# Patient Record
Sex: Male | Born: 1970 | Race: White | Hispanic: No | Marital: Married | Smoking: Former smoker
Health system: Southern US, Community
[De-identification: ages and names within clinical notes are randomized; demographics above are authoritative.]

## PROBLEM LIST (undated history)

## (undated) DIAGNOSIS — M47819 Spondylosis without myelopathy or radiculopathy, site unspecified: Secondary | ICD-10-CM

## (undated) DIAGNOSIS — J45991 Cough variant asthma: Secondary | ICD-10-CM

## (undated) DIAGNOSIS — K760 Fatty (change of) liver, not elsewhere classified: Secondary | ICD-10-CM

## (undated) DIAGNOSIS — I10 Essential (primary) hypertension: Secondary | ICD-10-CM

## (undated) DIAGNOSIS — L509 Urticaria, unspecified: Secondary | ICD-10-CM

## (undated) DIAGNOSIS — I499 Cardiac arrhythmia, unspecified: Secondary | ICD-10-CM

## (undated) DIAGNOSIS — G4730 Sleep apnea, unspecified: Secondary | ICD-10-CM

## (undated) DIAGNOSIS — K5790 Diverticulosis of intestine, part unspecified, without perforation or abscess without bleeding: Secondary | ICD-10-CM

## (undated) DIAGNOSIS — J189 Pneumonia, unspecified organism: Secondary | ICD-10-CM

## (undated) DIAGNOSIS — K219 Gastro-esophageal reflux disease without esophagitis: Secondary | ICD-10-CM

## (undated) DIAGNOSIS — N419 Inflammatory disease of prostate, unspecified: Secondary | ICD-10-CM

## (undated) DIAGNOSIS — R251 Tremor, unspecified: Secondary | ICD-10-CM

## (undated) DIAGNOSIS — G43909 Migraine, unspecified, not intractable, without status migrainosus: Secondary | ICD-10-CM

## (undated) DIAGNOSIS — J302 Other seasonal allergic rhinitis: Secondary | ICD-10-CM

## (undated) DIAGNOSIS — M1990 Unspecified osteoarthritis, unspecified site: Secondary | ICD-10-CM

## (undated) HISTORY — DX: Spondylosis without myelopathy or radiculopathy, site unspecified: M47.819

## (undated) HISTORY — DX: Other seasonal allergic rhinitis: J30.2

## (undated) HISTORY — DX: Migraine, unspecified, not intractable, without status migrainosus: G43.909

## (undated) HISTORY — DX: Essential (primary) hypertension: I10

## (undated) HISTORY — DX: Cardiac arrhythmia, unspecified: I49.9

## (undated) HISTORY — PX: COLON SURGERY: SHX602

## (undated) HISTORY — DX: Cough variant asthma: J45.991

## (undated) HISTORY — DX: Fatty (change of) liver, not elsewhere classified: K76.0

## (undated) HISTORY — DX: Sleep apnea, unspecified: G47.30

## (undated) HISTORY — DX: Unspecified osteoarthritis, unspecified site: M19.90

## (undated) HISTORY — PX: SINOSCOPY: SHX187

## (undated) HISTORY — DX: Tremor, unspecified: R25.1

## (undated) HISTORY — PX: HERNIA REPAIR: SHX51

## (undated) HISTORY — DX: Urticaria, unspecified: L50.9

## (undated) HISTORY — DX: Inflammatory disease of prostate, unspecified: N41.9

---
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
## 1998-12-29 ENCOUNTER — Emergency Department (HOSPITAL_COMMUNITY): Admission: EM | Admit: 1998-12-29 | Discharge: 1998-12-29 | Payer: Self-pay | Admitting: Emergency Medicine

## 1999-12-23 ENCOUNTER — Ambulatory Visit (HOSPITAL_COMMUNITY): Admission: RE | Admit: 1999-12-23 | Discharge: 1999-12-23 | Payer: Self-pay | Admitting: Emergency Medicine

## 1999-12-23 ENCOUNTER — Encounter: Payer: Self-pay | Admitting: Emergency Medicine

## 2000-06-12 ENCOUNTER — Emergency Department (HOSPITAL_COMMUNITY): Admission: EM | Admit: 2000-06-12 | Discharge: 2000-06-12 | Payer: Self-pay | Admitting: Emergency Medicine

## 2001-01-09 ENCOUNTER — Emergency Department (HOSPITAL_COMMUNITY): Admission: EM | Admit: 2001-01-09 | Discharge: 2001-01-09 | Payer: Self-pay | Admitting: Emergency Medicine

## 2001-01-09 ENCOUNTER — Encounter: Payer: Self-pay | Admitting: Emergency Medicine

## 2002-06-18 ENCOUNTER — Emergency Department (HOSPITAL_COMMUNITY): Admission: EM | Admit: 2002-06-18 | Discharge: 2002-06-19 | Payer: Self-pay | Admitting: Emergency Medicine

## 2004-01-26 ENCOUNTER — Emergency Department (HOSPITAL_COMMUNITY): Admission: EM | Admit: 2004-01-26 | Discharge: 2004-01-26 | Payer: Self-pay | Admitting: Emergency Medicine

## 2004-11-30 ENCOUNTER — Encounter: Admission: RE | Admit: 2004-11-30 | Discharge: 2004-11-30 | Payer: Self-pay | Admitting: Otolaryngology

## 2006-09-29 ENCOUNTER — Encounter: Admission: RE | Admit: 2006-09-29 | Discharge: 2006-09-29 | Payer: Self-pay | Admitting: Neurology

## 2007-01-19 ENCOUNTER — Encounter: Admission: RE | Admit: 2007-01-19 | Discharge: 2007-01-19 | Payer: Self-pay | Admitting: Neurology

## 2007-07-14 ENCOUNTER — Emergency Department (HOSPITAL_COMMUNITY): Admission: EM | Admit: 2007-07-14 | Discharge: 2007-07-14 | Payer: Self-pay | Admitting: Family Medicine

## 2007-10-29 ENCOUNTER — Encounter: Admission: RE | Admit: 2007-10-29 | Discharge: 2007-10-29 | Payer: Self-pay | Admitting: Neurology

## 2007-12-21 HISTORY — PX: ROTATOR CUFF REPAIR: SHX139

## 2007-12-30 ENCOUNTER — Encounter: Admission: RE | Admit: 2007-12-30 | Discharge: 2007-12-30 | Payer: Self-pay | Admitting: Neurology

## 2008-01-11 ENCOUNTER — Emergency Department (HOSPITAL_COMMUNITY): Admission: EM | Admit: 2008-01-11 | Discharge: 2008-01-11 | Payer: Self-pay | Admitting: Family Medicine

## 2008-07-14 ENCOUNTER — Emergency Department (HOSPITAL_COMMUNITY): Admission: EM | Admit: 2008-07-14 | Discharge: 2008-07-15 | Payer: Self-pay | Admitting: Emergency Medicine

## 2008-09-26 ENCOUNTER — Encounter: Payer: Self-pay | Admitting: Cardiology

## 2008-09-29 ENCOUNTER — Emergency Department (HOSPITAL_COMMUNITY): Admission: EM | Admit: 2008-09-29 | Discharge: 2008-09-29 | Payer: Self-pay | Admitting: Family Medicine

## 2009-02-15 ENCOUNTER — Encounter: Admission: RE | Admit: 2009-02-15 | Discharge: 2009-02-15 | Payer: Self-pay | Admitting: Specialist

## 2009-09-29 ENCOUNTER — Encounter: Payer: Self-pay | Admitting: Cardiology

## 2010-04-18 ENCOUNTER — Emergency Department (HOSPITAL_BASED_OUTPATIENT_CLINIC_OR_DEPARTMENT_OTHER): Admission: EM | Admit: 2010-04-18 | Discharge: 2010-04-19 | Payer: Self-pay | Admitting: Emergency Medicine

## 2010-11-13 ENCOUNTER — Emergency Department (HOSPITAL_COMMUNITY): Admission: EM | Admit: 2010-11-13 | Discharge: 2010-11-13 | Payer: Self-pay | Admitting: Family Medicine

## 2011-05-12 ENCOUNTER — Other Ambulatory Visit: Payer: Self-pay | Admitting: Cardiology

## 2011-05-13 ENCOUNTER — Other Ambulatory Visit: Payer: Self-pay | Admitting: *Deleted

## 2011-05-13 MED ORDER — HYDROCHLOROTHIAZIDE 25 MG PO TABS: 25.0000 mg | ORAL_TABLET | Freq: Every day | ORAL | Status: DC

## 2011-05-13 NOTE — Telephone Encounter (Signed)
Refill done.  

## 2011-07-20 ENCOUNTER — Encounter (INDEPENDENT_AMBULATORY_CARE_PROVIDER_SITE_OTHER): Payer: Self-pay | Admitting: Surgery

## 2011-07-26 ENCOUNTER — Ambulatory Visit (INDEPENDENT_AMBULATORY_CARE_PROVIDER_SITE_OTHER): Payer: PRIVATE HEALTH INSURANCE | Admitting: Surgery

## 2011-07-26 ENCOUNTER — Encounter (INDEPENDENT_AMBULATORY_CARE_PROVIDER_SITE_OTHER): Payer: Self-pay | Admitting: Surgery

## 2011-07-26 VITALS — BP 146/104 | HR 72 | Temp 97.8°F | Ht 70.0 in | Wt 218.2 lb

## 2011-07-26 DIAGNOSIS — K429 Umbilical hernia without obstruction or gangrene: Secondary | ICD-10-CM

## 2011-07-26 NOTE — Progress Notes (Signed)
Jordan Buchanan is a 40 y.o. male.    Chief Complaint  Patient presents with  . Umbilical Hernia    HPI HPI This is a 40 year old gentleman who presents with an umbilical hernia. He has had a hernia for approximately 5 years. It is now coming increasingly symptomatic. He reports occasional sharp pain at the umbilicus. He has had no deep abdominal pain. He has had no obstructive symptoms.  Past Medical History  Diagnosis Date  . Umbilical hernia   . Hypertension   . Generalized headaches   . Migraines   . Allergy   . Arthritis   . LVH (left ventricular hypertrophy)   . Spine disorder     Past Surgical History  Procedure Date  . Rotator cuff repair     left    Family History  Problem Relation Age of Onset  . Asthma Father     Social History History  Substance Use Topics  . Smoking status: Former Games developer  . Smokeless tobacco: Not on file  . Alcohol Use: No    No Known Allergies  Current Outpatient Prescriptions  Medication Sig Dispense Refill  . adalimumab (HUMIRA) 40 MG/0.8ML injection Inject 40 mg into the skin every 14 (fourteen) days.        . metoprolol succinate (TOPROL-XL) 25 MG 24 hr tablet Take 25 mg by mouth daily.        . Omeprazole (PRILOSEC PO) Take by mouth daily.        . hydrochlorothiazide 25 MG tablet TAKE 1 TABLET BY MOUTH EVERY MORNING  30 tablet  1    Review of Systems Review of Systems  Constitutional: Negative.   HENT: Negative.   Eyes: Negative.   Respiratory: Negative.   Cardiovascular: Negative.   Gastrointestinal: Negative.   Genitourinary: Negative.   Musculoskeletal: Negative.   Skin: Negative.   Neurological: Negative.   Endo/Heme/Allergies: Negative.   Psychiatric/Behavioral: Negative.     Physical Exam Physical Exam  Constitutional: He is oriented to person, place, and time. He appears well-developed and well-nourished. No distress.  HENT:  Head: Normocephalic and atraumatic.  Right Ear: External ear normal.  Left  Ear: External ear normal.  Nose: Nose normal.  Mouth/Throat: Oropharynx is clear and moist. No oropharyngeal exudate.  Eyes: Conjunctivae are normal. Pupils are equal, round, and reactive to light.  Neck: No tracheal deviation present. No thyromegaly present.  Cardiovascular: Normal rate, regular rhythm, normal heart sounds and intact distal pulses.   No murmur heard. Respiratory: Effort normal and breath sounds normal. No respiratory distress. He has no wheezes.  GI: Soft. Bowel sounds are normal. A hernia is present. Hernia confirmed positive in the ventral area.  Musculoskeletal: Normal range of motion. He exhibits no edema and no tenderness.  Lymphadenopathy:    He has no cervical adenopathy.  Neurological: He is alert and oriented to person, place, and time.  Skin: Skin is warm and dry. No rash noted. No erythema.  Psychiatric: His behavior is normal. Judgment normal.    The hernia at the umbilicus is chronically incarcerated with omentum. It is nontender Blood pressure 146/104, pulse 72, temperature 97.8 F (36.6 C), height 5\' 10"  (1.778 m), weight 218 lb 3.2 oz (98.975 kg).  Assessment/Plan This is a patient with a symptomatic umbilical hernia. Repair is recommended with possible mesh. I discussed this with him in detail. I discussed the risks of surgery which include but is not limited to bleeding, infection, recurrence, need for use of mesh, injury to  surrounding structures, etc. He understands and wishes to proceed.  [name] A 07/26/2011, 4:48 PM

## 2011-08-10 ENCOUNTER — Encounter: Payer: Self-pay | Admitting: Cardiology

## 2011-08-19 ENCOUNTER — Encounter (HOSPITAL_BASED_OUTPATIENT_CLINIC_OR_DEPARTMENT_OTHER): Payer: Self-pay | Admitting: *Deleted

## 2011-08-19 ENCOUNTER — Emergency Department (HOSPITAL_BASED_OUTPATIENT_CLINIC_OR_DEPARTMENT_OTHER)
Admission: EM | Admit: 2011-08-19 | Discharge: 2011-08-19 | Disposition: A | Discharge status: Home / Self Care | Payer: PRIVATE HEALTH INSURANCE | Attending: Emergency Medicine | Admitting: Emergency Medicine

## 2011-08-19 ENCOUNTER — Emergency Department (HOSPITAL_COMMUNITY)
Admission: EM | Admit: 2011-08-19 | Discharge: 2011-08-19 | Discharge status: Against Medical Advice | Payer: PRIVATE HEALTH INSURANCE | Attending: Emergency Medicine | Admitting: Emergency Medicine

## 2011-08-19 DIAGNOSIS — I10 Essential (primary) hypertension: Secondary | ICD-10-CM | POA: Insufficient documentation

## 2011-08-19 DIAGNOSIS — M549 Dorsalgia, unspecified: Secondary | ICD-10-CM | POA: Insufficient documentation

## 2011-08-19 MED ORDER — KETOROLAC TROMETHAMINE 60 MG/2ML IM SOLN
60.0000 mg | Freq: Once | INTRAMUSCULAR | Status: AC
  Administered 2011-08-19: 60 mg via INTRAMUSCULAR
  Filled 2011-08-19: qty 2

## 2011-08-19 MED ORDER — ONDANSETRON HCL 4 MG/2ML IJ SOLN
4.0000 mg | Freq: Once | INTRAMUSCULAR | Status: AC
  Administered 2011-08-19: 4 mg via INTRAMUSCULAR
  Filled 2011-08-19: qty 2

## 2011-08-19 MED ORDER — HYDROMORPHONE HCL 1 MG/ML IJ SOLN
2.0000 mg | Freq: Once | INTRAMUSCULAR | Status: AC
  Administered 2011-08-19: 2 mg via INTRAMUSCULAR
  Filled 2011-08-19: qty 2

## 2011-08-19 MED ORDER — OXYCODONE-ACETAMINOPHEN 5-325 MG PO TABS: 2.0000 | ORAL_TABLET | ORAL | Status: AC | PRN

## 2011-08-19 NOTE — ED Notes (Signed)
Pt with low back pain was seen by PCP last week and started on steroids pain radiates down right leg pt is waiting for a call back for MRI pt reports Vicodin is ineffective

## 2011-08-19 NOTE — ED Provider Notes (Signed)
History     CSN: [id number] Arrival date & time: 08/19/2011  9:01 PM  Chief Complaint  Patient presents with  . Back Pain   Patient is a 40 y.o. male presenting with back pain.  Back Pain  This is a new problem. The current episode started more than 1 week ago. The problem occurs constantly. The problem has not changed since onset.The pain is associated with no known injury. The pain is present in the lumbar spine. The quality of the pain is described as stabbing and shooting. The pain radiates to the right thigh. The pain is at a severity of 8/10. The pain is moderate. The pain is worse during the day. Stiffness is present in the morning. Associated symptoms include leg pain. He has tried nothing for the symptoms. The treatment provided moderate relief.    Past Medical History  Diagnosis Date  . Umbilical hernia   . Hypertension   . Generalized headaches   . Migraines   . Allergy   . Arthritis   . LVH (left ventricular hypertrophy)   . Spine disorder   . Drug abuse     History of previous drug abuse, without recurrence  . Arrhythmia     palpatations    Past Surgical History  Procedure Date  . Rotator cuff repair     left  . Other surgical history 1992    facial surgery    Family History  Problem Relation Age of Onset  . Asthma Father   . Hypertension Brother     History  Substance Use Topics  . Smoking status: Former Smoker    Quit date: 08/09/2001  . Smokeless tobacco: Not on file  . Alcohol Use: No      Review of Systems  Musculoskeletal: Positive for back pain.  All other systems reviewed and are negative.    Physical Exam  BP 135/95  Pulse 77  Temp(Src) 98.8 F (37.1 C) (Oral)  Resp 18  SpO2 97%  Physical Exam  Nursing note and vitals reviewed. Constitutional: He is oriented to person, place, and time. He appears well-developed and well-nourished.  HENT:  Head: Normocephalic and atraumatic.  Eyes: Conjunctivae and EOM are normal. Pupils are  equal, round, and reactive to light.  Neck: Normal range of motion. Neck supple.  Cardiovascular: Normal rate.   Pulmonary/Chest: Effort normal.  Abdominal: Soft.  Musculoskeletal: Normal range of motion.  Neurological: He is alert and oriented to person, place, and time.  Skin: Skin is warm and dry.  Psychiatric: He has a normal mood and affect.    ED Course  Procedures  MDM Pt reports he is suppose to have an mri this week.  Pt reports no relief from hydrocodone.       Langston Masker, [place] 08/20/11 1620

## 2011-09-01 ENCOUNTER — Ambulatory Visit (INDEPENDENT_AMBULATORY_CARE_PROVIDER_SITE_OTHER): Payer: PRIVATE HEALTH INSURANCE | Admitting: Cardiology

## 2011-09-01 ENCOUNTER — Encounter: Payer: Self-pay | Admitting: Cardiology

## 2011-09-01 DIAGNOSIS — I517 Cardiomegaly: Secondary | ICD-10-CM

## 2011-09-01 DIAGNOSIS — I10 Essential (primary) hypertension: Secondary | ICD-10-CM | POA: Insufficient documentation

## 2011-09-01 NOTE — Assessment & Plan Note (Signed)
Resolved by echocardiogram in 2010.

## 2011-09-01 NOTE — Assessment & Plan Note (Signed)
Blood pressures a little elevated today related to use of decongestants. I've cautioned him on avoidance of decongestants. Since he does not have any active cardiac disease I've recommended that he followup with his primary care physician for management of his hypertension. I will see him back as needed.

## 2011-09-01 NOTE — Progress Notes (Signed)
Eliot Ford Date of Birth: 27-Feb-1971   History of Present Illness: This patient is seen for followup evaluation of hypertension. He is a former patient of Dr. Deborah Chalk. He had a prior history of LVH but this resolved with treatment of his hypertension. He has no other cardiac disease. In 2006 he quit smoking in quit drinking alcohol. He was taking hydrochlorothiazide only but was started back on Toprol-XL. Since then he reports his blood pressure has been doing very well. He has had an upper respiratory infection this week and has been taking over-the-counter decongestants. He also has a history of some PVCs but is asymptomatic as long as he avoids caffeine.  Current Outpatient Prescriptions on File Prior to Visit  Medication Sig Dispense Refill  . adalimumab (HUMIRA) 40 MG/0.8ML injection Inject 40 mg into the skin every 14 (fourteen) days.        Marland Kitchen esomeprazole (NEXIUM) 40 MG capsule Take 40 mg by mouth daily.        . hydrochlorothiazide 25 MG tablet TAKE 1 TABLET BY MOUTH EVERY MORNING  30 tablet  1  . HYDROcodone-acetaminophen (VICODIN) 5-500 MG per tablet Take 1 tablet by mouth every 8 (eight) hours as needed. For pain        . metoprolol succinate (TOPROL-XL) 25 MG 24 hr tablet Take 25 mg by mouth daily.        . Multiple Vitamin (MULTI-VITAMIN PO) Take by mouth daily.        . Omega-3 Fatty Acids (FISH OIL) 1200 MG CAPS Take 1 capsule by mouth daily.        . fexofenadine (ALLEGRA) 180 MG tablet Take 180 mg by mouth daily.          No Known Allergies  Past Medical History  Diagnosis Date  . Umbilical hernia   . Hypertension   . Generalized headaches   . Migraines   . Allergy   . Arthritis   . LVH (left ventricular hypertrophy)   . Spine disorder   . Drug abuse     History of previous drug abuse, without recurrence  . Arrhythmia     palpatations  . Ankylosing spondylitis     Past Surgical History  Procedure Date  . Rotator cuff repair     left  . Other surgical  history 1992    facial surgery    History  Smoking status  . Former Smoker  . Quit date: 08/09/2001  Smokeless tobacco  . Not on file    History  Alcohol Use No    Family History  Problem Relation Age of Onset  . Asthma Father   . Hypertension Brother     Review of Systems: As noted in history of present illness.  All other systems were reviewed and are negative.  Physical Exam: BP 142/78  Pulse 90  Ht 5\' 10"  (1.778 m)  Wt 219 lb 3.2 oz (99.428 kg)  BMI 31.45 kg/m2 The patient is alert and oriented x 3.  The mood and affect are normal.  The skin is warm and dry.  Color is normal.  The HEENT exam reveals that the sclera are nonicteric.  The mucous membranes are moist.  The carotids are 2+ without bruits.  There is no thyromegaly.  There is no JVD.  The lungs are clear.  The chest wall is non tender.  The heart exam reveals a regular rate with a normal S1 and S2.  There are no murmurs, gallops, or rubs.  The PMI is not displaced.   Abdominal exam reveals good bowel sounds.  There is no guarding or rebound.  There is no hepatosplenomegaly or tenderness.  There are no masses.  Exam of the legs reveal no clubbing, cyanosis, or edema.  The legs are without rashes.  The distal pulses are intact.  Cranial nerves II - XII are intact.  Motor and sensory functions are intact.  The gait is normal.  LABORATORY DATA:   Assessment / Plan:

## 2011-09-01 NOTE — Patient Instructions (Signed)
Avoid decongestants.  OK to use cough suppressants or antihistamines.  Continue your current mediccations.  I will see you as needed.

## 2011-09-06 NOTE — ED Provider Notes (Signed)
Evaluation and management procedures were performed by the PA/NP under my supervision/collaboration.    Felisa Bonier, MD 09/06/11 1239

## 2011-09-17 LAB — POCT I-STAT, CHEM 8
BUN: 15
Hemoglobin: 15.3
TCO2: 21

## 2011-09-17 LAB — POCT CARDIAC MARKERS
CKMB, poc: <1 — ABNORMAL LOW
Troponin i, poc: <0.05

## 2011-09-20 LAB — POCT URINALYSIS DIP (DEVICE)
Bilirubin Urine: NEGATIVE
Ketones, ur: NEGATIVE
Operator id: 282151
Protein, ur: 30 — AB
Urobilinogen, UA: 1

## 2011-10-04 LAB — POCT RAPID STREP A: Streptococcus, Group A Screen (Direct): POSITIVE — AB

## 2011-12-23 ENCOUNTER — Other Ambulatory Visit: Payer: Self-pay | Admitting: Nurse Practitioner

## 2011-12-23 ENCOUNTER — Ambulatory Visit
Admission: RE | Admit: 2011-12-23 | Discharge: 2011-12-23 | Disposition: A | Discharge status: Home / Self Care | Payer: PRIVATE HEALTH INSURANCE | Source: Ambulatory Visit | Attending: Nurse Practitioner | Admitting: Nurse Practitioner

## 2011-12-23 DIAGNOSIS — R059 Cough, unspecified: Secondary | ICD-10-CM

## 2011-12-23 DIAGNOSIS — R05 Cough: Secondary | ICD-10-CM

## 2011-12-23 DIAGNOSIS — J029 Acute pharyngitis, unspecified: Secondary | ICD-10-CM

## 2012-01-18 ENCOUNTER — Other Ambulatory Visit: Payer: Self-pay | Admitting: *Deleted

## 2012-01-18 MED ORDER — HYDROCHLOROTHIAZIDE 25 MG PO TABS: 25.0000 mg | ORAL_TABLET | Freq: Every day | ORAL | Status: DC

## 2012-04-21 ENCOUNTER — Encounter (INDEPENDENT_AMBULATORY_CARE_PROVIDER_SITE_OTHER): Payer: Self-pay | Admitting: Surgery

## 2012-04-21 ENCOUNTER — Ambulatory Visit (INDEPENDENT_AMBULATORY_CARE_PROVIDER_SITE_OTHER): Payer: PRIVATE HEALTH INSURANCE | Admitting: Surgery

## 2012-04-21 VITALS — BP 130/98 | HR 72 | Temp 98.6°F | Resp 16 | Ht 70.0 in | Wt 218.0 lb

## 2012-04-21 DIAGNOSIS — K429 Umbilical hernia without obstruction or gangrene: Secondary | ICD-10-CM

## 2012-04-21 NOTE — Progress Notes (Signed)
Subjective:     Patient ID: Jordan Buchanan, male   DOB: 1971/07/29, 41 y.o.   MRN: [id number]  HPI This is a gentleman I originally saw in August for an umbilical hernia. We have tentatively schedule him for umbilical hernia repair with mesh but he had canceled surgery. Again, he has had a hernia for some time. He is now rate schedule surgery. It has not changed in size it has caused some slight more discomfort.  Review of Systems     Objective:   Physical Exam    On exam he has an easily reducible umbilical hernia Assessment:     Umbilical hernia    Plan:     Repair with mesh was recommended. I again discussed this with him in detail. I discussed the risk of surgery in detail. These risks includes but is not limited to bleeding, infection, injury to surrounding structures, need to remove the mesh had become infected, recurrence, etc. He understands and wishes to proceed. Likely of success is good

## 2012-05-01 ENCOUNTER — Encounter (INDEPENDENT_AMBULATORY_CARE_PROVIDER_SITE_OTHER): Payer: PRIVATE HEALTH INSURANCE | Admitting: Surgery

## 2012-05-03 ENCOUNTER — Encounter (HOSPITAL_BASED_OUTPATIENT_CLINIC_OR_DEPARTMENT_OTHER): Payer: Self-pay | Admitting: *Deleted

## 2012-05-09 ENCOUNTER — Encounter (HOSPITAL_BASED_OUTPATIENT_CLINIC_OR_DEPARTMENT_OTHER)
Admission: RE | Admit: 2012-05-09 | Discharge: 2012-05-09 | Disposition: A | Discharge status: Home / Self Care | Payer: PRIVATE HEALTH INSURANCE | Source: Ambulatory Visit | Attending: Surgery | Admitting: Surgery

## 2012-05-09 LAB — BASIC METABOLIC PANEL
CO2: 27 mEq/L (ref 19–32)
Calcium: 10 mg/dL (ref 8.4–10.5)
Creatinine, Ser: 0.81 mg/dL (ref 0.50–1.35)
Glucose, Bld: 82 mg/dL (ref 70–99)

## 2012-05-09 NOTE — H&P (Signed)
Jordan Buchanan is a 41 y.o. male.  Chief Complaint   Patient presents with   .  Umbilical Hernia    HPI  HPI  This is a 41 year old gentleman who presents with an umbilical hernia. He has had a hernia for approximately 5 years. It is now coming increasingly symptomatic. He reports occasional sharp pain at the umbilicus. He has had no deep abdominal pain. He has had no obstructive symptoms.  Past Medical History   Diagnosis  Date   .  Umbilical hernia    .  Hypertension    .  Generalized headaches    .  Migraines    .  Allergy    .  Arthritis    .  LVH (left ventricular hypertrophy)    .  Spine disorder     Past Surgical History   Procedure  Date   .  Rotator cuff repair      left    Family History   Problem  Relation  Age of Onset   .  Asthma  Father     Social History  History   Substance Use Topics   .  Smoking status:  Former Games developer   .  Smokeless tobacco:  Not on file   .  Alcohol Use:  No    No Known Allergies  Current Outpatient Prescriptions   Medication  Sig  Dispense  Refill   .  adalimumab (HUMIRA) 40 MG/0.8ML injection  Inject 40 mg into the skin every 14 (fourteen) days.     .  metoprolol succinate (TOPROL-XL) 25 MG 24 hr tablet  Take 25 mg by mouth daily.     .  Omeprazole (PRILOSEC PO)  Take by mouth daily.     .  hydrochlorothiazide 25 MG tablet  TAKE 1 TABLET BY MOUTH EVERY MORNING  30 tablet  1    Review of Systems  Review of Systems  Constitutional: Negative.  HENT: Negative.  Eyes: Negative.  Respiratory: Negative.  Cardiovascular: Negative.  Gastrointestinal: Negative.  Genitourinary: Negative.  Musculoskeletal: Negative.  Skin: Negative.  Neurological: Negative.  Endo/Heme/Allergies: Negative.  Psychiatric/Behavioral: Negative.   Physical Exam  Physical Exam  Constitutional: He is oriented to person, place, and time. He appears well-developed and well-nourished. No distress.  HENT:  Head: Normocephalic and atraumatic.  Right Ear:  External ear normal.  Left Ear: External ear normal.  Nose: Nose normal.  Mouth/Throat: Oropharynx is clear and moist. No oropharyngeal exudate.  Eyes: Conjunctivae are normal. Pupils are equal, round, and reactive to light.  Neck: No tracheal deviation present. No thyromegaly present.  Cardiovascular: Normal rate, regular rhythm, normal heart sounds and intact distal pulses.  No murmur heard.  Respiratory: Effort normal and breath sounds normal. No respiratory distress. He has no wheezes.  GI: Soft. Bowel sounds are normal. A hernia is present. Hernia confirmed positive in the ventral area.  Musculoskeletal: Normal range of motion. He exhibits no edema and no tenderness.  Lymphadenopathy:  He has no cervical adenopathy.  Neurological: He is alert and oriented to person, place, and time.  Skin: Skin is warm and dry. No rash noted. No erythema.  Psychiatric: His behavior is normal. Judgment normal.   The hernia at the umbilicus is chronically incarcerated with omentum. It is nontender  Blood pressure 146/104, pulse 72, temperature 97.8 F (36.6 C), height 5\' 10"  (1.778 m), weight 218 lb 3.2 oz (98.975 kg).  Assessment/Plan  This is a patient with a symptomatic umbilical  hernia. Repair is recommended with possible mesh. I discussed this with him in detail. I discussed the risks of surgery which include but is not limited to bleeding, infection, recurrence, need for use of mesh, injury to surrounding structures, etc. He understands and wishes to proceed.  Deseri Loss A

## 2012-05-10 ENCOUNTER — Ambulatory Visit (HOSPITAL_BASED_OUTPATIENT_CLINIC_OR_DEPARTMENT_OTHER): Payer: PRIVATE HEALTH INSURANCE | Admitting: Certified Registered"

## 2012-05-10 ENCOUNTER — Encounter (HOSPITAL_BASED_OUTPATIENT_CLINIC_OR_DEPARTMENT_OTHER): Payer: Self-pay | Admitting: *Deleted

## 2012-05-10 ENCOUNTER — Encounter (HOSPITAL_BASED_OUTPATIENT_CLINIC_OR_DEPARTMENT_OTHER): Payer: Self-pay | Admitting: Certified Registered"

## 2012-05-10 ENCOUNTER — Encounter (HOSPITAL_BASED_OUTPATIENT_CLINIC_OR_DEPARTMENT_OTHER)
Admission: RE | Disposition: A | Discharge status: Home / Self Care | Payer: Self-pay | Source: Ambulatory Visit | Attending: Surgery

## 2012-05-10 ENCOUNTER — Ambulatory Visit (HOSPITAL_BASED_OUTPATIENT_CLINIC_OR_DEPARTMENT_OTHER)
Admission: RE | Admit: 2012-05-10 | Discharge: 2012-05-10 | Disposition: A | Discharge status: Home / Self Care | Payer: PRIVATE HEALTH INSURANCE | Source: Ambulatory Visit | Attending: Surgery | Admitting: Surgery

## 2012-05-10 DIAGNOSIS — Z01810 Encounter for preprocedural cardiovascular examination: Secondary | ICD-10-CM | POA: Insufficient documentation

## 2012-05-10 DIAGNOSIS — Z01812 Encounter for preprocedural laboratory examination: Secondary | ICD-10-CM | POA: Insufficient documentation

## 2012-05-10 DIAGNOSIS — K429 Umbilical hernia without obstruction or gangrene: Secondary | ICD-10-CM

## 2012-05-10 DIAGNOSIS — I10 Essential (primary) hypertension: Secondary | ICD-10-CM | POA: Insufficient documentation

## 2012-05-10 HISTORY — DX: Gastro-esophageal reflux disease without esophagitis: K21.9

## 2012-05-10 HISTORY — PX: UMBILICAL HERNIA REPAIR: SHX196

## 2012-05-10 LAB — POCT HEMOGLOBIN-HEMACUE: Hemoglobin: 16 g/dL (ref 13.0–17.0)

## 2012-05-10 SURGERY — REPAIR, HERNIA, UMBILICAL, ADULT
Anesthesia: General | Site: Abdomen | Wound class: Clean

## 2012-05-10 MED ORDER — MORPHINE SULFATE 2 MG/ML IJ SOLN: 2.0000 mg | INTRAMUSCULAR | Status: DC | PRN

## 2012-05-10 MED ORDER — FENTANYL CITRATE 0.05 MG/ML IJ SOLN: 25.0000 ug | INTRAMUSCULAR | Status: DC | PRN

## 2012-05-10 MED ORDER — CEFAZOLIN SODIUM-DEXTROSE 2-3 GM-% IV SOLR
2.0000 g | INTRAVENOUS | Status: AC
  Administered 2012-05-10: 2 g via INTRAVENOUS

## 2012-05-10 MED ORDER — SODIUM CHLORIDE 0.9 % IJ SOLN: 3.0000 mL | Freq: Two times a day (BID) | INTRAMUSCULAR | Status: DC

## 2012-05-10 MED ORDER — FENTANYL CITRATE 0.05 MG/ML IJ SOLN
INTRAMUSCULAR | Status: DC | PRN
  Administered 2012-05-10: 100 ug via INTRAVENOUS

## 2012-05-10 MED ORDER — PROPOFOL 10 MG/ML IV EMUL
INTRAVENOUS | Status: DC | PRN
  Administered 2012-05-10: 250 mg via INTRAVENOUS

## 2012-05-10 MED ORDER — SODIUM CHLORIDE 0.9 % IV SOLN: 250.0000 mL | INTRAVENOUS | Status: DC | PRN

## 2012-05-10 MED ORDER — CEFAZOLIN SODIUM 1-5 GM-% IV SOLN: 1.0000 g | INTRAVENOUS | Status: DC

## 2012-05-10 MED ORDER — MIDAZOLAM HCL 5 MG/5ML IJ SOLN
INTRAMUSCULAR | Status: DC | PRN
  Administered 2012-05-10: 2 mg via INTRAVENOUS

## 2012-05-10 MED ORDER — METOCLOPRAMIDE HCL 5 MG/ML IJ SOLN: 10.0000 mg | Freq: Once | INTRAMUSCULAR | Status: DC | PRN

## 2012-05-10 MED ORDER — ONDANSETRON HCL 4 MG/2ML IJ SOLN: 4.0000 mg | Freq: Four times a day (QID) | INTRAMUSCULAR | Status: DC | PRN

## 2012-05-10 MED ORDER — KETOROLAC TROMETHAMINE 30 MG/ML IJ SOLN
INTRAMUSCULAR | Status: DC | PRN
  Administered 2012-05-10: 30 mg via INTRAVENOUS

## 2012-05-10 MED ORDER — MIDAZOLAM HCL 2 MG/2ML IJ SOLN: 0.5000 mg | INTRAMUSCULAR | Status: DC | PRN

## 2012-05-10 MED ORDER — METOCLOPRAMIDE HCL 5 MG/ML IJ SOLN
INTRAMUSCULAR | Status: DC | PRN
  Administered 2012-05-10: 10 mg via INTRAVENOUS

## 2012-05-10 MED ORDER — SODIUM CHLORIDE 0.9 % IJ SOLN: 3.0000 mL | INTRAMUSCULAR | Status: DC | PRN

## 2012-05-10 MED ORDER — ACETAMINOPHEN 650 MG RE SUPP: 650.0000 mg | RECTAL | Status: DC | PRN

## 2012-05-10 MED ORDER — HYDROCODONE-ACETAMINOPHEN 5-325 MG PO TABS: 1.0000 | ORAL_TABLET | ORAL | Status: AC | PRN

## 2012-05-10 MED ORDER — LIDOCAINE HCL (CARDIAC) 20 MG/ML IV SOLN
INTRAVENOUS | Status: DC | PRN
  Administered 2012-05-10: 100 mg via INTRAVENOUS

## 2012-05-10 MED ORDER — BUPIVACAINE HCL 0.5 % IJ SOLN
INTRAMUSCULAR | Status: DC | PRN
  Administered 2012-05-10: 20 mL

## 2012-05-10 MED ORDER — DEXAMETHASONE SODIUM PHOSPHATE 4 MG/ML IJ SOLN
INTRAMUSCULAR | Status: DC | PRN
  Administered 2012-05-10: 4 mg via INTRAVENOUS
  Administered 2012-05-10: 10 mg via INTRAVENOUS

## 2012-05-10 MED ORDER — ACETAMINOPHEN 10 MG/ML IV SOLN
1000.0000 mg | Freq: Once | INTRAVENOUS | Status: AC
  Administered 2012-05-10: 1000 mg via INTRAVENOUS

## 2012-05-10 MED ORDER — ACETAMINOPHEN 325 MG PO TABS: 650.0000 mg | ORAL_TABLET | ORAL | Status: DC | PRN

## 2012-05-10 MED ORDER — OXYCODONE HCL 5 MG PO TABS: 5.0000 mg | ORAL_TABLET | ORAL | Status: DC | PRN

## 2012-05-10 MED ORDER — LACTATED RINGERS IV SOLN
INTRAVENOUS | Status: DC
  Administered 2012-05-10 (×2): via INTRAVENOUS

## 2012-05-10 SURGICAL SUPPLY — 42 items
BENZOIN TINCTURE PRP APPL 2/3 (GAUZE/BANDAGES/DRESSINGS) IMPLANT
BLADE SURG 15 STRL LF DISP TIS (BLADE) ×1 IMPLANT
BLADE SURG 15 STRL SS (BLADE) ×1
BLADE SURG ROTATE 9660 (MISCELLANEOUS) IMPLANT
CANISTER SUCTION 1200CC (MISCELLANEOUS) IMPLANT
CHLORAPREP W/TINT 26ML (MISCELLANEOUS) ×2 IMPLANT
CLEANER CAUTERY TIP 5X5 PAD (MISCELLANEOUS) ×1 IMPLANT
CLOTH BEACON ORANGE TIMEOUT ST (SAFETY) ×2 IMPLANT
COVER MAYO STAND STRL (DRAPES) ×2 IMPLANT
COVER TABLE BACK 60X90 (DRAPES) ×2 IMPLANT
DECANTER SPIKE VIAL GLASS SM (MISCELLANEOUS) IMPLANT
DRAPE PED LAPAROTOMY (DRAPES) ×2 IMPLANT
DRAPE UTILITY XL STRL (DRAPES) ×2 IMPLANT
DRSG TEGADERM 2-3/8X2-3/4 SM (GAUZE/BANDAGES/DRESSINGS) ×2 IMPLANT
DRSG TEGADERM 4X4.75 (GAUZE/BANDAGES/DRESSINGS) IMPLANT
ELECT REM PT RETURN 9FT ADLT (ELECTROSURGICAL) ×2
ELECTRODE REM PT RTRN 9FT ADLT (ELECTROSURGICAL) ×1 IMPLANT
GLOVE SURG SIGNA 7.5 PF LTX (GLOVE) ×2 IMPLANT
GOWN PREVENTION PLUS XLARGE (GOWN DISPOSABLE) IMPLANT
GOWN PREVENTION PLUS XXLARGE (GOWN DISPOSABLE) IMPLANT
NEEDLE HYPO 25X1 1.5 SAFETY (NEEDLE) ×2 IMPLANT
NS IRRIG 1000ML POUR BTL (IV SOLUTION) ×2 IMPLANT
PACK BASIN DAY SURGERY FS (CUSTOM PROCEDURE TRAY) ×2 IMPLANT
PAD CLEANER CAUTERY TIP 5X5 (MISCELLANEOUS) ×1
PATCH VENTRAL SMALL 4.3 (Mesh Specialty) ×2 IMPLANT
PENCIL BUTTON HOLSTER BLD 10FT (ELECTRODE) ×2 IMPLANT
SLEEVE SCD COMPRESS KNEE MED (MISCELLANEOUS) ×2 IMPLANT
SPONGE GAUZE 2X2 8PLY STRL LF (GAUZE/BANDAGES/DRESSINGS) ×2 IMPLANT
SPONGE LAP 4X18 X RAY DECT (DISPOSABLE) ×2 IMPLANT
STRIP CLOSURE SKIN 1/2X4 (GAUZE/BANDAGES/DRESSINGS) ×2 IMPLANT
SUT MNCRL AB 4-0 PS2 18 (SUTURE) ×2 IMPLANT
SUT NOVA NAB DX-16 0-1 5-0 T12 (SUTURE) ×2 IMPLANT
SUT VIC AB 2-0 SH 27 (SUTURE)
SUT VIC AB 2-0 SH 27XBRD (SUTURE) IMPLANT
SUT VIC AB 3-0 SH 27 (SUTURE) ×2
SUT VIC AB 3-0 SH 27X BRD (SUTURE) ×2 IMPLANT
SYR CONTROL 10ML LL (SYRINGE) ×2 IMPLANT
TOWEL OR 17X24 6PK STRL BLUE (TOWEL DISPOSABLE) IMPLANT
TOWEL OR NON WOVEN STRL DISP B (DISPOSABLE) ×2 IMPLANT
TUBE CONNECTING 20X1/4 (TUBING) IMPLANT
WATER STERILE IRR 1000ML POUR (IV SOLUTION) IMPLANT
YANKAUER SUCT BULB TIP NO VENT (SUCTIONS) IMPLANT

## 2012-05-10 NOTE — Anesthesia Procedure Notes (Signed)
Procedure Name: LMA Insertion Date/Time: 05/10/2012 8:33 AM Performed by: Verlan Friends Pre-anesthesia Checklist: Patient identified, Emergency Drugs available, Suction available, Patient being monitored and Timeout performed Patient Re-evaluated:Patient Re-evaluated prior to inductionOxygen Delivery Method: Circle System Utilized Preoxygenation: Pre-oxygenation with 100% oxygen Intubation Type: IV induction Ventilation: Mask ventilation without difficulty LMA: LMA with gastric port inserted LMA Size: 4.0 Number of attempts: 1 Placement Confirmation: positive ETCO2 Tube secured with: Tape Dental Injury: Teeth and Oropharynx as per pre-operative assessment

## 2012-05-10 NOTE — Transfer of Care (Signed)
Immediate Anesthesia Transfer of Care Note  Patient: Jordan Buchanan  Procedure(s) Performed: Procedure(s) (LRB): HERNIA REPAIR UMBILICAL ADULT (N/A) INSERTION OF MESH (N/A)  Patient Location: PACU  Anesthesia Type: General  Level of Consciousness: awake, alert , oriented and patient cooperative  Airway & Oxygen Therapy: Patient Spontanous Breathing and Patient connected to face mask oxygen  Post-op Assessment: Report given to PACU RN and Post -op Vital signs reviewed and stable  Post vital signs: Reviewed and stable  Complications: No apparent anesthesia complications

## 2012-05-10 NOTE — Anesthesia Preprocedure Evaluation (Signed)
Anesthesia Evaluation  Patient identified by MRN, date of birth, ID band Patient awake    Reviewed: Allergy & Precautions, H&P , NPO status , Patient's Chart, lab work & pertinent test results, reviewed documented beta blocker date and time   Airway Mallampati: II TM Distance: >3 FB Neck ROM: full    Dental   Pulmonary neg pulmonary ROS,          Cardiovascular hypertension, On Medications and On Home Beta Blockers + dysrhythmias     Neuro/Psych  Headaches, negative neurological ROS  negative psych ROS   GI/Hepatic Neg liver ROS, GERD-  Medicated and Controlled,(+)     substance abuse   ,   Endo/Other  negative endocrine ROS  Renal/GU negative Renal ROS  negative genitourinary   Musculoskeletal   Abdominal   Peds  Hematology negative hematology ROS (+)   Anesthesia Other Findings See surgeon's H&P   Reproductive/Obstetrics negative OB ROS                           Anesthesia Physical Anesthesia Plan  ASA: III  Anesthesia Plan: General   Post-op Pain Management:    Induction: Intravenous  Airway Management Planned: LMA  Additional Equipment:   Intra-op Plan:   Post-operative Plan: Extubation in OR  Informed Consent: I have reviewed the patients History and Physical, chart, labs and discussed the procedure including the risks, benefits and alternatives for the proposed anesthesia with the patient or authorized representative who has indicated his/her understanding and acceptance.   Dental Advisory Given  Plan Discussed with: CRNA and Surgeon  Anesthesia Plan Comments:         Anesthesia Quick Evaluation

## 2012-05-10 NOTE — Anesthesia Postprocedure Evaluation (Signed)
Anesthesia Post Note  Patient: Jordan Buchanan  Procedure(s) Performed: Procedure(s) (LRB): HERNIA REPAIR UMBILICAL ADULT (N/A) INSERTION OF MESH (N/A)  Anesthesia type: General  Patient location: PACU  Post pain: Pain level controlled  Post assessment: Patient's Cardiovascular Status Stable  Last Vitals:  Filed Vitals:   05/10/12 1000  BP: 119/80  Pulse: 74  Temp: 36.5 C  Resp: 17    Post vital signs: Reviewed and stable  Level of consciousness: alert  Complications: No apparent anesthesia complications

## 2012-05-10 NOTE — Op Note (Signed)
HERNIA REPAIR UMBILICAL ADULT, INSERTION OF MESH  Procedure Note  Javonni Macke 05/10/2012   Pre-op Diagnosis: umbilical hernia     Post-op Diagnosis: same  Procedure(s): HERNIA REPAIR UMBILICAL ADULT INSERTION OF MESH  Surgeon(s): Shelly Rubenstein, MD  Anesthesia: General  Staff:  Joylene Grapes, RN - Circulator Idell Pickles, CST - Scrub Person  Estimated Blood Loss: Minimal               Procedure: The patient was brought to the operating room and identified as the correct patient. He was placed supine on the operating room table and general anesthesia was induced. His abdomen was then prepped and draped in the usual sterile fashion. I anesthetized the skin with Marcaine.  I then made a small infraumbilical transverse incision with the scalpel. I took this down to the hernia sac and fascia with the cautery. I separated the hernia sac from the overlying umbilical skin and excised the sac in its entirety. The omentum that was in the sac it already been reduced. The fascial defect was quite small. I brought a 4.3 cm round patch of mesh onto the field. This was placed through the fascial opening and then pulled up taut to the peritoneum with the stay ties. I then sewed it in place circumferentially with interrupted #1 Novafil pop off sutures. I then closed the fascia over the top of the mesh with a figure-of-eight #1 Novafil suture. Good closure of the fascial defect appeared to be achieved. I anesthetized the fascia with Marcaine. I then closed the subcutaneous tissue with interrupted 3-0 Vicryl sutures and closed the skin with a running 4-0 Monocryl. Steri-Strips, gauze, and Tegaderm were then applied. The patient tolerated the procedure well. All the counts were correct at the end of the procedure. The patient was then extubated in the operating room and taken in a stable condition to the recovery room.          Dorean Hiebert A   Date: 05/10/2012  Time: 9:03 AM

## 2012-05-10 NOTE — Interval H&P Note (Signed)
History and Physical Interval Note:  He has had no change in his history or exam  05/10/2012 7:26 AM  Jordan Buchanan  has presented today for surgery, with the diagnosis of umbilical hernia  The various methods of treatment have been discussed with the patient and family. After consideration of risks, benefits and other options for treatment, the patient has consented to  Procedure(s) (LRB): HERNIA REPAIR UMBILICAL ADULT (N/A) INSERTION OF MESH (N/A) as a surgical intervention .  The patients' history has been reviewed, patient examined, no change in status, stable for surgery.  I have reviewed the patients' chart and labs.  Questions were answered to the patient's satisfaction.     Jordan Buchanan A

## 2012-05-10 NOTE — Discharge Instructions (Signed)
[facility] _______[facility]  UMBILICAL OR INGUINAL HERNIA REPAIR: POST OP INSTRUCTIONS  Always review your discharge instruction sheet given to you by the facility where your surgery was performed. IF YOU HAVE DISABILITY OR FAMILY LEAVE FORMS, YOU MUST BRING THEM TO THE OFFICE FOR PROCESSING.   DO NOT GIVE THEM TO YOUR DOCTOR.  1. A  prescription for pain medication may be given to you upon discharge.  Take your pain medication as prescribed, if needed.  If narcotic pain medicine is not needed, then you may take acetaminophen (Tylenol) or ibuprofen (Advil) as needed. 2. Take your usually prescribed medications unless otherwise directed. 3. If you need a refill on your pain medication, please contact your pharmacy.  They will contact our office to request authorization. Prescriptions will not be filled after 5 pm or on week-ends. 4. You should follow a light diet the first 24 hours after arrival home, such as soup and crackers, etc.  Be sure to include lots of fluids daily.  Resume your normal diet the day after surgery. 5. Most patients will experience some swelling and bruising around the umbilicus or in the groin and scrotum.  Ice packs and reclining will help.  Swelling and bruising can take several days to resolve.  6. It is common to experience some constipation if taking pain medication after surgery.  Increasing fluid intake and taking a stool softener (such as Colace) will usually help or prevent this problem from occurring.  A mild laxative (Milk of Magnesia or Miralax) should be taken according to package directions if there are no bowel movements after 48 hours. 7. Unless discharge instructions indicate otherwise, you may remove your bandages 24-48 hours after surgery, and you may shower at that time.  You may have steri-strips (small skin tapes) in place directly over the incision.  These strips should be left on the skin for 7-10 days.  If your surgeon used skin glue on the  incision, you may shower in 24 hours.  The glue will flake off over the next 2-3 weeks.  Any sutures or staples will be removed at the office during your follow-up visit. 8. ACTIVITIES:  You may resume regular (light) daily activities beginning the next day--such as daily self-care, walking, climbing stairs--gradually increasing activities as tolerated.  You may have sexual intercourse when it is comfortable.  Refrain from any heavy lifting or straining until approved by your doctor. a. You may drive when you are no longer taking prescription pain medication, you can comfortably wear a seatbelt, and you can safely maneuver your car and apply brakes. b. RETURN TO WORK:  __________________________________________________________ 9. You should see your doctor in the office for a follow-up appointment approximately 2-3 weeks after your surgery.  Make sure that you call for this appointment within a day or two after you arrive home to insure a convenient appointment time. 10. OTHER INSTRUCTIONS: NO LIFTING MORE THAN 20 POUNDS FOR 4 WEEKS 11. ICE PACK AND IBUPROFEN AS NEEDED FOR PAIN __________________________________________________________________________________________________________________________________________________________________________________________  WHEN TO CALL YOUR DOCTOR: 1. Fever over 101.0 2. Inability to urinate 3. Nausea and/or vomiting 4. Extreme swelling or bruising 5. Continued bleeding from incision. 6. Increased pain, redness, or drainage from the incision  The clinic staff is available to answer your questions during regular business hours.  Please don't hesitate to call and ask to speak to one of the nurses for clinical concerns.  If you have a medical emergency, go to the nearest emergency room or call  911.  A surgeon from [facility] is always on call at the hospital   [address], [address], [place], [place]  [address] ?  [address] [address],  [place], [place]   [address] [phone] ? [phone] ? FAX [phone] Web site: [email or web address]  Post Anesthesia Home Care Instructions  Activity: Get plenty of rest for the remainder of the day. A responsible adult should stay with you for 24 hours following the procedure.  For the next 24 hours, DO NOT: -Drive a car -Advertising copywriter -Drink alcoholic beverages -Take any medication unless instructed by your physician -Make any legal decisions or sign important papers.  Meals: Start with liquid foods such as gelatin or soup. Progress to regular foods as tolerated. Avoid greasy, spicy, heavy foods. If nausea and/or vomiting occur, drink only clear liquids until the nausea and/or vomiting subsides. Call your physician if vomiting continues.  Special Instructions/Symptoms: Your throat may feel dry or sore from the anesthesia or the breathing tube placed in your throat during surgery. If this causes discomfort, gargle with warm salt water. The discomfort should disappear within 24 hours.

## 2012-05-11 ENCOUNTER — Encounter (HOSPITAL_BASED_OUTPATIENT_CLINIC_OR_DEPARTMENT_OTHER): Payer: Self-pay | Admitting: Surgery

## 2012-05-25 ENCOUNTER — Ambulatory Visit (INDEPENDENT_AMBULATORY_CARE_PROVIDER_SITE_OTHER): Payer: PRIVATE HEALTH INSURANCE | Admitting: Surgery

## 2012-05-25 ENCOUNTER — Encounter (INDEPENDENT_AMBULATORY_CARE_PROVIDER_SITE_OTHER): Payer: Self-pay | Admitting: Surgery

## 2012-05-25 VITALS — BP 120/75 | HR 78 | Temp 97.2°F | Resp 18 | Ht 71.0 in | Wt 218.0 lb

## 2012-05-25 DIAGNOSIS — Z09 Encounter for follow-up examination after completed treatment for conditions other than malignant neoplasm: Secondary | ICD-10-CM

## 2012-05-25 NOTE — Progress Notes (Signed)
Subjective:     Patient ID: Jordan Buchanan, male   DOB: [birth year], 41 y.o.   MRN: [id number]  HPI He is here for his first postop visit status post umbilical hernia repair with mesh. He is doing very well and has no complaints.  Review of Systems     Objective:   Physical Exam On exam, his incision is well healed and there is no evidence of recurrence    Assessment:     Patient status post umbilical hernia repair with mesh    Plan:     He will refrain from heavy lifting for 2 more weeks. He will go and return to work on Monday. I will see him back as needed

## 2012-06-02 ENCOUNTER — Encounter (HOSPITAL_COMMUNITY): Payer: Self-pay | Admitting: Nurse Practitioner

## 2012-06-02 ENCOUNTER — Emergency Department (HOSPITAL_COMMUNITY)
Admission: EM | Admit: 2012-06-02 | Discharge: 2012-06-03 | Disposition: A | Discharge status: Home / Self Care | Payer: PRIVATE HEALTH INSURANCE | Attending: Emergency Medicine | Admitting: Emergency Medicine

## 2012-06-02 DIAGNOSIS — R112 Nausea with vomiting, unspecified: Secondary | ICD-10-CM | POA: Insufficient documentation

## 2012-06-02 DIAGNOSIS — IMO0002 Reserved for concepts with insufficient information to code with codable children: Secondary | ICD-10-CM | POA: Insufficient documentation

## 2012-06-02 DIAGNOSIS — R10819 Abdominal tenderness, unspecified site: Secondary | ICD-10-CM | POA: Insufficient documentation

## 2012-06-02 DIAGNOSIS — K7689 Other specified diseases of liver: Secondary | ICD-10-CM | POA: Insufficient documentation

## 2012-06-02 DIAGNOSIS — R197 Diarrhea, unspecified: Secondary | ICD-10-CM | POA: Insufficient documentation

## 2012-06-02 DIAGNOSIS — Y838 Other surgical procedures as the cause of abnormal reaction of the patient, or of later complication, without mention of misadventure at the time of the procedure: Secondary | ICD-10-CM | POA: Insufficient documentation

## 2012-06-02 DIAGNOSIS — R000 Tachycardia, unspecified: Secondary | ICD-10-CM | POA: Insufficient documentation

## 2012-06-02 DIAGNOSIS — I10 Essential (primary) hypertension: Secondary | ICD-10-CM | POA: Insufficient documentation

## 2012-06-02 DIAGNOSIS — N200 Calculus of kidney: Secondary | ICD-10-CM | POA: Insufficient documentation

## 2012-06-02 DIAGNOSIS — R109 Unspecified abdominal pain: Secondary | ICD-10-CM | POA: Insufficient documentation

## 2012-06-02 LAB — COMPREHENSIVE METABOLIC PANEL
ALT: 32 U/L (ref 0–53)
Alkaline Phosphatase: 58 U/L (ref 39–117)
CO2: 24 mEq/L (ref 19–32)
Chloride: 99 mEq/L (ref 96–112)
GFR calc Af Amer: >90 mL/min (ref >90)
GFR calc non Af Amer: >90 mL/min (ref >90)
Glucose, Bld: 112 mg/dL — ABNORMAL HIGH (ref 70–99)
Potassium: 3.5 mEq/L (ref 3.5–5.1)
Sodium: 137 mEq/L (ref 135–145)
Total Bilirubin: 1.7 mg/dL — ABNORMAL HIGH (ref 0.3–1.2)
Total Protein: 7.2 g/dL (ref 6.0–8.3)

## 2012-06-02 LAB — CBC
HCT: 43.2 % (ref 39.0–52.0)
Hemoglobin: 16.4 g/dL (ref 13.0–17.0)
MCHC: 36.6 g/dL — ABNORMAL HIGH (ref 30.0–36.0)
Platelets: 217 10*3/uL (ref 150–400)
RBC: 5.3 MIL/uL (ref 4.22–5.81)
RDW: 12.3 % (ref 11.5–15.5)
WBC: 10.6 10*3/uL — ABNORMAL HIGH (ref 4.0–10.5)

## 2012-06-02 LAB — URINALYSIS, ROUTINE W REFLEX MICROSCOPIC
Glucose, UA: NEGATIVE mg/dL
Leukocytes, UA: NEGATIVE
Nitrite: NEGATIVE
Specific Gravity, Urine: 1.03 (ref 1.005–1.030)
pH: 5.5 (ref 5.0–8.0)

## 2012-06-02 LAB — DIFFERENTIAL
Basophils Absolute: 0 10*3/uL (ref 0.0–0.1)
Basophils Relative: 0 % (ref 0–1)
Monocytes Absolute: 0.3 10*3/uL (ref 0.1–1.0)
Neutro Abs: 9 10*3/uL — ABNORMAL HIGH (ref 1.7–7.7)

## 2012-06-02 MED ORDER — ONDANSETRON HCL 4 MG/2ML IJ SOLN
4.0000 mg | Freq: Once | INTRAMUSCULAR | Status: AC
  Administered 2012-06-02: 4 mg via INTRAVENOUS
  Filled 2012-06-02: qty 2

## 2012-06-02 MED ORDER — ONDANSETRON 4 MG PO TBDP
ORAL_TABLET | ORAL | Status: AC
  Filled 2012-06-02: qty 2

## 2012-06-02 MED ORDER — SODIUM CHLORIDE 0.9 % IV BOLUS (SEPSIS)
1000.0000 mL | Freq: Once | INTRAVENOUS | Status: AC
  Administered 2012-06-02: 1000 mL via INTRAVENOUS

## 2012-06-02 MED ORDER — IOHEXOL 300 MG/ML  SOLN
20.0000 mL | INTRAMUSCULAR | Status: AC
  Administered 2012-06-02 – 2012-06-03 (×2): 20 mL via ORAL

## 2012-06-02 MED ORDER — ONDANSETRON 4 MG PO TBDP
8.0000 mg | ORAL_TABLET | Freq: Once | ORAL | Status: AC
  Administered 2012-06-02: 8 mg via ORAL

## 2012-06-02 MED ORDER — HYDROMORPHONE HCL PF 1 MG/ML IJ SOLN
1.0000 mg | Freq: Once | INTRAMUSCULAR | Status: AC
  Administered 2012-06-02: 1 mg via INTRAVENOUS
  Filled 2012-06-02: qty 1

## 2012-06-02 NOTE — ED Notes (Signed)
Pt c/o abd/lower back pain, n/v/d, throughout the day today. States vomit is pink colored. Reports a hernia surgery 3 weeks ago.

## 2012-06-02 NOTE — ED Provider Notes (Signed)
11:44 PM Patient is in CDU holding for CT abd/pelvis.  Sign out received from Dr Jeraldine Loots.  Pt is several weeks s/p hernia repair.  Now with abdominal pain, N/V.  Pt to have CT of abd/pelvis.  Surgery has previously been made aware of patient.  Pt is currently drinking 2nd cup of contract, scheduled to be finished at 12:40am.  Pt reports pain and nausea currently controlled, no needs at this time.  Abdomen is soft, nondistended, TTP right middle abdomen, no guarding, no rebound.    Patient discussed with Dr Judd Lien who assumes care of change of shift.   Dillard Cannon [place], [place] 06/02/12 2358

## 2012-06-02 NOTE — ED Provider Notes (Signed)
History     CSN: [id number]  Arrival date & time 06/02/12  1825   First MD Initiated Contact with Patient 06/02/12 2138      Chief Complaint  Patient presents with  . Abdominal Pain    (Consider location/radiation/quality/duration/timing/severity/associated sxs/prior treatment) HPI The patient presents with concerns of abdominal pain, nausea, vomiting.  Notably, the patient had umbilical hernia surgery 3 weeks ago.  He notes that since that procedure she was essentially well.  Today, over the course the day he gradually developed his symptoms.  He complains most prominently of periumbilical abdominal pain, described as sharp, burning, sore.  He also complains of nausea with multiple episodes of emesis.  He has had several episodes of atypical stool over the past days.  Last bowel movement was May the onset of symptoms earlier today.  He denies fevers or chills or confusion.  He notes minimal relief with OTC medication. Past Medical History  Diagnosis Date  . Umbilical hernia   . Hypertension   . Generalized headaches   . Migraines   . Allergy   . LVH (left ventricular hypertrophy)   . Spine disorder   . Drug abuse     History of previous drug abuse, without recurrence  . Ankylosing spondylitis   . Arrhythmia     PALPITATIONS AND PVC  . GERD (gastroesophageal reflux disease)   . Arthritis     TAKES HUMIRA    Past Surgical History  Procedure Date  . Rotator cuff repair     left  . Other surgical history 1992    facial surgery  . Umbilical hernia repair 05/10/2012    Procedure: HERNIA REPAIR UMBILICAL ADULT;  Surgeon: Shelly Rubenstein, MD;  Location: [facility];  Service: General;  Laterality: N/A;  . Hernia repair 05/10/2012    umb hernia    Family History  Problem Relation Age of Onset  . Asthma Father   . Hypertension Brother     History  Substance Use Topics  . Smoking status: Former Smoker    Quit date: 08/09/2001  . Smokeless tobacco: Not on  file  . Alcohol Use: No      Review of Systems  Constitutional:       Per HPI, otherwise negative  HENT:       Per HPI, otherwise negative  Eyes: Negative.   Respiratory:       Per HPI, otherwise negative  Cardiovascular:       Per HPI, otherwise negative  Gastrointestinal: Positive for nausea, vomiting, abdominal pain and diarrhea.  Genitourinary: Negative.   Musculoskeletal:       Per HPI, otherwise negative  Skin: Negative.   Neurological: Negative for syncope.    Allergies  Review of patient's allergies indicates no known allergies.  Home Medications   Current Outpatient Rx  Name Route Sig Dispense Refill  . ADALIMUMAB 40 MG/0.8ML [place] KIT Subcutaneous Inject 40 mg into the skin every 14 (fourteen) days.     Marland Kitchen HYDROCHLOROTHIAZIDE 25 MG PO TABS Oral Take 1 tablet (25 mg total) by mouth daily. 30 tablet 6  . LORATADINE 10 MG PO TABS Oral Take 10 mg by mouth daily as needed. For allergies    . METOPROLOL SUCCINATE ER 25 MG PO TB24 Oral Take 25 mg by mouth daily.      . ADULT MULTIVITAMIN W/MINERALS CH Oral Take 1 tablet by mouth daily.    Marland Kitchen FISH OIL 1200 MG PO CAPS Oral Take 1 capsule  by mouth daily.      Marland Kitchen OMEPRAZOLE MAGNESIUM 20 MG PO TBEC Oral Take 20 mg by mouth daily.      BP 129/86  Pulse 115  Temp 98.2 F (36.8 C) (Oral)  Resp 20  Ht 5\' 10"  (1.778 m)  Wt 205 lb (92.987 kg)  BMI 29.41 kg/m2  SpO2 98%  Physical Exam  Nursing note and vitals reviewed. Constitutional: He is oriented to person, place, and time. He appears well-developed. No distress.  HENT:  Head: Normocephalic and atraumatic.  Eyes: Conjunctivae and EOM are normal.  Cardiovascular: Regular rhythm.  Tachycardia present.   Pulmonary/Chest: Effort normal. No stridor. No respiratory distress.  Abdominal: He exhibits no distension, no fluid wave and no ascites. There is no hepatosplenomegaly. There is tenderness in the epigastric area and periumbilical area. There is no rigidity, no rebound, no  guarding and no CVA tenderness.  Musculoskeletal: He exhibits no edema.  Neurological: He is alert and oriented to person, place, and time.  Skin: Skin is warm and dry.  Psychiatric: He has a normal mood and affect.    ED Course  Procedures (including critical care time)  Labs Reviewed  CBC - Abnormal; Notable for the following:    WBC 10.6 (*)     MCHC 36.9 (*)     All other components within normal limits  URINALYSIS, ROUTINE W REFLEX MICROSCOPIC  COMPREHENSIVE METABOLIC PANEL  CBC  DIFFERENTIAL  LIPASE, BLOOD   No results found.   No diagnosis found.    MDM  This generally well-appearing young male presents several weeks after repair of an umbilical hernia now with nausea, abdominal pain, vomiting.  On exam the patient is mildly tachycardic, but in no distress.  There is tenderness about the mid abdomen.  Abdomen is non-peritoneal.  Given the recent surgical history, the patient had a CT scan performed to evaluate for obstruction versus abscess.  The absence of fever in the minimal leukocytosis is reassuring.  The patient's care was endorsed to physician assistant Shelva Majestic and Dr. Judd Lien for the completion of his care in the CDU  Gerhard Munch, MD 06/02/12 2354

## 2012-06-02 NOTE — ED Notes (Signed)
Pt states he has not vomited since the zofran, however he now has a headache and R lower back pain. Pt tachycardic.

## 2012-06-03 ENCOUNTER — Emergency Department (HOSPITAL_COMMUNITY): Payer: PRIVATE HEALTH INSURANCE

## 2012-06-03 ENCOUNTER — Encounter (HOSPITAL_COMMUNITY): Payer: Self-pay | Admitting: Radiology

## 2012-06-03 MED ORDER — HYDROCODONE-ACETAMINOPHEN 5-325 MG PO TABS
2.0000 | ORAL_TABLET | Freq: Once | ORAL | Status: AC
  Administered 2012-06-03: 2 via ORAL
  Filled 2012-06-03: qty 2

## 2012-06-03 MED ORDER — PROMETHAZINE HCL 25 MG PO TABS: 25.0000 mg | ORAL_TABLET | Freq: Four times a day (QID) | ORAL | Status: DC | PRN

## 2012-06-03 MED ORDER — IOHEXOL 300 MG/ML  SOLN
100.0000 mL | Freq: Once | INTRAMUSCULAR | Status: AC | PRN
  Administered 2012-06-03: 100 mL via INTRAVENOUS

## 2012-06-03 MED ORDER — HYDROCODONE-ACETAMINOPHEN 5-500 MG PO TABS: 1.0000 | ORAL_TABLET | Freq: Four times a day (QID) | ORAL | Status: AC | PRN

## 2012-06-03 NOTE — ED Notes (Signed)
CT notified that pt has finished drinking contrast 

## 2012-06-03 NOTE — ED Notes (Signed)
Pt drinking contrast for CT.  Pt st's pain is better at this time.  Family at bedside.

## 2012-06-03 NOTE — ED Provider Notes (Signed)
Care assumed from Dr. Jeraldine Loots at shift change.  I agree with his note, assessment, and plan.  The patient presented with vomiting, abd pain status post ventral hernia repair.  He was awaiting a ct scan to rule out obstruction.  The results returned and showed a small seroma but no abscess or obstruction.  He is afebrile, has no wbc elevation and is resting comfortably in the room.  He was re-examined and there was no redness of the abdomen at the incision site, and the abdominal exam was benign.  He will be discharged to home with pain meds, nausea meds and follow up with surgery in the next week.   Of note is that the wife also reported that Jordan Buchanan has also been having back pain, headaches, blue-green colored stool, and vomiting red material she believes may be blood.  She states that when she came home from work, he was on the floor "crying with a headache".  I am unsure as to where these additional symptoms fit in with the clinical picture and I offered to perform a ct of the head which the patient declined.    Jordan Lyons, MD 06/03/12 [phone]

## 2012-06-03 NOTE — ED Notes (Signed)
Assumed care of pt. Report received from brenda RN

## 2012-06-03 NOTE — ED Notes (Signed)
Cell number for wife: [phone]

## 2012-06-03 NOTE — ED Notes (Signed)
Pt back from CT.  Denies abd pain or nausea, but continues to c/o pain to lower back.  Pt stated decreased pain after pillow placed behind lower back.

## 2012-06-03 NOTE — ED Provider Notes (Signed)
Medical screening examination/treatment/procedure(s) were conducted as a shared visit with non-physician practitioner(s) and myself.  I personally evaluated the patient during the encounter Please see my initial note.  Gerhard Munch, MD 06/03/12 [phone]

## 2012-06-03 NOTE — ED Notes (Signed)
CT called for results.

## 2012-06-03 NOTE — ED Notes (Signed)
Pt returns from CT.

## 2012-08-06 ENCOUNTER — Emergency Department (HOSPITAL_BASED_OUTPATIENT_CLINIC_OR_DEPARTMENT_OTHER): Payer: PRIVATE HEALTH INSURANCE

## 2012-08-06 ENCOUNTER — Emergency Department (HOSPITAL_BASED_OUTPATIENT_CLINIC_OR_DEPARTMENT_OTHER)
Admission: EM | Admit: 2012-08-06 | Discharge: 2012-08-06 | Disposition: A | Discharge status: Home / Self Care | Payer: PRIVATE HEALTH INSURANCE | Attending: Emergency Medicine | Admitting: Emergency Medicine

## 2012-08-06 ENCOUNTER — Encounter (HOSPITAL_BASED_OUTPATIENT_CLINIC_OR_DEPARTMENT_OTHER): Payer: Self-pay | Admitting: *Deleted

## 2012-08-06 DIAGNOSIS — Z87891 Personal history of nicotine dependence: Secondary | ICD-10-CM | POA: Insufficient documentation

## 2012-08-06 DIAGNOSIS — M7020 Olecranon bursitis, unspecified elbow: Secondary | ICD-10-CM

## 2012-08-06 DIAGNOSIS — Z825 Family history of asthma and other chronic lower respiratory diseases: Secondary | ICD-10-CM | POA: Insufficient documentation

## 2012-08-06 DIAGNOSIS — Z8249 Family history of ischemic heart disease and other diseases of the circulatory system: Secondary | ICD-10-CM | POA: Insufficient documentation

## 2012-08-06 DIAGNOSIS — G43909 Migraine, unspecified, not intractable, without status migrainosus: Secondary | ICD-10-CM | POA: Insufficient documentation

## 2012-08-06 DIAGNOSIS — I498 Other specified cardiac arrhythmias: Secondary | ICD-10-CM | POA: Insufficient documentation

## 2012-08-06 DIAGNOSIS — I517 Cardiomegaly: Secondary | ICD-10-CM | POA: Insufficient documentation

## 2012-08-06 DIAGNOSIS — K219 Gastro-esophageal reflux disease without esophagitis: Secondary | ICD-10-CM | POA: Insufficient documentation

## 2012-08-06 DIAGNOSIS — I10 Essential (primary) hypertension: Secondary | ICD-10-CM | POA: Insufficient documentation

## 2012-08-06 MED ORDER — HYDROCODONE-ACETAMINOPHEN 5-325 MG PO TABS: 1.0000 | ORAL_TABLET | Freq: Four times a day (QID) | ORAL | Status: AC | PRN

## 2012-08-06 MED ORDER — IBUPROFEN 800 MG PO TABS
800.0000 mg | ORAL_TABLET | Freq: Once | ORAL | Status: AC
  Administered 2012-08-06: 800 mg via ORAL
  Filled 2012-08-06: qty 1

## 2012-08-06 MED ORDER — IBUPROFEN 800 MG PO TABS: 800.0000 mg | ORAL_TABLET | Freq: Three times a day (TID) | ORAL | Status: AC | PRN

## 2012-08-06 NOTE — ED Provider Notes (Signed)
History  This chart was scribed for Charles B. Bernette Mayers, MD by Shari Heritage. The patient was seen in room MH03/MH03. Patient's care was started at 1627.     CSN: [id number]  Arrival date & time 08/06/12  1627   First MD Initiated Contact with Patient 08/06/12 1635      Chief Complaint  Patient presents with  . Elbow Injury    The history is provided by the patient. No language interpreter was used.   Jordan Buchanan is a 41 y.o. male who presents to the Emergency Department complaining of moderate, constant left elbow pain with associated swelling onset a couple of hours ago. Patient says that he hit his elbow several times while working on his car this afternoon. No fever. Patient reports no other symptoms. He did not report any relieving factors. Patient has a medical history of HTN, left ventricle hypertrophy, ankylosing spondylitis, GERD and arrythmia. He has a surgical history of left rotator cuff repair and umbilical hernia repair. Patient is a former smoker (quit date: 08/09/2001).    Past Medical History  Diagnosis Date  . Umbilical hernia   . Hypertension   . Generalized headaches   . Migraines   . Allergy   . LVH (left ventricular hypertrophy)   . Spine disorder   . Drug abuse     History of previous drug abuse, without recurrence  . Ankylosing spondylitis   . Arrhythmia     PALPITATIONS AND PVC  . GERD (gastroesophageal reflux disease)   . Arthritis     TAKES HUMIRA    Past Surgical History  Procedure Date  . Rotator cuff repair     left  . Other surgical history 1992    facial surgery  . Umbilical hernia repair 05/10/2012    Procedure: HERNIA REPAIR UMBILICAL ADULT;  Surgeon: Shelly Rubenstein, MD;  Location: [facility];  Service: General;  Laterality: N/A;  . Hernia repair 05/10/2012    umb hernia    Family History  Problem Relation Age of Onset  . Asthma Father   . Hypertension Brother     History  Substance Use Topics  . Smoking  status: Former Smoker    Quit date: 08/09/2001  . Smokeless tobacco: Not on file  . Alcohol Use: No      Review of Systems A complete 10 system review of systems was obtained and all systems are negative except as noted in the HPI and PMH.    Allergies  Review of patient's allergies indicates no known allergies.  Home Medications   Current Outpatient Rx  Name Route Sig Dispense Refill  . ADALIMUMAB 40 MG/0.8ML [place] KIT Subcutaneous Inject 40 mg into the skin every 14 (fourteen) days.     Marland Kitchen HYDROCHLOROTHIAZIDE 25 MG PO TABS Oral Take 1 tablet (25 mg total) by mouth daily. 30 tablet 6  . LORATADINE 10 MG PO TABS Oral Take 10 mg by mouth daily as needed. For allergies    . METOPROLOL SUCCINATE ER 25 MG PO TB24 Oral Take 25 mg by mouth daily.      . ADULT MULTIVITAMIN W/MINERALS CH Oral Take 1 tablet by mouth daily.    Marland Kitchen FISH OIL 1200 MG PO CAPS Oral Take 1 capsule by mouth daily.      Marland Kitchen OMEPRAZOLE MAGNESIUM 20 MG PO TBEC Oral Take 20 mg by mouth daily.    Marland Kitchen PROMETHAZINE HCL 25 MG PO TABS Oral Take 1 tablet (25 mg total) by mouth  every 6 (six) hours as needed for nausea. 10 tablet 1    There were no vitals taken for this visit.  Physical Exam  Nursing note and vitals reviewed. Constitutional: He is oriented to person, place, and time. He appears well-developed and well-nourished.  HENT:  Head: Normocephalic and atraumatic.  Eyes: EOM are normal. Pupils are equal, round, and reactive to light.  Neck: Normal range of motion. Neck supple.  Cardiovascular: Normal rate, normal heart sounds and intact distal pulses.   Pulmonary/Chest: Effort normal and breath sounds normal.  Abdominal: Bowel sounds are normal. He exhibits no distension. There is no tenderness.  Musculoskeletal: Normal range of motion. He exhibits no edema and no tenderness.       Left elbow: He exhibits swelling.       Arms: Neurological: He is alert and oriented to person, place, and time. He has normal strength. No  cranial nerve deficit or sensory deficit.  Skin: Skin is warm and dry. No rash noted.  Psychiatric: He has a normal mood and affect.    ED Course  Procedures (including critical care time) DIAGNOSTIC STUDIES: Oxygen Saturation is 98% on room air, normal by my interpretation.    COORDINATION OF CARE: 4:39pm- Patient informed of current plan for treatment and evaluation and agrees with plan at this time. Ordered x-ray of left elbow. Will discharge patient home with prescriptions for Ibuprofen 800 mg and Norco 5-325 mg if x-ray is unremarkable.  Dg Elbow Complete Left  08/06/2012  *RADIOLOGY REPORT*  Clinical Data: Blow to the elbow.  Injury and pain.  LEFT ELBOW - COMPLETE 3+ VIEW  Comparison: None.  Findings: Imaged bones, joints and soft tissues appear normal.  IMPRESSION: Normal study.  Original Report Authenticated By: Bernadene Bell. D'ALESSIO, M.D.     1. Olecranon bursitis       MDM  Xray neg. Pt has olecrenon bursitis without infection. Advised NSAIDs, rest, compression, etc.       I personally performed the services described in the documentation, which were scribed in my presence. The recorded information has been reviewed and considered.     Charles B. Bernette Mayers, MD 08/07/12 [id number]

## 2012-08-06 NOTE — ED Notes (Signed)
Pt states he hit his left elbow several times while working on a car. Now c/o pain and swelling to same.

## 2012-09-02 ENCOUNTER — Other Ambulatory Visit: Payer: Self-pay | Admitting: Cardiology

## 2012-12-09 ENCOUNTER — Emergency Department (HOSPITAL_BASED_OUTPATIENT_CLINIC_OR_DEPARTMENT_OTHER)
Admission: EM | Admit: 2012-12-09 | Discharge: 2012-12-09 | Discharge status: Against Medical Advice | Payer: PRIVATE HEALTH INSURANCE | Attending: Emergency Medicine | Admitting: Emergency Medicine

## 2012-12-09 ENCOUNTER — Encounter (HOSPITAL_BASED_OUTPATIENT_CLINIC_OR_DEPARTMENT_OTHER): Payer: Self-pay | Admitting: *Deleted

## 2012-12-09 DIAGNOSIS — M79609 Pain in unspecified limb: Secondary | ICD-10-CM | POA: Insufficient documentation

## 2012-12-09 NOTE — ED Notes (Signed)
Left great toe pain since yesterday. Hx same "couple months ago" and placed on antibiotic.

## 2013-04-29 ENCOUNTER — Other Ambulatory Visit: Payer: Self-pay | Admitting: Cardiology

## 2013-05-09 ENCOUNTER — Encounter: Payer: Self-pay | Admitting: Family Medicine

## 2013-05-09 ENCOUNTER — Ambulatory Visit (INDEPENDENT_AMBULATORY_CARE_PROVIDER_SITE_OTHER): Payer: PRIVATE HEALTH INSURANCE | Admitting: Family Medicine

## 2013-05-09 VITALS — BP 124/82 | HR 88 | Temp 98.2°F | Ht 70.0 in | Wt 211.5 lb

## 2013-05-09 DIAGNOSIS — J302 Other seasonal allergic rhinitis: Secondary | ICD-10-CM

## 2013-05-09 DIAGNOSIS — K219 Gastro-esophageal reflux disease without esophagitis: Secondary | ICD-10-CM

## 2013-05-09 DIAGNOSIS — I10 Essential (primary) hypertension: Secondary | ICD-10-CM

## 2013-05-09 DIAGNOSIS — J309 Allergic rhinitis, unspecified: Secondary | ICD-10-CM

## 2013-05-09 DIAGNOSIS — N419 Inflammatory disease of prostate, unspecified: Secondary | ICD-10-CM

## 2013-05-09 DIAGNOSIS — M129 Arthropathy, unspecified: Secondary | ICD-10-CM

## 2013-05-09 DIAGNOSIS — G43909 Migraine, unspecified, not intractable, without status migrainosus: Secondary | ICD-10-CM

## 2013-05-09 DIAGNOSIS — J3089 Other allergic rhinitis: Secondary | ICD-10-CM | POA: Insufficient documentation

## 2013-05-09 DIAGNOSIS — M1990 Unspecified osteoarthritis, unspecified site: Secondary | ICD-10-CM

## 2013-05-09 HISTORY — DX: Inflammatory disease of prostate, unspecified: N41.9

## 2013-05-09 LAB — POCT URINALYSIS DIPSTICK
Bilirubin, UA: NEGATIVE
Blood, UA: NEGATIVE
Ketones, UA: NEGATIVE
Nitrite, UA: NEGATIVE
pH, UA: 7

## 2013-05-09 MED ORDER — SULFAMETHOXAZOLE-TRIMETHOPRIM 800-160 MG PO TABS: 1.0000 | ORAL_TABLET | Freq: Two times a day (BID) | ORAL | Status: DC

## 2013-05-09 NOTE — Assessment & Plan Note (Signed)
Chronic, stable. Continue meds. H/o LVH, however last check resolved per patient.

## 2013-05-09 NOTE — Assessment & Plan Note (Signed)
sxs of incomplete emptying over last month, some scrotal discomfort. Exam with slight enlarged prostate - will treat as low grade prostatitis with 2 wk course of antibiotic (bactrim). Advised to return if sxs persist or worsen. Check UA and UCx today.

## 2013-05-09 NOTE — Addendum Note (Signed)
Addended by: Josph Macho A on: 05/09/2013 12:02 PM   Modules accepted: Orders

## 2013-05-09 NOTE — Assessment & Plan Note (Signed)
Stable on prn antihistamine 

## 2013-05-09 NOTE — Progress Notes (Signed)
Subjective:    Patient ID: Jordan Buchanan, male    DOB: [birth year], 42 y.o.   MRN: [id number]  HPI CC: new pt to establish  No prior PCP.  Noticing red spots on abdomen as well as swelling left neck, very itchy, for the last year.  Trouble voiding completely associated with scrotal pain and mild increased frequency.  Going on for 1 month.  No dysuria, urgency, fevers/chills, nausea, lower back pain.  No BM changes.  No h/o prostate problems.  No hematuria.  Occasional clear discharge from urethra noted.  ?ankylosing spondylitis prior on humira.  Off shots for last year.  Has seen Dr. Corliss Skains  H/o migraines - associated with vision changes vs vertigo.  HTN - on HCTZ and tprol XL, compliant with meds.  On bp meds for 10 + yrs.  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  Has seen Dr. Deborah Chalk in past.  Actually LVH cleared with healthier lifestyle.  Trying to lose weight - lost 25 lbs in last few months. Wt Readings from Last 3 Encounters:  05/09/13 211 lb 8 oz (95.936 kg)  12/09/12 215 lb (97.523 kg)  08/06/12 200 lb (90.719 kg)   Body mass index is 30.35 kg/(m^2).  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  Past histories reviewed and updated if relevant as below. Patient Active Problem List   Diagnosis Date Noted  . Prostatitis 05/09/2013  . Migraines   . Seasonal allergies   . GERD (gastroesophageal reflux disease)   . Arthritis   . Hypertension    Past Medical History  Diagnosis Date  . Hypertension     h/o LVH, resolved  . Migraines   . Seasonal allergies   . Arrhythmia     PALPITATIONS AND PVC - improved with cutting down on caffeine  . GERD (gastroesophageal reflux disease)   . Arthritis     ?AS, prior on Humira   Past Surgical History   Procedure Laterality Date  . Rotator cuff repair  2009    left  . Other surgical history  1992    facial surgery  . Umbilical hernia repair  05/10/2012    Procedure: HERNIA REPAIR UMBILICAL ADULT;  Surgeon: Shelly Rubenstein, MD;  Location: [facility];  Service: General;  Laterality: N/A;   History  Substance Use Topics  . Smoking status: Former Smoker    Quit date: 08/09/2001  . Smokeless tobacco: Current User    Types: Snuff     Comment: at work  . Alcohol Use: No     Comment: quit 2002   Family History  Problem Relation Age of Onset  . Asthma Father   . Hypertension Brother     obese  . CAD Paternal Grandfather     MI x2  . Multiple sclerosis Brother   . Arthritis Father     father, brother, Pgrandfather  . Stroke Neg Hx   . Cancer Neg Hx    No Known Allergies Current Outpatient Prescriptions on File Prior to Visit  Medication Sig Dispense Refill  . hydrochlorothiazide (HYDRODIURIL) 25 MG tablet TAKE 1 TABLET (25 MG TOTAL) BY MOUTH DAILY.  30 tablet  0  . loratadine (CLARITIN) 10 MG tablet Take 10 mg by mouth daily as needed for allergies. For seasonal  allergies      . omeprazole (PRILOSEC OTC) 20 MG tablet Take 20 mg by mouth daily.       No current facility-administered medications on file prior to visit.     Review of Systems  Constitutional: Negative for fever, chills, activity change, appetite change, fatigue and unexpected weight change.  HENT: Negative for hearing loss and neck pain.   Eyes: Negative for visual disturbance.  Respiratory: Negative for cough, chest tightness, shortness of breath and wheezing.   Cardiovascular: Negative for chest pain, palpitations and leg swelling.  Gastrointestinal: Negative for nausea, vomiting, abdominal pain, diarrhea, constipation, blood in stool and abdominal distention.  Genitourinary: Positive for difficulty urinating. Negative for hematuria.  Musculoskeletal: Negative for myalgias and arthralgias.   Skin: Negative for rash.  Neurological: Negative for dizziness, seizures, syncope and headaches.  Hematological: Negative for adenopathy. Does not bruise/bleed easily.  Psychiatric/Behavioral: Negative for dysphoric mood. The patient is not nervous/anxious.        Objective:   Physical Exam  Nursing note and vitals reviewed. Constitutional: He is oriented to person, place, and time. He appears well-developed and well-nourished. No distress.  HENT:  Head: Normocephalic and atraumatic.  Nose: Nose normal.  Mouth/Throat: Oropharynx is clear and moist.  Eyes: Conjunctivae and EOM are normal. Pupils are equal, round, and reactive to light.  Neck: Normal range of motion. Neck supple. No thyromegaly present.  Cardiovascular: Normal rate, regular rhythm, normal heart sounds and intact distal pulses.   No murmur heard. Pulses:      Radial pulses are 2+ on the right side, and 2+ on the left side.  Pulmonary/Chest: Effort normal and breath sounds normal. No respiratory distress. He has no wheezes. He has no rales.  Abdominal: Soft. Bowel sounds are normal. He exhibits no distension and no mass. There is no tenderness. There is no rebound and no guarding. Hernia confirmed negative in the right inguinal area and confirmed negative in the left inguinal area.  Genitourinary: Rectum normal and testes normal. Rectal exam shows no external hemorrhoid, no internal hemorrhoid, no fissure, no mass, no tenderness and anal tone normal. Prostate is enlarged (~20gm). Prostate is not tender. Right testis shows no mass, no swelling and no tenderness. Right testis is descended. Left testis shows no mass, no swelling and no tenderness. Left testis is descended. Circumcised. No phimosis, penile erythema or penile tenderness. No discharge found.  Musculoskeletal: Normal range of motion.  Lymphadenopathy:    He has no cervical adenopathy.       Right: No inguinal adenopathy present.       Left: No inguinal adenopathy  present.  Neurological: He is alert and oriented to person, place, and time.  CN grossly intact, station and gait intact  Skin: Skin is warm and dry. No rash noted.  Benign small cherry angiomas on abdomen  Psychiatric: He has a normal mood and affect. His behavior is normal. Judgment and thought content normal.       Assessment & Plan:

## 2013-05-09 NOTE — Patient Instructions (Addendum)
Cherry angiomas are benign skin growths. Will treat possible prostate infection with course of bactrim for 2 weeks.  Take with food. Let me know if not improving as expected. Good to meet you today, call us with questions. Make sure to use sunscreen consistently when outside.  Prostatitis The prostate gland is about the size and shape of a walnut. It is located just below your bladder. It produces one of the components of semen, which is made up of sperm and the fluids that help nourish and transport it out from the testicles. Prostatitis is redness, soreness, and swelling (inflammation) of the prostate gland.  There are 3 types of prostatitis:  Acute bacterial prostatitis This is the least common type of prostatitis. It starts quickly and usually leads to a bladder infection. It can occur at any age.  Chronic bacterial prostatitis This is a persistent bacterial infection in the prostate. It usually develops from repeated acute bacterial prostatitis or acute bacterial prostatitis that was not properly treated. It can occur in men of any age but is most common in middle-aged men whose prostate has begun to enlarge.   Chronic prostatitis chronic pelvic pain syndrome This is the most common type of prostatitis. It is inflammation of the prostate gland that is not caused by a bacterial infection. The cause is unknown. CAUSES The cause of acute and chronic bacterial prostatitis is a bacterial infection. The exact cause of chronic prostatitis and chronic pelvic pain syndrome and asymptomatic inflammatory prostatitis is unknown.  SYMPTOMS  Symptoms can vary depending upon the type of prostatitis that exists. There can also be overlap in symptoms. Possible symptoms for each type of prostatitis are listed below. Acute bacterial prostatitis  Painful urination.  Fever or chills.  Muscle or joint pains.  Low back pain.  Low abdominal pain.  Inability to empty bladder completely.  Sudden urge to  urinate.  Frequent urination.  Difficulty starting urine stream.  Weak urine stream.  Discharge from the urethra.  Dribbling after urination.  Rectal pain.  Pain in the testicles, penis, or tip of the penis.  Pain in the space between the anus and scrotum (perineum).  Problems with sexual function.  Painful ejaculation.  Bloody semen. Chronic bacterial prostatitis  The symptoms are similar to those of acute bacterial prostatitis, but they usually are much less severe. Fever, chills, and muscle and joint pain are not associated with chronic bacterial prostatitis. Chronic prostatitis chronic pelvic pain syndrome  Symptoms typically include a dull ache in the scrotum and the perineum. DIAGNOSIS  In order to diagnose prostatitis, your caregiver will ask about your symptoms. If acute or chronic bacterial prostatitis is suspected, a urine sample will be taken and tested (urinalysis). This is to see if there is bacteria in your urine. If the urinalysis result is negative for bacteria, your caregiver may use a finger to feel your prostate (digital rectal exam). This exam helps your caregiver determine if your prostate is swollen and tender. TREATMENT  Treatment for prostatitis depends on the cause. If a bacterial infection is the cause, it can be treated with antibiotic medicine. In cases of chronic bacterial prostatitis, the use of antibiotics for up to 1 month may be necessary. Your caregiver may instruct you to take sitz baths to help relieve pain. A sitz bath is a bath of hot water in which your hips and buttocks are under water. HOME CARE INSTRUCTIONS   Take all medicines as directed by your caregiver.  Take sitz baths as  directed by your caregiver. SEEK MEDICAL CARE IF:   Your symptoms get worse, not better.  You have a fever. SEEK IMMEDIATE MEDICAL CARE IF:   You have chills.  You feel nauseous or vomit.  You feel lightheaded or faint.  You are unable to  urinate.  You have blood or blood clots in your urine. Document Released: 12/03/2000 Document Revised: 02/28/2012 Document Reviewed: 11/08/2011 [facility] Patient Information 2014 [place], [place].

## 2013-05-09 NOTE — Assessment & Plan Note (Signed)
Stable on prilosec otc.

## 2013-05-09 NOTE — Assessment & Plan Note (Signed)
Stable, no recent HA.

## 2013-05-11 LAB — URINE CULTURE: Organism ID, Bacteria: NO GROWTH

## 2013-05-22 ENCOUNTER — Telehealth: Payer: Self-pay

## 2013-05-22 NOTE — Telephone Encounter (Signed)
Pt left v/m pt continuing to have issues almost finished antibiotic; what to do. Left v/m for pt to call back; 05/09/13 visit if symptoms persist or worsen need reck appt.

## 2013-05-28 NOTE — Telephone Encounter (Signed)
Left v/m for pt on all contact # to call our office.

## 2013-06-01 MED ORDER — SULFAMETHOXAZOLE-TRIMETHOPRIM 800-160 MG PO TABS: 1.0000 | ORAL_TABLET | Freq: Two times a day (BID) | ORAL | Status: DC

## 2013-06-01 NOTE — Telephone Encounter (Signed)
Given sxs not improved after 2 wk course of bactrim, I'd like to treat with prolonged course of abx - have sent in 4 wks of bactrim to take.  To come in if persistent sxs after treatment.  May cancel 6/17 appt if pt desires.

## 2013-06-01 NOTE — Addendum Note (Signed)
Addended by: Eustaquio Boyden on: 06/01/2013 01:11 PM   Modules accepted: Orders

## 2013-06-01 NOTE — Telephone Encounter (Signed)
Spoke with pt he has been extremely busy; not having pain now but voiding small amounts and pt does not feel like emptying bladder.no burning or pain or frequency of urine and no fever. Pt said first time he could come in for appt would be Tues 06/05/13; scheduled appt 06/05/13 at 2:45 pm with Dr Reece Agar. Advised pt if condition changes or worsens to call back or go to UC> pt voiced understanding.

## 2013-06-01 NOTE — Telephone Encounter (Signed)
Message left advising patient. Appt cancelled. Instructed pt to call Monday if he still desires appt and I would put him back on the schedule.

## 2013-06-05 ENCOUNTER — Ambulatory Visit: Payer: PRIVATE HEALTH INSURANCE | Admitting: Family Medicine

## 2013-06-05 ENCOUNTER — Other Ambulatory Visit: Payer: Self-pay | Admitting: Cardiology

## 2013-06-08 ENCOUNTER — Encounter: Payer: Self-pay | Admitting: Cardiology

## 2013-06-12 ENCOUNTER — Other Ambulatory Visit: Payer: Self-pay | Admitting: Family Medicine

## 2013-06-19 ENCOUNTER — Encounter: Payer: Self-pay | Admitting: Family Medicine

## 2013-06-28 ENCOUNTER — Ambulatory Visit (INDEPENDENT_AMBULATORY_CARE_PROVIDER_SITE_OTHER): Payer: BC Managed Care – PPO | Admitting: Family Medicine

## 2013-06-28 ENCOUNTER — Encounter: Payer: Self-pay | Admitting: Family Medicine

## 2013-06-28 VITALS — BP 120/90 | HR 92 | Temp 98.0°F | Wt 219.0 lb

## 2013-06-28 DIAGNOSIS — J020 Streptococcal pharyngitis: Secondary | ICD-10-CM

## 2013-06-28 DIAGNOSIS — J209 Acute bronchitis, unspecified: Secondary | ICD-10-CM | POA: Insufficient documentation

## 2013-06-28 DIAGNOSIS — R059 Cough, unspecified: Secondary | ICD-10-CM

## 2013-06-28 DIAGNOSIS — R05 Cough: Secondary | ICD-10-CM

## 2013-06-28 LAB — POCT INFLUENZA A/B
Influenza A, POC: NEGATIVE
Influenza B, POC: NEGATIVE

## 2013-06-28 MED ORDER — AZITHROMYCIN 250 MG PO TABS: ORAL_TABLET | ORAL | Status: DC

## 2013-06-28 MED ORDER — HYDROCODONE-HOMATROPINE 5-1.5 MG/5ML PO SYRP: 5.0000 mL | ORAL_SOLUTION | Freq: Three times a day (TID) | ORAL | Status: DC | PRN

## 2013-06-28 NOTE — Patient Instructions (Addendum)
Stop amoxil, start zithromax.  Use the cough medicine as needed.  Get some rest, drink plenty of fluids. This should gradually resolve.  Take care.

## 2013-06-28 NOTE — Progress Notes (Signed)
Patient was seen earlier in the week at a UC near his work in [place] and Strep test was negative.  Pt was given Amoxicillin ? Strength twice daily.  He is still taking the medication but is getting worse with chest congestion, cough, feeling really bad.  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  Above hx is noted. Still on amoxil. In the meantime with more cough, chest congestion. Chest is sore HA.  Hot/cold, sweats but no documented fever.  Felt like he had a fever.  Green/yellow sputum, worse in the last 24 hours.  Former smoker, quit 12 years ago.  No new rash (he has a rash on the L thigh that occ itches- he had tolerated amoxil w/o rash prev- he has seen derm about this for patch testing prev).  Had some diarrhea after starting the abx.    Works for town [employer] [place].  Out of work today this week.    Meds, vitals, and allergies reviewed.   ROS: See HPI.  Otherwise, noncontributory.  GEN: nad, alert and oriented HEENT: mucous membranes moist, tm w/o erythema, nasal exam w/o erythema, clear discharge noted,  OP with cobblestoning, sinuses not ttp x4 NECK: supple w/o LA CV: rrr.   PULM: ctab, no inc wob EXT: no edema SKIN: no acute rash  Flu neg

## 2013-06-28 NOTE — Assessment & Plan Note (Signed)
Nontoxic, but should be out of work.  Okay for outpatient f/u.  Change to zmax, use hycodan prn with sedation caution and fu prn.  Would start zmax given the continued sx with the purulent sputum. No focal dec in BS on exam.

## 2013-07-02 ENCOUNTER — Telehealth: Payer: Self-pay

## 2013-07-02 NOTE — Telephone Encounter (Signed)
Pt seen 06/28/13; today has dry cough, chest congestion,this morning throat got sore again and lt ear feels like fluid in it(some difficulty hearing out of lt ear). Pt has been taking Mucinex; finished Z pak today. Pt does not have CP but SOB upon exertion. Pt still has Amoxicillin 875 mg when seen in UC last weekl; should pt restart Amoxicillin or can a different antibiotic be called in for pt. [facility].Please advise. Pt request cb.

## 2013-07-02 NOTE — Telephone Encounter (Signed)
Placed on Dr. Timoteo Expose schedule at appointed time.

## 2013-07-02 NOTE — Telephone Encounter (Signed)
I called pt.  He still gets coughing fits.  His L ear sx are noted, this bothers patient.  He's gets some SOB with exertion.  ST returned today.  The cough medicine helps some.  Discussed options with pt and Dr. Reece Agar.  He can come for 10:15 OV tomorrow AM and doesn't need to go to ER now.  Would defer other treatment until pt can be seen tomorrow. Pt agrees with plan.  Please add him to the schedule for G.    Routed to PCP as FYI.

## 2013-07-03 ENCOUNTER — Ambulatory Visit (INDEPENDENT_AMBULATORY_CARE_PROVIDER_SITE_OTHER): Payer: BC Managed Care – PPO | Admitting: Family Medicine

## 2013-07-03 ENCOUNTER — Encounter: Payer: Self-pay | Admitting: Family Medicine

## 2013-07-03 VITALS — BP 124/92 | HR 88 | Temp 98.1°F | Wt 217.8 lb

## 2013-07-03 DIAGNOSIS — J209 Acute bronchitis, unspecified: Secondary | ICD-10-CM

## 2013-07-03 NOTE — Assessment & Plan Note (Signed)
Recently treated wit hzpack. Anticipate residual cough / sxs from this. Advised to give more time, update if sxs deteriorating. Pt agrees with plan. No meds prescribed today.

## 2013-07-03 NOTE — Progress Notes (Signed)
  Subjective:    Patient ID: Amori Colomb, male    DOB: [birth year], 42 y.o.   MRN: [id number]  HPI CC: f/u URI  Pt seen last week by Dr. Para March, prior to this seen at [facility], dx with strep throat and placed on amoxicillin 875mg .  RST negative.  Told WBC was elevated.    Saw Dr. Algis Downs the next week with purulent sputum production, chest pain, and worsening cough/dyspnea, placed on zpack and hycodan cough syrup.  Felt better Sunday then yesterday felt ill again.  Now feels L ear stopped up.  Having bad taste in mouth.  Yesterday with blister on roof of mouth. +PNdrainage.  Cough productive of occasional green mucous.  Some head congestion.  No recent fevers/chills, ear pain, tooth pain, headache, rashes, abd pain, nausea/vomiting, diarrhea. Has been using mucinex, hycodan cough syrup. Also on 20mg  claritin in am and zyrtec at night for h/o hives. Has finished zpack.  Daughter with mild cold recently. No h/o asthma, COPD. Ex smoker - quit 12 yrs ago.  Past Medical History  Diagnosis Date  . Hypertension     h/o LVH, resolved  . Migraines   . Seasonal allergies   . Arrhythmia     PALPITATIONS AND PVC - improved with cutting down on caffeine  . GERD (gastroesophageal reflux disease)   . 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 Corliss Skains)  . Tremor       Review of Systems Per HPI    Objective:   Physical Exam  Nursing note and vitals reviewed. Constitutional: He appears well-developed and well-nourished. No distress.  HENT:  Head: Normocephalic and atraumatic.  Right Ear: Hearing, tympanic membrane, external ear and ear canal normal.  Left Ear: Hearing, tympanic membrane, external ear and ear canal normal.  Nose: Nose normal. No mucosal edema or rhinorrhea. Right sinus exhibits no maxillary sinus tenderness and no frontal sinus tenderness. Left sinus exhibits no maxillary sinus tenderness and no frontal sinus tenderness.  Mouth/Throat: Uvula is  midline, oropharynx is clear and moist and mucous membranes are normal. No oropharyngeal exudate, posterior oropharyngeal edema, posterior oropharyngeal erythema or tonsillar abscesses.  Eyes: Conjunctivae and EOM are normal. Pupils are equal, round, and reactive to light. No scleral icterus.  Neck: Normal range of motion. Neck supple.  Cardiovascular: Normal rate, regular rhythm, normal heart sounds and intact distal pulses.   No murmur heard. Pulmonary/Chest: Effort normal and breath sounds normal. No respiratory distress. He has no wheezes. He has no rales.  Lymphadenopathy:    He has no cervical adenopathy.  Skin: Skin is warm and dry. No rash noted.       Assessment & Plan:

## 2013-07-03 NOTE — Patient Instructions (Signed)
Let's give this more time. zpack is a long lasting antibiotic. Continue medicines as up to now - hycodan and mucinex. Let me know if fever >101, worsening productive cough, or worsening headache or sinus pressure pain.

## 2013-09-13 ENCOUNTER — Emergency Department (HOSPITAL_COMMUNITY)
Admission: EM | Admit: 2013-09-13 | Discharge: 2013-09-13 | Disposition: A | Discharge status: Nursing Home (Includes ICF) | Payer: BC Managed Care – PPO | Source: Home / Self Care | Attending: Family Medicine | Admitting: Family Medicine

## 2013-09-13 ENCOUNTER — Encounter (HOSPITAL_COMMUNITY): Payer: Self-pay | Admitting: Emergency Medicine

## 2013-09-13 ENCOUNTER — Emergency Department (HOSPITAL_COMMUNITY)
Admission: EM | Admit: 2013-09-13 | Discharge: 2013-09-14 | Disposition: A | Discharge status: Home / Self Care | Payer: BC Managed Care – PPO | Attending: Emergency Medicine | Admitting: Emergency Medicine

## 2013-09-13 ENCOUNTER — Emergency Department (HOSPITAL_COMMUNITY): Payer: BC Managed Care – PPO

## 2013-09-13 ENCOUNTER — Encounter (HOSPITAL_COMMUNITY): Payer: Self-pay | Admitting: *Deleted

## 2013-09-13 DIAGNOSIS — N200 Calculus of kidney: Secondary | ICD-10-CM

## 2013-09-13 DIAGNOSIS — R109 Unspecified abdominal pain: Secondary | ICD-10-CM

## 2013-09-13 DIAGNOSIS — R1031 Right lower quadrant pain: Secondary | ICD-10-CM

## 2013-09-13 DIAGNOSIS — I10 Essential (primary) hypertension: Secondary | ICD-10-CM | POA: Insufficient documentation

## 2013-09-13 DIAGNOSIS — K219 Gastro-esophageal reflux disease without esophagitis: Secondary | ICD-10-CM | POA: Insufficient documentation

## 2013-09-13 DIAGNOSIS — M545 Low back pain, unspecified: Secondary | ICD-10-CM | POA: Insufficient documentation

## 2013-09-13 DIAGNOSIS — Z87891 Personal history of nicotine dependence: Secondary | ICD-10-CM | POA: Insufficient documentation

## 2013-09-13 DIAGNOSIS — Z9889 Other specified postprocedural states: Secondary | ICD-10-CM | POA: Insufficient documentation

## 2013-09-13 DIAGNOSIS — Z8739 Personal history of other diseases of the musculoskeletal system and connective tissue: Secondary | ICD-10-CM | POA: Insufficient documentation

## 2013-09-13 DIAGNOSIS — M549 Dorsalgia, unspecified: Secondary | ICD-10-CM

## 2013-09-13 DIAGNOSIS — Z79899 Other long term (current) drug therapy: Secondary | ICD-10-CM | POA: Insufficient documentation

## 2013-09-13 LAB — COMPREHENSIVE METABOLIC PANEL
AST: 32 U/L (ref 0–37)
BUN: 9 mg/dL (ref 6–23)
CO2: 26 mEq/L (ref 19–32)
Calcium: 9.1 mg/dL (ref 8.4–10.5)
Creatinine, Ser: 0.82 mg/dL (ref 0.50–1.35)
GFR calc Af Amer: >90 mL/min (ref >90)
GFR calc non Af Amer: >90 mL/min (ref >90)
Glucose, Bld: 94 mg/dL (ref 70–99)
Total Protein: 7.3 g/dL (ref 6.0–8.3)

## 2013-09-13 LAB — URINALYSIS, ROUTINE W REFLEX MICROSCOPIC
Leukocytes, UA: NEGATIVE
Nitrite: NEGATIVE
Protein, ur: NEGATIVE mg/dL
Specific Gravity, Urine: 1.008 (ref 1.005–1.030)
Urobilinogen, UA: 0.2 mg/dL (ref 0.0–1.0)
pH: 7 (ref 5.0–8.0)

## 2013-09-13 LAB — POCT URINALYSIS DIP (DEVICE)
Ketones, ur: NEGATIVE mg/dL
Leukocytes, UA: NEGATIVE
Protein, ur: NEGATIVE mg/dL
Specific Gravity, Urine: 1.015 (ref 1.005–1.030)
pH: 7 (ref 5.0–8.0)

## 2013-09-13 LAB — CBC WITH DIFFERENTIAL/PLATELET
Basophils Absolute: 0 10*3/uL (ref 0.0–0.1)
Basophils Relative: 1 % (ref 0–1)
Eosinophils Relative: 2 % (ref 0–5)
HCT: 43.5 % (ref 39.0–52.0)
Lymphocytes Relative: 40 % (ref 12–46)
MCHC: 36.8 g/dL — ABNORMAL HIGH (ref 30.0–36.0)
MCV: 85.3 fL (ref 78.0–100.0)
Monocytes Absolute: 0.7 10*3/uL (ref 0.1–1.0)
Monocytes Relative: 8 % (ref 3–12)
RBC: 5.1 MIL/uL (ref 4.22–5.81)
RDW: 12.7 % (ref 11.5–15.5)
WBC: 8.6 10*3/uL (ref 4.0–10.5)

## 2013-09-13 LAB — URINE MICROSCOPIC-ADD ON

## 2013-09-13 MED ORDER — IOHEXOL 300 MG/ML  SOLN
100.0000 mL | Freq: Once | INTRAMUSCULAR | Status: AC | PRN
  Administered 2013-09-13: 100 mL via INTRAVENOUS

## 2013-09-13 MED ORDER — IOHEXOL 300 MG/ML  SOLN
25.0000 mL | Freq: Once | INTRAMUSCULAR | Status: AC | PRN
  Administered 2013-09-13: 25 mL via ORAL

## 2013-09-13 NOTE — ED Provider Notes (Signed)
CSN: [id number]     Arrival date & time 09/13/13  1842 History   First MD Initiated Contact with Patient 09/13/13 2015     Chief Complaint  Patient presents with  . Back Pain  . Abdominal Pain   (Consider location/radiation/quality/duration/timing/severity/associated sxs/prior Treatment) HPI Comments: 78m with RLQ abdominal pain since Monday, getting gradually worse.  Pain radiated through to the back.  Denies fever, chills, NVD, diffuse abdominal pain.  No Hx of kidney stones.  Hx of umbilical hernia repair 3 yrs ago, no other abdominal surgeries.  Pain comes and goes but has been constant today   Patient is a 42 y.o. male presenting with back pain and abdominal pain.  Back Pain Associated symptoms: abdominal pain   Associated symptoms: no chest pain, no dysuria, no fever and no weakness   Abdominal Pain Associated symptoms include abdominal pain. Pertinent negatives include no chest pain and no shortness of breath.    Past Medical History  Diagnosis Date  . Hypertension     h/o LVH, resolved  . Migraines   . Seasonal allergies   . Arrhythmia     PALPITATIONS AND PVC - improved with cutting down on caffeine  . GERD (gastroesophageal reflux disease)   . 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 Corliss Skains)  . Tremor    Past Surgical History  Procedure Laterality Date  . Rotator cuff repair  2009    left  . Other surgical history  1992    facial surgery  . Umbilical hernia repair  05/10/2012    Procedure: HERNIA REPAIR UMBILICAL ADULT;  Surgeon: Shelly Rubenstein, MD;  Location: [facility];  Service: General;  Laterality: N/A;   Family History  Problem Relation Age of Onset  . Asthma Father   . Hypertension Brother     obese  . CAD Paternal Grandfather     MI x2  . Multiple sclerosis Brother   . Arthritis Father     father, brother, Pgrandfather  . Stroke Neg Hx   . Cancer Neg Hx    History  Substance Use Topics  .  Smoking status: Former Smoker    Quit date: 08/09/2001  . Smokeless tobacco: Current User    Types: Snuff     Comment: at work  . Alcohol Use: No     Comment: quit 2002    Review of Systems  Constitutional: Negative for fever, chills and fatigue.  HENT: Negative for sore throat, neck pain and neck stiffness.   Eyes: Negative for visual disturbance.  Respiratory: Negative for cough and shortness of breath.   Cardiovascular: Negative for chest pain, palpitations and leg swelling.  Gastrointestinal: Positive for abdominal pain. Negative for nausea, vomiting, diarrhea and constipation.  Genitourinary: Negative for dysuria, urgency, frequency and hematuria.  Musculoskeletal: Positive for back pain. Negative for myalgias and arthralgias.  Skin: Negative for rash.  Neurological: Negative for dizziness, weakness and light-headedness.    Allergies  Review of patient's allergies indicates no known allergies.  Home Medications   Current Outpatient Rx  Name  Route  Sig  Dispense  Refill  . hydrochlorothiazide (HYDRODIURIL) 25 MG tablet      TAKE 1 TABLET (25 MG TOTAL) BY MOUTH DAILY.   30 tablet   0     Contact PCP for future refills   . HYDROcodone-homatropine (HYCODAN) 5-1.5 MG/5ML syrup   Oral   Take 5 mLs by mouth every 8 (eight) hours  as needed for cough (sedation caution).   120 mL   0   . loratadine (CLARITIN) 10 MG tablet   Oral   Take 10 mg by mouth daily as needed for allergies. For seasonal allergies         . metoprolol succinate (TOPROL-XL) 50 MG 24 hr tablet   Oral   Take 50 mg by mouth daily. Take with or immediately following a meal.         . omeprazole (PRILOSEC OTC) 20 MG tablet   Oral   Take 20 mg by mouth daily.          BP 134/92  Pulse 99  Temp(Src) 97.4 F (36.3 C) (Oral)  Resp 16  SpO2 100% Physical Exam  Nursing note and vitals reviewed. Constitutional: He is oriented to person, place, and time. He appears well-developed and  well-nourished. No distress.  HENT:  Head: Normocephalic.  Pulmonary/Chest: Effort normal. No respiratory distress.  Abdominal: Normal appearance. There is no hepatosplenomegaly. There is tenderness in the right lower quadrant. There is tenderness at McBurney's point (marked). There is no rigidity, no rebound, no guarding, no CVA tenderness and negative Murphy's sign. No hernia.  Neurological: He is alert and oriented to person, place, and time. Coordination normal.  Skin: Skin is warm and dry. No rash noted. He is not diaphoretic.  Psychiatric: He has a normal mood and affect. Judgment normal.    ED Course  Procedures (including critical care time) Labs Review Labs Reviewed  POCT URINALYSIS DIP (DEVICE)   Imaging Review No results found.  MDM   1. RLQ abdominal pain   2. Back pain    Possible appendicitis.  Will need CT to rule out.  Transferred to ED    Graylon Good, PA-C 09/13/13 2034

## 2013-09-13 NOTE — ED Notes (Signed)
Pt refusing pain medications.

## 2013-09-13 NOTE — ED Provider Notes (Signed)
CSN: [id number]     Arrival date & time 09/13/13  2050 History   First MD Initiated Contact with Patient 09/13/13 2100     Chief Complaint  Patient presents with  . Abdominal Pain   (Consider location/radiation/quality/duration/timing/severity/associated sxs/prior Treatment) HPI Comments: 42 year old male with a past medical history of hypertension and GERD presents to the emergency department from urgent care Center for further evaluation and abdominal pain. Patient states he has had right lower abdominal pain x4 days, gradually increasing. Pain described as a dull pressure, sharp at present after it was palpated at urgent care. Pain radiates through to his lower back. Denies nausea, vomiting, diarrhea, dysuria, increased urinary frequency, urgency or dysuria, fever or chills. Denies history of kidney stones. Urgent care concerned of acute appendicitis. Denies change in appetite. Denies testicular pain or swelling. Has a history of umbilical hernia surgery in May 2013. Denies hx of kidney stones, however after chart review he had a CT scan from June 2013 showing 4mm non obstructing stone on right.  Patient is a 42 y.o. male presenting with abdominal pain. The history is provided by the patient and the spouse.  Abdominal Pain Associated symptoms: no chest pain, no chills, no constipation, no diarrhea, no dysuria, no fever, no nausea, no shortness of breath and no vomiting     Past Medical History  Diagnosis Date  . Hypertension     h/o LVH, resolved  . Migraines   . Seasonal allergies   . Arrhythmia     PALPITATIONS AND PVC - improved with cutting down on caffeine  . GERD (gastroesophageal reflux disease)   . 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 Corliss Skains)  . Tremor    Past Surgical History  Procedure Laterality Date  . Rotator cuff repair  2009    left  . Other surgical history  1992    facial surgery  . Umbilical hernia repair  05/10/2012     Procedure: HERNIA REPAIR UMBILICAL ADULT;  Surgeon: Shelly Rubenstein, MD;  Location: [facility];  Service: General;  Laterality: N/A;   Family History  Problem Relation Age of Onset  . Asthma Father   . Hypertension Brother     obese  . CAD Paternal Grandfather     MI x2  . Multiple sclerosis Brother   . Arthritis Father     father, brother, Pgrandfather  . Stroke Neg Hx   . Cancer Neg Hx    History  Substance Use Topics  . Smoking status: Former Smoker    Quit date: 08/09/2001  . Smokeless tobacco: Current User    Types: Snuff     Comment: at work  . Alcohol Use: No     Comment: quit 2002    Review of Systems  Constitutional: Negative for fever and chills.  Respiratory: Negative for shortness of breath.   Cardiovascular: Negative for chest pain.  Gastrointestinal: Positive for abdominal pain. Negative for nausea, vomiting, diarrhea and constipation.  Genitourinary: Negative for dysuria, urgency, frequency, flank pain, decreased urine volume, scrotal swelling, difficulty urinating, penile pain and testicular pain.  Musculoskeletal: Positive for back pain.  All other systems reviewed and are negative.    Allergies  Review of patient's allergies indicates no known allergies.  Home Medications   Current Outpatient Rx  Name  Route  Sig  Dispense  Refill  . cetirizine (ZYRTEC) 10 MG tablet   Oral   Take 10 mg by mouth  daily after supper.         . hydrochlorothiazide (HYDRODIURIL) 25 MG tablet   Oral   Take 25 mg by mouth daily.         Marland Kitchen loratadine (CLARITIN) 10 MG tablet   Oral   Take 20 mg by mouth daily. For seasonal allergies         . metoprolol succinate (TOPROL-XL) 50 MG 24 hr tablet   Oral   Take 50 mg by mouth daily. Take with or immediately following a meal.         . mupirocin ointment (BACTROBAN) 2 %   Topical   Apply topically daily as needed (for wound care).         . naproxen sodium (ANAPROX) 550 MG tablet    Oral   Take 550 mg by mouth daily as needed (pain).         Marland Kitchen omeprazole (PRILOSEC OTC) 20 MG tablet   Oral   Take 20 mg by mouth daily.          BP 141/97  Pulse 92  Temp(Src) 97.7 F (36.5 C) (Oral)  Resp 16  SpO2 97% Physical Exam  Nursing note and vitals reviewed. Constitutional: He is oriented to person, place, and time. He appears well-developed and well-nourished. No distress.  HENT:  Head: Normocephalic and atraumatic.  Mouth/Throat: Oropharynx is clear and moist.  Eyes: Conjunctivae are normal.  Neck: Normal range of motion. Neck supple.  Cardiovascular: Normal rate, regular rhythm and normal heart sounds.   Pulmonary/Chest: Effort normal and breath sounds normal.  Abdominal: Soft. Bowel sounds are normal. There is tenderness in the right lower quadrant. There is tenderness at McBurney's point. There is no rigidity, no rebound, no guarding and no CVA tenderness.  No peritoneal signs.  Musculoskeletal: Normal range of motion. He exhibits no edema.  Neurological: He is alert and oriented to person, place, and time.  Skin: Skin is warm and dry. He is not diaphoretic.  Psychiatric: He has a normal mood and affect. His behavior is normal.    ED Course  Procedures (including critical care time) Labs Review Labs Reviewed  CBC WITH DIFFERENTIAL - Abnormal; Notable for the following:    MCHC 36.8 (*)    All other components within normal limits  COMPREHENSIVE METABOLIC PANEL - Abnormal; Notable for the following:    Potassium 3.3 (*)    All other components within normal limits  URINALYSIS, ROUTINE W REFLEX MICROSCOPIC - Abnormal; Notable for the following:    APPearance TURBID (*)    All other components within normal limits  URINE MICROSCOPIC-ADD ON   Imaging Review Ct Abdomen Pelvis W Contrast  09/14/2013   CLINICAL DATA:  Right lower abdominal and back pain. History of prostatitis.  EXAM: CT ABDOMEN AND PELVIS WITH CONTRAST  TECHNIQUE: Multidetector CT imaging  of the abdomen and pelvis was performed using the standard protocol following bolus administration of intravenous contrast.  CONTRAST:  OMNIPAQUE IOHEXOL 300 MG/ML  SOLN  COMPARISON:  Abdominal pelvic CT 06/03/2012.  FINDINGS: There is stable mild scarring in the lingula. The lung bases are otherwise clear. There is no pleural or pericardial effusion.  Diffuse hepatic steatosis is again noted. There is no focal liver lesion. The gallbladder, biliary system and pancreas appear normal. The spleen and adrenal glands appear normal. There is a nonobstructing calculus in the mid right kidney which appears unchanged. There is no hydronephrosis or ureteral calculus.  The stomach, small bowel, appendix  and colon demonstrate no significant findings. The previously demonstrated fluid collection associated with the umbilicus is no longer visualized. The bladder, prostate gland and seminal vesicles appear unchanged. There is mildly prominent fat within both inguinal canals.  There are no acute or significant osseous findings.  IMPRESSION: 1. No acute abdominal findings or explanation for the patient's symptoms. 2. Interval resolution of fluid collection associate with the umbilicus. 3. Hepatic steatosis and a nonobstructing right renal calculus again noted.   Electronically Signed   By: Roxy Horseman   On: 09/14/2013 00:11    MDM   1. Abdominal pain   2. Nephrolithiasis     Patient with right lower quadrant abdominal pain, sent over from urgent care. He is well appearing and in no apparent distress. No peritoneal signs on exam. Tenderness right lower quadrant and McBurney's point. No CVA tenderness. No urinary symptoms. Labs obtained, CBC, CMP and urine. No leukocytosis. Urine without infection. Pain has been worsening over the past few days, possible developing early appendicitis vs kidney stone. He is refusing any pain medication at this time. CT abdomen/pelvis pending. 12:30 AM CT with results as shown above.  He has a 4 mm nonobstructing renal calculus, unchanged from CT obtained in June 2013. No hydronephrosis, no appendicitis or any other acute finding. He is still resting comfortably on exam. Abdomen soft, tender in the right lower quadrant. No peritoneal signs. He will be discharged with 10 Vicodin, followup with PCP. Return precautions discussed. Patient states understanding of plan and is agreeable.  Trevor Mace, PA-C 09/14/13 [id number]

## 2013-09-13 NOTE — ED Notes (Signed)
Right low back pain and right low abdominal pain, radiating up right side.  Last bm was 2:00 p.m. and normal per patient.  Denies any blood visible in urine, no nausea, no vomiting.  Reports having had a prostate infection 2-3 months ago

## 2013-09-13 NOTE — ED Notes (Addendum)
Pt states that he is having lower right abdominal pain. Pt states that he was seen at [facility] and sent down to rule out kidney stone or appendicitis. Pt states that pressing on the pain at [facility] increased his pain. Pt states lower back pain as well but very mild. Pt denies problems with urine or bowels. Pt denies fever or chills. Pt had urine at [facility] but did not have blood work.

## 2013-09-14 MED ORDER — HYDROCODONE-ACETAMINOPHEN 5-325 MG PO TABS: 1.0000 | ORAL_TABLET | Freq: Four times a day (QID) | ORAL | Status: DC | PRN

## 2013-09-14 NOTE — ED Provider Notes (Signed)
Medical screening examination/treatment/procedure(s) were performed by non-physician practitioner and as supervising physician I was immediately available for consultation/collaboration.   Junius Argyle, MD 09/14/13 1322

## 2013-09-14 NOTE — ED Provider Notes (Signed)
Medical screening examination/treatment/procedure(s) were performed by resident physician or non-physician practitioner and as supervising physician I was immediately available for consultation/collaboration.   Geoff Dacanay DOUGLAS MD.   Caelum Federici D Lyndsy Gilberto, MD 09/14/13 0944 

## 2013-09-17 ENCOUNTER — Ambulatory Visit (INDEPENDENT_AMBULATORY_CARE_PROVIDER_SITE_OTHER): Payer: BC Managed Care – PPO | Admitting: Family Medicine

## 2013-09-17 ENCOUNTER — Encounter: Payer: Self-pay | Admitting: Family Medicine

## 2013-09-17 ENCOUNTER — Other Ambulatory Visit: Payer: Self-pay | Admitting: *Deleted

## 2013-09-17 VITALS — BP 148/96 | HR 88 | Temp 98.2°F | Wt 225.5 lb

## 2013-09-17 DIAGNOSIS — I10 Essential (primary) hypertension: Secondary | ICD-10-CM

## 2013-09-17 DIAGNOSIS — K760 Fatty (change of) liver, not elsewhere classified: Secondary | ICD-10-CM

## 2013-09-17 DIAGNOSIS — Z23 Encounter for immunization: Secondary | ICD-10-CM

## 2013-09-17 DIAGNOSIS — M545 Low back pain, unspecified: Secondary | ICD-10-CM

## 2013-09-17 DIAGNOSIS — K7689 Other specified diseases of liver: Secondary | ICD-10-CM

## 2013-09-17 MED ORDER — AMLODIPINE BESYLATE 5 MG PO TABS: 5.0000 mg | ORAL_TABLET | Freq: Every day | ORAL | Status: DC

## 2013-09-17 MED ORDER — CYCLOBENZAPRINE HCL 10 MG PO TABS: 10.0000 mg | ORAL_TABLET | Freq: Two times a day (BID) | ORAL | Status: DC | PRN

## 2013-09-17 MED ORDER — METOPROLOL SUCCINATE ER 50 MG PO TB24: 50.0000 mg | ORAL_TABLET | Freq: Every day | ORAL | Status: DC

## 2013-09-17 NOTE — Progress Notes (Signed)
  Subjective:    Patient ID: Jordan Buchanan, male    DOB: January 17, 1971, 42 y.o.   MRN: [id number]  HPI CC: med refill  HTN - No HA, vision changes, chest tightness, SOB, leg swelling.  Also stopped HCTZ (2/2 see below).  Compliant with metoprolol XL.  Itchy skin - saw derm - told likely sun allergy.  Rec stop HCTZ.  May have helped some.  Tries to avoid sun, using 100 SPF sunscreen   Recent eval at ER with lower right back pain and RLQ pain - CT scan negative for appendicitis, found non obstructing R kidney stone.  Describes gradually worsening RLQ pain over 1 week prior to seeking care at ER.  Denies hip pain.  No fevers/chills, BM changes, diarrhea, nausea/vomiting.  Takes fiber regularly for this.  Pain seems to worsen with activity.   H/o HLAB27 + ?spondyloarthropathy, has not recently seen rheum (Deveshwar). Prior on Humira.  Fatty liver - finding on CT scan.  discussed with patient.  Works for [employer], outdoors.  R great toe ingrown nail, longstanding.  Asks about recommendation for podiatrist.  Past Medical History  Diagnosis Date  . Hypertension     h/o LVH, resolved  . Migraines   . Seasonal allergies   . Arrhythmia     PALPITATIONS AND PVC - improved with cutting down on caffeine  . GERD (gastroesophageal reflux disease)   . 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 Corliss Skains)  . Tremor   . Hepatic steatosis      Review of Systems Per HPI    Objective:   Physical Exam  Nursing note and vitals reviewed. Constitutional: He appears well-developed and well-nourished. No distress.  HENT:  Head: Normocephalic and atraumatic.  Mouth/Throat: Oropharynx is clear and moist. No oropharyngeal exudate.  Eyes: Conjunctivae and EOM are normal. Pupils are equal, round, and reactive to light. No scleral icterus.  Neck: Normal range of motion. Neck supple.  Cardiovascular: Normal rate, regular rhythm, normal heart sounds and intact distal  pulses.   No murmur heard. Pulmonary/Chest: Effort normal and breath sounds normal. No respiratory distress. He has no wheezes. He has no rales.  Abdominal: Soft. Normal appearance and bowel sounds are normal. He exhibits no distension and no mass. There is no hepatosplenomegaly. There is tenderness (mild to deep palpation) in the right lower quadrant. There is no rigidity, no rebound, no guarding, no CVA tenderness and negative Murphy's sign.  Musculoskeletal: He exhibits no edema.  No midline spine tenderness Tender at right paraspinous mm as well as superior R SIJ, and knot appreciated just above R SIJ Neg SLR bilaterally, no pain with int/ext rotation at hip, neg FABER  Skin: Skin is warm and dry.  Psychiatric: He has a normal mood and affect.       Assessment & Plan:

## 2013-09-17 NOTE — Patient Instructions (Addendum)
Flu shot today I wonder about musculoskeletal cause of this back and abdominal pain - treat with naprosyn at home - twice daily with food for 1 week then as needed, also start flexeril 10mg  twice daily as needed (sedation precautions). Start amlodipine 5mg  daily for blood pressure. Return to see me in 1 month for follow up blood pressure. Check to see Dr. Ralene Cork podiatrist.

## 2013-09-18 DIAGNOSIS — K760 Fatty (change of) liver, not elsewhere classified: Secondary | ICD-10-CM | POA: Insufficient documentation

## 2013-09-18 DIAGNOSIS — M545 Low back pain, unspecified: Secondary | ICD-10-CM | POA: Insufficient documentation

## 2013-09-18 NOTE — Assessment & Plan Note (Signed)
Reviewed CT scan diagnosis with patient- merits close f/u

## 2013-09-18 NOTE — Assessment & Plan Note (Addendum)
Along with RLQ discomfort - w/u from ER reviewed.  Overall negative for appendicitis, did find nonobstructing R kidney stone but I doubt this pain is related. In h/o spondyloarthropathy, and reprodicuble pain at R lower back, ?R sacroiliitis.   Recommended treat with flexeril, naprosyn.  Sedation precautions on flexeril discussed, may cut in 1/2 if needed. Consider return to rheum if not better.  Consider SIJ xray next visit if not better.  Pt agrees with plan.

## 2013-09-18 NOTE — Assessment & Plan Note (Signed)
Chronic. Deteriorated off HCTZ (by derm recommendations) Start amlodipine 5mg , return in 1 month for f/u.

## 2013-09-24 ENCOUNTER — Encounter: Payer: Self-pay | Admitting: Family Medicine

## 2013-09-24 ENCOUNTER — Ambulatory Visit (INDEPENDENT_AMBULATORY_CARE_PROVIDER_SITE_OTHER): Payer: BC Managed Care – PPO | Admitting: Family Medicine

## 2013-09-24 ENCOUNTER — Encounter: Payer: Self-pay | Admitting: *Deleted

## 2013-09-24 VITALS — BP 122/88 | HR 88 | Temp 98.2°F | Wt 222.5 lb

## 2013-09-24 DIAGNOSIS — H10029 Other mucopurulent conjunctivitis, unspecified eye: Secondary | ICD-10-CM

## 2013-09-24 DIAGNOSIS — H10022 Other mucopurulent conjunctivitis, left eye: Secondary | ICD-10-CM | POA: Insufficient documentation

## 2013-09-24 MED ORDER — ERYTHROMYCIN 5 MG/GM OP OINT: TOPICAL_OINTMENT | Freq: Three times a day (TID) | OPHTHALMIC | Status: DC

## 2013-09-24 NOTE — Assessment & Plan Note (Signed)
Anticipate conjunctivitis of left eye - treat with romycin ointment. Less likely uveitis given endorsed discharge from eye, however given h/o spondyloarthropathies, if no better in 2 days or any worsening ,discussed referral to ophtho.  Pt agrees with plan.

## 2013-09-24 NOTE — Progress Notes (Signed)
  Subjective:    Patient ID: Jordan Buchanan, male    DOB: Mar 20, 1971, 42 y.o.   MRN: [id number]  HPI CC: L pink eye?  1 night h/o ?L eye irritation.  Felt pressure in L eye, associated with persistent green discharge of left eye.  Some matting of L eyelid.  Mild injection of eye. No vision changes. Has treated with otc allergy eye drops.  No sick contacts at home.  H/o Spondyloarthropathy: ?AS, HLA-B27 +, CCP neg, prior on humira (Dr. Corliss Skains)  Recent R side pain improved.  Today is his wedding anniversary  Past Medical History  Diagnosis Date  . Hypertension     h/o LVH, resolved  . Migraines   . Seasonal allergies   . Arrhythmia     PALPITATIONS AND PVC - improved with cutting down on caffeine  . GERD (gastroesophageal reflux disease)   . 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 Corliss Skains)  . Tremor   . Hepatic steatosis      Review of Systems Per HPI    Objective:   Physical Exam  Nursing note and vitals reviewed. Constitutional: He appears well-developed and well-nourished. No distress.  HENT:  Head: Normocephalic and atraumatic.  Eyes: EOM are normal. Pupils are equal, round, and reactive to light. Right eye exhibits no discharge, no exudate and no hordeolum. No foreign body present in the right eye. Left eye exhibits discharge (watery). Left eye exhibits no exudate and no hordeolum. No foreign body present in the left eye. Right conjunctiva is not injected. Left conjunctiva is injected. No scleral icterus.  Mild L bulbar conjunctival injection with limbic sparing       Assessment & Plan:

## 2013-09-24 NOTE — Patient Instructions (Addendum)
I do think this is conjunctivitis or pink eye - treat with antibiotic eye ointment.  If not better over next 2 days let me know for referral to eye doctor. If not better let me know sooner. Good to see you today, call us with questions.  Conjunctivitis Conjunctivitis is commonly called "pink eye." Conjunctivitis can be caused by bacterial or viral infection, allergies, or injuries. There is usually redness of the lining of the eye, itching, discomfort, and sometimes discharge. There may be deposits of matter along the eyelids. A viral infection usually causes a watery discharge, while a bacterial infection causes a yellowish, thick discharge. Pink eye is very contagious and spreads by direct contact. You may be given antibiotic eyedrops as part of your treatment. Before using your eye medicine, remove all drainage from the eye by washing gently with warm water and cotton balls. Continue to use the medication until you have awakened 2 mornings in a row without discharge from the eye. Do not rub your eye. This increases the irritation and helps spread infection. Use separate towels from other household members. Wash your hands with soap and water before and after touching your eyes. Use cold compresses to reduce pain and sunglasses to relieve irritation from light. Do not wear contact lenses or wear eye makeup until the infection is gone. SEEK MEDICAL CARE IF:   Your symptoms are not better after 3 days of treatment.  You have increased pain or trouble seeing.  The outer eyelids become very red or swollen. Document Released: 01/13/2005 Document Revised: 02/28/2012 Document Reviewed: 12/06/2005 [facility] Patient Information 2014 [place], [place].

## 2013-09-27 ENCOUNTER — Telehealth: Payer: Self-pay | Admitting: Family Medicine

## 2013-09-27 DIAGNOSIS — H10022 Other mucopurulent conjunctivitis, left eye: Secondary | ICD-10-CM

## 2013-09-27 DIAGNOSIS — M1990 Unspecified osteoarthritis, unspecified site: Secondary | ICD-10-CM

## 2013-09-27 NOTE — Telephone Encounter (Signed)
I see pt is scheduled for f/u for Friday afternoon - please call and offer ophtho referral as no better - no need to come in unless pt desires it.

## 2013-09-28 ENCOUNTER — Ambulatory Visit: Payer: BC Managed Care – PPO | Admitting: Family Medicine

## 2013-09-28 NOTE — Telephone Encounter (Signed)
Jordan Buchanan got patient scheduled with ophtho today. Appt cancelled.

## 2013-10-25 ENCOUNTER — Encounter: Payer: Self-pay | Admitting: Family Medicine

## 2013-10-25 ENCOUNTER — Ambulatory Visit (INDEPENDENT_AMBULATORY_CARE_PROVIDER_SITE_OTHER): Payer: BC Managed Care – PPO | Admitting: Family Medicine

## 2013-10-25 VITALS — BP 128/86 | HR 75 | Temp 97.3°F | Wt 227.8 lb

## 2013-10-25 DIAGNOSIS — J069 Acute upper respiratory infection, unspecified: Secondary | ICD-10-CM

## 2013-10-25 MED ORDER — AMOXICILLIN-POT CLAVULANATE 875-125 MG PO TABS: 1.0000 | ORAL_TABLET | Freq: Two times a day (BID) | ORAL | Status: DC

## 2013-10-25 NOTE — Progress Notes (Signed)
Pre-visit discussion using our clinic review tool. No additional management support is needed unless otherwise documented below in the visit note.  Recently with sx, 2-3 days of sx.  Using neti pot with some relief initially, then couldn't get any fluid to pass today since he was so stuffy.  No fevers documented but felt hot.  Some sweats and chills. Fatigued.  Minimal cough. Using otc cold meds.  No ear pain.  Frontal HA.  He has some nasacort but hasn't used it in the last week.  Mult sick contacts.   He has been mulching at work.    Meds, vitals, and allergies reviewed.   ROS: See HPI.  Otherwise, noncontributory.  GEN: nad, alert and oriented HEENT: mucous membranes moist, tm w/o erythema, nasal exam w/o erythema, clear discharge noted,  OP with mild cobblestoning, sinuses not ttp x4 NECK: supple w/o LA CV: rrr.   PULM: ctab, no inc wob EXT: no edema SKIN: no acute rash

## 2013-10-25 NOTE — Patient Instructions (Signed)
Use the neti pot and the nascort for now.  If not better in a few days, then start the augmentin. Take care.

## 2013-10-25 NOTE — Assessment & Plan Note (Signed)
Likely viral, would use neti pot and OTC nasal steroid for now. [facility]P rx for augmentin, he'll hold for a few days. Nontoxic.  F/u prn.  He agrees.

## 2014-01-15 ENCOUNTER — Other Ambulatory Visit: Payer: Self-pay | Admitting: Family Medicine

## 2014-01-22 ENCOUNTER — Other Ambulatory Visit: Payer: Self-pay | Admitting: Family Medicine

## 2014-04-18 ENCOUNTER — Encounter: Payer: Self-pay | Admitting: Family Medicine

## 2014-04-18 ENCOUNTER — Ambulatory Visit (INDEPENDENT_AMBULATORY_CARE_PROVIDER_SITE_OTHER): Payer: BC Managed Care – PPO | Admitting: Family Medicine

## 2014-04-18 VITALS — BP 144/90 | HR 76 | Temp 97.6°F | Wt 229.0 lb

## 2014-04-18 DIAGNOSIS — L299 Pruritus, unspecified: Secondary | ICD-10-CM | POA: Insufficient documentation

## 2014-04-18 LAB — TSH: TSH: 0.49 u[IU]/mL (ref 0.35–5.50)

## 2014-04-18 NOTE — Progress Notes (Signed)
Pre visit review using our clinic review tool, if applicable. No additional management support is needed unless otherwise documented below in the visit note. 

## 2014-04-18 NOTE — Patient Instructions (Signed)
Let's check thyroid function. Will await biopsy results. Does sound like sun allergy/sensitivity. Continue neutrogena sunscreen.  Consider polo shirt with collar, make sure you wear cotton.

## 2014-04-18 NOTE — Progress Notes (Signed)
   BP 144/90  Pulse 76  Temp(Src) 97.6 F (36.4 C) (Oral)  Wt 229 lb (103.874 kg)   CC: skin irritation around neck  Subjective:    Patient ID: Jordan Buchanan, male    DOB: [birth year], 43 y.o.   MRN: [id number]  HPI: Jordan Buchanan is a 43 y.o. male presenting on 04/18/2014 for Acute Visit   Saw dermatologist Amber PA at Dr. Juel Burrow office.  Had skin biopsy of possible AK.  Prescribed hydroxyzine 25mg  prn itching.  Chronic focal itching on left side of neck for last 1-2 years.  Uses clobetasol cream for itching. Prior thought HCTZ irritating possible sun allergy.  This was changed to amlodipine and metoprolol but hasn't helped. Yesterday noticed bruise anterior L neckline.  Relevant past medical, surgical, family and social history reviewed and updated as indicated.  Allergies and medications reviewed and updated. Current Outpatient Prescriptions on File Prior to Visit  Medication Sig  . amLODipine (NORVASC) 5 MG tablet TAKE 1 TABLET (5 MG TOTAL) BY MOUTH DAILY.  . cetirizine (ZYRTEC) 10 MG tablet Take 10 mg by mouth daily after supper.  . metoprolol succinate (TOPROL-XL) 50 MG 24 hr tablet TAKE 1 TABLET (50 MG TOTAL) BY MOUTH DAILY. TAKE WITH OR IMMEDIATELY FOLLOWING A MEAL.  . naproxen sodium (ANAPROX) 550 MG tablet Take 550 mg by mouth daily as needed (pain).  Marland Kitchen omeprazole (PRILOSEC OTC) 20 MG tablet Take 20 mg by mouth daily.  Marland Kitchen amoxicillin-clavulanate (AUGMENTIN) 875-125 MG per tablet Take 1 tablet by mouth 2 (two) times daily.   No current facility-administered medications on file prior to visit.    Review of Systems Per HPI unless specifically indicated above    Objective:    BP 144/90  Pulse 76  Temp(Src) 97.6 F (36.4 C) (Oral)  Wt 229 lb (103.874 kg)  Physical Exam  Nursing note and vitals reviewed. Constitutional: He appears well-developed and well-nourished. No distress.  Neck: No thyromegaly present.  Skin: Skin is warm and dry. No rash noted.  Bandaid over  today's shave biopsy left neck. No other skin rash, slight erythema around neck       Assessment & Plan:   Problem List Items Addressed This Visit   Pruritic condition - Primary     Focal L neck pruritis associated with skin lesion s/p biopsy this morning. Will check TSH as has not had checked. Discussed likely more skin irritant etiology ?sun allergy/sensitivity. Continue treatment as up to now with clobetasol, hydroxyzine, sunscreen, polo shirt, cotton shirts.    Relevant Orders      TSH       Follow up plan: Return if symptoms worsen or fail to improve.

## 2014-04-18 NOTE — Assessment & Plan Note (Signed)
Focal L neck pruritis associated with skin lesion s/p biopsy this morning. Will check TSH as has not had checked. Discussed likely more skin irritant etiology ?sun allergy/sensitivity. Continue treatment as up to now with clobetasol, hydroxyzine, sunscreen, polo shirt, cotton shirts.

## 2014-04-19 ENCOUNTER — Ambulatory Visit: Payer: BC Managed Care – PPO

## 2014-04-19 DIAGNOSIS — L299 Pruritus, unspecified: Secondary | ICD-10-CM

## 2014-04-22 ENCOUNTER — Telehealth: Payer: Self-pay | Admitting: Family Medicine

## 2014-04-22 LAB — T4, FREE: Free T4: 0.89 ng/dL (ref 0.60–1.60)

## 2014-04-22 MED ORDER — AMOXICILLIN 875 MG PO TABS: 875.0000 mg | ORAL_TABLET | Freq: Two times a day (BID) | ORAL | Status: DC

## 2014-04-22 NOTE — Telephone Encounter (Signed)
Spoke with patient and notified him of Rx. He said he went to [facility] and tested negative for strep, so he will hold off on the abx for now. He was appreciative, though. He said [facility] looked at all sites for an appt, but there was no appt available anywhere, which is why he went to [facility].

## 2014-04-22 NOTE — Telephone Encounter (Signed)
Looks like pt was recommended to be seen in office today but no appt scheduled.  Routed to [place] to f/u with [facility] as to why no appt was made nor recs to contact pt. With severe ST and sick contact with strep, ok to send in amoxicillin course - plz notify patient this was sent in.

## 2014-04-22 NOTE — Telephone Encounter (Signed)
Patient Information:  Caller Name: Jedrek  Phone: [phone]  Patient: Alexandr, Yaworski  Gender: Male  DOB: May 19, 1971  Age: 43 Years  PCP: Ria Bush [facility])  Office Follow Up:  Does the office need to follow up with this patient?: No  Instructions For The Office: N/A   Symptoms  Reason For Call & Symptoms: Pt states he has severe sore throat and daughter has been diagnosed with Strep Throat.  Reviewed Health History In EMR: Yes  Reviewed Medications In EMR: Yes  Reviewed Allergies In EMR: Yes  Reviewed Surgeries / Procedures: Yes  Date of Onset of Symptoms: 04/21/2014  Guideline(s) Used:  Sore Throat  Disposition Per Guideline:   See Today in Office  Reason For Disposition Reached:   Severe sore throat pain  Advice Given:  Call Back If:  You become worse.  Patient Will Follow Care Advice:  YES

## 2014-04-22 NOTE — Telephone Encounter (Signed)
I sent this question you had on this patient about [facility] not scheduling for an appt to the head of nursing at [facility].

## 2014-04-22 NOTE — Telephone Encounter (Signed)
Noted. Thanks.  [facility] didn't document they sent him to [facility].

## 2014-04-25 ENCOUNTER — Encounter: Payer: Self-pay | Admitting: Internal Medicine

## 2014-04-25 ENCOUNTER — Ambulatory Visit (INDEPENDENT_AMBULATORY_CARE_PROVIDER_SITE_OTHER): Payer: BC Managed Care – PPO | Admitting: Internal Medicine

## 2014-04-25 ENCOUNTER — Ambulatory Visit (INDEPENDENT_AMBULATORY_CARE_PROVIDER_SITE_OTHER)
Admission: RE | Admit: 2014-04-25 | Discharge: 2014-04-25 | Disposition: A | Discharge status: Home / Self Care | Payer: BC Managed Care – PPO | Source: Ambulatory Visit | Attending: Internal Medicine | Admitting: Internal Medicine

## 2014-04-25 VITALS — BP 128/90 | HR 103 | Temp 98.5°F | Wt 226.0 lb

## 2014-04-25 DIAGNOSIS — R059 Cough, unspecified: Secondary | ICD-10-CM

## 2014-04-25 DIAGNOSIS — J309 Allergic rhinitis, unspecified: Secondary | ICD-10-CM

## 2014-04-25 DIAGNOSIS — J209 Acute bronchitis, unspecified: Secondary | ICD-10-CM

## 2014-04-25 DIAGNOSIS — R05 Cough: Secondary | ICD-10-CM

## 2014-04-25 MED ORDER — FLUTICASONE PROPIONATE 50 MCG/ACT NA SUSP: 2.0000 | Freq: Every day | NASAL | Status: DC

## 2014-04-25 MED ORDER — HYDROCODONE-HOMATROPINE 5-1.5 MG/5ML PO SYRP: 5.0000 mL | ORAL_SOLUTION | Freq: Three times a day (TID) | ORAL | Status: DC | PRN

## 2014-04-25 NOTE — Progress Notes (Signed)
HPI  Pt presents to the clinic today with c/o cough, chest congestion and sore throat. He reports this started. His cough is productive of green/brown mucous. He denies fever, but has been having chills and body aches. He has tried Mucinex without relief. He went to [facility] on Monday for the same and given Augmentin. He has not yet finished that course of abx. He reports he feels worse than before. He has had sick contacts, daughter had strep (he did have a negative strep at [facility]).  Review of Systems      Past Medical History  Diagnosis Date  . Hypertension     h/o LVH, resolved  . Migraines   . Seasonal allergies   . Arrhythmia     PALPITATIONS AND PVC - improved with cutting down on caffeine  . GERD (gastroesophageal reflux disease)   . 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 Estanislado Pandy)  . Tremor   . Hepatic steatosis     Family History  Problem Relation Age of Onset  . Asthma Father   . Hypertension Brother     obese  . CAD Paternal Grandfather     MI x2  . Multiple sclerosis Brother   . Arthritis Father     father, brother, Pgrandfather  . Stroke Neg Hx   . Cancer Neg Hx     History   Social History  . Marital Status: Married    Spouse Name: N/A    Number of Children: 2  . Years of Education: N/A   Occupational History  . golf course maintenance    Social History Main Topics  . Smoking status: Former Smoker    Quit date: 08/09/2001  . Smokeless tobacco: Current User    Types: Snuff     Comment: at work  . Alcohol Use: No     Comment: quit 2002  . Drug Use: Yes    Special: Marijuana     Comment: quit 2002  . Sexual Activity: Not on file   Other Topics Concern  . 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  HEENT:  Positive sore throat. Denies eye redness, eye pain, pressure behind the eyes, facial pain, nasal congestion, ear pain, ringing in the ears, wax buildup, runny nose or bloody nose. Respiratory: Positive cough. Denies difficulty breathing or shortness of breath.  Cardiovascular: Denies chest pain, chest tightness, palpitations or swelling in the hands or feet.   No other specific complaints in a complete review of systems (except as listed in HPI above).  Objective:   BP 128/90  Pulse 103  Temp(Src) 98.5 F (36.9 C) (Oral)  Wt 226 lb (102.513 kg)  SpO2 97% Wt Readings from Last 3 Encounters:  04/25/14 226 lb (102.513 kg)  04/18/14 229 lb (103.874 kg)  10/25/13 227 lb 12 oz (103.307 kg)     General: Appears his stated age, well developed, well nourished in NAD. HEENT: Head: normal shape and size; Eyes: sclera white, no icterus, conjunctiva pink, PERRLA and EOMs intact; Ears: Tm's gray and intact, normal light reflex; Nose: mucosa boggy and moist, septum midline; Throat/Mouth: + PND. Teeth  present, mucosa erythematous and moist, no exudate noted, no lesions or ulcerations noted.  Neck: Neck supple, trachea midline. No massses, lumps or thyromegaly present.  Cardiovascular: Normal rate and rhythm. S1,S2 noted.  No murmur, rubs or gallops noted. No JVD or BLE edema. No carotid bruits noted. Pulmonary/Chest: Normal effort and positive vesicular breath sounds, diminished in the RLL. No respiratory distress. No wheezes, rales or ronchi noted.      Assessment & Plan:   Acute bronchitis:  Get some rest and drink plenty of water Do salt water gargles for the sore throat Already on augmentin-finish Augmentin Will check chest xray today to r/o pneumonia eRx for hycodan and flonase  RTC as needed or if symptoms persist.

## 2014-04-25 NOTE — Patient Instructions (Addendum)

## 2014-04-25 NOTE — Progress Notes (Signed)
Pre visit review using our clinic review tool, if applicable. No additional management support is needed unless otherwise documented below in the visit note. 

## 2014-05-14 ENCOUNTER — Encounter: Payer: Self-pay | Admitting: Family Medicine

## 2014-05-14 ENCOUNTER — Ambulatory Visit (INDEPENDENT_AMBULATORY_CARE_PROVIDER_SITE_OTHER): Payer: BC Managed Care – PPO | Admitting: Family Medicine

## 2014-05-14 VITALS — BP 118/80 | HR 70 | Temp 98.1°F | Wt 224.5 lb

## 2014-05-14 DIAGNOSIS — R059 Cough, unspecified: Secondary | ICD-10-CM

## 2014-05-14 DIAGNOSIS — R05 Cough: Secondary | ICD-10-CM

## 2014-05-14 DIAGNOSIS — J209 Acute bronchitis, unspecified: Secondary | ICD-10-CM

## 2014-05-14 MED ORDER — AZITHROMYCIN 250 MG PO TABS: ORAL_TABLET | ORAL | Status: DC

## 2014-05-14 MED ORDER — HYDROCODONE-HOMATROPINE 5-1.5 MG/5ML PO SYRP: 5.0000 mL | ORAL_SOLUTION | Freq: Three times a day (TID) | ORAL | Status: DC | PRN

## 2014-05-14 NOTE — Progress Notes (Addendum)
BP 118/80  Pulse 70  Temp(Src) 98.1 F (36.7 C) (Oral)  Wt 224 lb 8 oz (101.833 kg)  SpO2 97%   CC: cough  Subjective:    Patient ID: Jordan Buchanan, male    DOB: 1971/06/22, 43 y.o.   MRN: [id number]  HPI: Jordan Buchanan is a 43 y.o. male presenting on 05/14/2014 for Cough   3d ago started noticing chest tightness/soreness worse with cough.  Developed cough, difficulty swallowing, chest discomfort worse with deep breath, some flank pain as well.  Yesterday had bad nosebleed. Overall dry cough.  Mild PNdrainage.  Trouble sleeping last night 2/2 cough and chest discomfort. Chest discomfort described as tight sore ache.  No fevers/chills, congestion, rhinorrhea, denies wheezing.  No HA, ear or tooth pain. No recent travel or immobility.  No personal or fmhx blood clots.  Worked all weekend long. Tried mucinex which didn't help much. Recent bronchitis treated with amoxicillin early May.  Seen here in f/u with stable CXR.  Did feel better prior to starting to feel ill again.  Daughter with virus 1.5 wks ago, with fever and dry cough. No h/o asthma.  + h/o allergic rhinitis.  Past Medical History  Diagnosis Date  . Hypertension     h/o LVH, resolved  . Migraines   . Seasonal allergies   . Arrhythmia     PALPITATIONS AND PVC - improved with cutting down on caffeine  . GERD (gastroesophageal reflux disease)   . 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 Estanislado Pandy)  . Tremor   . Hepatic steatosis      Relevant past medical, surgical, family and social history reviewed and updated as indicated.  Allergies and medications reviewed and updated. Current Outpatient Prescriptions on File Prior to Visit  Medication Sig  . amLODipine (NORVASC) 5 MG tablet TAKE 1 TABLET (5 MG TOTAL) BY MOUTH DAILY.  . cetirizine (ZYRTEC) 10 MG tablet Take 10 mg by mouth daily after supper.  . fluticasone (FLONASE) 50 MCG/ACT nasal spray Place 2 sprays into both nostrils  daily.  Marland Kitchen HYDROXYZINE PAMOATE PO Take 25 mg by mouth at bedtime as needed (1 or 2 po qhs prn).  . metoprolol succinate (TOPROL-XL) 50 MG 24 hr tablet TAKE 1 TABLET (50 MG TOTAL) BY MOUTH DAILY. TAKE WITH OR IMMEDIATELY FOLLOWING A MEAL.  . naproxen sodium (ANAPROX) 550 MG tablet Take 550 mg by mouth daily as needed (pain).  Marland Kitchen omeprazole (PRILOSEC OTC) 20 MG tablet Take 20 mg by mouth daily.   No current facility-administered medications on file prior to visit.    Review of Systems Per HPI unless specifically indicated above    Objective:    BP 118/80  Pulse 70  Temp(Src) 98.1 F (36.7 C) (Oral)  Wt 224 lb 8 oz (101.833 kg)  SpO2 97%  Physical Exam  Nursing note and vitals reviewed. Constitutional: He appears well-developed and well-nourished. No distress.  HENT:  Head: Normocephalic and atraumatic.  Right Ear: Hearing, tympanic membrane, external ear and ear canal normal.  Left Ear: Hearing, tympanic membrane, external ear and ear canal normal.  Nose: Mucosal edema present. No rhinorrhea. Right sinus exhibits no maxillary sinus tenderness and no frontal sinus tenderness. Left sinus exhibits no maxillary sinus tenderness and no frontal sinus tenderness.  Mouth/Throat: Uvula is midline and mucous membranes are normal. Posterior oropharyngeal edema present. No oropharyngeal exudate, posterior oropharyngeal erythema or tonsillar abscesses.  Eyes: Conjunctivae and EOM are normal. Pupils are  equal, round, and reactive to light. No scleral icterus.  Neck: Normal range of motion. Neck supple.  Cardiovascular: Normal rate, regular rhythm, normal heart sounds and intact distal pulses.   No murmur heard. Pulmonary/Chest: Effort normal and breath sounds normal. No respiratory distress. He has no wheezes. He has no rales.  Lymphadenopathy:    He has no cervical adenopathy.  Skin: Skin is warm and dry. No rash noted.   Results for orders placed in visit on 04/19/14  T4, FREE      Result  Value Ref Range   Free T4 0.89  0.60 - 1.60 ng/dL      Assessment & Plan:   Problem List Items Addressed This Visit   Acute bronchitis - Primary     With pleurisy in setting of recent prior acute broncihtis treated with amoxicillin. Will treat aggressively with zpack as well as hycodan cough syrup. Update if sxs worsen or persist. Pt agrees with plan. Epistaxis - likely due to recent flonase commencement - discussed backing off flonase.    Relevant Medications      HYDROcodone-homatropine (HYCODAN) 5-1.5 MG/5ML syrup    Other Visit Diagnoses   Cough        Relevant Medications       HYDROcodone-homatropine (HYCODAN) 5-1.5 MG/5ML syrup        Follow up plan: Return if symptoms worsen or fail to improve.

## 2014-05-14 NOTE — Progress Notes (Signed)
Pre visit review using our clinic review tool, if applicable. No additional management support is needed unless otherwise documented below in the visit note. 

## 2014-05-14 NOTE — Assessment & Plan Note (Addendum)
With pleurisy in setting of recent prior acute broncihtis treated with amoxicillin. Will treat aggressively with zpack as well as hycodan cough syrup. Update if sxs worsen or persist. Pt agrees with plan. Epistaxis - likely due to recent flonase commencement - discussed backing off flonase.

## 2014-05-14 NOTE — Patient Instructions (Signed)
I think you have recurrent bronchitis - treat with hycodan cough syrup at night time, fill zpack antibiotic, and may use ibuprofen as needed with meals throughout the day. Let us know if not improving as expected or any worsening, watch for fevers, or worsening trouble with swallowing .

## 2014-05-17 ENCOUNTER — Ambulatory Visit (INDEPENDENT_AMBULATORY_CARE_PROVIDER_SITE_OTHER): Payer: BC Managed Care – PPO | Admitting: Internal Medicine

## 2014-05-17 ENCOUNTER — Encounter: Payer: Self-pay | Admitting: Internal Medicine

## 2014-05-17 ENCOUNTER — Telehealth: Payer: Self-pay | Admitting: Family Medicine

## 2014-05-17 VITALS — BP 126/84 | HR 94 | Temp 97.9°F | Wt 227.8 lb

## 2014-05-17 DIAGNOSIS — J209 Acute bronchitis, unspecified: Secondary | ICD-10-CM

## 2014-05-17 MED ORDER — PREDNISONE 10 MG PO TABS: ORAL_TABLET | ORAL | Status: DC

## 2014-05-17 MED ORDER — HYDROCOD POLST-CHLORPHEN POLST 10-8 MG/5ML PO LQCR: 5.0000 mL | Freq: Two times a day (BID) | ORAL | Status: DC | PRN

## 2014-05-17 NOTE — Progress Notes (Signed)
Pre visit review using our clinic review tool, if applicable. No additional management support is needed unless otherwise documented below in the visit note. 

## 2014-05-17 NOTE — Progress Notes (Signed)
Subjective:    Patient ID: Jordan Buchanan, male    DOB: Dec 22, 1970, 43 y.o.   MRN: [id number]  HPI  Pt presents to the clinic today with continue cough and chest congestion. This has been going on since early May. He went to [facility] and was given Augmentin. Halfway through that course of ABX he presented to the clinic with no improvement. Chest xray was normal. Hycodan was given. He did start to feel better but then shortly his symptoms returned. He saw Dr. Lynnae Sandhoff 5/26 with reoccurring symptoms. He was treated with azithromycin and Hycodan. Since that time, he reports the zpack did not help. He continues to have coughing spells and pleuritic chest pain. He denies fever, chills or body aches.  Review of Systems      Past Medical History  Diagnosis Date  . Hypertension     h/o LVH, resolved  . Migraines   . Seasonal allergies   . Arrhythmia     PALPITATIONS AND PVC - improved with cutting down on caffeine  . GERD (gastroesophageal reflux disease)   . 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 Estanislado Pandy)  . Tremor   . Hepatic steatosis     Current Outpatient Prescriptions  Medication Sig Dispense Refill  . amLODipine (NORVASC) 5 MG tablet TAKE 1 TABLET (5 MG TOTAL) BY MOUTH DAILY.  30 tablet  11  . cetirizine (ZYRTEC) 10 MG tablet Take 10 mg by mouth daily after supper.      . fluticasone (FLONASE) 50 MCG/ACT nasal spray Place 2 sprays into both nostrils daily.  16 g  6  . HYDROcodone-homatropine (HYCODAN) 5-1.5 MG/5ML syrup Take 5 mLs by mouth every 8 (eight) hours as needed for cough.  140 mL  0  . HYDROXYZINE PAMOATE PO Take 25 mg by mouth at bedtime as needed (1 or 2 po qhs prn).      . metoprolol succinate (TOPROL-XL) 50 MG 24 hr tablet TAKE 1 TABLET (50 MG TOTAL) BY MOUTH DAILY. TAKE WITH OR IMMEDIATELY FOLLOWING A MEAL.  30 tablet  3  . naproxen sodium (ANAPROX) 550 MG tablet Take 550 mg by mouth daily as needed (pain).      Marland Kitchen omeprazole (PRILOSEC  OTC) 20 MG tablet Take 20 mg by mouth daily.       No current facility-administered medications for this visit.    No Known Allergies  Family History  Problem Relation Age of Onset  . Asthma Father   . Hypertension Brother     obese  . CAD Paternal Grandfather     MI x2  . Multiple sclerosis Brother   . Arthritis Father     father, brother, Pgrandfather  . Stroke Neg Hx   . Cancer Neg Hx     History   Social History  . Marital Status: Married    Spouse Name: N/A    Number of Children: 2  . Years of Education: N/A   Occupational History  . golf course maintenance    Social History Main Topics  . Smoking status: Former Smoker    Quit date: 08/09/2001  . Smokeless tobacco: Current User    Types: Snuff     Comment: at work  . Alcohol Use: No     Comment: quit 2002  . Drug Use: Yes    Special: Marijuana     Comment: quit 2002  . Sexual Activity: Not on file   Other Topics Concern  .  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  HEENT: Denies eye pain, eye redness, ear pain, ringing in the ears, wax buildup, runny nose, nasal congestion, bloody nose, or sore throat. Respiratory: Pt reports cough and chest congestion. Denies difficulty breathing, shortness of breath, or sputum production.  Cardiovascular: Denies chest pain, chest tightness, palpitations or swelling in the hands or feet.    No other specific complaints in a complete review of systems (except as listed in HPI above).  Objective:   Physical Exam   BP 126/84  Pulse 94  Temp(Src) 97.9 F (36.6 C) (Oral)  Wt 227 lb 12 oz (103.307 kg)  SpO2 97% Wt Readings from Last 3 Encounters:  05/17/14 227 lb 12 oz (103.307 kg)  05/14/14 224  lb 8 oz (101.833 kg)  04/25/14 226 lb (102.513 kg)    General: Appears his stated age, well developed, well nourished in NAD. Cardiovascular: Normal rate and rhythm. S1,S2 noted.  No murmur, rubs or gallops noted. No JVD or BLE edema. No carotid bruits noted. Pulmonary/Chest: Normal effort and positive vesicular breath sounds. No respiratory distress. No wheezes, rales or ronchi noted.   BMET    Component Value Date/Time   NA 138 09/13/2013 2140   K 3.3* 09/13/2013 2140   CL 99 09/13/2013 2140   CO2 26 09/13/2013 2140   GLUCOSE 94 09/13/2013 2140   BUN 9 09/13/2013 2140   CREATININE 0.82 09/13/2013 2140   CALCIUM 9.1 09/13/2013 2140   GFRNONAA >90 09/13/2013 2140   GFRAA >90 09/13/2013 2140    Lipid Panel  No results found for this basename: chol, trig, hdl, cholhdl, vldl, ldlcalc    CBC    Component Value Date/Time   WBC 8.6 09/13/2013 2140   RBC 5.10 09/13/2013 2140   HGB 16.0 09/13/2013 2140   HCT 43.5 09/13/2013 2140   PLT 256 09/13/2013 2140   MCV 85.3 09/13/2013 2140   MCH 31.4 09/13/2013 2140   MCHC 36.8* 09/13/2013 2140   RDW 12.7 09/13/2013 2140   LYMPHSABS 3.5 09/13/2013 2140   MONOABS 0.7 09/13/2013 2140   EOSABS 0.2 09/13/2013 2140   BASOSABS 0.0 09/13/2013 2140    Hgb A1C No results found for this basename: HGBA1C        Assessment & Plan:   Acute bronchitis, unresolved:  Discussed with Dr. Darnell Level Will try pred taper and tussionex Rest and fluids encouraged If no improvement, follow up with PCP early next week

## 2014-05-17 NOTE — Patient Instructions (Addendum)

## 2014-05-17 NOTE — Telephone Encounter (Signed)
Patient Information:  Caller Name: [place]  Phone: [phone]  Patient: , Jordan Buchanan  Gender: Male  DOB: 26-Feb-1971  Age: 43 Years  PCP: Ria Bush [facility])  Office Follow Up:  Does the office need to follow up with this patient?: No  Instructions For The Office: N/A   Symptoms  Reason For Call & Symptoms: Seen in the office 3 weeks ago and dx with Bronchitis and took antibiotic 10 days and sx improved but then got worse on 05/10/14. He is still having bad spells, waking up at night coughing and coughing to the point of gagging and dry heaves, He  is fatigued and chest hurts with coughing.  Reviewed Health History In EMR: Yes  Reviewed Medications In EMR: Yes  Reviewed Allergies In EMR: Yes  Reviewed Surgeries / Procedures: Yes  Date of Onset of Symptoms: 05/10/2014  Treatments Tried: Amoxicillin, Zpack, muscinex  Treatments Tried Worked: No  Guideline(s) Used:  Cough  Disposition Per Guideline:   Go to Office Now  Reason For Disposition Reached:   Wheezing is present  Advice Given:  Expected Course:   The expected course depends on what is causing the cough.  Viral bronchitis (chest cold) causes a cough that lasts 1 to 3 weeks. Sometimes you may cough up lots of phlegm (sputum, mucus). The mucus can normally be white, gray, yellow, or green.  Call Back If:  Difficulty breathing  Cough lasts more than 3 weeks  Fever lasts > 3 days  You become worse.  Patient Will Follow Care Advice:  YES  Appointment Scheduled:Patient working in [place] and cannot make earlier appnt time.  05/17/2014 16:45:00 Appointment Scheduled Provider:  Webb Silversmith

## 2014-05-21 ENCOUNTER — Ambulatory Visit (INDEPENDENT_AMBULATORY_CARE_PROVIDER_SITE_OTHER): Payer: BC Managed Care – PPO | Admitting: Family Medicine

## 2014-05-21 ENCOUNTER — Encounter: Payer: Self-pay | Admitting: *Deleted

## 2014-05-21 ENCOUNTER — Telehealth: Payer: Self-pay

## 2014-05-21 ENCOUNTER — Encounter: Payer: Self-pay | Admitting: Family Medicine

## 2014-05-21 VITALS — BP 124/82 | HR 72 | Temp 98.0°F | Wt 225.2 lb

## 2014-05-21 DIAGNOSIS — J209 Acute bronchitis, unspecified: Secondary | ICD-10-CM

## 2014-05-21 MED ORDER — HYDROCOD POLST-CHLORPHEN POLST 10-8 MG/5ML PO LQCR
5.0000 mL | Freq: Two times a day (BID) | ORAL | Status: DC | PRN
Start: 2014-05-21 — End: 2014-06-10

## 2014-05-21 MED ORDER — PREDNISONE 20 MG PO TABS
ORAL_TABLET | ORAL | Status: DC
Start: 2014-05-21 — End: 2014-06-10

## 2014-05-21 NOTE — Telephone Encounter (Signed)
Seen today. 

## 2014-05-21 NOTE — Assessment & Plan Note (Signed)
Anticipate persistent bronchitis - discussed this. Refill stronger prednisone course - tolerated lower dose well. Refilled tussionex 1100mL to take bid for next 5 days. Update if sxs persist or worsen

## 2014-05-21 NOTE — Patient Instructions (Addendum)
You have persistent bronchitis - refilled prednisone and tussionex. Take tussionex twice daily for next 2 days. Out of work note provided today

## 2014-05-21 NOTE — Progress Notes (Signed)
Pre visit review using our clinic review tool, if applicable. No additional management support is needed unless otherwise documented below in the visit note. 

## 2014-05-21 NOTE — Telephone Encounter (Signed)
Pt was seen 05/17/14 with no improvement in cough, congestion and eyes are red and has drainage. Pt scheduled f/u appt with Dr Darnell Level this AM at 9:15 am per 05/17/14 note.

## 2014-05-21 NOTE — Progress Notes (Signed)
BP 124/82  Pulse 72  Temp(Src) 98 F (36.7 C) (Oral)  Wt 225 lb 4 oz (102.173 kg)  SpO2 97%   CC: persistent cough  Subjective:    Patient ID: Jordan Buchanan, male    DOB: [birth year], 43 y.o.   MRN: [id number]  HPI: Charels Stambaugh is a 43 y.o. male presenting on 05/21/2014 for Follow-up   See prior note for details. Recent acute bronchitis dx treated with zpack.  Then seen by [facility] and added prednisone course as well as treated with tussionex at night - helping some.  Today is last dose of prednisone.  Unresolving cough - slightly better after prednisone. Has taken last 2 days off work 2/2 cough. This morning had some clear drainage from L>R eye.  No fevers/chills.  Cough dry. Staying worried because continues having chest pain with deep breaths - but this is only present at night after he's coughed all day.  Some PNDrainage.  Past Medical History  Diagnosis Date  . Hypertension     h/o LVH, resolved  . Migraines   . Seasonal allergies   . Arrhythmia     PALPITATIONS AND PVC - improved with cutting down on caffeine  . GERD (gastroesophageal reflux disease)   . 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 Estanislado Pandy)  . Tremor   . Hepatic steatosis      Relevant past medical, surgical, family and social history reviewed and updated as indicated.  Allergies and medications reviewed and updated. Current Outpatient Prescriptions on File Prior to Visit  Medication Sig  . amLODipine (NORVASC) 5 MG tablet TAKE 1 TABLET (5 MG TOTAL) BY MOUTH DAILY.  . cetirizine (ZYRTEC) 10 MG tablet Take 10 mg by mouth daily after supper.  . fluticasone (FLONASE) 50 MCG/ACT nasal spray Place 2 sprays into both nostrils daily.  Marland Kitchen HYDROXYZINE PAMOATE PO Take 25 mg by mouth at bedtime as needed (1 or 2 po qhs prn).  . metoprolol succinate (TOPROL-XL) 50 MG 24 hr tablet TAKE 1 TABLET (50 MG TOTAL) BY MOUTH DAILY. TAKE WITH OR IMMEDIATELY FOLLOWING A MEAL.  . naproxen  sodium (ANAPROX) 550 MG tablet Take 550 mg by mouth daily as needed (pain).  Marland Kitchen omeprazole (PRILOSEC OTC) 20 MG tablet Take 20 mg by mouth daily.   No current facility-administered medications on file prior to visit.    Review of Systems Per HPI unless specifically indicated above    Objective:    BP 124/82  Pulse 72  Temp(Src) 98 F (36.7 C) (Oral)  Wt 225 lb 4 oz (102.173 kg)  SpO2 97%  Physical Exam  Nursing note and vitals reviewed. Constitutional: He appears well-developed and well-nourished. No distress.  HENT:  Head: Normocephalic and atraumatic.  Right Ear: Hearing, tympanic membrane, external ear and ear canal normal.  Left Ear: Hearing, tympanic membrane, external ear and ear canal normal.  Nose: Nose normal. No mucosal edema or rhinorrhea. Right sinus exhibits no maxillary sinus tenderness and no frontal sinus tenderness. Left sinus exhibits no maxillary sinus tenderness and no frontal sinus tenderness.  Mouth/Throat: Uvula is midline, oropharynx is clear and moist and mucous membranes are normal. No oropharyngeal exudate, posterior oropharyngeal edema, posterior oropharyngeal erythema or tonsillar abscesses.  Eyes: Conjunctivae and EOM are normal. Pupils are equal, round, and reactive to light. No scleral icterus.  Neck: Normal range of motion. Neck supple.  Cardiovascular: Normal rate, regular rhythm, normal heart sounds and intact distal pulses.  No murmur heard. Pulmonary/Chest: Effort normal and breath sounds normal. No respiratory distress. He has no wheezes. He has no rales.  Harsh cough  Lymphadenopathy:    He has no cervical adenopathy.  Skin: Skin is warm and dry. No rash noted.       Assessment & Plan:   Problem List Items Addressed This Visit   Acute bronchitis - Primary     Anticipate persistent bronchitis - discussed this. Refill stronger prednisone course - tolerated lower dose well. Refilled tussionex 162m to take bid for next 5 days. Update if  sxs persist or worsen    Relevant Medications      chlorpheniramine-HYDROcodone (TUSSIONEX) 10-8 MG/5ML LQCR       Follow up plan: Return if symptoms worsen or fail to improve, for annual exam, prior fasting for blood work.

## 2014-05-24 ENCOUNTER — Emergency Department (HOSPITAL_BASED_OUTPATIENT_CLINIC_OR_DEPARTMENT_OTHER)
Admission: EM | Admit: 2014-05-24 | Discharge: 2014-05-24 | Disposition: A | Discharge status: Home / Self Care | Payer: BC Managed Care – PPO | Attending: Emergency Medicine | Admitting: Emergency Medicine

## 2014-05-24 ENCOUNTER — Encounter (HOSPITAL_BASED_OUTPATIENT_CLINIC_OR_DEPARTMENT_OTHER): Payer: Self-pay | Admitting: Emergency Medicine

## 2014-05-24 ENCOUNTER — Emergency Department (HOSPITAL_BASED_OUTPATIENT_CLINIC_OR_DEPARTMENT_OTHER): Payer: BC Managed Care – PPO

## 2014-05-24 DIAGNOSIS — Z79899 Other long term (current) drug therapy: Secondary | ICD-10-CM | POA: Insufficient documentation

## 2014-05-24 DIAGNOSIS — R059 Cough, unspecified: Secondary | ICD-10-CM

## 2014-05-24 DIAGNOSIS — I10 Essential (primary) hypertension: Secondary | ICD-10-CM | POA: Insufficient documentation

## 2014-05-24 DIAGNOSIS — J3489 Other specified disorders of nose and nasal sinuses: Secondary | ICD-10-CM | POA: Insufficient documentation

## 2014-05-24 DIAGNOSIS — Z791 Long term (current) use of non-steroidal anti-inflammatories (NSAID): Secondary | ICD-10-CM | POA: Insufficient documentation

## 2014-05-24 DIAGNOSIS — R05 Cough: Secondary | ICD-10-CM

## 2014-05-24 DIAGNOSIS — G43909 Migraine, unspecified, not intractable, without status migrainosus: Secondary | ICD-10-CM | POA: Insufficient documentation

## 2014-05-24 DIAGNOSIS — Z87891 Personal history of nicotine dependence: Secondary | ICD-10-CM | POA: Insufficient documentation

## 2014-05-24 DIAGNOSIS — K219 Gastro-esophageal reflux disease without esophagitis: Secondary | ICD-10-CM | POA: Insufficient documentation

## 2014-05-24 DIAGNOSIS — Z8709 Personal history of other diseases of the respiratory system: Secondary | ICD-10-CM | POA: Insufficient documentation

## 2014-05-24 DIAGNOSIS — Z8739 Personal history of other diseases of the musculoskeletal system and connective tissue: Secondary | ICD-10-CM | POA: Insufficient documentation

## 2014-05-24 DIAGNOSIS — IMO0002 Reserved for concepts with insufficient information to code with codable children: Secondary | ICD-10-CM | POA: Insufficient documentation

## 2014-05-24 MED ORDER — AEROCHAMBER PLUS FLO-VU MEDIUM MISC
1.0000 | Freq: Once | Status: DC
  Filled 2014-05-24: qty 1

## 2014-05-24 MED ORDER — ALBUTEROL SULFATE HFA 108 (90 BASE) MCG/ACT IN AERS
2.0000 | INHALATION_SPRAY | RESPIRATORY_TRACT | Status: DC
Start: 2014-05-24 — End: 2014-05-24
  Administered 2014-05-24: 2 via RESPIRATORY_TRACT
  Filled 2014-05-24: qty 6.7

## 2014-05-24 MED ORDER — ALBUTEROL SULFATE (2.5 MG/3ML) 0.083% IN NEBU
5.0000 mg | INHALATION_SOLUTION | Freq: Once | RESPIRATORY_TRACT | Status: AC
  Administered 2014-05-24: 5 mg via RESPIRATORY_TRACT
  Filled 2014-05-24: qty 6

## 2014-05-24 NOTE — ED Provider Notes (Signed)
CSN: [id number]     Arrival date & time 05/24/14  1034 History   First MD Initiated Contact with Patient 05/24/14 1045     Chief Complaint  Patient presents with  . Cough     (Consider location/radiation/quality/duration/timing/severity/associated sxs/prior Treatment) Patient is a 43 y.o. male presenting with cough. The history is provided by the patient.  Cough Cough characteristics:  Non-productive and dry Severity:  Moderate Onset quality:  Unable to specify Timing:  Intermittent Progression:  Unchanged Chronicity:  New Smoker: no   Context: exposure to allergens and upper respiratory infection   Context: not occupational exposure, not sick contacts and not smoke exposure   Relieved by:  Nothing Worsened by:  Activity and environmental changes Ineffective treatments:  Cough suppressants (oral steroids) Associated symptoms: rhinorrhea   Associated symptoms: no chest pain, no chills, no diaphoresis, no ear fullness, no ear pain, no fever, no headaches, no myalgias, no rash, no shortness of breath, no sore throat, no weight loss and no wheezing     Past Medical History  Diagnosis Date  . Hypertension     h/o LVH, resolved  . Migraines   . Seasonal allergies   . Arrhythmia     PALPITATIONS AND PVC - improved with cutting down on caffeine  . GERD (gastroesophageal reflux disease)   . 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 Estanislado Pandy)  . Tremor   . Hepatic steatosis    Past Surgical History  Procedure Laterality Date  . Rotator cuff repair  2009    left  . Other surgical history  1992    facial surgery  . Umbilical hernia repair  05/10/2012    Procedure: HERNIA REPAIR UMBILICAL ADULT;  Surgeon: Harl Bowie, MD;  Location: [facility];  Service: General;  Laterality: N/A;   Family History  Problem Relation Age of Onset  . Asthma Father   . Hypertension Brother     obese  . CAD Paternal Grandfather     MI x2  .  Multiple sclerosis Brother   . Arthritis Father     father, brother, Pgrandfather  . Stroke Neg Hx   . Cancer Neg Hx    History  Substance Use Topics  . Smoking status: Former Smoker    Quit date: 08/09/2001  . Smokeless tobacco: Current User    Types: Snuff     Comment: at work  . Alcohol Use: No     Comment: quit 2002    Review of Systems  Constitutional: Negative for fever, chills, weight loss and diaphoresis.  HENT: Positive for rhinorrhea. Negative for ear pain and sore throat.   Respiratory: Positive for cough. Negative for shortness of breath and wheezing.   Cardiovascular: Negative for chest pain.  Musculoskeletal: Negative for myalgias.  Skin: Negative for rash.  Neurological: Negative for headaches.  All other systems reviewed and are negative.     Allergies  Review of patient's allergies indicates no known allergies.  Home Medications   Prior to Admission medications   Medication Sig Start Date End Date Taking? Authorizing Provider  amLODipine (NORVASC) 5 MG tablet TAKE 1 TABLET (5 MG TOTAL) BY MOUTH DAILY. 01/22/14   Ria Bush, MD  cetirizine (ZYRTEC) 10 MG tablet Take 10 mg by mouth daily after supper.    Historical Provider, MD  chlorpheniramine-HYDROcodone (TUSSIONEX) 10-8 MG/5ML LQCR Take 5 mLs by mouth every 12 (twelve) hours as needed for cough. 05/21/14   Garlon Hatchet  Danise Mina, MD  fluticasone [facility]) 50 MCG/ACT nasal spray Place 2 sprays into both nostrils daily. 04/25/14   Webb Silversmith, NP  HYDROXYZINE PAMOATE PO Take 25 mg by mouth at bedtime as needed (1 or 2 po qhs prn).    Historical Provider, MD  metoprolol succinate (TOPROL-XL) 50 MG 24 hr tablet TAKE 1 TABLET (50 MG TOTAL) BY MOUTH DAILY. TAKE WITH OR IMMEDIATELY FOLLOWING A MEAL. 01/15/14   Ria Bush, MD  naproxen sodium (ANAPROX) 550 MG tablet Take 550 mg by mouth daily as needed (pain).    Historical Provider, MD  omeprazole (PRILOSEC OTC) 20 MG tablet Take 20 mg by mouth daily.     Historical Provider, MD  predniSONE (DELTASONE) 20 MG tablet Take two tablets daily for 4 days followed by one tablet daily for 4 days 05/21/14   Ria Bush, MD   Pulse 85  Temp(Src) 97.8 F (36.6 C)  Resp 16  Ht 5' 9"  (1.753 m)  Wt 225 lb (102.059 kg)  BMI 33.21 kg/m2  SpO2 97% Physical Exam  Nursing note and vitals reviewed. Constitutional: He is oriented to person, place, and time. He appears well-developed and well-nourished.  HENT:  Head: Normocephalic and atraumatic.  Right Ear: External ear normal.  Left Ear: External ear normal.  Nose: Nose normal.  Mouth/Throat: Oropharynx is clear and moist.  Eyes: Conjunctivae and EOM are normal. Pupils are equal, round, and reactive to light.  Neck: Normal range of motion. Neck supple.  Cardiovascular: Normal rate, regular rhythm, normal heart sounds and intact distal pulses.   Pulmonary/Chest: Effort normal and breath sounds normal. No respiratory distress. He has no wheezes. He exhibits no tenderness.  Abdominal: Soft. Bowel sounds are normal. He exhibits no distension and no mass. There is no tenderness. There is no guarding.  Musculoskeletal: Normal range of motion.  Neurological: He is alert and oriented to person, place, and time. He has normal reflexes. He exhibits normal muscle tone. Coordination normal.  Skin: Skin is warm and dry.  Psychiatric: He has a normal mood and affect. His behavior is normal. Judgment and thought content normal.    ED Course  Procedures (including critical care time) Labs Review Labs Reviewed - No data to display  Imaging Review No results found.   EKG Interpretation None      MDM   Final diagnoses:  Cough    43 y.o. Male with uri symptoms f/b cough for past 6 weeks.  Treated by pmd x 3 with abx x 2 and now on steroids.  No evidence of acute infection.  Plan to add inhaler and will refer to pulmonary if symptoms continue.    Shaune Pollack, MD 05/24/14 1130

## 2014-05-24 NOTE — Discharge Instructions (Signed)

## 2014-05-24 NOTE — ED Notes (Signed)
Pt being treated for bronchitis since May.  Pt continues to cough.  Dry cough noted at present.  Pt c/o sore throat and non productive cough.

## 2014-05-24 NOTE — ED Notes (Signed)
Pt discharged home with albuterol inhaler and aero chamber.  Pt denies any questions.

## 2014-05-27 ENCOUNTER — Encounter (INDEPENDENT_AMBULATORY_CARE_PROVIDER_SITE_OTHER): Payer: Self-pay

## 2014-05-27 ENCOUNTER — Encounter: Payer: Self-pay | Admitting: Internal Medicine

## 2014-05-27 ENCOUNTER — Ambulatory Visit (INDEPENDENT_AMBULATORY_CARE_PROVIDER_SITE_OTHER): Payer: BC Managed Care – PPO | Admitting: Internal Medicine

## 2014-05-27 VITALS — BP 136/84 | HR 84 | Temp 98.2°F | Ht 70.0 in | Wt 229.8 lb

## 2014-05-27 DIAGNOSIS — J45991 Cough variant asthma: Secondary | ICD-10-CM

## 2014-05-27 MED ORDER — MOMETASONE FURO-FORMOTEROL FUM 100-5 MCG/ACT IN AERO: INHALATION_SPRAY | RESPIRATORY_TRACT | Status: DC

## 2014-05-27 NOTE — Patient Instructions (Signed)
dulera 100 Take 2 puffs first thing in am and then another 2 puffs about 12 hours later.   Work on inhaler technique:  relax and gently blow all the way out then take a nice smooth deep breath back in, triggering the inhaler at same time you start breathing in.  Hold for up to 5 seconds if you can.  Rinse and gargle with water when done  Only use your albuterol as a rescue medication to be used if you can't catch your breath by resting or doing a relaxed purse lip breathing pattern.  - The less you use it, the better it will work when you need it. - Ok to use up to 2 puffs  every 4 hours if you must but call for immediate appointment if use goes up over your usual need - Don't leave home without it !!  (think of it like the spare tire for your car)   Change Prilosec :  Take 30- 60 min before your first and last meals of the day   For cough > delsym 2 tsp every 12 hours as needed   GERD (REFLUX)  is an extremely common cause of respiratory symptoms, many times with no significant heartburn at all.    It can be treated with medication, but also with lifestyle changes including avoidance of late meals, excessive alcohol, smoking cessation, and avoid fatty foods, chocolate, peppermint, colas, red wine, and acidic juices such as orange juice.  NO MINT OR MENTHOL PRODUCTS SO NO COUGH DROPS  USE SUGARLESS CANDY INSTEAD (jolley ranchers or Stover's)  NO OIL BASED VITAMINS - use powdered substitutes.  Please schedule a follow up office visit in 4 weeks, sooner if needed with pfts

## 2014-05-27 NOTE — Progress Notes (Signed)
Subjective:    Patient ID: Jordan Buchanan, male    DOB: 1971/12/14  MRN: [id number]  HPI  18 yowm quit smoking 2001  with ? pna 4/5th grade but completely back to nl = competitive sports and some sob while smoking but not a big deal, had some tendency to sinus infections in last decade attributed to outdoor greenskeeping but in early May 2015 onset of nasal congestion/ sore throat > eval by Dr Leo Grosser with neg strep test rx with augmentin some better then severe cough then rx zpak / pred x 2 then worse in ER June 5th rx with albuterol by Dr Jeanell Sparrow and referred to pulmonary clinic   05/27/2014 1st [facility] office visit/ [facility]  Re cough x early May 2015  Chief Complaint  Patient presents with  . Pulmonary Consult    Self referral. Pt c/o SOB and cough for the past month. Cough is mainly dry, but produces minimal sputum in the am's- ? color.  He is using ventolin HFA every 4 hrs.    on prilosec daily then eat 4 h later, ventolin helps transiently with cough and breathing   No obvious day to day or daytime variabilty or assoc  cp or subjective wheeze overt sinus or hb symptoms. No unusual exp hx or h/o childhood pna/ asthma or knowledge of premature birth.  Sleeping ok without nocturnal  or early am exacerbation  of respiratory  c/o's or need for noct saba. Also denies any obvious fluctuation of symptoms with weather or environmental changes or other aggravating or alleviating factors except as outlined above   Current Medications, Allergies, Complete Past Medical History, Past Surgical History, Family History, and Social History were reviewed in [employer] record.  ROS  The following are not active complaints unless bolded sore throat, dysphagia, dental problems, itching, sneezing,  nasal congestion or excess/ purulent secretions, ear ache,   fever, chills, sweats, unintended wt loss, pleuritic or exertional cp, hemoptysis,  orthopnea pnd or leg swelling, presyncope,  palpitations, heartburn, abdominal pain, anorexia, nausea, vomiting, diarrhea  or change in bowel or urinary habits, change in stools or urine, dysuria,hematuria,  rash, arthralgias, visual complaints, headache, numbness weakness or ataxia or problems with walking or coordination,  change in mood/affect or memory.         Review of Systems  Constitutional: Negative for fever, chills, activity change, appetite change and unexpected weight change.  HENT: Negative for congestion, dental problem, postnasal drip, rhinorrhea, sneezing, sore throat, trouble swallowing and voice change.   Eyes: Negative for visual disturbance.  Respiratory: Positive for cough, chest tightness and shortness of breath. Negative for choking.   Cardiovascular: Negative for chest pain and leg swelling.  Gastrointestinal: Negative for nausea, vomiting and abdominal pain.  Genitourinary: Negative for difficulty urinating.  Musculoskeletal: Negative for arthralgias.  Skin: Negative for rash.  Psychiatric/Behavioral: Negative for behavioral problems and confusion.       Objective:   Physical Exam  Wt Readings from Last 3 Encounters:  05/27/14 229 lb 12.8 oz (104.237 kg)  05/24/14 225 lb (102.059 kg)  05/21/14 225 lb 4 oz (102.173 kg)      HEENT: nl dentition, turbinates, and orophanx. Nl external ear canals without cough reflex   NECK :  without JVD/Nodes/TM/ nl carotid upstrokes bilaterally   LUNGS: no acc muscle use, clear to A and P bilaterally without cough on insp or exp maneuvers   CV:  RRR  no s3 or murmur or increase in  P2, no edema   ABD:  soft and nontender with nl excursion in the supine position. No bruits or organomegaly, bowel sounds nl  MS:  warm without deformities, calf tenderness, cyanosis or clubbing  SKIN: warm and dry without lesions    NEURO:  alert, approp, no deficits    cxr 05/24/14 Low lung volumes. The heart size and mediastinal contours are within  normal limits. Both lungs  are clear. The visualized skeletal  structures are unremarkable.       Assessment & Plan:

## 2014-05-28 DIAGNOSIS — J45991 Cough variant asthma: Secondary | ICD-10-CM | POA: Insufficient documentation

## 2014-05-28 HISTORY — DX: Cough variant asthma: J45.991

## 2014-05-28 NOTE — Assessment & Plan Note (Addendum)
Response to saba strongly supports AB but clearly not adeqquatly controlled at this point  DDX of  difficult airways management all start with A and  include Adherence, Ace Inhibitors, Acid Reflux, Active Sinus Disease, Alpha 1 Antitripsin deficiency, Anxiety masquerading as Airways dz,  ABPA,  allergy(esp in young), Aspiration (esp in elderly), Adverse effects of DPI,  Active smokers, plus two Bs  = Bronchiectasis and Beta blocker use..and one C= CHF  Adherence is always the initial "prime suspect" and is a multilayered concern that requires a "trust but verify" approach in every patient - starting with knowing how to use medications, especially inhalers, correctly, keeping up with refills and understanding the fundamental difference between maintenance and prns vs those medications only taken for a very short course and then stopped and not refilled.  The proper method of use, as well as anticipated side effects, of a metered-dose inhaler are discussed and demonstrated to the patient. Improved effectiveness after extensive coaching during this visit to a level of approximately  75% so try dulera 100 2bid  ? Acid (or non-acid) GERD > always difficult to exclude as up to 75% of pts in some series report no assoc GI/ Heartburn symptoms> rec max (24h)  acid suppression and diet restrictions/ reviewed and instructions given in writing.  F/u in 4 weeks to r/o element of copd (doubt)

## 2014-06-03 ENCOUNTER — Telehealth: Payer: Self-pay | Admitting: Internal Medicine

## 2014-06-03 MED ORDER — ALBUTEROL SULFATE HFA 108 (90 BASE) MCG/ACT IN AERS: 2.0000 | INHALATION_SPRAY | RESPIRATORY_TRACT | Status: DC | PRN

## 2014-06-03 NOTE — Telephone Encounter (Signed)
Ventolin Rx sent to pharmacy [facility] [place] [id number] Pt aware Nothing further needed.

## 2014-06-03 NOTE — Telephone Encounter (Signed)
Pt calling again a/b prescript that he wanted called [facility] to [facility] [place] for the ventolin inhaler pls notfiy pt when done.Hillery Hunter

## 2014-06-10 ENCOUNTER — Encounter: Payer: Self-pay | Admitting: Adult Health

## 2014-06-10 ENCOUNTER — Ambulatory Visit (INDEPENDENT_AMBULATORY_CARE_PROVIDER_SITE_OTHER): Payer: BC Managed Care – PPO | Admitting: Adult Health

## 2014-06-10 ENCOUNTER — Telehealth: Payer: Self-pay | Admitting: Internal Medicine

## 2014-06-10 VITALS — BP 132/74 | HR 81 | Temp 97.8°F | Ht 70.0 in | Wt 231.6 lb

## 2014-06-10 DIAGNOSIS — J45991 Cough variant asthma: Secondary | ICD-10-CM

## 2014-06-10 MED ORDER — HYDROCOD POLST-CHLORPHEN POLST 10-8 MG/5ML PO LQCR: 5.0000 mL | Freq: Two times a day (BID) | ORAL | Status: DC | PRN

## 2014-06-10 NOTE — Patient Instructions (Signed)
Continue on Dulera 100 Take 2 puffs first thing in am and then another 2 puffs about 12 hours later.   Work on inhaler technique:  relax and gently blow all the way out then take a nice smooth deep breath back in, triggering the inhaler at same time you start breathing in.  Hold for up to 5 seconds if you can.  Rinse and gargle with water when done  Only use your albuterol as a rescue medication to be used if you can't catch your breath by resting or doing a relaxed purse lip breathing pattern.  - The less you use it, the better it will work when you need it. - Ok to use up to 2 puffs  every 4 hours if you must but call for immediate appointment if use goes up over your usual need - Don't leave home without it !!  (think of it like the spare tire for your car)   Change Prilosec :  Take 30- 60 min before your first and last meals of the day   For cough > delsym 2 tsp every 12 hours  May use Tussionex 1 tsp At bedtime  As needed  Cough may make you sleepy.    GERD (REFLUX)  is an extremely common cause of respiratory symptoms, many times with no significant heartburn at all.    It can be treated with medication, but also with lifestyle changes including avoidance of late meals, excessive alcohol, smoking cessation, and avoid fatty foods, chocolate, peppermint, colas, red wine, and acidic juices such as orange juice.  NO MINT OR MENTHOL PRODUCTS SO NO COUGH DROPS  USE SUGARLESS CANDY INSTEAD (jolley ranchers or Stover's)  NO OIL BASED VITAMINS - use powdered substitutes.  Please schedule a follow up office visit in 2 weeks, sooner if needed with pfts

## 2014-06-10 NOTE — Telephone Encounter (Signed)
Spoke with the pt  He c/o itchy, red rash "all over my body" He relates this to new meds MW put him on  Has not started any new meds since last ov  OV with TP today at 10:45

## 2014-06-18 NOTE — Progress Notes (Signed)
Subjective:    Patient ID: Jordan Buchanan, male    DOB: [birth year]  MRN: [id number]  HPI 30 yowm quit smoking 2001  with ? pna 4/5th grade but completely back to nl = competitive sports and some sob while smoking but not a big deal, had some tendency to sinus infections in last decade attributed to outdoor greenskeeping but in early May 2015 onset of nasal congestion/ sore throat > eval by Dr Leo Grosser with neg strep test rx with augmentin some better then severe cough then rx zpak / pred x 2 then worse in ER June 5th rx with albuterol by Dr Jeanell Sparrow and referred to pulmonary clinic   05/27/2014 1st [facility] office visit/ [facility]  Re cough x early May 2015  Chief Complaint  Patient presents with  . Pulmonary Consult    Self referral. Pt c/o SOB and cough for the past month. Cough is mainly dry, but produces minimal sputum in the am's- ? color.  He is using ventolin HFA every 4 hrs.    on prilosec daily then eat 4 h later, ventolin helps transiently with cough and breathing  >dulera , PPI and delsym , steroid taper.   06/10/14 Acute OV  MW pt here for rash x1 week. Has had a waxing waning rash on torso , legs in past but worried that [facility] could be causing . Has been told it could be heat related. He denies difficulty swallowing, lip/facial edema or diffuse rash.  Mild pruritic at times.   Pt seen 2 weeks ago , dx w/ cough variant asthma , started on Dulera .Given a steroid taper. He finished prednisone 2 weeks, taking Dulera as prescribed. Does not feel  cough  changed  CXR last ov w/ no acute findings.  He denies any hemoptysis, chest pain, orthopnea, edema or fever.  Cough is mainly dry and keeps him up at night.      Current Medications, Allergies, Complete Past Medical History, Past Surgical History, Family History, and Social History were reviewed in [employer] record.  ROS  The following are not active complaints unless bolded sore throat, dysphagia,  dental problems, itching, sneezing,  nasal congestion or excess/ purulent secretions, ear ache,   fever, chills, sweats, unintended wt loss, pleuritic or exertional cp, hemoptysis,  orthopnea pnd or leg swelling, presyncope, palpitations, heartburn, abdominal pain, anorexia, nausea, vomiting, diarrhea  or change in bowel or urinary habits, change in stools or urine, dysuria,hematuria,  rash, arthralgias, visual complaints, headache, numbness weakness or ataxia or problems with walking or coordination,  change in mood/affect or memory.         Review of Systems  Constitutional: Negative for fever, chills, activity change, appetite change and unexpected weight change.  HENT: Negative for congestion, dental problem, postnasal drip, rhinorrhea, sneezing, sore throat, trouble swallowing and voice change.   Eyes: Negative for visual disturbance.  Respiratory: Positive for cough, and shortness of breath. Negative for choking.   Cardiovascular: Negative for chest pain and leg swelling.  Gastrointestinal: Negative for nausea, vomiting and abdominal pain.  Genitourinary: Negative for difficulty urinating.  Musculoskeletal: Negative for arthralgias.  Skin: Negative for rash.  Psychiatric/Behavioral: Negative for behavioral problems and confusion.       Objective:   Physical Exam                 HEENT: nl dentition, turbinates, and orophanx. Nl external ear canals without cough reflex   NECK :  without JVD/Nodes/TM/ nl carotid  upstrokes bilaterally   LUNGS: no acc muscle use, clear to A and P bilaterally without cough on insp or exp maneuvers   CV:  RRR  no s3 or murmur or increase in P2, no edema   ABD:  soft and nontender with nl excursion in the supine position. No bruits or organomegaly, bowel sounds nl  MS:  warm without deformities, calf tenderness, cyanosis or clubbing  SKIN: warm and dry without lesions    NEURO:  alert, approp, no deficits    cxr 05/24/14 Low lung volumes.  The heart size and mediastinal contours are within  normal limits. Both lungs are clear. The visualized skeletal  structures are unremarkable.       Assessment & Plan:

## 2014-06-18 NOTE — Assessment & Plan Note (Addendum)
Flare with UACS  tx triggers to help with cough control  Needs PFT  Discussed rash -does not appear to be related to [facility] Recommended to discuss with PCP /+/- dermatology   Plan  Continue on Dulera 100 Take 2 puffs first thing in am and then another 2 puffs about 12 hours later.   Work on inhaler technique:  relax and gently blow all the way out then take a nice smooth deep breath back in, triggering the inhaler at same time you start breathing in.  Hold for up to 5 seconds if you can.  Rinse and gargle with water when done  Only use your albuterol as a rescue medication to be used if you can't catch your breath by resting or doing a relaxed purse lip breathing pattern.  - The less you use it, the better it will work when you need it. - Ok to use up to 2 puffs  every 4 hours if you must but call for immediate appointment if use goes up over your usual need - Don't leave home without it !!  (think of it like the spare tire for your car)   Change Prilosec :  Take 30- 60 min before your first and last meals of the day   For cough > delsym 2 tsp every 12 hours  May use Tussionex 1 tsp At bedtime  As needed  Cough may make you sleepy.    GERD (REFLUX)  is an extremely common cause of respiratory symptoms, many times with no significant heartburn at all.    It can be treated with medication, but also with lifestyle changes including avoidance of late meals, excessive alcohol, smoking cessation, and avoid fatty foods, chocolate, peppermint, colas, red wine, and acidic juices such as orange juice.  NO MINT OR MENTHOL PRODUCTS SO NO COUGH DROPS  USE SUGARLESS CANDY INSTEAD (jolley ranchers or Stover's)  NO OIL BASED VITAMINS - use powdered substitutes.  Please schedule a follow up office visit in 2 weeks, sooner if needed with pfts

## 2014-07-02 ENCOUNTER — Other Ambulatory Visit: Payer: Self-pay | Admitting: Internal Medicine

## 2014-07-02 DIAGNOSIS — J45991 Cough variant asthma: Secondary | ICD-10-CM

## 2014-07-03 ENCOUNTER — Encounter: Payer: Self-pay | Admitting: Internal Medicine

## 2014-07-03 ENCOUNTER — Ambulatory Visit (INDEPENDENT_AMBULATORY_CARE_PROVIDER_SITE_OTHER): Payer: BC Managed Care – PPO | Admitting: Internal Medicine

## 2014-07-03 VITALS — BP 108/80 | HR 76 | Temp 97.6°F | Ht 70.0 in | Wt 227.0 lb

## 2014-07-03 DIAGNOSIS — J45991 Cough variant asthma: Secondary | ICD-10-CM

## 2014-07-03 NOTE — Patient Instructions (Addendum)
dulera 100 to up 2 puffs every 12 hours, ok to adjust down and off if doing great.    If you are satisfied with your treatment plan,  let your doctor know and he/she can either refill your medications or you can return here when your prescription runs out.     If in any way you are not 100% satisfied,  please tell us.  If 100% better, tell your friends!  Pulmonary follow up is as needed

## 2014-07-03 NOTE — Progress Notes (Signed)
Subjective:    Patient ID: Jordan Buchanan, male    DOB: 10-07-1971  MRN: [id number]   Brief patient profile:  42 yowm quit smoking 2001  with ? pna 4/5th grade but completely back to nl = competitive sports and some sob while smoking but not a big deal, had some tendency to sinus infections in last decade attributed to outdoor greenskeeping but in early May 2015 onset of nasal congestion/ sore throat > eval by Dr Leo Grosser with neg strep test rx with augmentin some better then severe cough then rx zpak / pred x 2 then worse in ER June 5th rx with albuterol by Dr Jeanell Sparrow and referred to pulmonary clinic 05/27/14.   History of Present Illness  05/27/2014 1st [facility] office visit/ [facility]  Re cough x early May 2015  Chief Complaint  Patient presents with  . Pulmonary Consult    Self referral. Pt c/o SOB and cough for the past month. Cough is mainly dry, but produces minimal sputum in the am's- ? color.  He is using ventolin HFA every 4 hrs.    on prilosec daily then eat 4 h later, ventolin helps transiently with cough and breathing  >dulera , PPI and delsym , steroid taper.   06/10/14 Acute OV  MW pt here for rash x1 week. Has had a waxing waning rash on torso , legs in past but worried that [facility] could be causing . Has been told it could be heat related. He denies difficulty swallowing, lip/facial edema or diffuse rash.  Mild pruritic at times.  Pt seen 2 weeks ago , dx w/ cough variant asthma , started on Dulera .Given a steroid taper. He finished prednisone 2 weeks, taking Dulera as prescribed. Does not feel  cough  changed  CXR last ov w/ no acute findings.  He denies any hemoptysis, chest pain, orthopnea, edema or fever.  Cough is mainly dry and keeps him up at night.  Rec Continue on Dulera 100 Take 2 puffs first thing in am and then another 2 puffs about 12 hours later.  Work on inhaler technique:    Only use your albuterol as a rescue medication   Change Prilosec :  Take 30- 60 min  before your first and last meals of the day  For cough > delsym 2 tsp every 12 hours May use Tussionex 1 tsp At bedtime  As needed  Cough may make you sleepy.      07/03/2014 f/u ov/Wert re: ?cough variant / rx gerd rx plus  dulera 100 2bid, no need for saba  Chief Complaint  Patient presents with  . Follow-up    Pt reports that his cough is much improved. Rash has resolved. No new co's today.    Not limited by breathing from desired activities    Not needing any cough suppression   No obvious day to day or daytime variabilty or assoc chronic cough or cp or chest tightness, subjective wheeze overt sinus or hb symptoms. No unusual exp hx or h/o childhood pna/ asthma or knowledge of premature birth.  Sleeping ok without nocturnal  or early am exacerbation  of respiratory  c/o's or need for noct saba. Also denies any obvious fluctuation of symptoms with weather or environmental changes or other aggravating or alleviating factors except as outlined above   Current Medications, Allergies, Complete Past Medical History, Past Surgical History, Family History, and Social History were reviewed in [employer] record.  ROS  The  following are not active complaints unless bolded sore throat, dysphagia, dental problems, itching, sneezing,  nasal congestion or excess/ purulent secretions, ear ache,   fever, chills, sweats, unintended wt loss, pleuritic or exertional cp, hemoptysis,  orthopnea pnd or leg swelling, presyncope, palpitations, heartburn, abdominal pain, anorexia, nausea, vomiting, diarrhea  or change in bowel or urinary habits, change in stools or urine, dysuria,hematuria,  rash, arthralgias, visual complaints, headache, numbness weakness or ataxia or problems with walking or coordination,  change in mood/affect or memory.                     Objective:   Physical Exam   Wt Readings from Last 3 Encounters:  07/03/14 227 lb (102.967 kg)  06/10/14 231 lb 9.6 oz  (105.053 kg)  05/27/14 229 lb 12.8 oz (104.237 kg)         HEENT: nl dentition, turbinates, and orophanx. Nl external ear canals without cough reflex   NECK :  without JVD/Nodes/TM/ nl carotid upstrokes bilaterally   LUNGS: no acc muscle use, clear to A and P bilaterally without cough on insp or exp maneuvers   CV:  RRR  no s3 or murmur or increase in P2, no edema   ABD:  soft and nontender with nl excursion in the supine position. No bruits or organomegaly, bowel sounds nl  MS:  warm without deformities, calf tenderness, cyanosis or clubbing  SKIN: warm and dry without lesions    NEURO:  alert, approp, no deficits    cxr 05/24/14 Low lung volumes. The heart size and mediastinal contours are within  normal limits. Both lungs are clear. The visualized skeletal  structures are unremarkable.       Assessment & Plan:   Outpatient Encounter Prescriptions as of 07/03/2014  Medication Sig  . albuterol (VENTOLIN HFA) 108 (90 BASE) MCG/ACT inhaler Inhale 2 puffs into the lungs every 4 (four) hours as needed for wheezing or shortness of breath.  Marland Kitchen amLODipine (NORVASC) 5 MG tablet TAKE 1 TABLET (5 MG TOTAL) BY MOUTH DAILY.  . cetirizine (ZYRTEC) 10 MG tablet Take 10 mg by mouth daily after supper.  . fluticasone (FLONASE) 50 MCG/ACT nasal spray Place 2 sprays into both nostrils daily.  . metoprolol succinate (TOPROL-XL) 50 MG 24 hr tablet TAKE 1 TABLET (50 MG TOTAL) BY MOUTH DAILY. TAKE WITH OR IMMEDIATELY FOLLOWING A MEAL.  . mometasone-formoterol (DULERA) 100-5 MCG/ACT AERO Take 2 puffs first thing in am and then another 2 puffs about 12 hours later.  . naproxen sodium (ANAPROX) 550 MG tablet Take 550 mg by mouth daily as needed (pain).  Marland Kitchen omeprazole (PRILOSEC OTC) 20 MG tablet Take 20 mg by mouth 2 (two) times daily before a meal.   . [DISCONTINUED] chlorpheniramine-HYDROcodone (TUSSIONEX) 10-8 MG/5ML LQCR Take 5 mLs by mouth every 12 (twelve) hours as needed for cough.  .  [DISCONTINUED] HYDROXYZINE PAMOATE PO Take 25 mg by mouth at bedtime as needed (1 or 2 po qhs prn).

## 2014-07-03 NOTE — Progress Notes (Signed)
PFT done today. 

## 2014-07-04 NOTE — Assessment & Plan Note (Addendum)
05/27/2014 p extensive coaching HFA effectiveness =    75% > try dulera 100 2bid  - PFT's 07/03/2014 FEV1 4.15 (98%) ratio 89 and no chang p B2 and nl fef25-75  And nl dlco   I had an extended discussion with the patient today lasting 15 to 20 minutes of a 25 minute visit on the following issues: pfts do not suggest any asthma at all albeit while on dulera but likely does have cough variant asthma/ could have UACS as well that finally resolved with elimination of cyclical cough and GERD  Therefore reasonable to try to wean dulera but maintain gerd rx and see if flares.  If does flare would have low threshold to change toprol to bisoprolol since more Beta specific

## 2014-07-07 LAB — PULMONARY FUNCTION TEST
DL/VA % pred: 109 %
DL/VA: 5.11 ml/min/mmHg/L
DLCO UNC % PRED: 99 %
DLCO UNC: 32.3 ml/min/mmHg
FEF 25-75 Post: 6.07 L/sec
FEF 25-75 Pre: 5.77 L/sec
FEF2575-%Change-Post: 5 %
FEF2575-%Pred-Post: 154 %
FEF2575-%Pred-Pre: 146 %
FEV1-%CHANGE-POST: 1 %
FEV1-%PRED-PRE: 98 %
FEV1-%Pred-Post: 99 %
FEV1-POST: 4.19 L
FEV1-Pre: 4.15 L
FEV1FVC-%CHANGE-POST: 0 %
FEV1FVC-%Pred-Pre: 110 %
FEV6-%Change-Post: 0 %
FEV6-%PRED-PRE: 90 %
FEV6-%Pred-Post: 91 %
FEV6-PRE: 4.68 L
FEV6-Post: 4.72 L
FEV6FVC-%PRED-POST: 102 %
FEV6FVC-%Pred-Pre: 102 %
FVC-%Change-Post: 0 %
FVC-%Pred-Post: 89 %
FVC-%Pred-Pre: 88 %
FVC-POST: 4.72 L
FVC-PRE: 4.68 L
POST FEV6/FVC RATIO: 100 %
Post FEV1/FVC ratio: 89 %
Pre FEV1/FVC ratio: 89 %
Pre FEV6/FVC Ratio: 100 %
RV % PRED: 65 %
RV: 1.21 L
TLC % pred: 87 %
TLC: 6.05 L

## 2014-08-13 ENCOUNTER — Other Ambulatory Visit: Payer: Self-pay | Admitting: Family Medicine

## 2014-09-05 ENCOUNTER — Encounter: Payer: Self-pay | Admitting: *Deleted

## 2014-09-05 ENCOUNTER — Ambulatory Visit (INDEPENDENT_AMBULATORY_CARE_PROVIDER_SITE_OTHER): Payer: BC Managed Care – PPO | Admitting: Family Medicine

## 2014-09-05 ENCOUNTER — Encounter: Payer: Self-pay | Admitting: Family Medicine

## 2014-09-05 VITALS — BP 122/72 | HR 70 | Temp 97.5°F | Wt 227.0 lb

## 2014-09-05 DIAGNOSIS — J020 Streptococcal pharyngitis: Secondary | ICD-10-CM

## 2014-09-05 DIAGNOSIS — J029 Acute pharyngitis, unspecified: Secondary | ICD-10-CM

## 2014-09-05 LAB — POCT RAPID STREP A (OFFICE): Rapid Strep A Screen: POSITIVE — AB

## 2014-09-05 MED ORDER — AMOXICILLIN 875 MG PO TABS: 875.0000 mg | ORAL_TABLET | Freq: Two times a day (BID) | ORAL | Status: DC

## 2014-09-05 NOTE — Progress Notes (Signed)
BP 122/72  Pulse 70  Temp(Src) 97.5 F (36.4 C) (Oral)  Wt 227 lb (102.967 kg)  SpO2 98%   CC: ST  Subjective:    Patient ID: Jordan Buchanan, male    DOB: 07-13-1971, 43 y.o.   MRN: [id number]  HPI: Jordan Buchanan is a 43 y.o. male presenting on 09/05/2014 for Sore Throat   Very sore throat started yesterday. +malaise. Trouble sleeping last night, gargling every hour. + PNdrainage and congestion.  No fevers/chills, myalgias, arthralgias, coughing, new rashes.  2 daughters recently tested positive for strep (this week). No smokers at home.  H/o cough variant asthma seen by Dr. Melvyn Novas. Has restarted dulera. Has not needed albuterol for now.  Relevant past medical, surgical, family and social history reviewed and updated as indicated.  Allergies and medications reviewed and updated. Current Outpatient Prescriptions on File Prior to Visit  Medication Sig  . albuterol (VENTOLIN HFA) 108 (90 BASE) MCG/ACT inhaler Inhale 2 puffs into the lungs every 4 (four) hours as needed for wheezing or shortness of breath.  Marland Kitchen amLODipine (NORVASC) 5 MG tablet TAKE 1 TABLET (5 MG TOTAL) BY MOUTH DAILY.  . cetirizine (ZYRTEC) 10 MG tablet Take 10 mg by mouth daily after supper.  . fluticasone (FLONASE) 50 MCG/ACT nasal spray Place 2 sprays into both nostrils daily.  . metoprolol succinate (TOPROL-XL) 50 MG 24 hr tablet TAKE 1 TABLET (50 MG TOTAL) BY MOUTH DAILY. TAKE WITH OR IMMEDIATELY FOLLOWING A MEAL.  . mometasone-formoterol (DULERA) 100-5 MCG/ACT AERO Take 2 puffs first thing in am and then another 2 puffs about 12 hours later.  . naproxen sodium (ANAPROX) 550 MG tablet Take 550 mg by mouth daily as needed (pain).  Marland Kitchen omeprazole (PRILOSEC OTC) 20 MG tablet Take 20 mg by mouth 2 (two) times daily before a meal.    No current facility-administered medications on file prior to visit.    Review of Systems Per HPI unless specifically indicated above    Objective:    BP 122/72  Pulse 70   Temp(Src) 97.5 F (36.4 C) (Oral)  Wt 227 lb (102.967 kg)  SpO2 98%  Physical Exam  Nursing note and vitals reviewed. Constitutional: He appears well-developed and well-nourished. No distress.  HENT:  Head: Normocephalic and atraumatic.  Right Ear: Hearing, tympanic membrane, external ear and ear canal normal.  Left Ear: Hearing, tympanic membrane, external ear and ear canal normal.  Nose: Nose normal. No mucosal edema or rhinorrhea. Right sinus exhibits no maxillary sinus tenderness and no frontal sinus tenderness. Left sinus exhibits no maxillary sinus tenderness and no frontal sinus tenderness.  Mouth/Throat: Uvula is midline and mucous membranes are normal. Posterior oropharyngeal erythema present. No oropharyngeal exudate, posterior oropharyngeal edema or tonsillar abscesses.  Evident nasal congestion  Eyes: Conjunctivae and EOM are normal. Pupils are equal, round, and reactive to light. No scleral icterus.  Neck: Normal range of motion. Neck supple.  Cardiovascular: Normal rate, regular rhythm, normal heart sounds and intact distal pulses.   No murmur heard. Pulmonary/Chest: Effort normal and breath sounds normal. No respiratory distress. He has no wheezes. He has no rales.  Lymphadenopathy:    He has no cervical adenopathy.  Skin: Skin is warm and dry. No rash noted.   Results for orders placed in visit on 09/05/14  POCT RAPID STREP A (OFFICE)      Result Value Ref Range   Rapid Strep A Screen Positive (*) Negative      Assessment & Plan:  Problem List Items Addressed This Visit   Acute pharyngitis - Primary     With faintly positive RST. However sxs more indicative of viral sinusitis. Regardless, treat with amoxicillin course given sick fm contacts. Update if not improving as expected over next 5-7 days.     Other Visit Diagnoses   Sore throat        Relevant Orders       Rapid Strep A (Completed)        Follow up plan: Return if symptoms worsen or fail to  improve.

## 2014-09-05 NOTE — Patient Instructions (Addendum)
Strep test was positive. Treat with amoxicillin 10 day course. Update if not improving with this. May use albuterol for shortness of breath or cough, I agree with restarting dulera for next 1-2 weeks. Push fluids and rest. Honey with lemon, continue salt water gargles. May use aleve with food twice daily for sore throat.

## 2014-09-05 NOTE — Assessment & Plan Note (Signed)
With faintly positive RST. However sxs more indicative of viral sinusitis. Regardless, treat with amoxicillin course given sick fm contacts. Update if not improving as expected over next 5-7 days.

## 2014-09-05 NOTE — Progress Notes (Signed)
Pre visit review using our clinic review tool, if applicable. No additional management support is needed unless otherwise documented below in the visit note. 

## 2014-12-21 ENCOUNTER — Other Ambulatory Visit: Payer: Self-pay | Admitting: Family Medicine

## 2015-01-13 ENCOUNTER — Ambulatory Visit (INDEPENDENT_AMBULATORY_CARE_PROVIDER_SITE_OTHER): Payer: BLUE CROSS/BLUE SHIELD | Admitting: Family Medicine

## 2015-01-13 ENCOUNTER — Encounter: Payer: Self-pay | Admitting: Family Medicine

## 2015-01-13 VITALS — BP 136/84 | HR 78 | Temp 97.6°F | Resp 16 | Ht 70.0 in | Wt 238.8 lb

## 2015-01-13 DIAGNOSIS — M542 Cervicalgia: Secondary | ICD-10-CM | POA: Insufficient documentation

## 2015-01-13 MED ORDER — METHOCARBAMOL 500 MG PO TABS: 500.0000 mg | ORAL_TABLET | Freq: Three times a day (TID) | ORAL | Status: DC | PRN

## 2015-01-13 NOTE — Progress Notes (Signed)
Pre visit review using our clinic review tool, if applicable. No additional management support is needed unless otherwise documented below in the visit note. 

## 2015-01-13 NOTE — Assessment & Plan Note (Signed)
Anticipate related to muscle spasm/tightness - rec treat with heating pad and robaxin muscle relaxant. Update if not improving as expected. Pt agrees with plan.  No meningeal sxs, no fever, no lymphadenopathy. Consider [place] if enlarging swelling.

## 2015-01-13 NOTE — Patient Instructions (Addendum)
I think you have tight neck muscles leading to this swelling and pain. Treat with heating pad and may use muscle relaxant when symptomatic with pain - caution muscle relaxant can cause sleepiness.  Update [place] if fever, or increasing swelling size, or not improving as expected.

## 2015-01-13 NOTE — Progress Notes (Signed)
BP 136/84 mmHg  Pulse 78  Temp(Src) 97.6 F (36.4 C) (Oral)  Resp 16  Ht 5\' 10"  (1.778 m)  Wt 238 lb 12.8 oz (108.319 kg)  BMI 34.26 kg/m2  SpO2 97%   CC: check knot on neck  Subjective:    Patient ID: Jordan Buchanan, male    DOB: 12-13-1971, 44 y.o.   MRN: [id number]  HPI: Jordan Buchanan is a 44 y.o. male presenting on 01/13/2015 for knot on neck   Increased pain R posterior neck over last 2-3 days with knot present for last month. No redness or warmth of knot. No pain down R arm or numbness/weakness. No fevers/chills, sore throat. + nasal congestion. Also noticing L shoulder pain (h/o L RTC surgery).   Recently treated for streptococcal pharyngitis 08/2014.   Cough variant asthma triggered by respiratory infections (saw Dr Melvyn Novas) Ruthe Mannan helped resolve cough. Now off dulera.   Relevant past medical, surgical, family and social history reviewed and updated as indicated. Interim medical history since our last visit reviewed. Allergies and medications reviewed and updated. Current Outpatient Prescriptions on File Prior to Visit  Medication Sig  . albuterol (VENTOLIN HFA) 108 (90 BASE) MCG/ACT inhaler Inhale 2 puffs into the lungs every 4 (four) hours as needed for wheezing or shortness of breath.  Marland Kitchen amLODipine (NORVASC) 5 MG tablet TAKE 1 TABLET (5 MG TOTAL) BY MOUTH DAILY.  Marland Kitchen amoxicillin (AMOXIL) 875 MG tablet Take 1 tablet (875 mg total) by mouth 2 (two) times daily.  . cetirizine (ZYRTEC) 10 MG tablet Take 10 mg by mouth daily after supper.  . fluticasone (FLONASE) 50 MCG/ACT nasal spray Place 2 sprays into both nostrils daily.  . metoprolol succinate (TOPROL-XL) 50 MG 24 hr tablet TAKE 1 TABLET BY MOUTH EVERY DAY WITH OR IMMEDIATLEY FOLLOWING A MEAL  . mometasone-formoterol (DULERA) 100-5 MCG/ACT AERO Take 2 puffs first thing in am and then another 2 puffs about 12 hours later.  . naproxen sodium (ANAPROX) 550 MG tablet Take 550 mg by mouth daily as needed (pain).  Marland Kitchen omeprazole  (PRILOSEC OTC) 20 MG tablet Take 20 mg by mouth 2 (two) times daily before a meal.    No current facility-administered medications on file prior to visit.    Review of Systems Per HPI unless specifically indicated above     Objective:    BP 136/84 mmHg  Pulse 78  Temp(Src) 97.6 F (36.4 C) (Oral)  Resp 16  Ht 5\' 10"  (1.778 m)  Wt 238 lb 12.8 oz (108.319 kg)  BMI 34.26 kg/m2  SpO2 97%  Wt Readings from Last 3 Encounters:  01/13/15 238 lb 12.8 oz (108.319 kg)  09/05/14 227 lb (102.967 kg)  07/03/14 227 lb (102.967 kg)    Physical Exam  Constitutional: He appears well-developed and well-nourished. No distress.  HENT:  Head: Normocephalic and atraumatic.  Nose: No mucosal edema or rhinorrhea.  Mouth/Throat: Uvula is midline, oropharynx is clear and moist and mucous membranes are normal. No oropharyngeal exudate, posterior oropharyngeal edema, posterior oropharyngeal erythema or tonsillar abscesses.  Neck: Normal range of motion. Neck supple. No thyromegaly present.  FROM at neck, supple. Mild swelling/tightness of R paracervical muscles, but no pain to palpation. No LAD appreciated.  Musculoskeletal: He exhibits no edema.  Lymphadenopathy:    He has no cervical adenopathy.  Nursing note and vitals reviewed.      Assessment & Plan:   Problem List Items Addressed This Visit    Neck pain on right side -  Primary    Anticipate related to muscle spasm/tightness - rec treat with heating pad and robaxin muscle relaxant. Update if not improving as expected. Pt agrees with plan.  No meningeal sxs, no fever, no lymphadenopathy. Consider [place] if enlarging swelling.          Follow up plan: Return if symptoms worsen or fail to improve.

## 2015-01-17 ENCOUNTER — Telehealth: Payer: Self-pay

## 2015-01-17 DIAGNOSIS — R221 Localized swelling, mass and lump, neck: Secondary | ICD-10-CM

## 2015-01-17 DIAGNOSIS — M542 Cervicalgia: Secondary | ICD-10-CM

## 2015-01-17 NOTE — Telephone Encounter (Signed)
Pt left vm;pt was seen 01/13/15; lump on neck is not going away and pain in neck has worsened . Pt request [place] discussed at appt.

## 2015-01-18 NOTE — Telephone Encounter (Signed)
I have ordered ultrasound.

## 2015-01-20 ENCOUNTER — Other Ambulatory Visit: Payer: Self-pay | Admitting: Family Medicine

## 2015-01-21 ENCOUNTER — Ambulatory Visit: Payer: Self-pay | Admitting: Family Medicine

## 2015-01-22 ENCOUNTER — Telehealth: Payer: Self-pay

## 2015-01-22 ENCOUNTER — Encounter: Payer: Self-pay | Admitting: Family Medicine

## 2015-01-22 MED ORDER — CYCLOBENZAPRINE HCL 10 MG PO TABS: 5.0000 mg | ORAL_TABLET | Freq: Three times a day (TID) | ORAL | Status: DC | PRN

## 2015-01-22 NOTE — Telephone Encounter (Signed)
See imaging result note - normal [place]. If persistent pain recommend different muscle relaxant - flexeril 5-10mg  tid prn (sedation precautions). Ensure he's taken aleve 2 tablets twice daily with food regularly for at least a week as well. If no better with this, let us know - would suggest return for re eval.  Does this feel like his prior episodes of spondyloarthritis? It could be related to this and he may benefit from return to rheum (occipital enthesitis?).

## 2015-01-22 NOTE — Telephone Encounter (Signed)
Pt left v/m requesting test results from 01/21/15 at [facility]. Pt request cb [id number]. Pt continues to hurt.

## 2015-01-23 NOTE — Telephone Encounter (Signed)
Spoke with patient. He said he has been taking tylenol and muscle relaxer, but not aleve. 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 He is concerned because the mass has increased in size. I went ahead and scheduled him to come back in for a follow up tomorrow because he is not satisfied with the [place] results. He is certain something is going on.

## 2015-01-23 NOTE — Telephone Encounter (Signed)
will see then. 

## 2015-01-24 ENCOUNTER — Encounter: Payer: Self-pay | Admitting: Family Medicine

## 2015-01-24 ENCOUNTER — Ambulatory Visit (INDEPENDENT_AMBULATORY_CARE_PROVIDER_SITE_OTHER): Payer: BLUE CROSS/BLUE SHIELD | Admitting: Family Medicine

## 2015-01-24 VITALS — BP 136/86 | HR 72 | Temp 98.1°F | Wt 235.0 lb

## 2015-01-24 DIAGNOSIS — M542 Cervicalgia: Secondary | ICD-10-CM

## 2015-01-24 NOTE — Patient Instructions (Signed)
I still think this is more muscular - continue naprosyn for the next week twice daily with food, and try flexeril. May keep appointment with Dr Bronson Curb for f/u spondyloarthropathy.

## 2015-01-24 NOTE — Progress Notes (Signed)
Pre visit review using our clinic review tool, if applicable. No additional management support is needed unless otherwise documented below in the visit note. 

## 2015-01-24 NOTE — Assessment & Plan Note (Signed)
I still think this is muscular likely paracervical mm strain - continue naprosyn 500mg  bid x 7 days with food then prn, start flexeril nightly. Did not tolerate heating pad - worsened itching.  Provided with exercises from [facility] pt advisor on neck strain. Pt has scheduled appt with Dr Estanislado Pandy to re establish for h/o spondyloarthropathy prior on humira. However, today's sxs not consistent with occipital enthesitis.

## 2015-01-24 NOTE — Progress Notes (Signed)
BP 136/86 mmHg  Pulse 72  Temp(Src) 98.1 F (36.7 C) (Oral)  Wt 235 lb (106.595 kg)   CC: f/u neck pain/swelling  Subjective:    Patient ID: Jordan Buchanan, male    DOB: 11-20-1971, 44 y.o.   MRN: [id number]  HPI: Jordan Buchanan is a 44 y.o. male presenting on 01/24/2015 for Follow-up   Seen here 01/13/2015 with R posterior neck pain. Thought neck muscle strain, treated with heating pad, continued aleve and robaxin. He had no meningeal symptoms, no fever, no lymphadenopathy. This did not improve so he had an ultrasound done which was normal. He returns today with compliant of increased swelling at area. He hasn't filled flexeril. Just restarted naprosyn yesterday. Swelling has decreased today. sxs ongoing for last 2 weeks.  Has f/u appt with Dr Estanislado Pandy on Monday.   Denies inciting trauma/injury. Denies fevers/chills, new rash. Persistent neck pruritis treated with clobetasol cream.   See imaging section in chart for ultrasound - no evidence for focal mass, fluid collection, or other abnormality at area.  Recent strep throat treated 08/2014 and sxs resolved.  Relevant past medical, surgical, family and social history reviewed and updated as indicated. Interim medical history since our last visit reviewed. Allergies and medications reviewed and updated. Current Outpatient Prescriptions on File Prior to Visit  Medication Sig  . albuterol (VENTOLIN HFA) 108 (90 BASE) MCG/ACT inhaler Inhale 2 puffs into the lungs every 4 (four) hours as needed for wheezing or shortness of breath.  Marland Kitchen amLODipine (NORVASC) 5 MG tablet TAKE 1 TABLET (5 MG TOTAL) BY MOUTH DAILY.  . cetirizine (ZYRTEC) 10 MG tablet Take 10 mg by mouth daily after supper.  . fluticasone (FLONASE) 50 MCG/ACT nasal spray Place 2 sprays into both nostrils daily.  . metoprolol succinate (TOPROL-XL) 50 MG 24 hr tablet TAKE 1 TABLET BY MOUTH EVERY DAY WITH OR IMMEDIATLEY FOLLOWING A MEAL  . mometasone-formoterol (DULERA) 100-5 MCG/ACT  AERO Take 2 puffs first thing in am and then another 2 puffs about 12 hours later.  . naproxen sodium (ANAPROX) 550 MG tablet Take 550 mg by mouth daily as needed (pain).  Marland Kitchen omeprazole (PRILOSEC OTC) 20 MG tablet Take 20 mg by mouth 2 (two) times daily before a meal.   . cyclobenzaprine (FLEXERIL) 10 MG tablet Take 0.5-1 tablets (5-10 mg total) by mouth 3 (three) times daily as needed for muscle spasms. (Patient not taking: Reported on 01/24/2015)   No current facility-administered medications on file prior to visit.    Review of Systems Per HPI unless specifically indicated above     Objective:    BP 136/86 mmHg  Pulse 72  Temp(Src) 98.1 F (36.7 C) (Oral)  Wt 235 lb (106.595 kg)  Wt Readings from Last 3 Encounters:  01/24/15 235 lb (106.595 kg)  01/13/15 238 lb 12.8 oz (108.319 kg)  09/05/14 227 lb (102.967 kg)    Physical Exam  Constitutional: He appears well-developed and well-nourished. No distress.  Musculoskeletal: He exhibits no edema.  FROM at neck, neck supple. Mild swelling left of midline cervical neck but not significantly tender to palpation, no fluctuance/induration or erythema.   No midline spine tenderness Tender to palpation L trapezius belly  Skin: Skin is warm and dry. No rash noted.  Nursing note and vitals reviewed.  Results for orders placed or performed in visit on 09/05/14  Rapid Strep A  Result Value Ref Range   Rapid Strep A Screen Positive (A) Negative      Assessment &  Plan:   Problem List Items Addressed This Visit    Neck pain on right side - Primary    I still think this is muscular likely paracervical mm strain - continue naprosyn 500mg  bid x 7 days with food then prn, start flexeril nightly. Did not tolerate heating pad - worsened itching.  Provided with exercises from [facility] pt advisor on neck strain. Pt has scheduled appt with Dr Estanislado Pandy to re establish for h/o spondyloarthropathy prior on humira. However, today's sxs not consistent with  occipital enthesitis.          Follow up plan: Return if symptoms worsen or fail to improve.

## 2015-05-13 ENCOUNTER — Other Ambulatory Visit: Payer: Self-pay

## 2015-05-13 DIAGNOSIS — J309 Allergic rhinitis, unspecified: Secondary | ICD-10-CM

## 2015-05-13 MED ORDER — FLUTICASONE PROPIONATE 50 MCG/ACT NA SUSP: 2.0000 | Freq: Every day | NASAL | Status: DC

## 2015-06-02 ENCOUNTER — Other Ambulatory Visit: Payer: Self-pay | Admitting: Internal Medicine

## 2015-07-10 ENCOUNTER — Ambulatory Visit (INDEPENDENT_AMBULATORY_CARE_PROVIDER_SITE_OTHER): Payer: PRIVATE HEALTH INSURANCE | Admitting: Internal Medicine

## 2015-07-10 ENCOUNTER — Encounter: Payer: Self-pay | Admitting: Internal Medicine

## 2015-07-10 VITALS — BP 124/78 | HR 71 | Temp 98.1°F | Wt 219.0 lb

## 2015-07-10 DIAGNOSIS — R0982 Postnasal drip: Secondary | ICD-10-CM

## 2015-07-10 DIAGNOSIS — G4719 Other hypersomnia: Secondary | ICD-10-CM | POA: Diagnosis not present

## 2015-07-10 DIAGNOSIS — I10 Essential (primary) hypertension: Secondary | ICD-10-CM

## 2015-07-10 DIAGNOSIS — R1310 Dysphagia, unspecified: Secondary | ICD-10-CM

## 2015-07-10 DIAGNOSIS — R0683 Snoring: Secondary | ICD-10-CM

## 2015-07-10 NOTE — Patient Instructions (Signed)

## 2015-07-10 NOTE — Progress Notes (Signed)
Subjective:    Patient ID: Jordan Buchanan, male    DOB: July 01, 1971, 44 y.o.   MRN: [id number]  HPI  Pt presents to the clinic today for BP check. He went to his rheumatologist to discuss getting a steroid injection in his left shoulder because he has been having left shoulder pain. The rheumatologist would not give him a shot because his BP was elevated at 142/81. He was told to follow up with his PCP about his HTN. He is taking Norvasc and Metoprolol as prescribed. He denies headache, dizziness, chest pain, chest tightness or shortness of breath. Although he is taking his pills, he reports he is having trouble swallowing the pills. For some reason over the last 2-3 weeks, his gag reflex has been really sensitive. He denies sore throat. He does not feel like there is a lump in his throat. He does not choke on his food. He has never had difficulty swallowing in the past. His BP today is 124/78.  He also would like to be referred for a sleep study. He has a family history of sleep apnea in his brother and father. Between midnight - 1 am, he has woke up, made coffee and got ready for work before he realized it was not his normal wake up time. He has done this on a few occassions. His wife reports that he snores. He does feel sleepy during the day. He does not nap during the day. He does have trouble falling asleep because he feels antsy.  Review of Systems      Past Medical History  Diagnosis Date  . Hypertension     h/o LVH, resolved  . Migraines   . Seasonal allergies   . Arrhythmia     PALPITATIONS AND PVC - improved with cutting down on caffeine  . GERD (gastroesophageal reflux disease)   . 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 Estanislado Pandy)  . Tremor   . Hepatic steatosis   . Cough variant asthma 05/28/2014    05/27/2014 p extensive coaching HFA effectiveness =    75% > try dulera 100 2bid  - PFT's 07/03/2014 FEV1 4.15 (98%) ratio 89 and no chang p B2 and nl  fef25-75  And nl dlco      Current Outpatient Prescriptions  Medication Sig Dispense Refill  . albuterol (VENTOLIN HFA) 108 (90 BASE) MCG/ACT inhaler Inhale 2 puffs into the lungs every 4 (four) hours as needed for wheezing or shortness of breath. 1 Inhaler 6  . amLODipine (NORVASC) 5 MG tablet TAKE 1 TABLET (5 MG TOTAL) BY MOUTH DAILY. 30 tablet 6  . cetirizine (ZYRTEC) 10 MG tablet Take 10 mg by mouth daily after supper.    . clobetasol cream (TEMOVATE) 2.67 % Apply 1 application topically 2 (two) times daily.    . cyclobenzaprine (FLEXERIL) 10 MG tablet Take 0.5-1 tablets (5-10 mg total) by mouth 3 (three) times daily as needed for muscle spasms. 40 tablet 0  . DULERA 100-5 MCG/ACT AERO TAKE 2 PUFFS FIRST THING IN THE MORNING AND THEN ANOTHER 2 PUFFS ABOUT 12 HOURS LATER 13 Inhaler 0  . fluticasone (FLONASE) 50 MCG/ACT nasal spray Place 2 sprays into both nostrils daily. 16 g 6  . metoprolol succinate (TOPROL-XL) 50 MG 24 hr tablet TAKE 1 TABLET BY MOUTH EVERY DAY WITH OR IMMEDIATLEY FOLLOWING A MEAL 30 tablet 11  . naproxen sodium (ANAPROX) 550 MG tablet Take 550 mg by mouth daily as  needed (pain).    Marland Kitchen omeprazole (PRILOSEC OTC) 20 MG tablet Take 20 mg by mouth 2 (two) times daily before a meal.      No current facility-administered medications for this visit.    No Known Allergies  Family History  Problem Relation Age of Onset  . Asthma Father   . Hypertension Brother     obese  . CAD Paternal Grandfather     MI x2  . Multiple sclerosis Brother   . Arthritis Father     father, brother, Pgrandfather  . Stroke Neg Hx   . Cancer Neg Hx   . Chronic bronchitis Father     History   Social History  . Marital Status: Married    Spouse Name: N/A  . Number of Children: 2  . Years of Education: N/A   Occupational History  . golf course maintenance    Social History Main Topics  . Smoking status: Former Smoker -- 1.00 packs/day for 13 years    Types: Cigarettes    Quit  date: 12/21/1999  . Smokeless tobacco: Former Systems developer    Types: Snuff    Quit date: 12/20/2013  . Alcohol Use: No     Comment: quit 2002  . Drug Use: Yes    Special: Marijuana     Comment: quit 2002  . Sexual Activity: Not on file   Other Topics Concern  . 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 Denies fever, malaise, headache or abrupt weight changes.  HEENT: Denies eye pain, eye redness, ear pain, ringing in the ears, wax buildup, runny nose, nasal congestion, bloody nose, or sore throat. Respiratory: Denies difficulty breathing, shortness of breath, cough or sputum production.   Cardiovascular: Denies chest pain, chest tightness, palpitations or swelling in the hands or feet.  GI: Pt reports difficulty swallowing. Denies constipation, diarrhea or blood in his stool. Neurological: Denies dizziness, difficulty with memory, difficulty with speech or problems with balance and coordination.   No other specific complaints in a complete review of systems (except as listed in HPI above).  Objective:   Physical Exam  BP 124/78 mmHg  Pulse 71  Temp(Src) 98.1 F (36.7 C) (Oral)  Wt 219 lb (99.338 kg)  SpO2 98% Wt Readings from Last 3 Encounters:  07/10/15 219 lb (99.338 kg)  01/24/15 235 lb (106.595 kg)  01/13/15 238 lb 12.8 oz (108.319 kg)    General: Appears his stated age, obese in NAD.Marland Kitchen HEENT: Head: normal shape and size; Eyes: sclera white, no icterus, conjunctiva pink, PERRLA and EOMs intact; Throat: + PND, throat erythematous but moist. Cardiovascular: Normal rate and rhythm. S1,S2 noted.  No murmur, rubs or gallops noted.  Pulmonary/Chest: Normal effort and positive vesicular breath sounds. No respiratory distress. No wheezes, rales or ronchi noted.    Musculoskeletal: Normal range of motion. No signs of joint swelling. No difficulty with gait.  Neurological: Alert and oriented.   BMET    Component Value Date/Time   NA 138 09/13/2013 2140   K 3.3* 09/13/2013 2140   CL 99 09/13/2013 2140   CO2 26 09/13/2013 2140   GLUCOSE 94 09/13/2013 2140   BUN 9 09/13/2013 2140   CREATININE 0.82 09/13/2013 2140   CALCIUM  9.1 09/13/2013 2140   GFRNONAA >90 09/13/2013 2140   GFRAA >90 09/13/2013 2140    Lipid Panel  No results found for: CHOL, TRIG, HDL, CHOLHDL, VLDL, LDLCALC  CBC    Component Value Date/Time   WBC 8.6 09/13/2013 2140   RBC 5.10 09/13/2013 2140   HGB 16.0 09/13/2013 2140   HCT 43.5 09/13/2013 2140   PLT 256 09/13/2013 2140   MCV 85.3 09/13/2013 2140   MCH 31.4 09/13/2013 2140   MCHC 36.8* 09/13/2013 2140   RDW 12.7 09/13/2013 2140   LYMPHSABS 3.5 09/13/2013 2140   MONOABS 0.7 09/13/2013 2140   EOSABS 0.2 09/13/2013 2140   BASOSABS 0.0 09/13/2013 2140    Hgb A1C No results found for: HGBA1C       Assessment & Plan:   Elevated blood pressure:  I think his blood pressure is fine He insists that his blood pressure is usually > 140/90, although he is asymptomatic He would like his BP lowered so that he can receive a steroid injection Advised him to take 2- 5 mg Norvasc tablets daily and RTC in 2 weeks to recheck BP  Difficulty swallowing:  Could be coming from PND He takes oral antihistamine daily Start Flonase daily If symptoms do not resolve, consider GI referral for further evaluation  Daytime sleepiness, snoring:  Will refer to pulm for evaluation for a sleep study  RTC in 2 weeks to recheck BP

## 2015-07-10 NOTE — Progress Notes (Signed)
Pre visit review using our clinic review tool, if applicable. No additional management support is needed unless otherwise documented below in the visit note. 

## 2015-07-21 DIAGNOSIS — K5790 Diverticulosis of intestine, part unspecified, without perforation or abscess without bleeding: Secondary | ICD-10-CM

## 2015-07-21 HISTORY — DX: Diverticulosis of intestine, part unspecified, without perforation or abscess without bleeding: K57.90

## 2015-07-31 ENCOUNTER — Ambulatory Visit (INDEPENDENT_AMBULATORY_CARE_PROVIDER_SITE_OTHER): Payer: PRIVATE HEALTH INSURANCE | Admitting: Family Medicine

## 2015-07-31 ENCOUNTER — Encounter: Payer: Self-pay | Admitting: Family Medicine

## 2015-07-31 VITALS — BP 130/76 | HR 76 | Temp 98.1°F | Wt 218.5 lb

## 2015-07-31 DIAGNOSIS — J302 Other seasonal allergic rhinitis: Secondary | ICD-10-CM

## 2015-07-31 DIAGNOSIS — R1032 Left lower quadrant pain: Secondary | ICD-10-CM

## 2015-07-31 DIAGNOSIS — I10 Essential (primary) hypertension: Secondary | ICD-10-CM | POA: Diagnosis not present

## 2015-07-31 DIAGNOSIS — G4710 Hypersomnia, unspecified: Secondary | ICD-10-CM

## 2015-07-31 DIAGNOSIS — R400 Somnolence: Secondary | ICD-10-CM

## 2015-07-31 MED ORDER — AMLODIPINE BESYLATE 10 MG PO TABS: ORAL_TABLET | ORAL | Status: DC

## 2015-07-31 NOTE — Assessment & Plan Note (Signed)
Gagging improved once he started flonase regularly.

## 2015-07-31 NOTE — Assessment & Plan Note (Signed)
Referred to pulm. Pending sleep study next month. Agree.

## 2015-07-31 NOTE — Progress Notes (Signed)
Pre visit review using our clinic review tool, if applicable. No additional management support is needed unless otherwise documented below in the visit note. 

## 2015-07-31 NOTE — Assessment & Plan Note (Signed)
Muscle strain vs diverticulitis - check CMP, CBC, lipase today. Hold miralax for now. rec clear liq diet for next 2 days. Red flags to seek urgent care discussed.

## 2015-07-31 NOTE — Assessment & Plan Note (Signed)
Chronic. Improved readings with higher amlodipine dose. Continue. Refilled today.

## 2015-07-31 NOTE — Progress Notes (Signed)
BP 130/76 mmHg  Pulse 76  Temp(Src) 98.1 F (36.7 C) (Oral)  Wt 218 lb 8 oz (99.111 kg)   CC: bp f/u visit  Subjective:    Patient ID: Jordan Buchanan, male    DOB: 05/01/1971, 44 y.o.   MRN: [id number]  HPI: Jordan Buchanan is a 44 y.o. male presenting on 07/31/2015 for Follow-up   Seen by Rollene Fare 7/21 - mildly elevated bp at rheum office (142/81) so they did not give him steroid injection in L shoulder. Amlodipine was increased to 10mg  daily. Tolerated higher dose well without ankle swelling.   Notices he's developed gag reflex. Advised to start flonase. This significantly helped.   Also referred to pulm for sleep study for daytime sleepiness and snoring. Scheduled for September.   Yesterday morning noticed pain developing LLQ of abdomen. 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 Today pain improving some. Laying on R side improves pain. Possible mild constipation. No fevers or urinary symptoms. H/o L kidney stone. No bulging in groin.   Relevant past medical, surgical, family and social history reviewed and updated as indicated. Interim medical history since our last visit reviewed. Allergies and medications reviewed and updated. Current Outpatient Prescriptions on File Prior to Visit  Medication Sig  . albuterol (VENTOLIN HFA) 108 (90 BASE) MCG/ACT inhaler Inhale 2 puffs into the lungs every 4 (four) hours as needed for wheezing or shortness of breath.  . cetirizine (ZYRTEC) 10 MG tablet Take 10 mg by mouth as needed.   . cyclobenzaprine (FLEXERIL) 10 MG tablet Take 0.5-1 tablets (5-10 mg total) by mouth 3 (three) times daily as needed for muscle spasms.  . DULERA 100-5 MCG/ACT AERO TAKE 2 PUFFS FIRST THING IN THE MORNING AND THEN ANOTHER 2 PUFFS ABOUT 12 HOURS LATER  . esomeprazole (NEXIUM) 40 MG capsule Take 40 mg by mouth daily at 12 noon.  . fluticasone (FLONASE) 50 MCG/ACT nasal spray Place 2 sprays into both nostrils daily.  . metoprolol succinate (TOPROL-XL) 50 MG 24  hr tablet TAKE 1 TABLET BY MOUTH EVERY DAY WITH OR IMMEDIATLEY FOLLOWING A MEAL  . naproxen sodium (ANAPROX) 550 MG tablet Take 550 mg by mouth daily as needed (pain).   No current facility-administered medications on file prior to visit.    Review of Systems Per HPI unless specifically indicated above     Objective:    BP 130/76 mmHg  Pulse 76  Temp(Src) 98.1 F (36.7 C) (Oral)  Wt 218 lb 8 oz (99.111 kg)  Wt Readings from Last 3 Encounters:  07/31/15 218 lb 8 oz (99.111 kg)  07/10/15 219 lb (99.338 kg)  01/24/15 235 lb (106.595 kg)    Physical Exam  Constitutional: He appears well-developed and well-nourished. No distress.  HENT:  Mouth/Throat: Oropharynx is clear and moist. No oropharyngeal exudate.  Cardiovascular: Normal rate, regular rhythm, normal heart sounds and intact distal pulses.   No murmur heard. Pulmonary/Chest: Effort normal and breath sounds normal. No respiratory distress. He has no wheezes. He has no rales.  Abdominal: Soft. Normal appearance and bowel sounds are normal. He exhibits no distension and no mass. There is no hepatosplenomegaly. Tenderness: mild. There is no rigidity, no rebound, no guarding, no CVA tenderness and negative Murphy's sign. No hernia. Hernia confirmed negative in the right inguinal area and confirmed negative in the left inguinal area.  Musculoskeletal: He exhibits no edema.  Skin: Skin is warm and dry. No rash noted.  Psychiatric: He has a normal mood and  affect.  Nursing note and vitals reviewed.     Assessment & Plan:   Problem List Items Addressed This Visit    Hypertension - Primary    Chronic. Improved readings with higher amlodipine dose. Continue. Refilled today.      Relevant Medications   amLODipine (NORVASC) 10 MG tablet   Seasonal allergies    Gagging improved once he started flonase regularly.      LLQ abdominal pain    Muscle strain vs diverticulitis - check CMP, CBC, lipase today. Hold miralax for  now. rec clear liq diet for next 2 days. Red flags to seek urgent care discussed.      Relevant Orders   Comprehensive metabolic panel   CBC with Differential/Platelet   Lipase   Daytime somnolence    Referred to pulm. Pending sleep study next month. Agree.          Follow up plan: Return if symptoms worsen or fail to improve.

## 2015-07-31 NOTE — Patient Instructions (Signed)
Increase amlodipine to 10mg  daily (new dose at pharmacy) For abdominal pain -check labwork today and change to clear liquid diet for next 24-48 hours. If fever, worsening pain, or blood in stool please let us know or seek urgent care. We will see how sleep evaluation goes.

## 2015-08-01 ENCOUNTER — Telehealth: Payer: Self-pay | Admitting: Family Medicine

## 2015-08-01 LAB — COMPREHENSIVE METABOLIC PANEL
ALK PHOS: 61 U/L (ref 39–117)
ALT: 27 U/L (ref 0–53)
AST: 23 U/L (ref 0–37)
Albumin: 4.5 g/dL (ref 3.5–5.2)
BILIRUBIN TOTAL: 1.4 mg/dL — AB (ref 0.2–1.2)
BUN: 13 mg/dL (ref 6–23)
CO2: 28 mEq/L (ref 19–32)
Calcium: 9.3 mg/dL (ref 8.4–10.5)
Chloride: 104 mEq/L (ref 96–112)
Creatinine, Ser: 0.97 mg/dL (ref 0.40–1.50)
GFR: 90.31 mL/min (ref >60.00)
GLUCOSE: 85 mg/dL (ref 70–99)
Potassium: 4.1 mEq/L (ref 3.5–5.1)
SODIUM: 139 meq/L (ref 135–145)
TOTAL PROTEIN: 7.2 g/dL (ref 6.0–8.3)

## 2015-08-01 LAB — CBC WITH DIFFERENTIAL/PLATELET
Basophils Absolute: 0 10*3/uL (ref 0.0–0.1)
Basophils Relative: 0.2 % (ref 0.0–3.0)
EOS ABS: 0.1 10*3/uL (ref 0.0–0.7)
Eosinophils Relative: 1.5 % (ref 0.0–5.0)
HCT: 45.8 % (ref 39.0–52.0)
Hemoglobin: 15.6 g/dL (ref 13.0–17.0)
LYMPHS PCT: 27.3 % (ref 12.0–46.0)
Lymphs Abs: 2.6 10*3/uL (ref 0.7–4.0)
MCHC: 34.1 g/dL (ref 30.0–36.0)
MCV: 88.2 fl (ref 78.0–100.0)
Monocytes Absolute: 0.6 10*3/uL (ref 0.1–1.0)
Monocytes Relative: 6.4 % (ref 3.0–12.0)
NEUTROS PCT: 64.6 % (ref 43.0–77.0)
Neutro Abs: 6.1 10*3/uL (ref 1.4–7.7)
PLATELETS: 277 10*3/uL (ref 150.0–400.0)
RBC: 5.2 Mil/uL (ref 4.22–5.81)
RDW: 12.9 % (ref 11.5–15.5)
WBC: 9.5 10*3/uL (ref 4.0–10.5)

## 2015-08-01 LAB — LIPASE: Lipase: 25 U/L (ref 11.0–59.0)

## 2015-08-01 NOTE — Telephone Encounter (Signed)
Patient had labs done yesterday.  Patient is calling to get the results.  Patient is on a clear liquid diet until he gets the results.

## 2015-08-01 NOTE — Telephone Encounter (Signed)
See results note. 

## 2015-08-03 ENCOUNTER — Emergency Department: Payer: PRIVATE HEALTH INSURANCE

## 2015-08-03 ENCOUNTER — Emergency Department
Admission: EM | Admit: 2015-08-03 | Discharge: 2015-08-04 | Disposition: A | Discharge status: Home / Self Care | Payer: PRIVATE HEALTH INSURANCE | Attending: Emergency Medicine | Admitting: Emergency Medicine

## 2015-08-03 ENCOUNTER — Encounter: Payer: Self-pay | Admitting: Radiology

## 2015-08-03 DIAGNOSIS — Z79811 Long term (current) use of aromatase inhibitors: Secondary | ICD-10-CM | POA: Insufficient documentation

## 2015-08-03 DIAGNOSIS — I10 Essential (primary) hypertension: Secondary | ICD-10-CM | POA: Insufficient documentation

## 2015-08-03 DIAGNOSIS — Z87891 Personal history of nicotine dependence: Secondary | ICD-10-CM | POA: Insufficient documentation

## 2015-08-03 DIAGNOSIS — Z7951 Long term (current) use of inhaled steroids: Secondary | ICD-10-CM | POA: Diagnosis not present

## 2015-08-03 DIAGNOSIS — K5900 Constipation, unspecified: Secondary | ICD-10-CM | POA: Insufficient documentation

## 2015-08-03 DIAGNOSIS — Z79899 Other long term (current) drug therapy: Secondary | ICD-10-CM | POA: Diagnosis not present

## 2015-08-03 DIAGNOSIS — R1032 Left lower quadrant pain: Secondary | ICD-10-CM

## 2015-08-03 HISTORY — DX: Diverticulosis of intestine, part unspecified, without perforation or abscess without bleeding: K57.90

## 2015-08-03 LAB — CBC WITH DIFFERENTIAL/PLATELET
BASOS PCT: 1 %
Basophils Absolute: 0.1 10*3/uL (ref 0–0.1)
EOS PCT: 2 %
Eosinophils Absolute: 0.2 10*3/uL (ref 0–0.7)
HCT: 44.9 % (ref 40.0–52.0)
Hemoglobin: 15.7 g/dL (ref 13.0–18.0)
Lymphocytes Relative: 37 %
Lymphs Abs: 3.2 10*3/uL (ref 1.0–3.6)
MCH: 30.1 pg (ref 26.0–34.0)
MCHC: 34.9 g/dL (ref 32.0–36.0)
MCV: 86.2 fL (ref 80.0–100.0)
MONO ABS: 0.6 10*3/uL (ref 0.2–1.0)
Monocytes Relative: 7 %
NEUTROS ABS: 4.5 10*3/uL (ref 1.4–6.5)
NEUTROS PCT: 53 %
Platelets: 249 10*3/uL (ref 150–440)
RBC: 5.21 MIL/uL (ref 4.40–5.90)
RDW: 13.2 % (ref 11.5–14.5)
WBC: 8.6 10*3/uL (ref 3.8–10.6)

## 2015-08-03 LAB — COMPREHENSIVE METABOLIC PANEL
ALBUMIN: 4.3 g/dL (ref 3.5–5.0)
ALT: 26 U/L (ref 17–63)
AST: 24 U/L (ref 15–41)
Alkaline Phosphatase: 63 U/L (ref 38–126)
Anion gap: 7 (ref 5–15)
BILIRUBIN TOTAL: 0.9 mg/dL (ref 0.3–1.2)
BUN: 9 mg/dL (ref 6–20)
CO2: 25 mmol/L (ref 22–32)
CREATININE: 0.86 mg/dL (ref 0.61–1.24)
Calcium: 9 mg/dL (ref 8.9–10.3)
Chloride: 107 mmol/L (ref 101–111)
GFR calc non Af Amer: >60 mL/min (ref >60)
GLUCOSE: 94 mg/dL (ref 65–99)
Potassium: 3.9 mmol/L (ref 3.5–5.1)
SODIUM: 139 mmol/L (ref 135–145)
Total Protein: 6.9 g/dL (ref 6.5–8.1)

## 2015-08-03 LAB — URINALYSIS COMPLETE WITH MICROSCOPIC (ARMC ONLY)
BACTERIA UA: NONE SEEN
Bilirubin Urine: NEGATIVE
Glucose, UA: NEGATIVE mg/dL
Hgb urine dipstick: NEGATIVE
Ketones, ur: NEGATIVE mg/dL
Leukocytes, UA: NEGATIVE
NITRITE: NEGATIVE
PH: 7 (ref 5.0–8.0)
Protein, ur: NEGATIVE mg/dL
SQUAMOUS EPITHELIAL / LPF: NONE SEEN
Specific Gravity, Urine: 1.009 (ref 1.005–1.030)
WBC UA: NONE SEEN WBC/hpf (ref 0–5)

## 2015-08-03 LAB — LIPASE, BLOOD: Lipase: 30 U/L (ref 22–51)

## 2015-08-03 MED ORDER — IOHEXOL 300 MG/ML  SOLN
100.0000 mL | Freq: Once | INTRAMUSCULAR | Status: AC | PRN
  Administered 2015-08-03: 100 mL via INTRAVENOUS

## 2015-08-03 MED ORDER — SODIUM CHLORIDE 0.9 % IV BOLUS (SEPSIS)
1000.0000 mL | Freq: Once | INTRAVENOUS | Status: AC
Start: 2015-08-03 — End: 2015-08-04
  Administered 2015-08-04: 1000 mL via INTRAVENOUS

## 2015-08-03 MED ORDER — IOHEXOL 240 MG/ML SOLN
25.0000 mL | Freq: Once | INTRAMUSCULAR | Status: AC | PRN
  Administered 2015-08-03: 25 mL via ORAL

## 2015-08-03 NOTE — ED Notes (Signed)
Pt. States he went to PCP on Thursday.  Pt. States primary looked for diverticulitis, states results have not returned.  Pt. States LLQ pain for the past week.  Pt. States constipation problems, but pt. Had bowel movement yesterday.

## 2015-08-03 NOTE — ED Notes (Signed)
Pt in with co left sided abd pain, saw pmd and had normal blood work.  No BM in about a week, no n.v. Or dysuria.

## 2015-08-04 ENCOUNTER — Telehealth: Payer: Self-pay

## 2015-08-04 ENCOUNTER — Encounter: Payer: Self-pay | Admitting: Family Medicine

## 2015-08-04 NOTE — ED Provider Notes (Signed)
[facility] Emergency Department Provider Note  ____________________________________________  Time seen: Approximately  2316 PM  I have reviewed the triage vital signs and the nursing notes.   HISTORY  Chief Complaint Abdominal Pain    HPI Jordan Buchanan is a 44 y.o. male who comes in with 5 days of sharp stabbing pain in his left side. He reports that initially anytime he would bend over or coughs the pain will get worse. The patient is also not had a bowel movement in over a week. The patient now feels an ache with some pressure. He has been taking MiraLAX Dulcolax Ex-Lax without any relief. The patient into his primary care physician 4 days ago and was told that everything in his blood work was okay. He has not taken MiraLAX since Thursday but normally does not have any problems with bowel movements. The patient has had no vomiting and reports this pain is a 6 out of 10 in intensity. He has not taken anything for pain and is been able to pass gas. Sensory decided to come in for evaluation this evening.   Past Medical History  Diagnosis Date  . Hypertension     h/o LVH, resolved  . Migraines   . Seasonal allergies   . Arrhythmia     PALPITATIONS AND PVC - improved with cutting down on caffeine  . GERD (gastroesophageal reflux disease)   . 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 Estanislado Pandy)  . Tremor   . Hepatic steatosis   . Cough variant asthma 05/28/2014    05/27/2014 p extensive coaching HFA effectiveness =    75% > try dulera 100 2bid  - PFT's 07/03/2014 FEV1 4.15 (98%) ratio 89 and no chang p B2 and nl fef25-75  And nl dlco      Patient Active Problem List   Diagnosis Date Noted  . LLQ abdominal pain 07/31/2015  . Daytime somnolence 07/31/2015  . Neck pain on right side 01/13/2015  . Cough variant asthma 05/28/2014  . Pruritic condition 04/18/2014  . Right low back pain 09/18/2013  . Hepatic steatosis   . Prostatitis  05/09/2013  . Migraines   . Seasonal allergies   . GERD (gastroesophageal reflux disease)   . Arthritis   . Hypertension     Past Surgical History  Procedure Laterality Date  . Rotator cuff repair  2009    left  . Other surgical history  1992    facial surgery  . Umbilical hernia repair  05/10/2012    Procedure: HERNIA REPAIR UMBILICAL ADULT;  Surgeon: Harl Bowie, MD;  Location: [facility];  Service: General;  Laterality: N/A;    Current Outpatient Rx  Name  Route  Sig  Dispense  Refill  . albuterol (VENTOLIN HFA) 108 (90 BASE) MCG/ACT inhaler   Inhalation   Inhale 2 puffs into the lungs every 4 (four) hours as needed for wheezing or shortness of breath.   1 Inhaler   6   . amLODipine (NORVASC) 10 MG tablet      TAKE 1 TABLET (10 MG TOTAL) BY MOUTH DAILY.   90 tablet   3   . cetirizine (ZYRTEC) 10 MG tablet   Oral   Take 10 mg by mouth as needed.          . cyclobenzaprine (FLEXERIL) 10 MG tablet   Oral   Take 0.5-1 tablets (5-10 mg total) by mouth 3 (three) times daily  as needed for muscle spasms.   40 tablet   0   . DULERA 100-5 MCG/ACT AERO      TAKE 2 PUFFS FIRST THING IN THE MORNING AND THEN ANOTHER 2 PUFFS ABOUT 12 HOURS LATER   13 Inhaler   0   . esomeprazole (NEXIUM) 40 MG capsule   Oral   Take 40 mg by mouth daily at 12 noon.         . fluticasone (FLONASE) 50 MCG/ACT nasal spray   Each Nare   Place 2 sprays into both nostrils daily.   16 g   6   . metoprolol succinate (TOPROL-XL) 50 MG 24 hr tablet      TAKE 1 TABLET BY MOUTH EVERY DAY WITH OR IMMEDIATLEY FOLLOWING A MEAL   30 tablet   11   . naproxen sodium (ANAPROX) 550 MG tablet   Oral   Take 550 mg by mouth daily as needed (pain).           Allergies Review of patient's allergies indicates no known allergies.  Family History  Problem Relation Age of Onset  . Asthma Father   . Hypertension Brother     obese  . CAD Paternal Grandfather     MI x2  .  Multiple sclerosis Brother   . Arthritis Father     father, brother, Pgrandfather  . Stroke Neg Hx   . Cancer Neg Hx   . Chronic bronchitis Father     Social History Social History  Substance Use Topics  . Smoking status: Former Smoker -- 1.00 packs/day for 13 years    Types: Cigarettes    Quit date: 12/21/1999  . Smokeless tobacco: Former Systems developer    Types: Snuff    Quit date: 12/20/2013  . Alcohol Use: No     Comment: quit 2002    Review of Systems Constitutional: No fever/chills Eyes: No visual changes. ENT: No sore throat. Cardiovascular: Denies chest pain. Respiratory: Denies shortness of breath. Gastrointestinal: abdominal pain.  No nausea, no vomiting.  No diarrhea.  constipation. Genitourinary: Negative for dysuria. Musculoskeletal: Negative for back pain. Skin: Negative for rash. Neurological: Negative for headaches, focal weakness or numbness.  10-point ROS otherwise negative.  ____________________________________________   PHYSICAL EXAM:  VITAL SIGNS: ED Triage Vitals  Enc Vitals Group     BP 08/03/15 2037 141/87 mmHg     Pulse Rate 08/03/15 2037 69     Resp 08/03/15 2037 18     Temp 08/03/15 2037 98.5 F (36.9 C)     Temp Source 08/03/15 2037 Oral     SpO2 08/03/15 2037 98 %     Weight 08/03/15 2037 215 lb (97.523 kg)     Height 08/03/15 2037 5' 10" (1.778 m)     Head Cir --      Peak Flow --      Pain Score 08/03/15 2038 6     Pain Loc --      Pain Edu? --      Excl. in [place]? --     Constitutional: Alert and oriented. Well appearing and in mild distress. Eyes: Conjunctivae are normal. PERRL. EOMI. Head: Atraumatic. Nose: No congestion/rhinnorhea. Mouth/Throat: Mucous membranes are moist.  Oropharynx non-erythematous. Cardiovascular: Normal rate, regular rhythm. Grossly normal heart sounds.  Good peripheral circulation. Respiratory: Normal respiratory effort.  No retractions. Lungs CTAB. Gastrointestinal: Soft with left-sided tenderness to  palpation No distention. Positive bowel sounds Genitourinary: Deferred Musculoskeletal: No lower extremity tenderness nor edema.  Neurologic:  Normal speech and language.  Skin:  Skin is warm, dry and intact.  Psychiatric: Mood and affect are normal.   ____________________________________________   LABS (all labs ordered are listed, but only abnormal results are displayed)  Labs Reviewed  URINALYSIS COMPLETEWITH MICROSCOPIC ([place]) - Abnormal; Notable for the following:    Color, Urine STRAW (*)    APPearance CLEAR (*)    All other components within normal limits  CBC WITH DIFFERENTIAL/PLATELET  LIPASE, BLOOD  COMPREHENSIVE METABOLIC PANEL   ____________________________________________  EKG  None ____________________________________________  RADIOLOGY  CT abdomen and pelvis: Mild sigmoid diverticulosis without CT findings of acute diverticulitis, nonobstructing 5 mm right nephrolithiasis ____________________________________________   PROCEDURES  Procedure(s) performed: None  Critical Care performed: No  ____________________________________________   INITIAL IMPRESSION / ASSESSMENT AND PLAN / ED COURSE  Pertinent labs & imaging results that were available during my care of the patient were reviewed by me and considered in my medical decision making (see chart for details).  This is a 44 year old male who comes in with left-sided pain that has been going on for 4-5 days. The patient reports that the pain and the symptoms have not been going away. He also has not had a bowel movement in a week. I will evaluate the patient with a CT for possible obstruction and then reassess the patient once I received the CT results.  The patient's CT scan is unremarkable. I will discharge him to home to have him follow-up with his primary care physician. I did inform him that he can resume taking the MiraLAX as well as his Metamucil to help with his stool and his bowel  movement. ____________________________________________   FINAL CLINICAL IMPRESSION(S) / ED DIAGNOSES  Final diagnoses:  Left lower quadrant pain  Constipation, unspecified constipation type      Loney Hering, MD 08/04/15 [phone]

## 2015-08-04 NOTE — Telephone Encounter (Signed)
plz call for update today. What bowel regimen is he taking and has he had stool over weekend?

## 2015-08-04 NOTE — Telephone Encounter (Signed)
Pt seen [facility] ED on 08/03/15.

## 2015-08-04 NOTE — ED Notes (Signed)
Pt. Mayes home with wife.  Pt. Stated he had to work in the morning and had to leave.

## 2015-08-04 NOTE — Telephone Encounter (Signed)
PLEASE NOTE: All timestamps contained within this report are represented as [place] Standard Time. CONFIDENTIALTY NOTICE: This fax transmission is intended only for the addressee. It contains information that is legally privileged, confidential or otherwise protected from use or disclosure. If you are not the intended recipient, you are strictly prohibited from reviewing, disclosing, copying using or disseminating any of this information or taking any action in reliance on or regarding this information. If you have received this fax in error, please notify us immediately by telephone so that we can arrange for its return to [place]. Phone: [phone], Toll-Free: [phone], Fax: [phone] Page: 1 of 2 Call Id: [id number] Bolt Patient Name: Jordan Buchanan Gender: Male DOB: 20-Apr-1971 Age: 44 Y 9 M 5 D Return Phone Number: [phone] (Primary), [phone] (Secondary) Address: declined City/State/Zip: [place] Client Zena Night - Client Client Site [facility] Physician Ria Bush Contact Type Call Call Type Triage / Clinical Relationship To Patient Self Return Phone Number [phone] (Primary) Chief Complaint Paging or Request for Consult Initial Comment Caller States office did not call on test results and he wants to know if he can eat or not PreDisposition Did not know what to do Nurse Assessment Nurse: Martyn Ehrich, RN, Solmon Ice Date/Time (Cando Time): 08/01/2015 7:11:53 PM Confirm and document reason for call. If symptomatic, describe symptoms. ---PT saw MD above yesterday for BP check and the day before hand he was having pain in abdomen onset Weds (still hurts now - pain level is 7) and lab work was done and MD told him to stay on clear liquids for 48 h but if blood in stool or pain is worse seek or fever urgent care or  call.. Pt called the office today and MD had not seen blood work yet. He has been waiting for MD to call. They were looking to see if he had diverticulitis or infection in intestines. No medications were ordered. No fever and no blood in stool. PT is constipated and has not had BM 3-4 days. He took miralax Weds 7 pm and MD told him not to take it for now bc he needed to rest bowel. Has the patient traveled out of the country within the last 30 days? ---No Does the patient require triage? ---Yes Related visit to physician within the last 2 weeks? ---Yes Does the PT have any chronic conditions? (i.e. diabetes, asthma, etc.) ---No Guidelines Guideline Title Affirmed Question Affirmed Notes Nurse Date/Time Eilene Ghazi Time) Abdominal Pain - Male [1] SEVERE pain (e.g., excruciating) AND [2] present > 1 hour [place], RN, [facility] 0/93/2355 7:32:20 PM Disp. Time Eilene Ghazi Time) Disposition Final User 08/01/2015 7:21:07 PM Paged On Call back to [facility], [place], [facility] 2/54/2706 2:37:62 PM Call Completed Martyn Ehrich RN, Solmon Ice 08/20/5175 1:60:73 PM Go to ED Now Yes Martyn Ehrich, RN, Solmon Ice PLEASE NOTE: All timestamps contained within this report are represented as [place] Standard Time. CONFIDENTIALTY NOTICE: This fax transmission is intended only for the addressee. It contains information that is legally privileged, confidential or otherwise protected from use or disclosure. If you are not the intended recipient, you are strictly prohibited from reviewing, disclosing, copying using or disseminating any of this information or taking any action in reliance on or regarding this information. If you have received this fax in error, please notify us immediately by telephone so that we can arrange for its return to [place]. Phone: [phone],  Toll-Free: [phone], Fax: [phone] Page: 2 of 2 Call Id: [id number] Caller Understands: Yes Disagree/Comply: Comply Care Advice Given Per Guideline GO TO ED NOW:  You need to be seen in the Emergency Department. Go to the ER at ___________ [place] now. Drive carefully. DRIVING: Another adult should drive. NOTHING BY MOUTH: Do not eat or drink anything for now. (Reason: condition may need surgery and general anesthesia) * Please bring a list of your current medicines when you go to the Emergency Department (ER). * It is also a good idea to bring the pill bottles too. This will help the doctor to make certain you are taking the right medicines and the right dose. After Care Instructions Given Call Event Type User Date / Time Description Comments User: Daphene Calamity, RN Date/Time Eilene Ghazi Time): 08/01/2015 7:21:32 PM gave caller triage advice and offered to call MD and he wanted MD paged User: Daphene Calamity, RN Date/Time Eilene Ghazi Time): 08/01/2015 7:26:12 PM gave caller MD response. He is refusing to go to ER. Told him ER is better than UC in this situation Referrals GO TO FACILITY REFUSED Paging DoctorName Phone DateTime Result/Outcome Message Type Notes Arnette Norris [phone] 08/01/2015 7:21:07 PM Paged On Call Back to Call Center Doctor Paged Please call Felicia @ [phone] at Call [place], [place] 08/01/2015 7:23:49 PM Spoke with On Call - General Message Result She agrees with disposition - he needs to be seen now

## 2015-08-04 NOTE — Telephone Encounter (Signed)
Noted  

## 2015-08-04 NOTE — Discharge Instructions (Signed)
Abdominal Pain Many things can cause abdominal pain. Usually, abdominal pain is not caused by a disease and will improve without treatment. It can often be observed and treated at home. Your health care provider will do a physical exam and possibly order blood tests and X-rays to help determine the seriousness of your pain. However, in many cases, more time must pass before a clear cause of the pain can be found. Before that point, your health care provider may not know if you need more testing or further treatment. HOME CARE INSTRUCTIONS  Monitor your abdominal pain for any changes. The following actions may help to alleviate any discomfort you are experiencing:  Only take over-the-counter or prescription medicines as directed by your health care provider.  Do not take laxatives unless directed to do so by your health care provider.  Try a clear liquid diet (broth, tea, or water) as directed by your health care provider. Slowly move to a bland diet as tolerated. SEEK MEDICAL CARE IF:  You have unexplained abdominal pain.  You have abdominal pain associated with nausea or diarrhea.  You have pain when you urinate or have a bowel movement.  You experience abdominal pain that wakes you in the night.  You have abdominal pain that is worsened or improved by eating food.  You have abdominal pain that is worsened with eating fatty foods.  You have a fever. SEEK IMMEDIATE MEDICAL CARE IF:   Your pain does not go away within 2 hours.  You keep throwing up (vomiting).  Your pain is felt only in portions of the abdomen, such as the right side or the left lower portion of the abdomen.  You pass bloody or black tarry stools. MAKE SURE YOU:  Understand these instructions.   Will watch your condition.   Will get help right away if you are not doing well or get worse.  Document Released: 09/15/2005 Document Revised: 12/11/2013 Document Reviewed: 08/15/2013 [facility] Patient Information  2015 [place], [place]. This information is not intended to replace advice given to you by your health care provider. Make sure you discuss any questions you have with your health care provider.  Constipation Constipation is when a person:  Poops (has a bowel movement) less than 3 times a week.  Has a hard time pooping.  Has poop that is dry, hard, or bigger than normal. HOME CARE   Eat foods with a lot of fiber in them. This includes fruits, vegetables, beans, and whole grains such as brown rice.  Avoid fatty foods and foods with a lot of sugar. This includes french fries, hamburgers, cookies, candy, and soda.  If you are not getting enough fiber from food, take products with added fiber in them (supplements).  Drink enough fluid to keep your pee (urine) clear or pale yellow.  Exercise on a regular basis, or as told by your doctor.  Go to the restroom when you feel like you need to poop. Do not hold it.  Only take medicine as told by your doctor. Do not take medicines that help you poop (laxatives) without talking to your doctor first. GET HELP RIGHT AWAY IF:   You have bright red blood in your poop (stool).  Your constipation lasts more than 4 days or gets worse.  You have belly (abdominal) or butt (rectal) pain.  You have thin poop (as thin as a pencil).  You lose weight, and it cannot be explained. MAKE SURE YOU:   Understand these instructions.  Will  watch your condition.  Will get help right away if you are not doing well or get worse. Document Released: 05/24/2008 Document Revised: 12/11/2013 Document Reviewed: 09/17/2013 [facility] Patient Information 2015 [place], [place]. This information is not intended to replace advice given to you by your health care provider. Make sure you discuss any questions you have with your health care provider.

## 2015-08-04 NOTE — Telephone Encounter (Signed)
Spoke with patient. He said he took a stool softener, 2 doses of Miralax and ate a Fiber One bar and has had a BM this AM and is feeling MUCH better now.

## 2015-08-04 NOTE — ED Notes (Signed)
Coming out of another pt. Room when noticed pt. And pt. Wife coming out of room #12.  Pt. Stated they were suppose to leave an hour ago.  Got pt. Discharge instructions, no meds.  Pt. Stated he would follow up with Family medicine.  Pt. And pt. Wife in hurry to leave.

## 2015-08-13 ENCOUNTER — Other Ambulatory Visit: Payer: Self-pay | Admitting: Orthopaedic Surgery

## 2015-08-13 DIAGNOSIS — M25512 Pain in left shoulder: Secondary | ICD-10-CM

## 2015-08-21 ENCOUNTER — Other Ambulatory Visit: Payer: Self-pay | Admitting: Family Medicine

## 2015-08-28 ENCOUNTER — Ambulatory Visit
Admission: RE | Admit: 2015-08-28 | Discharge: 2015-08-28 | Disposition: A | Discharge status: Home / Self Care | Payer: PRIVATE HEALTH INSURANCE | Source: Ambulatory Visit | Attending: Orthopaedic Surgery | Admitting: Orthopaedic Surgery

## 2015-08-28 DIAGNOSIS — M25512 Pain in left shoulder: Secondary | ICD-10-CM

## 2015-08-28 MED ORDER — IOHEXOL 180 MG/ML  SOLN
15.0000 mL | Freq: Once | INTRAMUSCULAR | Status: DC | PRN
  Administered 2015-08-28: 15 mL via INTRA_ARTICULAR

## 2015-09-08 ENCOUNTER — Encounter: Payer: Self-pay | Admitting: Pulmonary Disease

## 2015-09-08 ENCOUNTER — Ambulatory Visit (INDEPENDENT_AMBULATORY_CARE_PROVIDER_SITE_OTHER): Payer: PRIVATE HEALTH INSURANCE | Admitting: Pulmonary Disease

## 2015-09-08 VITALS — BP 120/84 | HR 83 | Ht 70.0 in | Wt 215.4 lb

## 2015-09-08 DIAGNOSIS — E669 Obesity, unspecified: Secondary | ICD-10-CM

## 2015-09-08 DIAGNOSIS — R0683 Snoring: Secondary | ICD-10-CM

## 2015-09-08 NOTE — Progress Notes (Deleted)
   Subjective:    Patient ID: Allan Minotti, male    DOB: 1971-07-24, 44 y.o.   MRN: [id number]  HPI    Review of Systems  Constitutional: Negative for fever and unexpected weight change.  HENT: Negative for congestion, dental problem, ear pain, nosebleeds, postnasal drip, rhinorrhea, sinus pressure, sneezing, sore throat and trouble swallowing.   Eyes: Negative for redness and itching.  Respiratory: Negative for cough, chest tightness, shortness of breath and wheezing.   Cardiovascular: Negative for palpitations and leg swelling.  Gastrointestinal: Negative for nausea and vomiting.  Genitourinary: Negative for dysuria.  Musculoskeletal: Negative for joint swelling.  Skin: Negative for rash.  Neurological: Negative for headaches.  Hematological: Does not bruise/bleed easily.  Psychiatric/Behavioral: Negative for dysphoric mood. The patient is not nervous/anxious.        Objective:   Physical Exam        Assessment & Plan:

## 2015-09-08 NOTE — Progress Notes (Signed)
Chief Complaint  Patient presents with  . SLEEP CONSULT    pt referred by Dr. Garnette Gunner. pt states he was having some snoring about 6 months ago and was concerned. pt states in the past 3 month he hasnt snored. pt states he had trouble staying asleep. pt was waking up restless.  epworth score 12    History of Present Illness: Jordan Buchanan is a 44 y.o. male for evaluation of sleep problems.  He was seen recently by primary care.  He has hx of HTN.  He has family hx of sleep apnea.  He snored, and had trouble staying asleep at night.  He was feeling tired during the day.  He was advised that he was drinking too much soda >> he would drink 7 to 8 cans per day.  He stopped drinking soda, modified his diet, and started exercising more.  With these changes he has lost about 30 lbs.  With this weight loss his sleep has gotten better, and his wife no longer hears him snoring.   He goes to sleep at 9 pm.  He falls asleep 10.  He wakes up some times to use the bathroom.  He gets out of bed at 420 am.  He feels okay in the morning.  He denies morning headache.  He does not use anything to help him fall sleep or stay awake.  He denies sleep walking, sleep talking, bruxism, or nightmares.  There is no history of restless legs.  He denies sleep hallucinations, sleep paralysis, or cataplexy.  The Epworth score is 12 out of 24.  Elgar Scoggins  has a past medical history of Hypertension; Migraines; Seasonal allergies; Arrhythmia; GERD (gastroesophageal reflux disease); Spondyloarthropathy; Tremor; Hepatic steatosis; Cough variant asthma (05/28/2014); and Diverticulosis (07/2015).  Keidan Aumiller  has past surgical history that includes Rotator cuff repair (2009); Other surgical history (9379); and Umbilical hernia repair (05/10/2012).  Prior to Admission medications   Medication Sig Start Date End Date Taking? Authorizing Provider  albuterol (VENTOLIN HFA) 108 (90 BASE) MCG/ACT inhaler Inhale 2 puffs into the lungs  every 4 (four) hours as needed for wheezing or shortness of breath. 06/03/14  Yes Tanda Rockers, MD  amLODipine (NORVASC) 10 MG tablet TAKE 1 TABLET (10 MG TOTAL) BY MOUTH DAILY. 07/31/15  Yes Ria Bush, MD  cetirizine (ZYRTEC) 10 MG tablet Take 10 mg by mouth as needed.    Yes Historical Provider, MD  cyclobenzaprine (FLEXERIL) 10 MG tablet Take 0.5-1 tablets (5-10 mg total) by mouth 3 (three) times daily as needed for muscle spasms. 01/22/15  Yes Ria Bush, MD  DULERA 100-5 MCG/ACT AERO TAKE 2 PUFFS FIRST THING IN THE MORNING AND THEN ANOTHER 2 PUFFS ABOUT 12 HOURS LATER 06/02/15  Yes Tanda Rockers, MD  esomeprazole (NEXIUM) 40 MG capsule Take 40 mg by mouth daily at 12 noon.   Yes Historical Provider, MD  fluticasone (FLONASE) 50 MCG/ACT nasal spray Place 2 sprays into both nostrils daily. 05/13/15  Yes Ria Bush, MD  metoprolol succinate (TOPROL-XL) 50 MG 24 hr tablet TAKE 1 TABLET BY MOUTH EVERY DAY WITH OR IMMEDIATLEY FOLLOWING A MEAL 12/21/14  Yes Ria Bush, MD  naproxen sodium (ANAPROX) 550 MG tablet Take 550 mg by mouth daily as needed (pain).   Yes Historical Provider, MD    No Known Allergies  His family history includes Allergies in his father; Arthritis in his father; Asthma in his father; CAD in his paternal grandfather; Chronic bronchitis in his father;  Hypertension in his brother; Multiple sclerosis in his brother. There is no history of Stroke or Cancer.  He  reports that he quit smoking about 15 years ago. His smoking use included Cigarettes. He has a 13 pack-year smoking history. He quit smokeless tobacco use about 20 months ago. His smokeless tobacco use included Snuff. He reports that he uses illicit drugs (Marijuana). He reports that he does not drink alcohol.  Review of Systems  Constitutional: Negative for fever and unexpected weight change.  HENT: Negative for congestion, dental problem, ear pain, nosebleeds, postnasal drip, rhinorrhea, sinus  pressure, sneezing, sore throat and trouble swallowing.   Eyes: Negative for redness and itching.  Respiratory: Negative for cough, chest tightness, shortness of breath and wheezing.   Cardiovascular: Negative for palpitations and leg swelling.  Gastrointestinal: Negative for nausea and vomiting.  Genitourinary: Negative for dysuria.  Musculoskeletal: Negative for joint swelling.  Skin: Negative for rash.  Neurological: Negative for headaches.  Hematological: Does not bruise/bleed easily.  Psychiatric/Behavioral: Negative for dysphoric mood. The patient is not nervous/anxious.    Physical Exam: BP 120/84 mmHg  Pulse 83  Ht 5\' 10"  (1.778 m)  Wt 215 lb 6.4 oz (97.705 kg)  BMI 30.91 kg/m2  SpO2 97%  General - No distress ENT - No sinus tenderness, no oral exudate, no LAN, no thyromegaly, TM clear, pupils equal/reactive, MP 3 Cardiac - s1s2 regular, no murmur, pulses symmetric Chest - No wheeze/rales/dullness, good air entry, normal respiratory excursion Back - No focal tenderness Abd - Soft, non-tender, no organomegaly, + bowel sounds Ext - No edema Neuro - Normal strength, cranial nerves intact Skin - No rashes Psych - Normal mood, and behavior  Discussion: He has snoring, and has hx of HTN.  His sleep and snoring have improved with weight loss.  He does have family history of sleep apnea, but his other family members with sleep apnea all weight more than 300 lbs.  Assessment/plan:  Snoring >> improved with weight loss. Plan: - defer further sleep testing for now - advised that if he regains weight, then he might need additional testing for sleep apnea  Obesity. Plan: - encouraged him to continue with his diet/exercise regimen    Chesley Mires, M.D. Pager [phone]

## 2015-09-08 NOTE — Patient Instructions (Signed)
Follow up with pulmonary/sleep medicine as needed

## 2015-09-26 ENCOUNTER — Other Ambulatory Visit: Payer: Self-pay | Admitting: Orthopaedic Surgery

## 2015-11-30 ENCOUNTER — Other Ambulatory Visit: Payer: Self-pay | Admitting: Family Medicine

## 2015-12-21 HISTORY — PX: NASAL RECONSTRUCTION WITH SEPTAL REPAIR: SHX5665

## 2015-12-23 ENCOUNTER — Ambulatory Visit (INDEPENDENT_AMBULATORY_CARE_PROVIDER_SITE_OTHER): Payer: PRIVATE HEALTH INSURANCE | Admitting: Family

## 2015-12-23 ENCOUNTER — Encounter: Payer: Self-pay | Admitting: Family

## 2015-12-23 VITALS — BP 122/88 | HR 79 | Temp 97.8°F | Resp 16 | Ht 68.0 in | Wt 228.0 lb

## 2015-12-23 DIAGNOSIS — J069 Acute upper respiratory infection, unspecified: Secondary | ICD-10-CM | POA: Diagnosis not present

## 2015-12-23 MED ORDER — CEFUROXIME AXETIL 250 MG PO TABS: 250.0000 mg | ORAL_TABLET | Freq: Two times a day (BID) | ORAL | Status: DC

## 2015-12-23 NOTE — Assessment & Plan Note (Signed)
Symptoms and exam consistent with acute upper respiratory infection. Given worsening over the past 2 days start Ceftin. Continue over-the-counter medications as needed for symptom relief and supportive care. Follow-up if symptoms worsen or fail to improve.

## 2015-12-23 NOTE — Progress Notes (Signed)
Subjective:    Patient ID: Jordan Buchanan, male    DOB: December 07, 1971, 45 y.o.   MRN: [id number]  Chief Complaint  Patient presents with  . Nasal Congestion    chest congestion, sinus pressure and headache, not much of a cough, SOB    HPI:  Jordan Buchanan is a 45 y.o. male who  has a past medical history of Hypertension; Migraines; Seasonal allergies; Arrhythmia; GERD (gastroesophageal reflux disease); Spondyloarthropathy; Tremor; Hepatic steatosis; Cough variant asthma (05/28/2014); and Diverticulosis (07/2015). and presents today for an acute office visit.  This is a new problem. Associated symptoms of chest congestion, sinus pressure, headache, and slight cough with shortness of breath has ben going on for about a week. Modifying factors include Dayquil, and Tylenol cold which has not helped very much. Denies fever. Course of the symptoms has progressively worsened since the initial onset. Denies any recent antibiotic use.  No Known Allergies   Current Outpatient Prescriptions on File Prior to Visit  Medication Sig Dispense Refill  . albuterol (VENTOLIN HFA) 108 (90 BASE) MCG/ACT inhaler Inhale 2 puffs into the lungs every 4 (four) hours as needed for wheezing or shortness of breath. 1 Inhaler 6  . amLODipine (NORVASC) 10 MG tablet TAKE 1 TABLET (10 MG TOTAL) BY MOUTH DAILY. 90 tablet 3  . cetirizine (ZYRTEC) 10 MG tablet Take 10 mg by mouth as needed.     . cyclobenzaprine (FLEXERIL) 10 MG tablet Take 0.5-1 tablets (5-10 mg total) by mouth 3 (three) times daily as needed for muscle spasms. 40 tablet 0  . DULERA 100-5 MCG/ACT AERO TAKE 2 PUFFS FIRST THING IN THE MORNING AND THEN ANOTHER 2 PUFFS ABOUT 12 HOURS LATER 13 Inhaler 0  . esomeprazole (NEXIUM) 40 MG capsule Take 40 mg by mouth daily at 12 noon.    . fluticasone (FLONASE) 50 MCG/ACT nasal spray Place 2 sprays into both nostrils daily. 16 g 6  . metoprolol succinate (TOPROL-XL) 50 MG 24 hr tablet TAKE 1 TABLET BY MOUTH EVERY DAY WITH  OR IMMEDIATLEY FOLLOWING A MEAL 30 tablet 11  . naproxen sodium (ANAPROX) 550 MG tablet Take 550 mg by mouth daily as needed (pain).     No current facility-administered medications on file prior to visit.    Review of Systems  Constitutional: Negative for fever and chills.  HENT: Positive for congestion, sinus pressure and sore throat.   Respiratory: Positive for cough and shortness of breath. Negative for chest tightness.   Neurological: Positive for headaches.      Objective:    BP 122/88 mmHg  Pulse 79  Temp(Src) 97.8 F (36.6 C) (Oral)  Resp 16  Ht 5\' 8"  (1.727 m)  Wt 228 lb (103.42 kg)  BMI 34.68 kg/m2  SpO2 97% Nursing note and vital signs reviewed.  Physical Exam  Constitutional: He is oriented to person, place, and time. He appears well-developed and well-nourished. No distress.  HENT:  Right Ear: Hearing, tympanic membrane, external ear and ear canal normal.  Left Ear: Hearing, tympanic membrane, external ear and ear canal normal.  Nose: Nose normal. Right sinus exhibits no maxillary sinus tenderness and no frontal sinus tenderness. Left sinus exhibits no maxillary sinus tenderness and no frontal sinus tenderness.  Mouth/Throat: Uvula is midline, oropharynx is clear and moist and mucous membranes are normal.  Cardiovascular: Normal rate, regular rhythm, normal heart sounds and intact distal pulses.   Pulmonary/Chest: Effort normal and breath sounds normal.  Neurological: He is alert and oriented to person,  place, and time.  Skin: Skin is warm and dry.  Psychiatric: He has a normal mood and affect. His behavior is normal. Judgment and thought content normal.       Assessment & Plan:   Problem List Items Addressed This Visit      Respiratory   Acute upper respiratory infection - Primary    Symptoms and exam consistent with acute upper respiratory infection. Given worsening over the past 2 days start Ceftin. Continue over-the-counter medications as needed for  symptom relief and supportive care. Follow-up if symptoms worsen or fail to improve.      Relevant Medications   cefUROXime (CEFTIN) 250 MG tablet

## 2015-12-23 NOTE — Progress Notes (Signed)
Pre visit review using our clinic review tool, if applicable. No additional management support is needed unless otherwise documented below in the visit note. 

## 2015-12-23 NOTE — Patient Instructions (Signed)
Thank you for choosing  HealthCare.  Summary/Instructions:  Your prescription(s) have been submitted to your pharmacy or been printed and provided for you. Please take as directed and contact our office if you believe you are having problem(s) with the medication(s) or have any questions.  If your symptoms worsen or fail to improve, please contact our office for further instruction, or in case of emergency go directly to the emergency room at the closest medical facility.    General Recommendations:    Please drink plenty of fluids.  Get plenty of rest   Sleep in humidified air  Use saline nasal sprays  Netti pot   OTC Medications:  Decongestants - helps relieve congestion   Flonase (generic fluticasone) or Nasacort (generic triamcinolone) - please make sure to use the "cross-over" technique at a 45 degree angle towards the opposite eye as opposed to straight up the nasal passageway.   Sudafed (generic pseudoephedrine - Note this is the one that is available behind the pharmacy counter); Products with phenylephrine (-PE) may also be used but is often not as effective as pseudoephedrine.   If you have HIGH BLOOD PRESSURE - Coricidin HBP; AVOID any product that is -D as this contains pseudoephedrine which may increase your blood pressure.  Afrin (oxymetazoline) every 6-8 hours for up to 3 days.   Allergies - helps relieve runny nose, itchy eyes and sneezing   Claritin (generic loratidine), Allegra (fexofenidine), or Zyrtec (generic cyrterizine) for runny nose. These medications should not cause drowsiness.  Note - Benadryl (generic diphenhydramine) may be used however may cause drowsiness  Cough -   Delsym or Robitussin (generic dextromethorphan)  Expectorants - helps loosen mucus to ease removal   Mucinex (generic guaifenesin) as directed on the package.  Headaches / General Aches   Tylenol (generic acetaminophen) - DO NOT EXCEED 3 grams (3,000 mg) in a 24  hour time period  Advil/Motrin (generic ibuprofen)   Sore Throat -   Salt water gargle   Chloraseptic (generic benzocaine) spray or lozenges / Sucrets (generic dyclonine)    Upper Respiratory Infection, Adult Most upper respiratory infections (URIs) are a viral infection of the air passages leading to the lungs. A URI affects the nose, throat, and upper air passages. The most common type of URI is nasopharyngitis and is typically referred to as "the common cold." URIs run their course and usually go away on their own. Most of the time, a URI does not require medical attention, but sometimes a bacterial infection in the upper airways can follow a viral infection. This is called a secondary infection. Sinus and middle ear infections are common types of secondary upper respiratory infections. Bacterial pneumonia can also complicate a URI. A URI can worsen asthma and chronic obstructive pulmonary disease (COPD). Sometimes, these complications can require emergency medical care and may be life threatening.  CAUSES Almost all URIs are caused by viruses. A virus is a type of germ and can spread from one person to another.  RISKS FACTORS You may be at risk for a URI if:   You smoke.   You have chronic heart or lung disease.  You have a weakened defense (immune) system.   You are very young or very old.   You have nasal allergies or asthma.  You work in crowded or poorly ventilated areas.  You work in health care facilities or schools. SIGNS AND SYMPTOMS  Symptoms typically develop 2-3 days after you come in contact with a cold virus. Most   viral URIs last 7-10 days. However, viral URIs from the influenza virus (flu virus) can last 14-18 days and are typically more severe. Symptoms may include:   Runny or stuffy (congested) nose.   Sneezing.   Cough.   Sore throat.   Headache.   Fatigue.   Fever.   Loss of appetite.   Pain in your forehead, behind your eyes, and  over your cheekbones (sinus pain).  Muscle aches.  DIAGNOSIS  Your health care provider may diagnose a URI by:  Physical exam.  Tests to check that your symptoms are not due to another condition such as:  Strep throat.  Sinusitis.  Pneumonia.  Asthma. TREATMENT  A URI goes away on its own with time. It cannot be cured with medicines, but medicines may be prescribed or recommended to relieve symptoms. Medicines may help:  Reduce your fever.  Reduce your cough.  Relieve nasal congestion. HOME CARE INSTRUCTIONS   Take medicines only as directed by your health care provider.   Gargle warm saltwater or take cough drops to comfort your throat as directed by your health care provider.  Use a warm mist humidifier or inhale steam from a shower to increase air moisture. This may make it easier to breathe.  Drink enough fluid to keep your urine clear or pale yellow.   Eat soups and other clear broths and maintain good nutrition.   Rest as needed.   Return to work when your temperature has returned to normal or as your health care provider advises. You may need to stay home longer to avoid infecting others. You can also use a face mask and careful hand washing to prevent spread of the virus.  Increase the usage of your inhaler if you have asthma.   Do not use any tobacco products, including cigarettes, chewing tobacco, or electronic cigarettes. If you need help quitting, ask your health care provider. PREVENTION  The best way to protect yourself from getting a cold is to practice good hygiene.   Avoid oral or hand contact with people with cold symptoms.   Wash your hands often if contact occurs.  There is no clear evidence that vitamin C, vitamin E, echinacea, or exercise reduces the chance of developing a cold. However, it is always recommended to get plenty of rest, exercise, and practice good nutrition.  SEEK MEDICAL CARE IF:   You are getting worse rather than  better.   Your symptoms are not controlled by medicine.   You have chills.  You have worsening shortness of breath.  You have brown or red mucus.  You have yellow or brown nasal discharge.  You have pain in your face, especially when you bend forward.  You have a fever.  You have swollen neck glands.  You have pain while swallowing.  You have white areas in the back of your throat. SEEK IMMEDIATE MEDICAL CARE IF:   You have severe or persistent:  Headache.  Ear pain.  Sinus pain.  Chest pain.  You have chronic lung disease and any of the following:  Wheezing.  Prolonged cough.  Coughing up blood.  A change in your usual mucus.  You have a stiff neck.  You have changes in your:  Vision.  Hearing.  Thinking.  Mood. MAKE SURE YOU:   Understand these instructions.  Will watch your condition.  Will get help right away if you are not doing well or get worse.   This information is not intended to replace advice   given to you by your health care provider. Make sure you discuss any questions you have with your health care provider.   Document Released: 06/01/2001 Document Revised: 04/22/2015 Document Reviewed: 03/13/2014 Elsevier Interactive Patient Education 2016 Elsevier Inc[employer]  

## 2015-12-24 ENCOUNTER — Telehealth: Payer: Self-pay | Admitting: *Deleted

## 2015-12-24 NOTE — Telephone Encounter (Signed)
Received PA for dulera. [id number] ID [id number]  Pt was last seen 06/2014 and told to f/u PRN. LMTCB x1 for pt needs to schedule appt or have PCP fill RX.

## 2015-12-29 NOTE — Telephone Encounter (Signed)
LMTCB

## 2015-12-31 NOTE — Telephone Encounter (Signed)
lmtcb X2 for pt.  

## 2016-01-01 NOTE — Telephone Encounter (Signed)
lmtcb x3 for pt. 

## 2016-01-02 ENCOUNTER — Telehealth: Payer: Self-pay | Admitting: *Deleted

## 2016-01-02 NOTE — Telephone Encounter (Signed)
Initiated PA for [facility] thru [place]. [employer] PT [id number]  Will await response.

## 2016-01-05 NOTE — Telephone Encounter (Signed)
symbicort 80 2bid and zero coupon available

## 2016-01-05 NOTE — Telephone Encounter (Signed)
Checked PA and still in process The plan will fax you a determination, typically within 1 to 5 business days

## 2016-01-05 NOTE — Telephone Encounter (Signed)
Received fax that Ruthe Mannan was denied. States pt must try Advair, Breo, and Symbicort.

## 2016-01-05 NOTE — Telephone Encounter (Signed)
LMTBC for pt  

## 2016-01-05 NOTE — Telephone Encounter (Signed)
LMTCB x1 for pt.  

## 2016-01-07 NOTE — Telephone Encounter (Signed)
LVM for patient to return call. 

## 2016-01-08 MED ORDER — BUDESONIDE-FORMOTEROL FUMARATE 80-4.5 MCG/ACT IN AERO: 2.0000 | INHALATION_SPRAY | Freq: Two times a day (BID) | RESPIRATORY_TRACT | Status: DC

## 2016-01-08 NOTE — Telephone Encounter (Signed)
Spoke with pt's wife. She is aware of medication change. Rx has been sent in. Nothing further was needed. 

## 2016-01-10 ENCOUNTER — Ambulatory Visit (INDEPENDENT_AMBULATORY_CARE_PROVIDER_SITE_OTHER): Payer: PRIVATE HEALTH INSURANCE | Admitting: Family Medicine

## 2016-01-10 ENCOUNTER — Other Ambulatory Visit: Payer: Self-pay | Admitting: Family Medicine

## 2016-01-10 ENCOUNTER — Encounter: Payer: Self-pay | Admitting: Family Medicine

## 2016-01-10 VITALS — BP 150/90 | HR 91 | Temp 99.1°F | Resp 20 | Ht 68.0 in | Wt 229.0 lb

## 2016-01-10 DIAGNOSIS — I10 Essential (primary) hypertension: Secondary | ICD-10-CM | POA: Diagnosis not present

## 2016-01-10 DIAGNOSIS — H00013 Hordeolum externum right eye, unspecified eyelid: Secondary | ICD-10-CM

## 2016-01-10 MED ORDER — ERYTHROMYCIN 5 MG/GM OP OINT: 1.0000 "application " | TOPICAL_OINTMENT | Freq: Every day | OPHTHALMIC | Status: DC

## 2016-01-10 NOTE — Progress Notes (Addendum)
HPI:  Acute visit for:  Itchy red eye: -started yesterday -R upper eyelid swollen, mild irritation, some yellow drainage this morning -denies: fevers, chills, pain in eye, blurred vision, trauma to eye  ROS: See pertinent positives and negatives per HPI.  Past Medical History  Diagnosis Date  . Hypertension     h/o LVH, resolved  . Migraines   . Seasonal allergies   . Arrhythmia     PALPITATIONS AND PVC - improved with cutting down on caffeine  . GERD (gastroesophageal reflux disease)   . 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 Estanislado Pandy)  . Tremor   . Hepatic steatosis   . Cough variant asthma 05/28/2014    05/27/2014 p extensive coaching HFA effectiveness =    75% > try dulera 100 2bid  - PFT's 07/03/2014 FEV1 4.15 (98%) ratio 89 and no chang p B2 and nl fef25-75  And nl dlco    . Diverticulosis 07/2015    by CT scan    Past Surgical History  Procedure Laterality Date  . Rotator cuff repair  2009    left  . Other surgical history  1992    facial surgery  . Umbilical hernia repair  05/10/2012    Procedure: HERNIA REPAIR UMBILICAL ADULT;  Surgeon: Harl Bowie, MD;  Location: [facility];  Service: General;  Laterality: N/A;    Family History  Problem Relation Age of Onset  . Asthma Father   . Hypertension Brother     obese  . CAD Paternal Grandfather     MI x2  . Multiple sclerosis Brother   . Arthritis Father     father, brother, Pgrandfather  . Stroke Neg Hx   . Cancer Neg Hx   . Chronic bronchitis Father   . Allergies Father     Social History   Social History  . Marital Status: Married    Spouse Name: N/A  . Number of Children: 2  . Years of Education: N/A   Occupational History  . golf course maintenance    Social History Main Topics  . Smoking status: Former Smoker -- 1.00 packs/day for 13 years    Types: Cigarettes    Quit date: 12/21/1999  . Smokeless tobacco: Former Systems developer    Types: Snuff     Quit date: 12/20/2013  . Alcohol Use: No     Comment: quit 2002  . Drug Use: Yes    Special: Marijuana     Comment: quit 2002  . Sexual Activity: Not Asked   Other Topics Concern  . 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  albuterol (VENTOLIN HFA) 108 (90 BASE) MCG/ACT inhaler, Inhale 2 puffs into the lungs every 4 (four) hours as needed for wheezing or shortness of breath., Disp: 1 Inhaler, Rfl: 6 .  amLODipine (NORVASC) 10 MG tablet, TAKE 1 TABLET (10 MG TOTAL) BY MOUTH DAILY., Disp: 90 tablet, Rfl: 3 .  budesonide-formoterol (SYMBICORT) 80-4.5 MCG/ACT inhaler, Inhale 2 puffs into the lungs 2 (two) times daily., Disp: 1 Inhaler, Rfl: 5 .  cetirizine (ZYRTEC) 10 MG tablet, Take 10 mg by mouth as needed. , Disp: , Rfl:  .  cyclobenzaprine (FLEXERIL)  10 MG tablet, Take 0.5-1 tablets (5-10 mg total) by mouth 3 (three) times daily as needed for muscle spasms., Disp: 40 tablet, Rfl: 0 .  DULERA 100-5 MCG/ACT AERO, TAKE 2 PUFFS FIRST THING IN THE MORNING AND THEN ANOTHER 2 PUFFS ABOUT 12 HOURS LATER, Disp: 13 Inhaler, Rfl: 0 .  esomeprazole (NEXIUM) 40 MG capsule, Take 40 mg by mouth daily at 12 noon., Disp: , Rfl:  .  fluticasone (FLONASE) 50 MCG/ACT nasal spray, Place 2 sprays into both nostrils daily., Disp: 16 g, Rfl: 6 .  metoprolol succinate (TOPROL-XL) 50 MG 24 hr tablet, TAKE 1 TABLET BY MOUTH EVERY DAY WITH OR IMMEDIATLEY FOLLOWING A MEAL, Disp: 30 tablet, Rfl: 11 .  naproxen sodium (ANAPROX) 550 MG tablet, Take 550 mg by mouth daily as needed (pain)., Disp: , Rfl:  .  erythromycin ophthalmic ointment, Place 1 application into the right eye at bedtime., Disp: 3.5 g, Rfl: 0  EXAM:  Filed Vitals:   01/10/16 1026  BP: 150/90  Pulse: 91  Temp: 99.1 F (37.3 C)   Resp: 20    Body mass index is 34.83 kg/(m^2).  GENERAL: vitals reviewed and listed above, alert, oriented, appears well hydrated and in no acute distress  HEENT: atraumatic, conjunttiva mildly erythematous R, sty R upper lid, no discharge, PERRLA, EOMI, visual acuity grossly intact, no obvious abnormalities on inspection of external nose and ears  NECK: no obvious masses on inspection  MS: moves all extremities without noticeable abnormality  PSYCH: pleasant and cooperative, no obvious depression or anxiety  ASSESSMENT AND PLAN:  Discussed the following assessment and plan:  Sty, external, right  Essential hypertension  -with ? Bacterial conjunctivitis per hx and he is very concerned about this as has funeral to attend in several days so along with compresses opted for a few days of qhs erythro oint -his BP is elevated today without any associated symptoms - he reports he ran out of his antihypertensive but agrees to go get refill and restart -Patient advised to return or notify a doctor immediately if symptoms worsen or persist or new concerns arise.  Patient Instructions  Stye A stye is a bump on your eyelid caused by a bacterial infection. A stye can form inside the eyelid (internal stye) or outside the eyelid (external stye). An internal stye may be caused by an infected oil-producing gland inside your eyelid. An external stye may be caused by an infection at the base of your eyelash (hair follicle). Styes are very common. Anyone can get them at any age. They usually occur in just one eye, but you may have more than one in either eye.  CAUSES  The infection is almost always caused by bacteria called Staphylococcus aureus. This is a common type of bacteria that lives on your skin. RISK FACTORS You may be at higher risk for a stye if you have had one before. You may also be at higher risk if you have:  Diabetes.  Long-term illness.  Long-term eye redness.  A skin  condition called seborrhea.  High fat levels in your blood (lipids). SIGNS AND SYMPTOMS  Eyelid pain is the most common symptom of a stye. Internal styes are more painful than external styes. Other signs and symptoms may include:  Painful swelling of your eyelid.  A scratchy feeling in your eye.  Tearing and redness of your eye.  Pus draining from the stye. DIAGNOSIS  Your health care provider may be able to diagnose a stye just  by examining your eye. The health care provider may also check to make sure:  You do not have a fever or other signs of a more serious infection.  The infection has not spread to other parts of your eye or areas around your eye. TREATMENT  Most styes will clear up in a few days without treatment. In some cases, you may need to use antibiotic drops or ointment to prevent infection. Your health care provider may have to drain the stye surgically if your stye is:  Large.  Causing a lot of pain.  Interfering with your vision. This can be done using a thin blade or a needle.  HOME CARE INSTRUCTIONS   Take medicines only as directed by your health care provider.  Apply a clean, warm compress to your eye for 10 minutes, 4 times a day.  Do not wear contact lenses or eye makeup until your stye has healed.  Do not try to pop or drain the stye. SEEK MEDICAL CARE IF:  You have chills or a fever.  Your stye does not go away after several days.  Your stye affects your vision.  Your eyeball becomes swollen, red, or painful. MAKE SURE YOU:  Understand these instructions.  Will watch your condition.  Will get help right away if you are not doing well or get worse.   This information is not intended to replace advice given to you by your health care provider. Make sure you discuss any questions you have with your health care provider.   Document Released: 09/15/2005 Document Revised: 12/27/2014 Document Reviewed: 03/22/2014 Elsevier Interactive Patient  Education 2016 [place], [place]

## 2016-01-10 NOTE — Patient Instructions (Signed)
Stye A stye is a bump on your eyelid caused by a bacterial infection. A stye can form inside the eyelid (internal stye) or outside the eyelid (external stye). An internal stye may be caused by an infected oil-producing gland inside your eyelid. An external stye may be caused by an infection at the base of your eyelash (hair follicle). Styes are very common. Anyone can get them at any age. They usually occur in just one eye, but you may have more than one in either eye.  CAUSES  The infection is almost always caused by bacteria called Staphylococcus aureus. This is a common type of bacteria that lives on your skin. RISK FACTORS You may be at higher risk for a stye if you have had one before. You may also be at higher risk if you have:  Diabetes.  Long-term illness.  Long-term eye redness.  A skin condition called seborrhea.  High fat levels in your blood (lipids). SIGNS AND SYMPTOMS  Eyelid pain is the most common symptom of a stye. Internal styes are more painful than external styes. Other signs and symptoms may include:  Painful swelling of your eyelid.  A scratchy feeling in your eye.  Tearing and redness of your eye.  Pus draining from the stye. DIAGNOSIS  Your health care provider may be able to diagnose a stye just by examining your eye. The health care provider may also check to make sure:  You do not have a fever or other signs of a more serious infection.  The infection has not spread to other parts of your eye or areas around your eye. TREATMENT  Most styes will clear up in a few days without treatment. In some cases, you may need to use antibiotic drops or ointment to prevent infection. Your health care provider may have to drain the stye surgically if your stye is:  Large.  Causing a lot of pain.  Interfering with your vision. This can be done using a thin blade or a needle.  HOME CARE INSTRUCTIONS   Take medicines only as directed by your health care  provider.  Apply a clean, warm compress to your eye for 10 minutes, 4 times a day.  Do not wear contact lenses or eye makeup until your stye has healed.  Do not try to pop or drain the stye. SEEK MEDICAL CARE IF:  You have chills or a fever.  Your stye does not go away after several days.  Your stye affects your vision.  Your eyeball becomes swollen, red, or painful. MAKE SURE YOU:  Understand these instructions.  Will watch your condition.  Will get help right away if you are not doing well or get worse.   This information is not intended to replace advice given to you by your health care provider. Make sure you discuss any questions you have with your health care provider.   Document Released: 09/15/2005 Document Revised: 12/27/2014 Document Reviewed: 03/22/2014 Elsevier Interactive Patient Education 2016 Elsevier Inc.  

## 2016-01-10 NOTE — Progress Notes (Signed)
Pre visit review using our clinic review tool, if applicable. No additional management support is needed unless otherwise documented below in the visit note. 

## 2016-01-11 ENCOUNTER — Ambulatory Visit (INDEPENDENT_AMBULATORY_CARE_PROVIDER_SITE_OTHER): Payer: PRIVATE HEALTH INSURANCE | Admitting: Family Medicine

## 2016-01-11 VITALS — BP 132/82 | HR 112 | Temp 98.4°F | Resp 16 | Ht 71.0 in | Wt 232.0 lb

## 2016-01-11 DIAGNOSIS — H00023 Hordeolum internum right eye, unspecified eyelid: Secondary | ICD-10-CM | POA: Diagnosis not present

## 2016-01-11 DIAGNOSIS — R002 Palpitations: Secondary | ICD-10-CM | POA: Diagnosis not present

## 2016-01-11 DIAGNOSIS — I10 Essential (primary) hypertension: Secondary | ICD-10-CM | POA: Diagnosis not present

## 2016-01-11 MED ORDER — METOPROLOL SUCCINATE ER 50 MG PO TB24: ORAL_TABLET | ORAL | Status: DC

## 2016-01-11 NOTE — Patient Instructions (Addendum)
Your eyelid swelling appears to be due to an internal stye of your right upper eyelid. Okay to continue the erythromycin ointment up to 3 times per day, 1/2 inch ribbon each time. Warm compresses, and other treatment as below. If swelling or redness extends beyond the eye lid, fevers, or any worsening symptoms, return here for recheck or your primary care provider.  I refilled your toprol temporarily, as coming off this may be reason heart beating harder today, but will need to follow up with your primary provider for further refills. If any chest pain, pressure or shortness of breath returns - call 911 or go to emergency room.  Stress management and relaxation techniques as discussed, especially during difficult times currently.  Let us know if we can help further.  Stye A stye is a bump on your eyelid caused by a bacterial infection. A stye can form inside the eyelid (internal stye) or outside the eyelid (external stye). An internal stye may be caused by an infected oil-producing gland inside your eyelid. An external stye may be caused by an infection at the base of your eyelash (hair follicle). Styes are very common. Anyone can get them at any age. They usually occur in just one eye, but you may have more than one in either eye.  CAUSES  The infection is almost always caused by bacteria called Staphylococcus aureus. This is a common type of bacteria that lives on your skin. RISK FACTORS You may be at higher risk for a stye if you have had one before. You may also be at higher risk if you have:  Diabetes.  Long-term illness.  Long-term eye redness.  A skin condition called seborrhea.  High fat levels in your blood (lipids). SIGNS AND SYMPTOMS  Eyelid pain is the most common symptom of a stye. Internal styes are more painful than external styes. Other signs and symptoms may include:  Painful swelling of your eyelid.  A scratchy feeling in your eye.  Tearing and redness of your  eye.  Pus draining from the stye. DIAGNOSIS  Your health care provider may be able to diagnose a stye just by examining your eye. The health care provider may also check to make sure:  You do not have a fever or other signs of a more serious infection.  The infection has not spread to other parts of your eye or areas around your eye. TREATMENT  Most styes will clear up in a few days without treatment. In some cases, you may need to use antibiotic drops or ointment to prevent infection. Your health care provider may have to drain the stye surgically if your stye is:  Large.  Causing a lot of pain.  Interfering with your vision. This can be done using a thin blade or a needle.  HOME CARE INSTRUCTIONS   Take medicines only as directed by your health care provider.  Apply a clean, warm compress to your eye for 10 minutes, 4 times a day.  Do not wear contact lenses or eye makeup until your stye has healed.  Do not try to pop or drain the stye. SEEK MEDICAL CARE IF:  You have chills or a fever.  Your stye does not go away after several days.  Your stye affects your vision.  Your eyeball becomes swollen, red, or painful. MAKE SURE YOU:  Understand these instructions.  Will watch your condition.  Will get help right away if you are not doing well or get worse.  This information is not intended to replace advice given to you by your health care provider. Make sure you discuss any questions you have with your health care provider.   Document Released: 09/15/2005 Document Revised: 12/27/2014 Document Reviewed: 03/22/2014 Elsevier Interactive Patient Education [employer].

## 2016-01-11 NOTE — Progress Notes (Signed)
Subjective:  This chart was scribed for Merri Ray, MD by [facility], medical scribe at [facility].The patient was seen in exam room 01 and the patient's care was started at 2:40 PM.   Patient ID: Jordan Buchanan, male    DOB: 24-Apr-1971, 45 y.o.   MRN: [id number] Chief Complaint  Patient presents with  . Eye Problem    rt. eyelids swollen   . Facial Swelling    right side   . Medication Refill    TOPROL 50 MG 24 hr tablet    HPI  HPI Comments: Jordan Buchanan is a 45 y.o. male who presents to [facility] complaining of right upper eye lid swelling since yesterday morning. The eye is also producing a white discharge. Seen yesterday at [facility] and given erythromycin ointment.Also doing warm compresses. He has used this once last night. Then he woke up this morning and the right side of face was swollen and his eye was producing more discharge. The swelling in his face has improved. No tongue swelling.  Also having some SOB, congestion and heart palpations. He says he has been having an anxious feeling but very stressed recently due to the passing of his grandfather. Not having chest pain. He has been out of his metoprolol. He denies chest pain, chest pressure and no history of heart failure.  Patient Active Problem List   Diagnosis Date Noted  . Acute upper respiratory infection 12/23/2015  . LLQ abdominal pain 07/31/2015  . Daytime somnolence 07/31/2015  . Neck pain on right side 01/13/2015  . Cough variant asthma 05/28/2014  . Pruritic condition 04/18/2014  . Right low back pain 09/18/2013  . Hepatic steatosis   . Prostatitis 05/09/2013  . Migraines   . Seasonal allergies   . GERD (gastroesophageal reflux disease)   . Arthritis   . Hypertension    Past Medical History  Diagnosis Date  . Hypertension     h/o LVH, resolved  . Migraines   . Seasonal allergies   . Arrhythmia     PALPITATIONS AND PVC - improved with  cutting down on caffeine  . GERD (gastroesophageal reflux disease)   . 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 Estanislado Pandy)  . Tremor   . Hepatic steatosis   . Cough variant asthma 05/28/2014    05/27/2014 p extensive coaching HFA effectiveness =    75% > try dulera 100 2bid  - PFT's 07/03/2014 FEV1 4.15 (98%) ratio 89 and no chang p B2 and nl fef25-75  And nl dlco    . Diverticulosis 07/2015    by CT scan   Past Surgical History  Procedure Laterality Date  . Rotator cuff repair  2009    left  . Other surgical history  1992    facial surgery  . Umbilical hernia repair  05/10/2012    Procedure: HERNIA REPAIR UMBILICAL ADULT;  Surgeon: Harl Bowie, MD;  Location: [place];  Service: General;  Laterality: N/A;   No Known Allergies Prior to Admission medications   Medication Sig Start Date End Date Taking? Authorizing Provider  albuterol (VENTOLIN HFA) 108 (90 BASE) MCG/ACT inhaler Inhale 2 puffs into the lungs every 4 (four) hours as needed for wheezing or shortness of breath. 06/03/14  Yes Tanda Rockers, MD  amLODipine (NORVASC) 10 MG tablet TAKE 1 TABLET (10 MG TOTAL) BY MOUTH DAILY. 07/31/15  Yes Ria Bush, MD  budesonide-formoterol [place]) 80-4.5 MCG/ACT inhaler Inhale 2 puffs into the lungs 2 (two) times daily. 01/08/16  Yes Tanda Rockers, MD  cetirizine (ZYRTEC) 10 MG tablet Take 10 mg by mouth as needed.    Yes Historical Provider, MD  erythromycin ophthalmic ointment Place 1 application into the right eye at bedtime. 01/10/16  Yes Lucretia Kern, DO  esomeprazole (NEXIUM) 40 MG capsule Take 40 mg by mouth daily at 12 noon.   Yes Historical Provider, MD  fluticasone (FLONASE) 50 MCG/ACT nasal spray Place 2 sprays into both nostrils daily. 05/13/15  Yes Ria Bush, MD  metoprolol succinate (TOPROL-XL) 50 MG 24 hr tablet TAKE 1 TABLET BY MOUTH EVERY DAY WITH OR IMMEDIATLEY FOLLOWING A MEAL 12/21/14  Yes Ria Bush, MD    naproxen sodium (ANAPROX) 550 MG tablet Take 550 mg by mouth daily as needed (pain).   Yes Historical Provider, MD   Social History   Social History  . Marital Status: Married    Spouse Name: N/A  . Number of Children: 2  . Years of Education: N/A   Occupational History  . golf course maintenance    Social History Main Topics  . Smoking status: Former Smoker -- 1.00 packs/day for 13 years    Types: Cigarettes    Quit date: 12/21/1999  . Smokeless tobacco: Former Systems developer    Types: Snuff    Quit date: 12/20/2013  . Alcohol Use: No     Comment: quit 2002  . Drug Use: Yes    Special: Marijuana     Comment: quit 2002  . Sexual Activity: Not on file   Other Topics Concern  . 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   Eyes: Positive for discharge.  Respiratory: Positive for shortness of breath.   Cardiovascular: Positive for palpitations. Negative for chest pain.      Objective:  BP 132/82 mmHg  Pulse 112  Temp(Src) 98.4 F (36.9 C) (Oral)  Resp 16  Ht _0  (1.803 m)  Wt 232 lb (105.235 kg)  BMI 32.37 kg/m2  SpO2 98% Physical Exam  Constitutional: He is oriented to person, place, and time. He appears well-developed and well-nourished. No distress.  HENT:  Head: Normocephalic and atraumatic.  Eyes: EOM are normal. Pupils are equal, round, and reactive to light. Right conjunctiva is not injected. No scleral icterus.  Soft tissue swelling and erythema of the right upper eyelid. Two internal styes. Erythema and welling does not appear above the upper eyelid. Orbital area is normal. Anterior chamber is clear.    Neck: Normal range of motion.  Cardiovascular: Normal rate and regular rhythm.   Pulmonary/Chest: Effort  normal. No respiratory distress.  Musculoskeletal: Normal range of motion.  Neurological: He is alert and oriented to person, place, and time.  Skin: Skin is warm and dry.  Psychiatric: He has a normal mood and affect. His behavior is normal.  Nursing note and vitals reviewed.   Visual Acuity Screening   Right eye Left eye Both eyes  Without correction:     With correction: _1       Assessment & Plan:   Jordan Buchanan is  a 45 y.o. male Essential hypertension - Plan: metoprolol succinate (TOPROL-XL) 50 MG 24 hr tablet Palpitations - Plan: metoprolol succinate (TOPROL-XL) 50 MG 24 hr tablet  -Suspect palpitations since out of Toprol for 2 days, along with situational stressors with death in family. Stress and relaxation techniques discussed, RTC/ER precautions if any shortness of breath, chest pain or persistent palpitations.  Internal hordeolum of right eye  -No facial swelling seen at this time. Minimal erythema/edema of medial upper lid in area of internal hordeolum, no apparent preseptal cellulitis signs. Discussed continuing warm compresses, can increase erythromycin ointment to 3 times a day, RTC precautions.  Meds ordered this encounter  Medications  . metoprolol succinate (TOPROL-XL) 50 MG 24 hr tablet    Sig: TAKE 1 TABLET BY MOUTH EVERY DAY WITH OR IMMEDIATLEY FOLLOWING A MEAL    Dispense:  30 tablet    Refill:  0   Patient Instructions  Your eyelid swelling appears to be due to an internal stye of your right upper eyelid. Okay to continue the erythromycin ointment up to 3 times per day, 1/2 inch ribbon each time. Warm compresses, and other treatment as below. If swelling or redness extends beyond the eye lid, fevers, or any worsening symptoms, return here for recheck or your primary care provider.  I refilled your toprol temporarily, as coming off this may be reason heart beating harder today, but will need to follow up with your primary provider for further  refills. If any chest pain, pressure or shortness of breath returns - call 911 or go to emergency room.  Stress management and relaxation techniques as discussed, especially during difficult times currently.  Let us know if we can help further.  Stye A stye is a bump on your eyelid caused by a bacterial infection. A stye can form inside the eyelid (internal stye) or outside the eyelid (external stye). An internal stye may be caused by an infected oil-producing gland inside your eyelid. An external stye may be caused by an infection at the base of your eyelash (hair follicle). Styes are very common. Anyone can get them at any age. They usually occur in just one eye, but you may have more than one in either eye.  CAUSES  The infection is almost always caused by bacteria called Staphylococcus aureus. This is a common type of bacteria that lives on your skin. RISK FACTORS You may be at higher risk for a stye if you have had one before. You may also be at higher risk if you have:  Diabetes.  Long-term illness.  Long-term eye redness.  A skin condition called seborrhea.  High fat levels in your blood (lipids). SIGNS AND SYMPTOMS  Eyelid pain is the most common symptom of a stye. Internal styes are more painful than external styes. Other signs and symptoms may include:  Painful swelling of your eyelid.  A scratchy feeling in your eye.  Tearing and redness of your eye.  Pus draining from the stye. DIAGNOSIS  Your health care provider may be able to diagnose a stye just by examining your eye. The health care provider may also check to make sure:  You do not have a fever or other signs of a more serious infection.  The infection has not spread to other parts of your eye or areas around your eye. TREATMENT  Most styes will clear up in a few days without treatment. In some cases, you may need to use antibiotic drops or ointment to prevent infection.  Your health care provider may have to  drain the stye surgically if your stye is:  Large.  Causing a lot of pain.  Interfering with your vision. This can be done using a thin blade or a needle.  HOME CARE INSTRUCTIONS   Take medicines only as directed by your health care provider.  Apply a clean, warm compress to your eye for 10 minutes, 4 times a day.  Do not wear contact lenses or eye makeup until your stye has healed.  Do not try to pop or drain the stye. SEEK MEDICAL CARE IF:  You have chills or a fever.  Your stye does not go away after several days.  Your stye affects your vision.  Your eyeball becomes swollen, red, or painful. MAKE SURE YOU:  Understand these instructions.  Will watch your condition.  Will get help right away if you are not doing well or get worse.   This information is not intended to replace advice given to you by your health care provider. Make sure you discuss any questions you have with your health care provider.   Document Released: 09/15/2005 Document Revised: 12/27/2014 Document Reviewed: 03/22/2014 Elsevier Interactive Patient Education [employer].         By signing my name below, I, Nadim Abuhashem, attest that this documentation has been prepared under the direction and in the presence of Merri Ray, MD.  Electronically Signed: Lora Havens, medical scribe. 01/11/2016, 2:53 PM.

## 2016-01-12 ENCOUNTER — Telehealth: Payer: Self-pay

## 2016-01-12 NOTE — Telephone Encounter (Signed)
Noted. Seen by [facility].Jordan Buchanan recently passed.

## 2016-01-12 NOTE — Telephone Encounter (Signed)
Per chart review pt seen [facility] on 01/11/16.

## 2016-01-12 NOTE — Telephone Encounter (Signed)
PLEASE NOTE: All timestamps contained within this report are represented as [place] Standard Time. CONFIDENTIALTY NOTICE: This fax transmission is intended only for the addressee. It contains information that is legally privileged, confidential or otherwise protected from use or disclosure. If you are not the intended recipient, you are strictly prohibited from reviewing, disclosing, copying using or disseminating any of this information or taking any action in reliance on or regarding this information. If you have received this fax in error, please notify us immediately by telephone so that we can arrange for its return to [place]. Phone: [phone], Toll-Free: [phone], Fax: [phone] Page: 1 of 2 Call Id: [id number] [place] Patient Name: Jordan Buchanan Gender: Male DOB: 22-May-1971 Age: 45 Y 2 M 15 D Return Phone Number: [phone] (Secondary), [phone] (Alternate) Address: declined City/State/Zip: [place] Client [place] [facility] - Client Client Site [place] Physician Ria Bush Contact Type Call Call Type Triage / Clinical Relationship To Patient Self Return Phone Number [phone] (Secondary) Chief Complaint Facial Swelling Initial Comment Caller states [facility], thought it was a sty, gave him abx cream now this morning whole side of face swollen PreDisposition Did not know what to do Nurse Assessment Nurse: Dimas Chyle, RN, Dellis Filbert Date/Time Eilene Ghazi Time): 01/11/2016 9:02:49 AM Confirm and document reason for call. If symptomatic, describe symptoms. You must click the next button to save text entered. ---Caller states went [facility] eye, thought it was a sty, gave him abx cream now this morning whole side of face swollen. Eye symptoms started yesterday. Has the  patient traveled out of the country within the last 30 days? ---No Does the patient have any new or worsening symptoms? ---Yes Will a triage be completed? ---Yes Related visit to physician within the last 2 weeks? ---Yes Does the PT have any chronic conditions? (i.e. diabetes, asthma, etc.) ---Yes List chronic conditions. ---HTN Is this a behavioral health or substance abuse call? ---No Guidelines Guideline Title Affirmed Question Affirmed Notes Nurse Date/Time Eilene Ghazi Time) Face Swelling Swelling is painful to touch Terrence Dupont 01/11/2016 9:03:35 AM Disp. Time Eilene Ghazi Time) Disposition Final User 01/11/2016 9:06:45 AM See Physician within 24 Hours Yes Dimas Chyle, RN, Guerry Minors Understands: Yes Disagree/Comply: Comply PLEASE NOTE: All timestamps contained within this report are represented as [place] Standard Time. CONFIDENTIALTY NOTICE: This fax transmission is intended only for the addressee. It contains information that is legally privileged, confidential or otherwise protected from use or disclosure. If you are not the intended recipient, you are strictly prohibited from reviewing, disclosing, copying using or disseminating any of this information or taking any action in reliance on or regarding this information. If you have received this fax in error, please notify us immediately by telephone so that we can arrange for its return to [place]. Phone: [phone], Toll-Free: [phone], Fax: [phone] Page: 2 of 2 Call Id: [id number] Care Advice Given Per Guideline SEE PHYSICIAN WITHIN 24 HOURS: * IF OFFICE WILL BE OPEN: You need to be seen within the next 24 hours. Call your doctor when the office opens, and make an appointment. PAIN MEDICINES: * For pain relief, take acetaminophen, ibuprofen, or naproxen. * Use the lowest amount that makes your pain feel better. ACETAMINOPHEN (E.G., TYLENOL): * Take 650 mg (two 325 mg pills) by mouth every 4-6 hours as needed. Each  Regular Strength Tylenol pill has  325 mg of acetaminophen. The most you should take each day is 3,250 mg (10 Regular Strength pills a day). * Another choice is to take 1,000 mg (two 500 mg pills) every 8 hours as needed. Each Extra Strength Tylenol pill has 500 mg of acetaminophen. The most you should take each day is 3,000 mg (6 Extra Strength pills a day). IBUPROFEN (E.G., MOTRIN, ADVIL): * Take 400 mg (two 200 mg pills) by mouth every 6 hours as needed. * Another choice is to take 600 mg (three 200 mg pills) by mouth every 8 hours as needed. * The most you should take each day is 1,200 mg (six 200 mg pills a day), unless your doctor has told you to take more. CALL BACK IF: * You become worse. CARE ADVICE given per Face Swelling (Adult) guideline. CAUTION - NSAIDS (E.G., IBUPROFEN, NAPROXEN): * Do not take nonsteroidal anti-inflammatory drugs (NSAIDs) if you have stomach problems, kidney disease, heart failure, or other contraindications to using this type of medication. * Do not take NSAID medications for over 7 days without consulting your PCP. * Do not take NSAID medications if you are pregnant. * You may take this medicine with or without food. Taking it with food or milk may lessen the chance the drug will upset your stomach. * GASTROINTESTINAL RISK: There is an increased risk of stomach ulcers, GI bleeding, perforation. * CARDIOVASCULAR RISK: There may be an increased risk of heart attack and stroke. After Care Instructions Given Call Event Type User Date / Time Description Referrals [facility]

## 2016-02-13 ENCOUNTER — Other Ambulatory Visit: Payer: Self-pay | Admitting: Family Medicine

## 2016-02-15 ENCOUNTER — Other Ambulatory Visit: Payer: Self-pay | Admitting: Family Medicine

## 2016-02-29 ENCOUNTER — Other Ambulatory Visit: Payer: Self-pay | Admitting: Physician Assistant

## 2016-03-03 ENCOUNTER — Ambulatory Visit (INDEPENDENT_AMBULATORY_CARE_PROVIDER_SITE_OTHER): Payer: PRIVATE HEALTH INSURANCE | Admitting: Family Medicine

## 2016-03-03 ENCOUNTER — Encounter: Payer: Self-pay | Admitting: Family Medicine

## 2016-03-03 VITALS — BP 124/74 | HR 88 | Temp 97.4°F | Wt 230.0 lb

## 2016-03-03 DIAGNOSIS — J069 Acute upper respiratory infection, unspecified: Secondary | ICD-10-CM | POA: Diagnosis not present

## 2016-03-03 DIAGNOSIS — J309 Allergic rhinitis, unspecified: Secondary | ICD-10-CM | POA: Diagnosis not present

## 2016-03-03 DIAGNOSIS — H00029 Hordeolum internum unspecified eye, unspecified eyelid: Secondary | ICD-10-CM | POA: Insufficient documentation

## 2016-03-03 DIAGNOSIS — I10 Essential (primary) hypertension: Secondary | ICD-10-CM

## 2016-03-03 DIAGNOSIS — J45991 Cough variant asthma: Secondary | ICD-10-CM | POA: Diagnosis not present

## 2016-03-03 DIAGNOSIS — H00026 Hordeolum internum left eye, unspecified eyelid: Secondary | ICD-10-CM

## 2016-03-03 MED ORDER — GUAIFENESIN-CODEINE 100-10 MG/5ML PO SYRP: 5.0000 mL | ORAL_SOLUTION | Freq: Three times a day (TID) | ORAL | Status: DC | PRN

## 2016-03-03 MED ORDER — METOPROLOL SUCCINATE ER 50 MG PO TB24: 50.0000 mg | ORAL_TABLET | Freq: Every day | ORAL | Status: DC

## 2016-03-03 MED ORDER — AMLODIPINE BESYLATE 10 MG PO TABS: ORAL_TABLET | ORAL | Status: DC

## 2016-03-03 MED ORDER — ESOMEPRAZOLE MAGNESIUM 40 MG PO CPDR: 40.0000 mg | DELAYED_RELEASE_CAPSULE | Freq: Every day | ORAL | Status: DC

## 2016-03-03 MED ORDER — AZITHROMYCIN 250 MG PO TABS: ORAL_TABLET | ORAL | Status: DC

## 2016-03-03 MED ORDER — ALBUTEROL SULFATE HFA 108 (90 BASE) MCG/ACT IN AERS: 2.0000 | INHALATION_SPRAY | RESPIRATORY_TRACT | Status: DC | PRN

## 2016-03-03 MED ORDER — FLUTICASONE PROPIONATE 50 MCG/ACT NA SUSP: 2.0000 | Freq: Every day | NASAL | Status: DC

## 2016-03-03 NOTE — Assessment & Plan Note (Signed)
Reassurance provided. Discussed supportive care. Push fluids and rest.  cheratussin codeine cough syrup to fill if worsening night time cough. zpack to fill if red flags of worsening productive cough, fever >101.  Pt agrees with plan.

## 2016-03-03 NOTE — Progress Notes (Signed)
BP 124/74 mmHg  Pulse 88  Temp(Src) 97.4 F (36.3 C) (Oral)  Wt 230 lb (104.327 kg)  SpO2 98%   CC: med refill visit  Subjective:    Patient ID: Jordan Buchanan, male    DOB: July 16, 1971, 45 y.o.   MRN: [id number]  HPI: Hemanth Mascioli is a 45 y.o. male presenting on 03/03/2016 for Hypertension and URI   Recent eval at [facility] for palpitations when ran out of toprol xl + increased stress - grandfather passed away earlier this year.   HTN - Compliant with current antihypertensive regimen of amlodipine 10mg  daily, toprol xl 50mg  daily. Does not check blood pressures at home. No low blood pressure readings or symptoms of dizziness/syncope. Denies HA, vision changes, CP/tightness, SOB, leg swelling.   Also with URI sxs over last 5-6 days - increased fatigue over last week. Sinus and chest congestion, swelling of throat, mild cough. No fevers/chills, dyspnea. Treating with mucinex. + sick contacts at home. H/o cough variant asthma. Taking symbicort regularly. Out of albuterol. Sleeping ok. Stye on left eye   Relevant past medical, surgical, family and social history reviewed and updated as indicated. Interim medical history since our last visit reviewed. Allergies and medications reviewed and updated. Current Outpatient Prescriptions on File Prior to Visit  Medication Sig  . budesonide-formoterol (SYMBICORT) 80-4.5 MCG/ACT inhaler Inhale 2 puffs into the lungs 2 (two) times daily.  . cetirizine (ZYRTEC) 10 MG tablet Take 10 mg by mouth as needed.   . naproxen sodium (ANAPROX) 550 MG tablet Take 550 mg by mouth daily as needed (pain).   No current facility-administered medications on file prior to visit.    Review of Systems Per HPI unless specifically indicated in ROS section     Objective:    BP 124/74 mmHg  Pulse 88  Temp(Src) 97.4 F (36.3 C) (Oral)  Wt 230 lb (104.327 kg)  SpO2 98%  Wt Readings from Last 3 Encounters:  03/03/16 230 lb (104.327 kg)  01/11/16 232 lb (105.235 kg)    01/10/16 229 lb (103.874 kg)    Physical Exam  Constitutional: He appears well-developed and well-nourished. No distress.  HENT:  Head: Normocephalic and atraumatic.  Right Ear: Hearing, tympanic membrane, external ear and ear canal normal.  Left Ear: Hearing, tympanic membrane, external ear and ear canal normal.  Nose: Mucosal edema and rhinorrhea present. Right sinus exhibits no maxillary sinus tenderness and no frontal sinus tenderness. Left sinus exhibits no maxillary sinus tenderness and no frontal sinus tenderness.  Mouth/Throat: Uvula is midline and mucous membranes are normal. Posterior oropharyngeal edema and posterior oropharyngeal erythema present. No oropharyngeal exudate or tonsillar abscesses.  Eyes: Conjunctivae and EOM are normal. Pupils are equal, round, and reactive to light. No scleral icterus.  L lower eyelid with stye present, with palpebral conjunctival injection  Neck: Normal range of motion. Neck supple.  Cardiovascular: Normal rate, regular rhythm, normal heart sounds and intact distal pulses.   No murmur heard. Pulmonary/Chest: Effort normal and breath sounds normal. No respiratory distress. He has no wheezes. He has no rales.  Lymphadenopathy:    He has no cervical adenopathy.  Skin: Skin is warm and dry. No rash noted.  Nursing note and vitals reviewed.     Assessment & Plan:   Problem List Items Addressed This Visit    Sty, internal    Discussed supportive care.      Hypertension    Chronic, stable on current regimen. Continue. Refilled today.  Relevant Medications   amLODipine (NORVASC) 10 MG tablet   metoprolol succinate (TOPROL-XL) 50 MG 24 hr tablet   Cough variant asthma    rec restart albuterol inh PRN.      Relevant Medications   albuterol (VENTOLIN HFA) 108 (90 Base) MCG/ACT inhaler   Acute upper respiratory infection - Primary    Reassurance provided. Discussed supportive care. Push fluids and rest.  cheratussin codeine cough  syrup to fill if worsening night time cough. zpack to fill if red flags of worsening productive cough, fever >101.  Pt agrees with plan.      Relevant Medications   azithromycin (ZITHROMAX) 250 MG tablet    Other Visit Diagnoses    Allergic rhinitis, unspecified allergic rhinitis type        Relevant Medications    fluticasone (FLONASE) 50 MCG/ACT nasal spray        Follow up plan: Return if symptoms worsen or fail to improve.

## 2016-03-03 NOTE — Patient Instructions (Signed)
You have a viral upper respiratory infection. Antibiotics are not needed for this.  Viral infections usually take 7-10 days to resolve.  The cough can last a few weeks to go away. Restart albuterol as needed for cough or wheezing. Use medication as prescribed: codeine cough syrup if needed. Push fluids and plenty of rest. Continue mucinex with large glass of water to help mobilize mucous. If fever >101, worsening productive cough or chest congestion, or ongoing symptoms past 10 days, fill antibiotic provided today. Call clinic with questions.  Good to see you today. I hope you start feeling better soon. Blood pressure medicines refilled.

## 2016-03-03 NOTE — Assessment & Plan Note (Signed)
rec restart albuterol inh PRN.

## 2016-03-03 NOTE — Progress Notes (Signed)
Pre visit review using our clinic review tool, if applicable. No additional management support is needed unless otherwise documented below in the visit note. 

## 2016-03-03 NOTE — Assessment & Plan Note (Signed)
Chronic, stable on current regimen. Continue. Refilled today.

## 2016-03-03 NOTE — Assessment & Plan Note (Signed)
Discussed supportive care

## 2016-03-10 ENCOUNTER — Telehealth: Payer: Self-pay

## 2016-03-10 ENCOUNTER — Ambulatory Visit (INDEPENDENT_AMBULATORY_CARE_PROVIDER_SITE_OTHER): Payer: PRIVATE HEALTH INSURANCE | Admitting: Family Medicine

## 2016-03-10 ENCOUNTER — Encounter: Payer: Self-pay | Admitting: Family Medicine

## 2016-03-10 VITALS — BP 132/90 | HR 76 | Temp 97.8°F | Ht 71.0 in | Wt 233.6 lb

## 2016-03-10 DIAGNOSIS — R058 Other specified cough: Secondary | ICD-10-CM

## 2016-03-10 DIAGNOSIS — R05 Cough: Secondary | ICD-10-CM

## 2016-03-10 DIAGNOSIS — R053 Chronic cough: Secondary | ICD-10-CM | POA: Insufficient documentation

## 2016-03-10 MED ORDER — PREDNISONE 50 MG PO TABS: ORAL_TABLET | ORAL | Status: DC

## 2016-03-10 MED ORDER — GUAIFENESIN-CODEINE 100-10 MG/5ML PO SYRP: 5.0000 mL | ORAL_SOLUTION | Freq: Three times a day (TID) | ORAL | Status: DC | PRN

## 2016-03-10 NOTE — Telephone Encounter (Signed)
Pt was seen 03/10/16; pt finishing abx today and generally feels better but pt still has non prod cough and when coughs hurts in chest.pt has one more does of cherratussin AC. No fever and chest congestion is a lot better. Pt request appt. No available appts at [facility] and pt scheduled to see Dr Thersa Salt at [facility] today at 1:15.

## 2016-03-10 NOTE — Assessment & Plan Note (Addendum)
Patient with recent URI and cough, now with worsening cough (established problem, worsening). Treating with prednisone and Cheratussin refilled. No indication for antibiotics.

## 2016-03-10 NOTE — Patient Instructions (Signed)
Take the prednisone as prescribed.  Take the cough medication as needed.  This will slowly improve.  Contact your PCP if you fail to improve or worsen.  Take care  Dr. Lacinda Axon

## 2016-03-10 NOTE — Progress Notes (Signed)
Subjective:  Patient ID: Jordan Buchanan, male    DOB: 14-Dec-1971  Age: 44 y.o. MRN: [id number]  CC: Cough  HPI:  45 year old male presents to clinic today for an acute visit with complaints of cough.  Patient was recent seen by his PCP on 3/15 it was diagnosed with a respiratory infection.  Since that time he's had worsening of his cough. Cough is currently severe and nonproductive. He's been using Mucinex and Robitussin as well as some cough drops with no improvement. No associated fevers or chills. He has a known sick contacts as this illness is making its way around his household. He has been taking the Cheratussin with some improvement in his cough. He is currently almost out of this. No other complaints at this time.  Social Hx   Social History   Social History  . Marital Status: Married    Spouse Name: N/A  . Number of Children: 2  . Years of Education: N/A   Occupational History  . golf course maintenance    Social History Main Topics  . Smoking status: Former Smoker -- 1.00 packs/day for 13 years    Types: Cigarettes    Quit date: 12/21/1999  . Smokeless tobacco: Former Systems developer    Types: Snuff    Quit date: 12/20/2013  . Alcohol Use: No     Comment: quit 2002  . Drug Use: Yes    Special: Marijuana     Comment: quit 2002  . Sexual Activity: Not Asked   Other Topics Concern  . 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  Respiratory: Positive for cough.    Objective:  BP 132/90 mmHg  Pulse 76  Temp(Src) 97.8 F (36.6 C) (Oral)  Ht 5\' 11"  (1.803 m)  Wt 233 lb 9.6 oz (105.96 kg)  BMI 32.59 kg/m2  SpO2 97%  BP/Weight 03/10/2016 03/03/2016 [id number]  Systolic BP [id number] [id number]  Diastolic BP 90 74 82  Wt. (Lbs) 233.6 230 232  BMI  32.59 32.09 32.37   Physical Exam  Constitutional: He is oriented to person, place, and time. He appears well-developed. No distress.  HENT:  Head: Normocephalic and atraumatic.  Mouth/Throat: Oropharynx is clear and moist.  Normal TM's bilaterally.   Eyes: Conjunctivae are normal.  Cardiovascular: Normal rate and regular rhythm.   No murmur heard. Pulmonary/Chest: Effort normal and breath sounds normal. No respiratory distress. He has no wheezes. He has no rales.  Neurological: He is alert and oriented to person, place, and time.  Psychiatric: He has a normal mood and affect.  Vitals reviewed.  Lab Results  Component Value Date   WBC 8.6 08/03/2015   HGB 15.7 08/03/2015   HCT 44.9 08/03/2015   PLT 249 08/03/2015   GLUCOSE 94 08/03/2015   ALT 26 08/03/2015   AST 24 08/03/2015   NA 139 08/03/2015   K 3.9 08/03/2015   CL 107 08/03/2015   CREATININE 0.86 08/03/2015   BUN 9 08/03/2015   CO2 25 08/03/2015   TSH 0.49 04/18/2014   Assessment & Plan:   Problem List Items Addressed This Visit    Post-viral cough syndrome - Primary    Patient with recent URI and cough, now with worsening cough.  This is post viral in origin. Treating with prednisone and Cheratussin refilled. No indication for antibiotics.         Meds ordered this encounter  Medications  . guaiFENesin-codeine (CHERATUSSIN AC) 100-10 MG/5ML syrup    Sig: Take 5 mLs by mouth 3 (three) times daily as needed for cough (sedation precautions).    Dispense:  140 mL    Refill:  0  . predniSONE (DELTASONE) 50 MG tablet    Sig: 1 tablet daily x 5 days.    Dispense:  5 tablet    Refill:  0    Follow-up: PRN  [place]

## 2016-04-01 ENCOUNTER — Encounter: Payer: Self-pay | Admitting: Family Medicine

## 2016-04-01 ENCOUNTER — Ambulatory Visit (INDEPENDENT_AMBULATORY_CARE_PROVIDER_SITE_OTHER): Payer: PRIVATE HEALTH INSURANCE | Admitting: Family Medicine

## 2016-04-01 VITALS — BP 130/86 | HR 66 | Temp 97.5°F | Wt 231.5 lb

## 2016-04-01 DIAGNOSIS — R05 Cough: Secondary | ICD-10-CM | POA: Diagnosis not present

## 2016-04-01 DIAGNOSIS — R0789 Other chest pain: Secondary | ICD-10-CM | POA: Diagnosis not present

## 2016-04-01 DIAGNOSIS — J45991 Cough variant asthma: Secondary | ICD-10-CM

## 2016-04-01 DIAGNOSIS — K219 Gastro-esophageal reflux disease without esophagitis: Secondary | ICD-10-CM

## 2016-04-01 DIAGNOSIS — J302 Other seasonal allergic rhinitis: Secondary | ICD-10-CM

## 2016-04-01 DIAGNOSIS — R058 Other specified cough: Secondary | ICD-10-CM

## 2016-04-01 MED ORDER — MONTELUKAST SODIUM 10 MG PO TABS: 10.0000 mg | ORAL_TABLET | Freq: Every day | ORAL | Status: DC

## 2016-04-01 MED ORDER — BUDESONIDE-FORMOTEROL FUMARATE 160-4.5 MCG/ACT IN AERO: 2.0000 | INHALATION_SPRAY | Freq: Two times a day (BID) | RESPIRATORY_TRACT | Status: DC

## 2016-04-01 NOTE — Patient Instructions (Signed)
EKG today I think this is either prolonged persistent post infectious cough or flare up of cough variant asthma. Increase symbicort to 160mg  2 puffs twice daily, start 1 month trial of singulair - you will know if this is helpful. Update me with effect over next 2 weeks. Good to see you today, call us with questions.

## 2016-04-01 NOTE — Progress Notes (Signed)
Pre visit review using our clinic review tool, if applicable. No additional management support is needed unless otherwise documented below in the visit note. 

## 2016-04-01 NOTE — Progress Notes (Addendum)
BP 130/86 mmHg  Pulse 66  Temp(Src) 97.5 F (36.4 C) (Oral)  Wt 231 lb 8 oz (105.008 kg)  SpO2 98%   CC: cough x6 wks Subjective:    Patient ID: Jordan Buchanan, male    DOB: 1971/11/01, 45 y.o.   MRN: [id number]  HPI: Adalbert Dumitrescu is a 45 y.o. male presenting on 04/01/2016 for Cough   URI started early March - seen here 03/03/2016 and again by Dr Lacinda Axon 03/10/2016 with persistent cough. Initially treated with zpack, cheratussin cough syrup, then with prednisone course. Some intermittent chest pain/tightness. Dry, nonproductive. Yesterday had coughing fit that led to dry heaves. Noticing increased dyspnea as well. No fevers. Treating with lots of cough drops.   No recent prolonged travel. No hormone use. Not around smokers. No personal or family h/o blood clots. No leg swelling.   Known cough variant asthma controlled with PRN albuterol inhaler. Regular with symbicort. Prior on dulera which helped more.   Denies significant GERD symptoms - on nexium daily.  Allergic rhinitis controlled zyrtec/claritin and flonase.   Relevant past medical, surgical, family and social history reviewed and updated as indicated. Interim medical history since our last visit reviewed. Allergies and medications reviewed and updated. Current Outpatient Prescriptions on File Prior to Visit  Medication Sig  . albuterol (VENTOLIN HFA) 108 (90 Base) MCG/ACT inhaler Inhale 2 puffs into the lungs every 4 (four) hours as needed for wheezing or shortness of breath.  Marland Kitchen amLODipine (NORVASC) 10 MG tablet TAKE 1 TABLET (10 MG TOTAL) BY MOUTH DAILY.  . cetirizine (ZYRTEC) 10 MG tablet Take 10 mg by mouth at bedtime.   Marland Kitchen esomeprazole (NEXIUM) 40 MG capsule Take 1 capsule (40 mg total) by mouth daily at 12 noon.  . fluticasone (FLONASE) 50 MCG/ACT nasal spray Place 2 sprays into both nostrils daily.  . metoprolol succinate (TOPROL-XL) 50 MG 24 hr tablet Take 1 tablet (50 mg total) by mouth daily.  . naproxen sodium (ANAPROX)  550 MG tablet Take 550 mg by mouth daily as needed (pain).   No current facility-administered medications on file prior to visit.    Review of Systems Per HPI unless specifically indicated in ROS section     Objective:    BP 130/86 mmHg  Pulse 66  Temp(Src) 97.5 F (36.4 C) (Oral)  Wt 231 lb 8 oz (105.008 kg)  SpO2 98%  Wt Readings from Last 3 Encounters:  04/01/16 231 lb 8 oz (105.008 kg)  03/10/16 233 lb 9.6 oz (105.96 kg)  03/03/16 230 lb (104.327 kg)    Physical Exam  Constitutional: He appears well-developed and well-nourished. No distress.  HENT:  Head: Normocephalic and atraumatic.  Right Ear: Hearing, tympanic membrane, external ear and ear canal normal.  Left Ear: Hearing, tympanic membrane, external ear and ear canal normal.  Nose: Nose normal. Right sinus exhibits no maxillary sinus tenderness and no frontal sinus tenderness. Left sinus exhibits no maxillary sinus tenderness and no frontal sinus tenderness.  Mouth/Throat: Uvula is midline, oropharynx is clear and moist and mucous membranes are normal. No oropharyngeal exudate, posterior oropharyngeal edema, posterior oropharyngeal erythema or tonsillar abscesses.  Eyes: Conjunctivae and EOM are normal. Pupils are equal, round, and reactive to light. No scleral icterus.  Neck: Normal range of motion. Neck supple.  Cardiovascular: Normal rate, regular rhythm, normal heart sounds and intact distal pulses.   No murmur heard. Pulmonary/Chest: Effort normal and breath sounds normal. No respiratory distress. He has no wheezes. He has no rales.  Lymphadenopathy:    He has no cervical adenopathy.  Skin: Skin is warm and dry. No rash noted.  Psychiatric: He has a normal mood and affect.  Nursing note and vitals reviewed.     Assessment & Plan:   Problem List Items Addressed This Visit    Seasonal allergies   GERD (gastroesophageal reflux disease)   Cough variant asthma   Relevant Medications   budesonide-formoterol  (SYMBICORT) 160-4.5 MCG/ACT inhaler   montelukast (SINGULAIR) 10 MG tablet   Post-viral cough syndrome - Primary    Continued cough after initial URI 02/2016 - anticipate persistent post viral cough syndrome vs alternatively allergic rhinitis related cough vs cough variant asthma.  Increase symbicort to 160mg  2 puffs BID and start singulair trial x 1 mo. Update with effect in 2 wks.  nexium controlling GERD. No risk factors for PE, vitals stable. Discussed D dimer but decided against checking.  Check EKG today for endorsed chest tightness with cough. Pt agrees with plan.  EKG - NSR rate 60s, normal axis, intervals, no acute ST/T changes       Other Visit Diagnoses    Chest tightness        Relevant Orders    EKG 12-Lead (Completed)        Follow up plan: Return if symptoms worsen or fail to improve.  Ria Bush, MD

## 2016-04-01 NOTE — Assessment & Plan Note (Addendum)
Continued cough after initial URI 02/2016 - anticipate persistent post viral cough syndrome vs alternatively allergic rhinitis related cough vs cough variant asthma.  Increase symbicort to 160mg  2 puffs BID and start singulair trial x 1 mo. Update with effect in 2 wks.  nexium controlling GERD. No risk factors for PE, vitals stable. Discussed D dimer but decided against checking.  Check EKG today for endorsed chest tightness with cough. Pt agrees with plan.  EKG - NSR rate 60s, normal axis, intervals, no acute ST/T changes

## 2016-04-12 ENCOUNTER — Ambulatory Visit (INDEPENDENT_AMBULATORY_CARE_PROVIDER_SITE_OTHER): Payer: PRIVATE HEALTH INSURANCE | Admitting: Internal Medicine

## 2016-04-12 ENCOUNTER — Other Ambulatory Visit (INDEPENDENT_AMBULATORY_CARE_PROVIDER_SITE_OTHER): Payer: PRIVATE HEALTH INSURANCE

## 2016-04-12 ENCOUNTER — Encounter: Payer: Self-pay | Admitting: Internal Medicine

## 2016-04-12 ENCOUNTER — Ambulatory Visit (INDEPENDENT_AMBULATORY_CARE_PROVIDER_SITE_OTHER)
Admission: RE | Admit: 2016-04-12 | Discharge: 2016-04-12 | Disposition: A | Discharge status: Home / Self Care | Payer: PRIVATE HEALTH INSURANCE | Source: Ambulatory Visit | Attending: Internal Medicine | Admitting: Internal Medicine

## 2016-04-12 VITALS — BP 126/88 | HR 88 | Ht 70.0 in | Wt 236.0 lb

## 2016-04-12 DIAGNOSIS — J45991 Cough variant asthma: Secondary | ICD-10-CM

## 2016-04-12 LAB — CBC WITH DIFFERENTIAL/PLATELET
BASOS ABS: 0.1 10*3/uL (ref 0.0–0.1)
BASOS PCT: 0.7 % (ref 0.0–3.0)
Eosinophils Absolute: 0.2 10*3/uL (ref 0.0–0.7)
Eosinophils Relative: 2 % (ref 0.0–5.0)
HEMATOCRIT: 47.5 % (ref 39.0–52.0)
HEMOGLOBIN: 16.5 g/dL (ref 13.0–17.0)
LYMPHS PCT: 28.7 % (ref 12.0–46.0)
Lymphs Abs: 3.1 10*3/uL (ref 0.7–4.0)
MCHC: 34.7 g/dL (ref 30.0–36.0)
MCV: 88 fl (ref 78.0–100.0)
MONOS PCT: 6.3 % (ref 3.0–12.0)
Monocytes Absolute: 0.7 10*3/uL (ref 0.1–1.0)
NEUTROS ABS: 6.7 10*3/uL (ref 1.4–7.7)
Neutrophils Relative %: 62.3 % (ref 43.0–77.0)
PLATELETS: 337 10*3/uL (ref 150.0–400.0)
RBC: 5.4 Mil/uL (ref 4.22–5.81)
RDW: 13.6 % (ref 11.5–15.5)
WBC: 10.8 10*3/uL — ABNORMAL HIGH (ref 4.0–10.5)

## 2016-04-12 LAB — NITRIC OXIDE: Nitric Oxide: 8

## 2016-04-12 MED ORDER — ESOMEPRAZOLE MAGNESIUM 40 MG PO CPDR: DELAYED_RELEASE_CAPSULE | ORAL | Status: DC

## 2016-04-12 MED ORDER — TRAMADOL HCL 50 MG PO TABS: ORAL_TABLET | ORAL | Status: DC

## 2016-04-12 MED ORDER — PREDNISONE 10 MG PO TABS: ORAL_TABLET | ORAL | Status: DC

## 2016-04-12 MED ORDER — MOMETASONE FURO-FORMOTEROL FUM 100-5 MCG/ACT IN AERO: INHALATION_SPRAY | RESPIRATORY_TRACT | Status: DC

## 2016-04-12 NOTE — Assessment & Plan Note (Addendum)
05/27/2014  try dulera 100 2bid  - PFT's 07/03/2014 FEV1 4.15 (98%) ratio 89 and no chang p B2 and nl fef25-75  And nl dlco  - NO during flare 04/12/2016  = 18 - Allergy profile 04/12/2016 >  Eos 0. /  IgE   - 04/12/2016  After extensive coaching HFA effectiveness =    75% > rechallenge with dulera 100 2bid samples   Symptoms are markedly disproportionate to objective findings and not clear this is a lung problem but pt does appear to have difficult airway management issues. DDX of  difficult airways management almost all start with A and  include Adherence, Ace Inhibitors, Acid Reflux, Active Sinus Disease, Alpha 1 Antitripsin deficiency, Anxiety masquerading as Airways dz,  ABPA,  Allergy(esp in young), Aspiration (esp in elderly), Adverse effects of meds,  Active smokers, A bunch of PE's (a small clot burden can't cause this syndrome unless there is already severe underlying pulm or vascular dz with poor reserve) plus two Bs  = Bronchiectasis and Beta blocker use..and one C= CHF  Adherence is always the initial "prime suspect" and is a multilayered concern that requires a "trust but verify" approach in every patient - starting with knowing how to use medications, especially inhalers, correctly, keeping up with refills and understanding the fundamental difference between maintenance and prns vs those medications only taken for a very short course and then stopped and not refilled.  - The proper method of use, as well as anticipated side effects, of a metered-dose inhaler are discussed and demonstrated to the patient. Improved effectiveness after extensive coaching during this visit to a level of approximately 75 % from a baseline of 50 %  - Formulary restrictions will be an ongoing challenge for the forseable future and I would be happy to pick an alternative if the pt will first  provide me a list of them but pt  will need to return here for training for any new device that is required eg dpi vs hfa vs  respimat.    In meantime we can always provide samples so the patient never runs out of any needed respiratory medications.    ? Allergies > already on singulair and unlikley with NO so low but check profile to be complete and Prednisone 10 mg take  4 each am x 2 days,   2 each am x 2 days,  1 each am x 2 days and stop  - low threshold to stop singulair once improves   ? Acid (or non-acid) GERD > always difficult to exclude as up to 75% of pts in some series report no assoc GI/ Heartburn symptoms> rec max (24h)  acid suppression and diet restrictions/ reviewed and instructions given in writing.   ? Active sinus dz > check sinus ct next  ? BB effect > possible but toprol dose relatively low and no noct wheezing > consider change to low dose bisoprolol next ov   I had an extended discussion with the patient reviewing all relevant studies completed to date and  lasting 25 minutes of a 40  minute extended acute/ re-establish office visit    Each maintenance medication was reviewed in detail including most importantly the difference between maintenance and prns and under what circumstances the prns are to be triggered using an action plan format that is not reflected in the computer generated alphabetically organized AVS.    Please see instructions for details which were reviewed in writing and the patient given a  copy highlighting the part that I personally wrote and discussed at today's ov.

## 2016-04-12 NOTE — Progress Notes (Signed)
Subjective:    Patient ID: Jordan Buchanan, male    DOB: 1971/01/25  MRN: [id number]   Brief patient profile:  42 yowm quit smoking 2001  with ? pna 4/5th grade but completely back to nl = competitive sports and some sob while smoking but not a big deal, had some tendency to sinus infections in last decade attributed to outdoor greenskeeping but in early May 2015 onset of nasal congestion/ sore throat > eval by Dr Leo Grosser with neg strep test rx with augmentin some better then severe cough then rx zpak / pred x 2 then worse in ER June 5th 2015 rx with albuterol by Dr Jeanell Sparrow and referred to pulmonary clinic   History of Present Illness  05/27/2014 1st [facility] office visit/ Gian Ybarra  Re cough x early May 2015  Chief Complaint  Patient presents with  . Pulmonary Consult    Self referral. Pt c/o SOB and cough for the past month. Cough is mainly dry, but produces minimal sputum in the am's- ? color.  He is using ventolin HFA every 4 hrs.    on prilosec daily then eat 4 h later, ventolin helps transiently with cough and breathing  >dulera , PPI and delsym , steroid taper.    07/03/2014 f/u ov/Alfred Eckley re: ?cough variant / rx gerd rx plus  dulera 100 2bid, no need for saba  Chief Complaint  Patient presents with  . Follow-up    Pt reports that his cough is much improved. Rash has resolved. No new co's today.    Not limited by breathing from desired activities    Not needing any cough suppression rec dulera 100 to up 2 puffs every 12 hours, ok to adjust down and off if doing great.  F/u prn   04/12/2016  Acute extended  Ov/ -re-establish Phoebie Shad re: ? Cough variant asthma vs uacs / springtime rhinitis on multiple otc  Chief Complaint  Patient presents with  . Acute Visit    Pt c/o CP on exhalation and non prod cough x 8 wks. He also c/o rash on his stomach and legs- itchy.   tapered dulera off then months later  Cough flared  Around Jan 2017  >  insurance symbicort 80 2bid  Wasn't sure it helped  it then bad URI > much worse cough  hen increased the symbicort to 160 x 3 weeks and no better.  Wakes up nl hours then 30 min later starts cough dry hacking to point of heaving worst time of day is before brfast then cough drops help some but has to stay on them all day until hs then stays quiet all night if takes a cough drop at hs  nexium was already maint pior to flare/ new rx added = singulair but no better.  Rash "appears every spring x sev years/ "derm thinks contact dermatitis"   No obvious patterns in  day to day or daytime variability or assoc sob or classically pleuritic or ex cp or chest tightness, subjective wheeze or overt sinus or hb symptoms. No unusual exp hx or h/o childhood pna/ asthma or knowledge of premature birth.  Sleeping ok without nocturnal  or early am exacerbation  of respiratory  c/o's or need for noct saba. Also denies any obvious fluctuation of symptoms with weather or environmental changes or other aggravating or alleviating factors except as outlined above   Current Medications, Allergies, Complete Past Medical History, Past Surgical History, Family History, and Social History were reviewed in  [facility] electronic medical record.  ROS  The following are not active complaints unless bolded sore throat, dysphagia, dental problems, itching, sneezing,  nasal congestion or excess/ purulent secretions, ear ache,   fever, chills, sweats, unintended wt loss, classically pleuritic or exertional cp, hemoptysis,  orthopnea pnd or leg swelling, presyncope, palpitations, abdominal pain, anorexia, nausea, vomiting, diarrhea  or change in bowel or bladder habits, change in stools or urine, dysuria,hematuria,  rash, arthralgias, visual complaints, headache, numbness, weakness or ataxia or problems with walking or coordination,  change in mood/affect or memory.            Objective:   Physical Exam   amb wm nad  Wt Readings from Last 3 Encounters:  04/12/16 236 lb  (107.049 kg)  04/01/16 231 lb 8 oz (105.008 kg)  03/10/16 233 lb 9.6 oz (105.96 kg)    Vital signs reviewed     HEENT: nl dentition  and orophanx. Nl external ear canals without cough reflex - severe  bilateral non-specific turbinate edema    NECK :  without JVD/Nodes/TM/ nl carotid upstrokes bilaterally   LUNGS: no acc muscle use, clear to A and P bilaterally without cough on insp or exp maneuvers   CV:  RRR  no s3 or murmur or increase in P2, no edema   ABD:  soft and nontender with nl excursion in the supine position. No bruits or organomegaly, bowel sounds nl  MS:  warm without deformities, calf tenderness, cyanosis or clubbing  SKIN: splotchy erythematous macular rash over lower abd and upper thighs bilaterally    NEURO:  alert, approp, no deficits     CXR PA and Lateral:   04/12/2016 :    I personally reviewed images and agree with radiology impression as follows:   No active cardiopulmonary disease.   Labs ordered today / images reviewed include:  Eos/allergy profile     Assessment & Plan:

## 2016-04-12 NOTE — Patient Instructions (Addendum)
Plan A = Automatic = dulera 100 Take 2 puffs first thing in am and then another 2 puffs about 12 hours later until return   Work on inhaler technique:  relax and gently blow all the way out then take a nice smooth deep breath back in, triggering the inhaler at same time you start breathing in.  Hold for up to 5 seconds if you can. Blow out thru nose. Rinse and gargle with water when done Practice your techique with empty symbicort      Plan B = Backup Only use your albuterol (ventolin) as a rescue medication to be used if you can't catch your breath by resting or doing a relaxed purse lip breathing pattern.  - The less you use it, the better it will work when you need it. - Ok to use the inhaler up to 2 puffs  every 4 hours if you must but call for appointment if use goes up over your usual need - Don't leave home without it !!  (think of it like the spare tire for your car)   Prednisone 10 mg take  4 each am x 2 days,  2 each am x 2 days,  1 each am x 2 days and stop     Change Nexium  Take 30- 60 min before your first and last meals of the day   GERD (REFLUX)  is an extremely common cause of respiratory symptoms, many times with no significant heartburn at all.    It can be treated with medication, but also with lifestyle changes including avoidance of late meals, excessive alcohol, smoking cessation, and avoid fatty foods, chocolate, peppermint, colas, red wine, and acidic juices such as orange juice.  NO MINT OR MENTHOL PRODUCTS SO NO COUGH DROPS  USE SUGARLESS CANDY INSTEAD (jolley ranchers or Stover's)  NO OIL BASED VITAMINS - use powdered substitutes.  Take delsym two tsp every 12 hours and supplement if needed with  tramadol 50 mg up to 2 every 4 hours to suppress the urge to cough. Swallowing water or using ice chips/non mint and menthol containing candies (such as lifesavers or sugarless jolly ranchers) are also effective.  You should rest your voice and avoid activities that you  know make you cough.  Once you have eliminated the cough for 3 straight days try reducing the tramadol first,  then the delsym as tolerated.    Please schedule a follow up office visit in 4 weeks, sooner if needed

## 2016-04-13 ENCOUNTER — Telehealth: Payer: Self-pay | Admitting: Internal Medicine

## 2016-04-13 LAB — RESPIRATORY ALLERGY PROFILE REGION II ~~LOC~~
Allergen, Cedar tree, t12: <0.1 kU/L
Allergen, Mouse Urine Protein, e78: <0.1 kU/L
Allergen, Mulberry, t76: <0.1 kU/L
Alternaria Alternata: <0.1 kU/L
Aspergillus fumigatus, m3: <0.1 kU/L
Bermuda Grass: <0.1 kU/L
Cladosporium Herbarum: <0.1 kU/L
Cockroach: <0.1 kU/L
Common Ragweed: <0.1 kU/L
IGE (IMMUNOGLOBULIN E), SERUM: 25 kU/L (ref <115)
Johnson Grass: <0.1 kU/L
Pecan/Hickory Tree IgE: <0.1 kU/L
Penicillium Notatum: <0.1 kU/L
Rough Pigweed  IgE: <0.1 kU/L
Timothy Grass: <0.1 kU/L

## 2016-04-13 NOTE — Progress Notes (Signed)
Quick Note:  Spoke with pt and notified of results per Dr. Wert. Pt verbalized understanding and denied any questions.  ______ 

## 2016-04-13 NOTE — Progress Notes (Signed)
Quick Note:  LMTCB ______ 

## 2016-04-13 NOTE — Telephone Encounter (Signed)
I called and spoke with the pt and notify of results/ recs per MW  He verbalized understanding  Nothing further needed

## 2016-04-13 NOTE — Progress Notes (Signed)
Quick Note:  lmtcb ______ 

## 2016-04-19 ENCOUNTER — Telehealth: Payer: Self-pay | Admitting: Internal Medicine

## 2016-04-19 MED ORDER — AMOXICILLIN-POT CLAVULANATE 875-125 MG PO TABS: 1.0000 | ORAL_TABLET | Freq: Two times a day (BID) | ORAL | Status: DC

## 2016-04-19 NOTE — Telephone Encounter (Signed)
Augmentin 875 mg take one pill twice daily  X 10 days - take at breakfast and supper with large glass of water.  It would help reduce the usual side effects (diarrhea and yeast infections) if you ate cultured yogurt at lunch.   If not all better at 10 days needs limited sinus CT next step but don't change any of the other rx for now until regroup

## 2016-04-19 NOTE — Telephone Encounter (Signed)
Pt states that he completed his Prednisone and he noticed the coughing returning when he started tapering down. Completed Pred 04/18/16. Pt feels that he is choking at times with the cough - dry cough with very very little mucus production.  Pt states that he has felt some thick Phala Schraeder mucus down in his throat that he has been unable to get up.  Using Deslym with Tramadol for cough suppression. Patient Instructions 04/12/16    Plan A = Automatic = dulera 100 Take 2 puffs first thing in am and then another 2 puffs about 12 hours later until return   Work on inhaler technique: relax and gently blow all the way out then take a nice smooth deep breath back in, triggering the inhaler at same time you start breathing in. Hold for up to 5 seconds if you can. Blow out thru nose. Rinse and gargle with water when done Practice your techique with empty symbicort     Plan B = Backup Only use your albuterol (ventolin) as a rescue medication to be used if you can't catch your breath by resting or doing a relaxed purse lip breathing pattern.  - The less you use it, the better it will work when you need it. - Ok to use the inhaler up to 2 puffs every 4 hours if you must but call for appointment if use goes up over your usual need - Don't leave home without it !! (think of it like the spare tire for your car)   Prednisone 10 mg take 4 each am x 2 days, 2 each am x 2 days, 1 each am x 2 days and stop   Change Nexium Take 30- 60 min before your first and last meals of the day   GERD (REFLUX) is an extremely common cause of respiratory symptoms, many times with no significant heartburn at all.   It can be treated with medication, but also with lifestyle changes including avoidance of late meals, excessive alcohol, smoking cessation, and avoid fatty foods, chocolate, peppermint, colas, red wine, and acidic juices such as orange juice.  NO MINT OR MENTHOL PRODUCTS SO NO COUGH DROPS   USE SUGARLESS  CANDY INSTEAD (jolley ranchers or Stover's)  NO OIL BASED VITAMINS - use powdered substitutes.  Take delsym two tsp every 12 hours and supplement if needed with tramadol 50 mg up to 2 every 4 hours to suppress the urge to cough. Swallowing water or using ice chips/non mint and menthol containing candies (such as lifesavers or sugarless jolly ranchers) are also effective. You should rest your voice and avoid activities that you know make you cough.  Once you have eliminated the cough for 3 straight days try reducing the tramadol first, then the delsym as tolerated.   Please schedule a follow up office visit in 4 weeks, sooner if needed    Please advise Dr Melvyn Novas. Thanks.

## 2016-04-19 NOTE — Telephone Encounter (Signed)
I called and spoke with the pt and notified of recs per MW  He verbalized understanding  Nothing further needed  Rx was sent

## 2016-04-20 ENCOUNTER — Encounter: Payer: Self-pay | Admitting: *Deleted

## 2016-04-20 ENCOUNTER — Encounter: Payer: Self-pay | Admitting: Internal Medicine

## 2016-04-20 ENCOUNTER — Ambulatory Visit (INDEPENDENT_AMBULATORY_CARE_PROVIDER_SITE_OTHER): Payer: PRIVATE HEALTH INSURANCE | Admitting: Internal Medicine

## 2016-04-20 ENCOUNTER — Telehealth: Payer: Self-pay | Admitting: Internal Medicine

## 2016-04-20 VITALS — BP 120/82 | HR 83 | Temp 98.0°F | Wt 231.0 lb

## 2016-04-20 DIAGNOSIS — J45991 Cough variant asthma: Secondary | ICD-10-CM

## 2016-04-20 MED ORDER — ESOMEPRAZOLE MAGNESIUM 40 MG PO CPDR: DELAYED_RELEASE_CAPSULE | ORAL | Status: DC

## 2016-04-20 MED ORDER — PREDNISONE 10 MG PO TABS: ORAL_TABLET | ORAL | Status: DC

## 2016-04-20 NOTE — Telephone Encounter (Signed)
Spoke with pt's wife.  Discussed below and appt scheduled today at 2:45 pm with MW.  Wife aware to have pt bring all active meds to appt.  She verbalized understanding, will inform pt, and voiced no further questions or concerns at this time.  Will sign off and send to PCP.

## 2016-04-20 NOTE — Telephone Encounter (Signed)
Patient's wife calling stating that husband is in tears because he feels like someone is holding his throat, choking him.  She said that he started the antibiotics yesterday, but he is very frustrated because he claims that he is not getting the help he needs, he cried most of the night last night because he told his wife that he is so frustrated that the cough will not go away and that he is "choking to death".  Wife wants to know if he needs to see an ENT? She wants to know what else can be done.  She said that she doesn't see why he is on an antibiotic.  She didn't think that he has anything that warrants an antibiotic.  She is very concerned for her husband since he was crying most of the night last night due to this problem.  DR. Melvyn Novas, please advise.   Tanda Rockers, MD at 04/19/2016 3:46 PM     Status: Signed       Expand All Collapse All   Augmentin 875 mg take one pill twice daily X 10 days - take at breakfast and supper with large glass of water. It would help reduce the usual side effects (diarrhea and yeast infections) if you ate cultured yogurt at lunch.   If not all better at 10 days needs limited sinus CT next step but don't change any of the other rx for now until regroup            Virl Cagey, CMA at 04/19/2016 3:15 PM     Status: Signed       Expand All Collapse All   Pt states that he completed his Prednisone and he noticed the coughing returning when he started tapering down. Completed Pred 04/18/16. Pt feels that he is choking at times with the cough - dry cough with very very little mucus production.  Pt states that he has felt some thick green mucus down in his throat that he has been unable to get up.  Using Deslym with Tramadol for cough suppression. Patient Instructions 04/12/16    Plan A = Automatic = dulera 100 Take 2 puffs first thing in am and then another 2 puffs about 12 hours later until return   Work on inhaler technique: relax and gently  blow all the way out then take a nice smooth deep breath back in, triggering the inhaler at same time you start breathing in. Hold for up to 5 seconds if you can. Blow out thru nose. Rinse and gargle with water when done Practice your techique with empty symbicort     Plan B = Backup Only use your albuterol (ventolin) as a rescue medication to be used if you can't catch your breath by resting or doing a relaxed purse lip breathing pattern.  - The less you use it, the better it will work when you need it. - Ok to use the inhaler up to 2 puffs every 4 hours if you must but call for appointment if use goes up over your usual need - Don't leave home without it !! (think of it like the spare tire for your car)   Prednisone 10 mg take 4 each am x 2 days, 2 each am x 2 days, 1 each am x 2 days and stop   Change Nexium Take 30- 60 min before your first and last meals of the day   GERD (REFLUX) is an extremely common cause of respiratory symptoms, many  times with no significant heartburn at all.   It can be treated with medication, but also with lifestyle changes including avoidance of late meals, excessive alcohol, smoking cessation, and avoid fatty foods, chocolate, peppermint, colas, red wine, and acidic juices such as orange juice.  NO MINT OR MENTHOL PRODUCTS SO NO COUGH DROPS  USE SUGARLESS CANDY INSTEAD (jolley ranchers or Stover's)  NO OIL BASED VITAMINS - use powdered substitutes.  Take delsym two tsp every 12 hours and supplement if needed with tramadol 50 mg up to 2 every 4 hours to suppress the urge to cough. Swallowing water or using ice chips/non mint and menthol containing candies (such as lifesavers or sugarless jolly ranchers) are also effective. You should rest your voice and avoid activities that you know make you cough.  Once you have eliminated the cough for 3 straight days try reducing the tramadol first, then the delsym as tolerated.   Please schedule a  follow up office visit in 4 weeks, sooner if needed

## 2016-04-20 NOTE — Telephone Encounter (Signed)
The reason for the abx was his message to [place] as documented 04/19/16 Pt states that he has felt some thick green mucus down in his throat that he has been unable to get up.   I have only seen him once for this particular flare and happy to see again with all active meds in hand and we can work him in or go ahead and refer to ENT whichever is the fastest but agree he needs to be seen   Copy to primary please ? Is there a functional component to his symptoms?

## 2016-04-20 NOTE — Patient Instructions (Addendum)
Stop dulera for now  Only use your albuterol as a rescue medication to be used if you can't catch your breath by resting or doing a relaxed purse lip breathing pattern.  - The less you use it, the better it will work when you need it. - Ok to use up to 2 puffs  every 4 hours if you must but call for immediate appointment if use goes up over your usual need - Don't leave home without it !!  (think of it like the spare tire for your car)   Prednisone 10 mg Take 4 for three days 3 for three days 2 for three days 1 for three days and stop   Nexium 40 mg Take 30- 60 min before your first and last meals of the day   For drainage / throat tickle/restriction  try take CHLORPHENIRAMINE  4 mg - take one every 4 hours as needed - available over the counter- may cause drowsiness so start with just a bedtime dose or two and see how you tolerate it before trying in daytime     Please schedule a follow up office visit in 2  weeks, sooner if needed  Late add :  Sinus CT needs to be done/  hold off augmentin for now

## 2016-04-20 NOTE — Telephone Encounter (Signed)
Patient wife calling back to check status of the message she left earlier. She can be reached at [phone]- Thanks-prm

## 2016-04-20 NOTE — Progress Notes (Signed)
Subjective:   Patient ID: Jordan Buchanan, male    DOB: May 04, 1971   MRN: [id number]   Brief patient profile:  27 yowm quit smoking 2001  with ? pna 4/5th grade but completely back to nl = competitive sports and some sob while smoking but not a big deal, had some tendency to sinus infections in last decade attributed to outdoor greenskeeping but in early May 2015 onset of nasal congestion/ sore throat > eval by Dr Leo Grosser with neg strep test rx with augmentin some better then severe cough then rx zpak / pred x 2 then worse in ER June 5th 2015 rx with albuterol by Dr Jeanell Sparrow and referred to pulmonary clinic     History of Present Illness  05/27/2014 1st [facility] office visit/ Jordan Buchanan  Re cough x early May 2015  Chief Complaint  Patient presents with  . Pulmonary Consult    Self referral. Pt c/o SOB and cough for the past month. Cough is mainly dry, but produces minimal sputum in the am's- ? color.  He is using ventolin HFA every 4 hrs.    on prilosec daily then eat 4 h later, ventolin helps transiently with cough and breathing  >dulera , PPI and delsym , steroid taper    07/03/2014 f/u ov/Jordan Buchanan re: ?cough variant / rx gerd rx plus  dulera 100 2bid, no need for saba  Chief Complaint  Patient presents with  . Follow-up    Pt reports that his cough is much improved. Rash has resolved. No new co's today.    Not limited by breathing from desired activities    Not needing any cough suppression rec dulera 100 to up 2 puffs every 12 hours, ok to adjust down and off if doing great.  F/u prn   04/12/2016  Acute extended  Ov/ -re-establish Jordan Buchanan re: ? Cough variant asthma vs uacs / springtime rhinitis on multiple otc  Chief Complaint  Patient presents with  . Acute Visit    Pt c/o CP on exhalation and non prod cough x 8 wks. He also c/o rash on his stomach and legs- itchy.   tapered dulera off then months later  Cough flared  Around Jan 2017  >  insurance symbicort 80 2bid  Wasn't sure it  helped it then bad URI > much worse cough  then increased the symbicort to 160 x 3 weeks and no better.  Wakes up nl hours then 30 min later starts cough dry hacking to point of heaving worst time of day is before brfast then cough drops help some but has to stay on them all day until hs then stays quiet all night if takes a cough drop at hs  nexium was already maint pior to flare/ new rx added = singulair but no better.  Rash "appears every spring x sev years/ "derm thinks contact dermatitis" rec Plan A = Automatic = dulera 100 Take 2 puffs first thing in am and then another 2 puffs about 12 hours later until return  Work on inhaler technique:  Plan B = Backup Only use your albuterol (ventolin) as a rescue medication Prednisone 10 mg take  4 each am x 2 days,  2 each am x 2 days,  1 each am x 2 days and stop    Change Nexium  Take 30- 60 min before your first and last meals of the day  GERD diet  Take delsym two tsp every 12 hours and supplement if needed with  tramadol 50 mg up to 2 every 4 hours to suppress the urge to cough.   Once you have eliminated the cough for 3 straight days try reducing the tramadol first,  then the delsym as tolerated.       04/20/2016 acute extended ov/Jordan Buchanan re: shrill cough since January 2017 no better back on dulera 100 2 bid maint singulair also   Chief Complaint  Patient presents with  . Acute Visit    Increased coughing x1 week. Finished prednisone pack from last week. Cough returned after this.  cough much worse day than night/ mostly dry/ gone completely with prednisone then recurred w/in a day of stopping and is very harsh/  Shrill dry upper airway assoc with sensation of globus = strangling sensation but does not disturb sleep.    No obvious patterns in  day to day or daytime variability or assoc sob unless coughing  or classically pleuritic or ex cp or chest tightness, subjective wheeze or overt sinus or hb symptoms. No unusual exp hx or h/o childhood pna/  asthma or knowledge of premature birth.  Sleeping ok without nocturnal  or early am exacerbation  of respiratory  c/o's or need for noct saba. Also denies any obvious fluctuation of symptoms with weather or environmental changes or other aggravating or alleviating factors except as outlined above   Current Medications, Allergies, Complete Past Medical History, Past Surgical History, Family History, and Social History were reviewed in [employer] record.  ROS  The following are not active complaints unless bolded sore throat, dysphagia, dental problems, itching, sneezing,  nasal congestion or excess/ purulent secretions, ear ache,   fever, chills, sweats, unintended wt loss, classically pleuritic or exertional cp, hemoptysis,  orthopnea pnd or leg swelling, presyncope, palpitations, abdominal pain, anorexia, nausea, vomiting, diarrhea  or change in bowel or bladder habits, change in stools or urine, dysuria,hematuria,  rash, arthralgias, visual complaints, headache, numbness, weakness or ataxia or problems with walking or coordination,  change in mood/affect or memory.            Objective:   Physical Exam   amb wm nad  04/20/2016           231  04/12/16 236 lb (107.049 kg)  04/01/16 231 lb 8 oz (105.008 kg)  03/10/16 233 lb 9.6 oz (105.96 kg)    Vital signs reviewed     HEENT: nl dentition  and orophanx. Nl external ear canals without cough reflex - moderate  bilateral non-specific turbinate edema    NECK :  without JVD/Nodes/TM/ nl carotid upstrokes bilaterally   LUNGS: no acc muscle use, clear to A and P bilaterally without cough on insp or exp maneuvers   CV:  RRR  no s3 or murmur or increase in P2, no edema   ABD:  soft and nontender with nl excursion in the supine position. No bruits or organomegaly, bowel sounds nl  MS:  warm without deformities, calf tenderness, cyanosis or clubbing  SKIN: splotchy erythematous macular rash over lower abd and upper  thighs bilaterally    NEURO:  alert, approp, no deficits     CXR PA and Lateral:   04/12/2016 :    I personally reviewed images and agree with radiology impression as follows:   No active cardiopulmonary disease.        Assessment & Plan:

## 2016-04-21 ENCOUNTER — Telehealth: Payer: Self-pay | Admitting: *Deleted

## 2016-04-21 ENCOUNTER — Telehealth: Payer: Self-pay | Admitting: Internal Medicine

## 2016-04-21 DIAGNOSIS — J45991 Cough variant asthma: Secondary | ICD-10-CM

## 2016-04-21 DIAGNOSIS — E669 Obesity, unspecified: Secondary | ICD-10-CM | POA: Insufficient documentation

## 2016-04-21 DIAGNOSIS — E66811 Obesity, class 1: Secondary | ICD-10-CM | POA: Insufficient documentation

## 2016-04-21 NOTE — Telephone Encounter (Signed)
-----   Message from Tanda Rockers, MD sent at 04/21/2016  5:58 AM EDT ----- Sinus CT needs to be done/  hold off augmentin for now

## 2016-04-21 NOTE — Telephone Encounter (Signed)
Spoke with the pharm  We need to call for PA on Nexium 40 mg bid  Med is only covered for 1 x daily  Ins # [id number]  Pt ID- [id number]  Will forward to my basket to work on later

## 2016-04-21 NOTE — Telephone Encounter (Signed)
Spoke with the pt and notified of recs per MW  I have order the sinus scan and notified him we will call with results and further recs once the results are reviewed

## 2016-04-21 NOTE — Assessment & Plan Note (Addendum)
05/27/2014  try dulera 100 2bid  - PFT's 07/03/2014 FEV1 4.15 (98%) ratio 89 and no chang p B2 and nl fef25-75  And nl dlco  - NO during flare 04/12/2016  = 18 - Allergy profile 04/12/2016 >  Eos 0.2 /  IgE  25 neg RAST  - 04/12/2016  After extensive coaching HFA effectiveness =    75% > rechallenge with dulera 100 2bid samples> no better 04/20/2016 so d/cd  - Spirometry 04/20/2016  wnl including fef 25-75  Reported response to pred is non specific and strangling sensation daytime s noct symptoms or response to dulera strongly suggests this is uacs, not asthma, so ok to leave off dulera for now, consider MCT next step   Also needs sinus CT to complete the w/u  I had an extended discussion with the patient and wife (who do not appear to be on the same page with [place] re reporting of symptoms as evidenced in the phone notes "why is he on abx"  And Pt reporting green mucus to triage yesterday and denying today) reviewing all relevant studies completed to date and  lasting 25  minutes of a 40 minute acute visit  Explained:  Of the three most common causes of chronic cough, only one (GERD)  can actually cause the other two (asthma and post nasal drip syndrome)  and perpetuate the cylce of cough inducing airway trauma, inflammation, heightened sensitivity to reflux which is prompted by the cough itself via a cyclical mechanism.    This may partially respond to steroids and look like asthma and post nasal drainage but never erradicated completely unless the cough and the secondary reflux are eliminated, preferably both at the same time.  While not intuitively obvious, many patients with chronic low grade reflux do not cough until there is a secondary insult that disturbs the protective epithelial barrier and exposes sensitive nerve endings.  This can be viral or direct physical injury such as with an endotracheal tube.   The point is that once this occurs, it is difficult to eliminate using anything but a maximally  effective acid suppression regimen at least in the short run, accompanied by an appropriate diet to address non acid GERD/ reviewed.  Each maintenance medication was reviewed in detail including most importantly the difference between maintenance and prns and under what circumstances the prns are to be triggered using an action plan format that is not reflected in the computer generated alphabetically organized AVS.    Please see instructions for details which were reviewed in writing and the patient given a copy highlighting the part that I personally wrote and discussed at today's ov.

## 2016-04-22 ENCOUNTER — Telehealth: Payer: Self-pay | Admitting: Internal Medicine

## 2016-04-22 NOTE — Telephone Encounter (Signed)
Wife states that since last OV cough has not helped with cough.  Pt stopped Dulera as directed and cough improved very little.  Prednisone is not helping much at all.  Has used Albuterol HFA, not helping.  Pt also using Chlorpheniramine as directed.  Pt is coughing throughout the night now where as before the cough was only present with exertional activities.  Denies SOB, chest tightness or pain. CT scan is scheduled for 04/23/16 as long as insurance approves it.  Wife wanting to know if CT Sinus would show any abnormality in his throat. Or if there is another scan to be done to check this area for abnormalities.   Please advise Dr Melvyn Novas. Thanks.   Patient Instructions 04/20/16     Stop dulera for now  Only use your albuterol as a rescue medication to be used if you can't catch your breath by resting or doing a relaxed purse lip breathing pattern.  - The less you use it, the better it will work when you need it. - Ok to use up to 2 puffs every 4 hours if you must but call for immediate appointment if use goes up over your usual need - Don't leave home without it !! (think of it like the spare tire for your car)   Prednisone 10 mg Take 4 for three days 3 for three days 2 for three days 1 for three days and stop   Nexium 40 mg Take 30- 60 min before your first and last meals of the day   For drainage / throat tickle/restriction try take CHLORPHENIRAMINE 4 mg - take one every 4 hours as needed - available over the counter- may cause drowsiness so start with just a bedtime dose or two and see how you tolerate it before trying in daytime    Please schedule a follow up office visit in 2 weeks, sooner if needed  Late add : Sinus CT needs to be done/ hold off augmentin for now

## 2016-04-22 NOTE — Telephone Encounter (Signed)
#  40 but if can't eliminate the cough with 2 q4h need to start gabapentin 100 mg tid now and not add additional or stronger narcotics

## 2016-04-22 NOTE — Telephone Encounter (Signed)
Fine with me to be out until MOnday 04/26/16 if uses it to suppress the cough adequately and sees [place] first of the week if not all better to regroup with all meds in hand

## 2016-04-22 NOTE — Telephone Encounter (Signed)
So the theory that the prednisone stopped the cough is not holding up and def needs sinus CT now and nothing else  The tramadol should control the cough if take 2 every 4 hours and if not then needs ov with all meds in hand to regroup vs other option is to refer to [facility] for throat evaluation (Dr Joya Gaskins)  But ct of throat will not be helpful based on the pfts we did last ov (there is nothing structural blocking his throat)

## 2016-04-22 NOTE — Telephone Encounter (Signed)
Dr. Melvyn Novas, please advise on work note pt is requesting.  Thank you.

## 2016-04-22 NOTE — Telephone Encounter (Signed)
lmtcb X1 for pt  

## 2016-04-22 NOTE — Telephone Encounter (Signed)
Pt aware of rec's per MW. Pt states that he gets severely SOB and starts coughing with any exertion now. Feels that he is choking and that there is something restricting the airflow in his throat.  (1) Please advise if okay to send Rx for Tramadol, please specify qty# - pt only has 9 pills left of current Rx.  (2) Pt is using Deslym as directed. Pt wants to know if there is something that can be used in addition to the Delsym in between his doses with Tramadol (2 q4hrs).  (3) Pt requesting a note for work to be out the rest of the week - return Monday 04/26/16  Please advise Dr Melvyn Novas. Thanks.

## 2016-04-23 ENCOUNTER — Encounter: Payer: Self-pay | Admitting: *Deleted

## 2016-04-23 ENCOUNTER — Ambulatory Visit (INDEPENDENT_AMBULATORY_CARE_PROVIDER_SITE_OTHER)
Admission: RE | Admit: 2016-04-23 | Discharge: 2016-04-23 | Disposition: A | Discharge status: Home / Self Care | Payer: PRIVATE HEALTH INSURANCE | Source: Ambulatory Visit | Attending: Internal Medicine | Admitting: Internal Medicine

## 2016-04-23 DIAGNOSIS — J45991 Cough variant asthma: Secondary | ICD-10-CM

## 2016-04-23 MED ORDER — TRAMADOL HCL 50 MG PO TABS: ORAL_TABLET | ORAL | Status: DC

## 2016-04-23 NOTE — Telephone Encounter (Signed)
Attempted to contact pt's insurance to initiate PA. Was placed on a 20 minute hold without anyone coming to the line. Will try back later.

## 2016-04-23 NOTE — Telephone Encounter (Signed)
[phone], pt cb

## 2016-04-23 NOTE — Telephone Encounter (Signed)
Elizabeth w/ [employer] calling to do PA on Nexium BID and Tramadol  Nexium Approved 5.5.27 through 11.5.17 Spoke with pharmacist and Tramadol was approved as well  Called [facility] and spoke with Marjory Lies who verified that both medications when through as no charge for the patient Nothing further needed; will sign off.

## 2016-04-23 NOTE — Progress Notes (Signed)
Quick Note:  Spoke with pt and notified of results per Dr. Wert. Pt verbalized understanding and denied any questions.  ______ 

## 2016-04-23 NOTE — Telephone Encounter (Signed)
Called, spoke with pt.  Discussed below per Dr. Melvyn Novas.  Pt states he started takig 2 tramadol yesterday and is has "help tremendously and is under control now."  Pt is aware [facility] [place] and work note ready for pick up at front office.  He verbalized understanding and is aware to call back if cough worsens or dose not resolve for gabapentin rx and OV.  He verbalized understanding, is in agreement with plan, and voiced no further questions or concerns at this time.

## 2016-04-23 NOTE — Telephone Encounter (Signed)
lmtcb x2 for pt. 

## 2016-04-26 ENCOUNTER — Telehealth: Payer: Self-pay | Admitting: Family Medicine

## 2016-04-26 NOTE — Telephone Encounter (Signed)
Patient's wife would like a call back from Dr. Danise Mina concerning a chronic cough that the patient has. The patient has seen Dr. Marlene Lard at Pulmonary but they are unable to figure out the cause. Patient does not know where to proceed now.  Please advise.

## 2016-04-26 NOTE — Telephone Encounter (Signed)
Reviewed chart including CT scan on Friday. Cough variant asthma vs cyclic cough flared by GERD Suggested stop naproxyn (was taking 550mg  in am), add zantac 150mg  nightly, continue other regimen. If no improvement, f/u sooner with Dr Melvyn Novas. Pt and wife agree with plan.

## 2016-04-28 ENCOUNTER — Telehealth: Payer: Self-pay | Admitting: *Deleted

## 2016-04-28 NOTE — Telephone Encounter (Signed)
Tramadol has been approved from 04/23/16 - 05/24/16 Called [facility] and advised of approval. [facility] advised that they received approval and RX has been received by patient already. Nothing further needed.

## 2016-04-28 NOTE — Telephone Encounter (Signed)
Initiated PA for Tramadol thru [facility]. Key: [id number]  Submitted for review.  [facility] (p) [phone] (f) [phone]

## 2016-05-04 ENCOUNTER — Ambulatory Visit: Payer: PRIVATE HEALTH INSURANCE | Admitting: Internal Medicine

## 2016-05-10 ENCOUNTER — Ambulatory Visit: Payer: PRIVATE HEALTH INSURANCE | Admitting: Internal Medicine

## 2016-05-21 ENCOUNTER — Telehealth: Payer: Self-pay | Admitting: Internal Medicine

## 2016-05-21 DIAGNOSIS — R400 Somnolence: Secondary | ICD-10-CM

## 2016-05-21 NOTE — Telephone Encounter (Signed)
Spoke with pt and he states that he would now like to have a sleep study done. Pt states that this was discussed several OV's ago, though I cannot find any mention of it in the chart notes.   MW please advise if ok to order sleep study. Thanks!   LOV  04/20/16  Patient Instructions     Stop dulera for now  Only use your albuterol as a rescue medication to be used if you can't catch your breath by resting or doing a relaxed purse lip breathing pattern.  - The less you use it, the better it will work when you need it. - Ok to use up to 2 puffs every 4 hours if you must but call for immediate appointment if use goes up over your usual need - Don't leave home without it !! (think of it like the spare tire for your car)   Prednisone 10 mg Take 4 for three days 3 for three days 2 for three days 1 for three days and stop   Nexium 40 mg Take 30- 60 min before your first and last meals of the day   For drainage / throat tickle/restriction try take CHLORPHENIRAMINE 4 mg - take one every 4 hours as needed - available over the counter- may cause drowsiness so start with just a bedtime dose or two and see how you tolerate it before trying in daytime    Please schedule a follow up office visit in 2 weeks, sooner if needed  Late add : Sinus CT needs to be done/ hold off augmentin for now

## 2016-05-23 NOTE — Telephone Encounter (Signed)
Ok, do split night study, dx daytime hypersomnolence

## 2016-05-24 NOTE — Telephone Encounter (Signed)
Spoke with pt. He is aware of MW's recommendation. Order has been placed for sleep study. Nothing further was needed at this time.

## 2016-05-24 NOTE — Telephone Encounter (Signed)
Would just do the PSG then and refer to sleep doc if positive to sort out what is really needed

## 2016-05-24 NOTE — Telephone Encounter (Signed)
Pt states that he was told by his ENT Dr Ernesto Rutherford that d/t his severe deviated septum that he will not be able to wear a CPAP d/t it being 80% blocked. Pt was advise that he will have to have his septum repaired prior to having his OSA treated if he is diagnosed and has to wear a CPAP device. Please advise Dr Melvyn Novas as his sleep study will be done partly on CPAP. Thanks.

## 2016-05-25 ENCOUNTER — Ambulatory Visit (HOSPITAL_BASED_OUTPATIENT_CLINIC_OR_DEPARTMENT_OTHER): Payer: PRIVATE HEALTH INSURANCE | Attending: Internal Medicine | Admitting: Pulmonary Disease

## 2016-05-25 VITALS — Ht 70.0 in | Wt 228.0 lb

## 2016-05-25 DIAGNOSIS — R0683 Snoring: Secondary | ICD-10-CM | POA: Insufficient documentation

## 2016-05-25 DIAGNOSIS — G4719 Other hypersomnia: Secondary | ICD-10-CM | POA: Insufficient documentation

## 2016-05-25 DIAGNOSIS — Z6833 Body mass index (BMI) 33.0-33.9, adult: Secondary | ICD-10-CM | POA: Diagnosis not present

## 2016-05-25 DIAGNOSIS — R5383 Other fatigue: Secondary | ICD-10-CM | POA: Insufficient documentation

## 2016-05-25 DIAGNOSIS — G4733 Obstructive sleep apnea (adult) (pediatric): Secondary | ICD-10-CM | POA: Insufficient documentation

## 2016-05-25 DIAGNOSIS — G4710 Hypersomnia, unspecified: Secondary | ICD-10-CM | POA: Diagnosis not present

## 2016-05-25 DIAGNOSIS — R400 Somnolence: Secondary | ICD-10-CM

## 2016-05-25 DIAGNOSIS — J342 Deviated nasal septum: Secondary | ICD-10-CM | POA: Diagnosis not present

## 2016-05-25 DIAGNOSIS — I10 Essential (primary) hypertension: Secondary | ICD-10-CM | POA: Insufficient documentation

## 2016-05-26 NOTE — Procedures (Signed)
Patient Name: Danzig, Helmkamp Date: 05/25/2016 Gender: Male D.O.B: [birth year] Age (years): 44 Referring Provider: Tanda Rockers Height (inches): 63 Interpreting Physician: Chesley Mires MD, ABSM Weight (lbs): 228 RPSGT: Laren Everts BMI: 33 MRN: [id number] Neck Size: 16.50  CLINICAL INFORMATION Sleep Study Type: NPSG   Indication for sleep study: Daytime Fatigue, Fatigue, Hypertension, Morbid Obesity.   Epworth Sleepiness Score: 10   SLEEP STUDY TECHNIQUE As per the AASM Manual for the Scoring of Sleep and Associated Events v2.3 (April 2016) with a hypopnea requiring 4% desaturations. The channels recorded and monitored were frontal, central and occipital EEG, electrooculogram (EOG), submentalis EMG (chin), nasal and oral airflow, thoracic and abdominal wall motion, anterior tibialis EMG, snore microphone, electrocardiogram, and pulse oximetry.  MEDICATIONS Patient's medications include: reviewed in electronic medical record. Medications self-administered by patient during sleep study : No sleep medicine administered.  SLEEP ARCHITECTURE The study was initiated at 9:47:01 PM and ended at 4:12:15 AM. Sleep onset time was 2.1 minutes and the sleep efficiency was 89.3%. The total sleep time was 344.0 minutes. Stage REM latency was 57.0 minutes. The patient spent 19.91% of the night in stage N1 sleep, 55.52% in stage N2 sleep, 0.00% in stage N3 and 24.56% in REM. Alpha intrusion was absent. Supine sleep was 41.86%.  RESPIRATORY PARAMETERS The overall apnea/hypopnea index (AHI) was 48.1 per hour. There were 64 total apneas, including 43 obstructive, 13 central and 8 mixed apneas. There were 212 hypopneas and 40 RERAs. The AHI during Stage REM sleep was 11.4 per hour. AHI while supine was 105.8 per hour. The mean oxygen saturation was 92.73%. The minimum SpO2 during sleep was 79.00%. Moderate snoring was noted during this study.  CARDIAC DATA The 2 lead EKG demonstrated  sinus rhythm. The mean heart rate was 62.41 beats per minute. Other EKG findings include: None.  LEG MOVEMENT DATA The total PLMS were 0 with a resulting PLMS index of 0.00. Associated arousal with leg movement index was 0.0 .  IMPRESSIONS This study shows severe obstructive sleep apnea with an AHI of 48.1 and SaO2 low of 79%.  Please note that the patient was not tried on CPAP due to history of severe nasal septal deviation and concern that he would not be able to tolerate CPAP therapy.  DIAGNOSIS - Obstructive Sleep Apnea (327.23 [G47.33 ICD-10])  RECOMMENDATIONS Given the severity of his sleep apnea and oxygen desaturation, he should be tried on CPAP therapy.  There could be difficulty with his ability to tolerate CPAP due to history of severe nasal septal deviation.  He could be tried on CPAP with mask that would allow for mouth breathing.  He could also have further assessment by ENT.  Alternatively he could be assessed for oral appliance, but CPAP would be better option.   Chesley Mires, MD, [place], American Board of Sleep Medicine 05/26/2016, 12:36 PM  NPI: [id number]

## 2016-06-07 ENCOUNTER — Telehealth: Payer: Self-pay | Admitting: Internal Medicine

## 2016-06-07 DIAGNOSIS — G4733 Obstructive sleep apnea (adult) (pediatric): Secondary | ICD-10-CM

## 2016-06-07 NOTE — Telephone Encounter (Signed)
Sleep study  05/25/16 > severe obstructive sleep apnea with an AHI of 48.1 and SaO2 low of 79%.> 05/26/2016 rec trial of cpap 10 cm as tolerated and formal sleep consult

## 2016-06-07 NOTE — Telephone Encounter (Signed)
Patient's wife received voicemail from Dr. Melvyn Novas stating that patient needs to be on CPAP therapy.  She wants to know how soon we can get this set up because their deductible has been met and starts back again on July1st so they are in a time crunch to get this done prior to July 1st.  Patient is scheduled for Deviated Septum surgery this Friday.    Dr. Melvyn Novas, please advise if we can go ahead and set up CPAP machine STAT and what pressure setting do you want to put patient on? Please advise.   Patient Instructions     Stop dulera for now  Only use your albuterol as a rescue medication to be used if you can't catch your breath by resting or doing a relaxed purse lip breathing pattern.  - The less you use it, the better it will work when you need it. - Ok to use up to 2 puffs every 4 hours if you must but call for immediate appointment if use goes up over your usual need - Don't leave home without it !! (think of it like the spare tire for your car)   Prednisone 10 mg Take 4 for three days 3 for three days 2 for three days 1 for three days and stop   Nexium 40 mg Take 30- 60 min before your first and last meals of the day   For drainage / throat tickle/restriction try take CHLORPHENIRAMINE 4 mg - take one every 4 hours as needed - available over the counter- may cause drowsiness so start with just a bedtime dose or two and see how you tolerate it before trying in daytime    Please schedule a follow up office visit in 2 weeks, sooner if needed  Late add : Sinus CT needs to be done/ hold off augmentin for now

## 2016-06-07 NOTE — Telephone Encounter (Signed)
Spoke with pt and his wife. They are aware of sleep study results. Order has been placed for CPAP therapy. Sleep Consult has been scheduled with AD on 08/19/16 at 11:30am. Nothing further was needed.

## 2016-06-11 ENCOUNTER — Other Ambulatory Visit: Payer: Self-pay | Admitting: Otolaryngology

## 2016-06-20 ENCOUNTER — Encounter: Payer: Self-pay | Admitting: Family Medicine

## 2016-07-21 ENCOUNTER — Encounter: Payer: Self-pay | Admitting: Family Medicine

## 2016-07-21 ENCOUNTER — Ambulatory Visit (INDEPENDENT_AMBULATORY_CARE_PROVIDER_SITE_OTHER): Payer: PRIVATE HEALTH INSURANCE | Admitting: Family Medicine

## 2016-07-21 VITALS — BP 120/80 | HR 89 | Temp 98.2°F | Ht 70.0 in | Wt 228.0 lb

## 2016-07-21 DIAGNOSIS — M5416 Radiculopathy, lumbar region: Secondary | ICD-10-CM | POA: Diagnosis not present

## 2016-07-21 NOTE — Progress Notes (Signed)
Subjective:     Patient ID: Jordan Buchanan, male   DOB: 1971-01-18, 45 y.o.   MRN: [id number]  HPI Patient seen as a work in with worsening left lumbar back pain and left radiculitis symptoms. Onset approximately 1 month ago. Denies any specific injury. About 2-3 weeks ago went to urgent care and reportedly had plain x-rays which show no acute abnormalities. Patient prescribed prednisone and tramadol and neither have heledp. He has taken Aleve without much improvement. He describes a sharp pain which somewhat intermittent and 7-8 out of 10 severity left lower lumbar with radiation down the left lower extremity toward the knee. He's had some progressive left anterior thigh numbness. No weakness. No loss of urine or stool control.  Pain is worse with changing positions and improved with lying supine. There was some question of prior history of ankylosing spondylitis but this is not definitive.  According to patient, no radiographic evidence of AS but did have positive HLA-B27.    He denies any prior back surgery.  Past Medical History:  Diagnosis Date  . Arrhythmia    PALPITATIONS AND PVC - improved with cutting down on caffeine  . Cough variant asthma 05/28/2014   05/27/2014 p extensive coaching HFA effectiveness =    75% > try dulera 100 2bid  - PFT's 07/03/2014 FEV1 4.15 (98%) ratio 89 and no chang p B2 and nl fef25-75  And nl dlco    . Diverticulosis 07/2015   by CT scan  . GERD (gastroesophageal reflux disease)   . Hepatic steatosis   . Hypertension    h/o LVH, resolved  . Migraines   . Seasonal allergies   . 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 Estanislado Pandy)  . Tremor    Past Surgical History:  Procedure Laterality Date  . NASAL RECONSTRUCTION WITH SEPTAL REPAIR  2017   [facility]  . OTHER SURGICAL HISTORY  1992   facial surgery  . ROTATOR CUFF REPAIR  2009   left  . UMBILICAL HERNIA REPAIR  05/10/2012   Procedure: HERNIA REPAIR UMBILICAL ADULT;  Surgeon:  Harl Bowie, MD;  Location: [facility];  Service: General;  Laterality: N/A;    reports that he quit smoking about 16 years ago. His smoking use included Cigarettes. He has a 13.00 pack-year smoking history. He quit smokeless tobacco use about 2 years ago. His smokeless tobacco use included Snuff. He reports that he uses drugs, including Marijuana. He reports that he does not drink alcohol. family history includes Allergies in his father; Arthritis in his father; Asthma in his father; CAD in his paternal grandfather; Chronic bronchitis in his father; Hypertension in his brother; Multiple sclerosis in his brother. No Known Allergies   Review of Systems  Constitutional: Negative for activity change, appetite change and fever.  Respiratory: Negative for cough and shortness of breath.   Cardiovascular: Negative for chest pain and leg swelling.  Gastrointestinal: Negative for abdominal pain and vomiting.  Genitourinary: Negative for dysuria, flank pain and hematuria.  Musculoskeletal: Positive for back pain. Negative for joint swelling.  Neurological: Positive for numbness. Negative for weakness.       Objective:   Physical Exam  Constitutional: He is oriented to person, place, and time. He appears well-developed and well-nourished. No distress.  Neck: No thyromegaly present.  Cardiovascular: Normal rate, regular rhythm and normal heart sounds.   No murmur heard. Pulmonary/Chest: Effort normal and breath sounds normal. No respiratory distress. He has  no wheezes. He has no rales.  Musculoskeletal: He exhibits no edema.  Straight leg raise are negative  Neurological: He is alert and oriented to person, place, and time. He has normal reflexes. No cranial nerve deficit.  Full-strength lower extremities. Symmetric reflexes knee and ankle bilaterally  Skin: No rash noted.  Decreased sensory to touch left thigh       Assessment:     Progressive left lumbar radiculitis  symptoms with progressive left anterior thigh numbness. Pain not improved with conservative treatment including prednisone, Ultram, and Aleve    Plan:     -Consider MRI lumbar spine to further assess -follow up promptly with primary for any weakness, progressive pain, or new neurologic deficits. -He has hx of anxiety and is requesting open MRI.  Eulas Post MD [facility]

## 2016-07-21 NOTE — Patient Instructions (Signed)
Lumbosacral Radiculopathy Lumbosacral radiculopathy is a condition that involves the spinal nerves and nerve roots in the low back and bottom of the spine. The condition develops when these nerves and nerve roots move out of place or become inflamed and cause symptoms. CAUSES This condition may be caused by:  Pressure from a disk that bulges out of place (herniated disk). A disk is a plate of cartilage that separates bones in the spine.  Disk degeneration.  A narrowing of the bones of the lower back (spinal stenosis).  A tumor.  An infection.  An injury that places sudden pressure on the disks that cushion the bones of your lower spine. RISK FACTORS This condition is more likely to develop in:  Males aged 30-50 years.  Females aged 34-60 years.  People who lift improperly.  People who are overweight or live a sedentary lifestyle.  People who smoke.  People who perform repetitive activities that strain the spine. SYMPTOMS Symptoms of this condition include:  Pain that goes down from the back into the legs (sciatica). This is the most common symptom. The pain may be worse with sitting, coughing, or sneezing.  Pain and numbness in the arms and legs.  Muscle weakness.  Tingling.  Loss of bladder control or bowel control. DIAGNOSIS This condition is diagnosed with a physical exam and medical history. If the pain is lasting, you may have tests, such as:  MRI scan.  X-ray.  CT scan.  Myelogram.  Nerve conduction study. TREATMENT This condition is often treated with:  Hot packs and ice applied to affected areas.  Stretches to improve flexibility.  Exercises to strengthen back muscles.  Physical therapy.  Pain medicine.  A steroid injection in the spine. In some cases, no treatment is needed. If the condition is long-lasting (chronic), or if symptoms are severe, treatment may involve surgery or lifestyle changes, such as following a weight loss plan. HOME  CARE INSTRUCTIONS Medicines  Take medicines only as directed by your health care provider.  Do not drive or operate heavy machinery while taking pain medicine. Injury Care  Apply a heat pack to the injured area as directed by your health care provider.  Apply ice to the affected area:  Put ice in a plastic bag.  Place a towel between your skin and the bag.  Leave the ice on for 20-30 minutes, every 2 hours while you are awake or as needed. Or, leave the ice on for as long as directed by your health care provider. Other Instructions  If you were shown how to do any exercises or stretches, do them as directed by your health care provider.  If your health care provider prescribed a diet or exercise program, follow it as directed.  Keep all follow-up visits as directed by your health care provider. This is important. SEEK MEDICAL CARE IF:  Your pain does not improve over time even when taking pain medicines. SEEK IMMEDIATE MEDICAL CARE IF:  Your develop severe pain.  Your pain suddenly gets worse.  You develop increasing weakness in your legs.  You lose the ability to control your bladder or bowel.  You have difficulty walking or balancing.  You have a fever.   This information is not intended to replace advice given to you by your health care provider. Make sure you discuss any questions you have with your health care provider.   Document Released: 12/06/2005 Document Revised: 04/22/2015 Document Reviewed: 12/02/2014 Elsevier Interactive Patient Education 2016 [employer].  We  will try to get MRI of lumbar spine arranged.

## 2016-08-01 ENCOUNTER — Ambulatory Visit
Admission: RE | Admit: 2016-08-01 | Discharge: 2016-08-01 | Disposition: A | Discharge status: Home / Self Care | Payer: PRIVATE HEALTH INSURANCE | Source: Ambulatory Visit | Attending: Family Medicine | Admitting: Family Medicine

## 2016-08-01 DIAGNOSIS — M5416 Radiculopathy, lumbar region: Secondary | ICD-10-CM

## 2016-08-06 ENCOUNTER — Encounter: Payer: Self-pay | Admitting: Family Medicine

## 2016-08-06 ENCOUNTER — Ambulatory Visit (INDEPENDENT_AMBULATORY_CARE_PROVIDER_SITE_OTHER): Payer: PRIVATE HEALTH INSURANCE | Admitting: Family Medicine

## 2016-08-06 VITALS — BP 124/88 | HR 84 | Temp 97.6°F | Wt 232.5 lb

## 2016-08-06 DIAGNOSIS — M545 Low back pain, unspecified: Secondary | ICD-10-CM | POA: Insufficient documentation

## 2016-08-06 DIAGNOSIS — M79605 Pain in left leg: Secondary | ICD-10-CM

## 2016-08-06 NOTE — Progress Notes (Signed)
Pre visit review using our clinic review tool, if applicable. No additional management support is needed unless otherwise documented below in the visit note. 

## 2016-08-06 NOTE — Progress Notes (Signed)
BP 124/88   Pulse 84   Temp 97.6 F (36.4 C) (Oral)   Wt 232 lb 8 oz (105.5 kg)   BMI 33.36 kg/m    CC: f/u MRI results Subjective:    Patient ID: Jordan Buchanan, male    DOB: 01/02/1971, 45 y.o.   MRN: [id number]  HPI: Jordan Buchanan is a 45 y.o. male presenting on 08/06/2016 for Follow-up (MRI)   Ongoing back pain for 3 months.  Seen by Dr Elease Hashimoto earlier this month with back pain - with progressive L lumbar radiculitis not improved with prednisone, tramadol, aleve. Also taking baclofen. Note reviewed. MRI showed small L foraminal disc protrusion at L4/5.   Last prednisone course by [facility] mid July. This did not help.  He has not undergone physical therapy.   Over last 3-4 wks progressively worsening L sided back pain, worse with bending over. Better with supine. Ongoing L lateral thigh paresthesias. No lower leg or foot symptoms. + L lateral thigh numbness. No pain, weakness, fevers/chills, bowel/bladder incontinence.  Relevant past medical, surgical, family and social history reviewed and updated as indicated. Interim medical history since our last visit reviewed. Allergies and medications reviewed and updated. Current Outpatient Prescriptions on File Prior to Visit  Medication Sig  . amLODipine (NORVASC) 10 MG tablet TAKE 1 TABLET (10 MG TOTAL) BY MOUTH DAILY.  . cetirizine (ZYRTEC) 10 MG tablet Take 10 mg by mouth at bedtime.   Marland Kitchen esomeprazole (NEXIUM) 40 MG capsule Take 30- 60 min before your first and last meals of the day  . fluticasone (FLONASE) 50 MCG/ACT nasal spray Place 2 sprays into both nostrils daily.  . metoprolol succinate (TOPROL-XL) 50 MG 24 hr tablet Take 1 tablet (50 mg total) by mouth daily.  . naproxen sodium (ANAPROX) 550 MG tablet Take 550 mg by mouth daily as needed (pain).  Marland Kitchen albuterol (VENTOLIN HFA) 108 (90 Base) MCG/ACT inhaler Inhale 2 puffs into the lungs every 4 (four) hours as needed for wheezing or shortness of breath. (Patient not  taking: Reported on 07/21/2016)   No current facility-administered medications on file prior to visit.     Review of Systems Per HPI unless specifically indicated in ROS section     Objective:    BP 124/88   Pulse 84   Temp 97.6 F (36.4 C) (Oral)   Wt 232 lb 8 oz (105.5 kg)   BMI 33.36 kg/m   Wt Readings from Last 3 Encounters:  08/06/16 232 lb 8 oz (105.5 kg)  07/21/16 228 lb (103.4 kg)  05/25/16 228 lb (103.4 kg)    Physical Exam  Constitutional: He appears well-developed and well-nourished. No distress.  Musculoskeletal: He exhibits no edema.  No pain midline spine Mild L sided paraspinous mm tenderness Neg SLR bilaterally. No pain with int/ext rotation at hip. Neg FABER. No pain at SIJ, GTB or sciatic notch bilaterally.   Neurological: He has normal strength. No sensory deficit.  5/5 strength BLE Sensation intact  Skin: Skin is warm and dry. No rash noted.  Psychiatric: He has a normal mood and affect.  Nursing note and vitals reviewed.     Assessment & Plan:   Problem List Items Addressed This Visit    Low back pain radiating to left lower extremity - Primary    MRI showing small L foraminal disc protrusion at L4/5 without neural foraminal stenosis. Likely causing discomfort. Failed conservative measures of tramadol, prednisone, aleve, baclofen. Discussed options - will refer to PT, continue  naprosyn and baclofen.  If no better with PT, will refer to spine center to discuss possible ESI.  Pt agrees with plan.      Relevant Orders   Ambulatory referral to Physical Therapy    Other Visit Diagnoses   None.      Follow up plan: Return if symptoms worsen or fail to improve.  Ria Bush, MD

## 2016-08-06 NOTE — Assessment & Plan Note (Signed)
MRI showing small L foraminal disc protrusion at L4/5 without neural foraminal stenosis. Likely causing discomfort. Failed conservative measures of tramadol, prednisone, aleve, baclofen. Discussed options - will refer to PT, continue naprosyn and baclofen.  If no better with PT, will refer to spine center to discuss possible ESI.  Pt agrees with plan.

## 2016-08-06 NOTE — Patient Instructions (Addendum)
MRI showing bulging disc.  We will send you to physical therapy - see our referral coordinators on the way out. Take naprosyn 500mg  twice daily with food for next 5 days then as needed.  If no better with above, let me know for referral to spine clinic.

## 2016-08-19 ENCOUNTER — Encounter: Payer: Self-pay | Admitting: Pulmonary Disease

## 2016-08-19 ENCOUNTER — Ambulatory Visit (INDEPENDENT_AMBULATORY_CARE_PROVIDER_SITE_OTHER): Payer: PRIVATE HEALTH INSURANCE | Admitting: Pulmonary Disease

## 2016-08-19 VITALS — BP 122/78 | HR 72 | Ht 70.0 in | Wt 233.0 lb

## 2016-08-19 DIAGNOSIS — E669 Obesity, unspecified: Secondary | ICD-10-CM

## 2016-08-19 DIAGNOSIS — J45991 Cough variant asthma: Secondary | ICD-10-CM

## 2016-08-19 DIAGNOSIS — G4733 Obstructive sleep apnea (adult) (pediatric): Secondary | ICD-10-CM

## 2016-08-19 NOTE — Assessment & Plan Note (Signed)
Currently stable. Off meds. Will observe. Sees Dr. Melvyn Novas for this.

## 2016-08-19 NOTE — Assessment & Plan Note (Signed)
Weight reduction 

## 2016-08-19 NOTE — Assessment & Plan Note (Signed)
Pt was dxed with severe OSA with a sleep lab study in 05/25/16 AHI 48. Started on cpap 10 cm water. DL the last month,  0% AHI 3.7.   He is not tolerating the cpap machine. Has a nasal mask. Very uncomfortable. Pt had sinus surgery last week of July > had septal deviation repair. Hard to exhale with nasal mask.   Pt still with hypersomnia, snoring, gasping, choking. Hypersomnia affects his functionality.  Plan :  We extensively discussed the importance of treating OSA and the need to use PAP therapy.   Continue with cpap 10 cm water. He is wearing comfortable with his current mask. He has a nasal mask. I suggest he try a full face mask or maybe Amara mask. Emphasized to him the importance of using CPAP machine given the severity. Also has leak issues which hopefully will improve with a new mask. Plan to get a download the next 30 days and hopefully he'll be compliant.   Patient was instructed to have mask, tubings, filter, reservoir cleaned at least once a week with soapy water.  Patient was instructed to call the office if he/she is having issues with the PAP device.    I advised patient to obtain sufficient amount of sleep --  7 to 8 hours at least in a 24 hr period.  Patient was advised to follow good sleep hygiene.  Patient was advised NOT to engage in activities requiring concentration and/or vigilance if he/she is and  sleepy.  Patient is NOT to drive if he/she is sleepy.

## 2016-08-19 NOTE — Patient Instructions (Signed)
  It was a pleasure taking care of you today!  Continue using your CPAP machine.  We will ask for a mask fitting session fror your Jordan Buchanan.  Please make sure you use your CPAP device everytime you sleep.  We will monitor the usage of your machine per your insurance requirement.  Your insurance company may take the machine from you if you are not using it regularly.   Please clean the mask, tubings, filter, water reservoir with soapy water every week.  Please use distilled water for the water reservoir.   Please call the office or your machine provider (DME company) if you are having issues with the device.   Return to clinic in 6 weeks with Dr. Corrie Dandy or NP.

## 2016-08-19 NOTE — Progress Notes (Signed)
Subjective:    Patient ID: Jordan Buchanan, male    DOB: 1971-09-13, 45 y.o.   MRN: [id number]  HPI  Pt is here as f/u on his OSA.  He was being seen by Dr. Melvyn Novas for cough variant asthma.  Cough is better and not an issue now.   Pt was dxed with severe OSA with a sleep lab study in 05/25/16 AHI 48. Started on cpap 10 cm water. DL the last month,  0% AHI 3.7.   He is not tolerating the cpap machine. Has a nasal mask. Very uncomfortable. Pt had sinus surgery last week of July > had septal deviation repair. Hard to exhale with nasal mask.   Pt still with hypersomnia, snoring, gasping, choking. Hypersomnia affects his functionality.   Review of Systems  Constitutional: Negative.  Negative for fever and unexpected weight change.  HENT: Positive for congestion, postnasal drip and rhinorrhea. Negative for dental problem, ear pain, nosebleeds, sinus pressure, sneezing, sore throat and trouble swallowing.   Eyes: Negative.  Negative for redness and itching.  Respiratory: Negative.  Negative for cough, chest tightness, shortness of breath and wheezing.   Cardiovascular: Negative.  Negative for palpitations and leg swelling.  Gastrointestinal: Negative.  Negative for nausea and vomiting.  Endocrine: Negative.   Genitourinary: Negative.  Negative for dysuria.  Musculoskeletal: Negative.  Negative for joint swelling.  Skin: Negative.  Negative for rash.  Allergic/Immunologic: Positive for environmental allergies.  Neurological: Negative.  Negative for headaches.  Hematological: Negative.  Does not bruise/bleed easily.  Psychiatric/Behavioral: Negative.  Negative for dysphoric mood. The patient is not nervous/anxious.    Past Medical History:  Diagnosis Date  . Arrhythmia    PALPITATIONS AND PVC - improved with cutting down on caffeine  . Cough variant asthma 05/28/2014   05/27/2014 p extensive coaching HFA effectiveness =    75% > try dulera 100 2bid  - PFT's 07/03/2014 FEV1 4.15 (98%) ratio 89 and  no chang p B2 and nl fef25-75  And nl dlco    . Diverticulosis 07/2015   by CT scan  . GERD (gastroesophageal reflux disease)   . Hepatic steatosis   . Hypertension    h/o LVH, resolved  . Migraines   . Seasonal allergies   . 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 Estanislado Pandy)  . Tremor      Family History  Problem Relation Age of Onset  . Asthma Father   . Hypertension Brother     obese  . CAD Paternal Grandfather     MI x2  . Multiple sclerosis Brother   . Arthritis Father     father, brother, Pgrandfather  . Stroke Neg Hx   . Cancer Neg Hx   . Chronic bronchitis Father   . Allergies Father      Past Surgical History:  Procedure Laterality Date  . NASAL RECONSTRUCTION WITH SEPTAL REPAIR  2017   [facility]  . OTHER SURGICAL HISTORY  1992   facial surgery  . ROTATOR CUFF REPAIR  2009   left  . UMBILICAL HERNIA REPAIR  05/10/2012   Procedure: HERNIA REPAIR UMBILICAL ADULT;  Surgeon: Harl Bowie, MD;  Location: [facility];  Service: General;  Laterality: N/A;    Social History   Social History  . Marital status: Married    Spouse name: N/A  . Number of children: 2  . Years of education: N/A   Occupational History  . golf course  maintenance [place] History Main Topics  . Smoking status: Former Smoker    Packs/day: 1.00    Years: 13.00    Types: Cigarettes    Quit date: 12/21/1999  . Smokeless tobacco: Former Systems developer    Types: Snuff    Quit date: 12/20/2013  . Alcohol use No     Comment: quit 2002  . Drug use:     Types: Marijuana     Comment: quit 2002  . Sexual activity: Not on file   Other Topics Concern  . 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 albuterol (VENTOLIN HFA) 108 (90 Base) MCG/ACT inhaler Inhale 2 puffs into the lungs every 4 (four) hours as needed for wheezing or shortness of breath. 1 Inhaler 6  . amLODipine (NORVASC) 10 MG tablet TAKE 1 TABLET (10 MG TOTAL) BY MOUTH DAILY. 90 tablet 3  . cetirizine (ZYRTEC) 10 MG tablet Take 10 mg by mouth at bedtime.     Marland Kitchen esomeprazole (NEXIUM) 40 MG capsule Take 30- 60 min before your first and last meals of the day 60 capsule 2  . fluticasone (FLONASE) 50 MCG/ACT nasal spray Place 2 sprays into both nostrils daily. 16 g 6  . metoprolol succinate (TOPROL-XL) 50 MG 24 hr tablet Take 1 tablet (50 mg total) by mouth daily. 90 tablet 3  . naproxen sodium (ANAPROX) 550 MG tablet Take 550 mg by mouth daily as needed (pain).     No facility-administered medications prior to visit.    No orders of the defined types were placed in this encounter.       Objective:   Physical Exam  Vitals:  Vitals:   08/19/16 1202  BP: 122/78  Pulse: 72  SpO2: 97%  Weight: 233 lb (105.7 kg)  Height: 5' 10"  (1.778 m)    Constitutional/General:  Pleasant, well-nourished, well-developed, not in any distress,  Comfortably seating.  Well kempt  Body mass index is 33.43 kg/m. Wt Readings from Last 3 Encounters:  08/19/16 233 lb (105.7 kg)  08/06/16 232 lb 8 oz (105.5 kg)  07/21/16 228 lb (103.4 kg)     HEENT: Pupils equal and reactive to light and accommodation. Anicteric sclerae. Normal nasal mucosa.   No oral  lesions,  mouth clear,  oropharynx clear, no postnasal drip. (-) Oral thrush. No dental caries.  Airway - Mallampati class III-IV  Neck: No masses. Midline trachea. No JVD, (-) LAD. (-) bruits appreciated.  Respiratory/Chest: Grossly normal chest. (-) deformity. (-) Accessory muscle use.  Symmetric expansion. (-) Tenderness on palpation.  Resonant on percussion.  Diminished BS on both lower lung zones. (-) wheezing, crackles,  rhonchi (-) egophony  Cardiovascular: Regular rate and  rhythm, heart sounds normal, no murmur or gallops, no peripheral edema  Gastrointestinal:  Normal bowel sounds. Soft, non-tender. No hepatosplenomegaly.  (-) masses.   Musculoskeletal:  Normal muscle tone. Normal gait.   Extremities: Grossly normal. (-) clubbing, cyanosis.  (-) edema  Skin: (-) rash,lesions seen.   Neurological/Psychiatric : alert, oriented to time, place, person. Normal mood and affect          Assessment &  Plan:  OSA (obstructive sleep apnea) Pt was dxed with severe OSA with a sleep lab study in 05/25/16 AHI 48. Started on cpap 10 cm water. DL the last month,  0% AHI 3.7.   He is not tolerating the cpap machine. Has a nasal mask. Very uncomfortable. Pt had sinus surgery last week of July > had septal deviation repair. Hard to exhale with nasal mask.   Pt still with hypersomnia, snoring, gasping, choking. Hypersomnia affects his functionality.  Plan :  We extensively discussed the importance of treating OSA and the need to use PAP therapy.   Continue with cpap 10 cm water. He is wearing comfortable with his current mask. He has a nasal mask. I suggest he try a full face mask or maybe Amara mask. Emphasized to him the importance of using CPAP machine given the severity. Also has leak issues which hopefully will improve with a new mask. Plan to get a download the next 30 days and hopefully he'll be compliant.   Patient was instructed to have mask, tubings, filter, reservoir cleaned at least once a week with soapy water.  Patient was instructed to call the office if he/she is having issues with the PAP device.    I advised patient to obtain sufficient amount of sleep --  7 to 8 hours at least in a 24 hr period.  Patient was advised to follow good sleep hygiene.  Patient was advised NOT to engage in activities requiring concentration and/or vigilance if he/she is and  sleepy.  Patient is NOT to drive if  he/she is sleepy.     Cough variant asthma Currently stable. Off meds. Will observe. Sees Dr. Melvyn Novas for this.   Obesity Weight reduction  Return to clinic in 4-6 weeks. Need to make sure patient is compliant is not having issues with CPAP otherwise, potentially can use his CPAP.    Monica Becton, MD 08/19/2016, 1:32 PM  Pulmonary and Critical Care Pager [phone] After 3 pm or if no answer, call [phone]

## 2016-08-24 ENCOUNTER — Ambulatory Visit (HOSPITAL_BASED_OUTPATIENT_CLINIC_OR_DEPARTMENT_OTHER): Payer: PRIVATE HEALTH INSURANCE | Attending: Pulmonary Disease

## 2016-08-24 DIAGNOSIS — G4733 Obstructive sleep apnea (adult) (pediatric): Secondary | ICD-10-CM

## 2016-09-14 ENCOUNTER — Encounter: Payer: Self-pay | Admitting: Family Medicine

## 2016-09-28 ENCOUNTER — Encounter: Payer: Self-pay | Admitting: Physical Therapy

## 2016-09-28 ENCOUNTER — Ambulatory Visit: Payer: PRIVATE HEALTH INSURANCE | Attending: Family Medicine | Admitting: Physical Therapy

## 2016-09-28 DIAGNOSIS — M62830 Muscle spasm of back: Secondary | ICD-10-CM | POA: Insufficient documentation

## 2016-09-28 DIAGNOSIS — M5442 Lumbago with sciatica, left side: Secondary | ICD-10-CM

## 2016-09-28 NOTE — Therapy (Signed)
[address] [place], [place], [address] Phone: [phone]   Fax:  [phone]  Physical Therapy Evaluation  Patient Details  Name: Jordan Buchanan MRN: [id number] Date of Birth: October 27, 1971 Referring Provider: Chryl Heck  Encounter Date: 09/28/2016      PT End of Session - 09/28/16 1117    Visit Number 1   Date for PT Re-Evaluation 11/28/16   PT Start Time 1046   PT Stop Time 1139   PT Time Calculation (min) 53 min   Activity Tolerance Patient tolerated treatment well   Behavior During Therapy [facility] for tasks assessed/performed      Past Medical History:  Diagnosis Date  . Arrhythmia    PALPITATIONS AND PVC - improved with cutting down on caffeine  . Cough variant asthma 05/28/2014   05/27/2014 p extensive coaching HFA effectiveness =    75% > try dulera 100 2bid  - PFT's 07/03/2014 FEV1 4.15 (98%) ratio 89 and no chang p B2 and nl fef25-75  And nl dlco    . Diverticulosis 07/2015   by CT scan  . GERD (gastroesophageal reflux disease)   . Hepatic steatosis   . Hypertension    h/o LVH, resolved  . Migraines   . Seasonal allergies   . 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 Estanislado Pandy)  . Tremor     Past Surgical History:  Procedure Laterality Date  . NASAL RECONSTRUCTION WITH SEPTAL REPAIR  2017   [facility]  . OTHER SURGICAL HISTORY  1992   facial surgery  . ROTATOR CUFF REPAIR  2009   left  . UMBILICAL HERNIA REPAIR  05/10/2012   Procedure: HERNIA REPAIR UMBILICAL ADULT;  Surgeon: Harl Bowie, MD;  Location: [facility];  Service: General;  Laterality: N/A;    There were no vitals filed for this visit.       Subjective Assessment - 09/28/16 1045    Subjective Patient reports a herniated disc L4-5, with left radicular symptoms, reports numbness for a year, he does have a diagnosis of ankylosing spondylitis.  Reports back pain worse over the  psat 6 months.   Limitations Standing   Patient Stated Goals have less pain, stand longer   Currently in Pain? Yes   Pain Score 6    Pain Location Back   Pain Orientation Lower;Left   Pain Descriptors / Indicators Aching;Numbness   Pain Type Chronic pain   Pain Onset More than a month ago   Pain Frequency Constant   Aggravating Factors  standing at soccer game with daughter pain will increase 9-10/10   Pain Relieving Factors changing position, ice, first thing in the morning pain can be down to 3/10   Effect of Pain on Daily Activities limits standing, worse as the day goes on            [facility] PT Assessment - 09/28/16 0001      Assessment   Medical Diagnosis LBP with left radiculopathy   Referring Provider Guiterez   Onset Date/Surgical Date 08/29/16   Prior Therapy no     Precautions   Precautions None     Balance Screen   Has the patient fallen in the past 6 months No   Has the patient had a decrease in activity level because of a fear of falling?  No   Is the patient reluctant to leave their home because of a fear of  falling?  No     Home Environment   Additional Comments yardwork     Prior Function   Level of Independence Independent   Vocation Full time employment   Vocation Requirements grounds maintenance   Leisure no exercise     Posture/Postural Control   Posture Comments fwd head, rounded shoulders     ROM / Strength   AROM / PROM / Strength AROM;Strength     AROM   Overall AROM Comments Lumbar ROM decreased 50% for flexion, other motions WFL's     Strength   Overall Strength Comments WNL's     Flexibility   Soft Tissue Assessment /Muscle Length --  tight HS and piriformis mms     Palpation   Palpation comment knot with tenderness in the left SI and lumbar area, spasms present, some numbness and tenderness in the left lateral thigh                   [facility] Adult PT Treatment/Exercise - 09/28/16 0001      Modalities   Modalities  Traction     Traction   Type of Traction Lumbar   Min (lbs) 75   Hold Time static   Time 15                PT Education - 09/28/16 1117    Education provided Yes   Education Details [place] exercises   Person(s) Educated Patient   Methods Explanation;Demonstration;Handout   Comprehension Verbalized understanding          PT Short Term Goals - 09/28/16 1124      PT SHORT TERM GOAL #1   Title independent with initial HEP   Time 1   Period Weeks   Status New           PT Long Term Goals - 09/28/16 1125      PT LONG TERM GOAL #1   Title understand proper posture and body mechanics   Time 8   Period Weeks   Status New     PT LONG TERM GOAL #2   Title decrease pain 50%   Time 8   Period Weeks   Status New     PT LONG TERM GOAL #3   Title tolerate standing for an hour   Time 8   Period Weeks   Status New     PT LONG TERM GOAL #4   Title increase lumbar ROM to WNL's   Time 8   Period Weeks   Status New               Plan - 09/28/16 1118    Clinical Impression Statement Patient with a herniated disc, he is having left leg pain and numbness, reports that this has been going on for about a year, reports that pain has been worse over the past few months with standing.  He is tight in the HS, has a large knot in the left SI area.  His job is on his feet doing yardwork and other tasks   Rehab Potential Good   PT Frequency 2x / week   PT Duration 8 weeks   PT Treatment/Interventions ADLs/Self Care Home Management;Cryotherapy;Electrical Stimulation;Moist Heat;Traction;Ultrasound;Therapeutic activities;Therapeutic exercise;Patient/family education;Manual techniques   PT Next Visit Plan May try pelvis stability and flexibility, see if traction helped   Consulted and Agree with Plan of Care Patient      Patient will benefit from skilled therapeutic intervention in order to improve the  following deficits and impairments:  Decreased activity  tolerance, Decreased range of motion, Difficulty walking, Increased fascial restricitons, Increased muscle spasms, Impaired flexibility, Improper body mechanics, Pain  Visit Diagnosis: Acute left-sided low back pain with left-sided sciatica - Plan: PT plan of care cert/re-cert  Muscle spasm of back - Plan: PT plan of care cert/re-cert     Problem List Patient Active Problem List   Diagnosis Date Noted  . OSA (obstructive sleep apnea) 08/19/2016  . Obesity 08/19/2016  . Low back pain radiating to left lower extremity 08/06/2016  . Morbid (severe) obesity due to excess calories ([place]) 04/21/2016  . Post-viral cough syndrome 03/10/2016  . Sty, internal 03/03/2016  . LLQ abdominal pain 07/31/2015  . Daytime somnolence 07/31/2015  . Neck pain on right side 01/13/2015  . Cough variant asthma 05/28/2014  . Pruritic condition 04/18/2014  . Right low back pain 09/18/2013  . Hepatic steatosis   . Prostatitis 05/09/2013  . Migraines   . Seasonal allergies   . GERD (gastroesophageal reflux disease)   . Arthritis   . Hypertension     Sumner Boast., PT 09/28/2016, 1:52 PM  [facility] [place], [place], [address] Phone: [phone]   Fax:  [phone]  Name: Jordan Buchanan MRN: [id number] Date of Birth: 01/01/1971

## 2016-10-01 ENCOUNTER — Ambulatory Visit: Payer: PRIVATE HEALTH INSURANCE | Admitting: Adult Health

## 2016-10-04 ENCOUNTER — Ambulatory Visit: Payer: PRIVATE HEALTH INSURANCE | Admitting: Physical Therapy

## 2016-10-08 ENCOUNTER — Encounter: Payer: Self-pay | Admitting: Physical Therapy

## 2016-10-08 ENCOUNTER — Ambulatory Visit: Payer: PRIVATE HEALTH INSURANCE | Admitting: Physical Therapy

## 2016-10-08 DIAGNOSIS — M5442 Lumbago with sciatica, left side: Secondary | ICD-10-CM | POA: Diagnosis not present

## 2016-10-08 DIAGNOSIS — M62830 Muscle spasm of back: Secondary | ICD-10-CM

## 2016-10-08 NOTE — Therapy (Signed)
[address] [place], [place], [address] Phone: [phone]   Fax:  [phone]  Physical Therapy Treatment  Patient Details  Name: Jordan Buchanan MRN: [id number] Date of Birth: November 24, 1971 Referring Provider: Chryl Heck  Encounter Date: 10/08/2016      PT End of Session - 10/08/16 1142    Visit Number 2   Date for PT Re-Evaluation 11/28/16   PT Start Time 1103   PT Stop Time 1157   PT Time Calculation (min) 54 min   Activity Tolerance Patient tolerated treatment well   Behavior During Therapy [facility] for tasks assessed/performed      Past Medical History:  Diagnosis Date  . Arrhythmia    PALPITATIONS AND PVC - improved with cutting down on caffeine  . Cough variant asthma 05/28/2014   05/27/2014 p extensive coaching HFA effectiveness =    75% > try dulera 100 2bid  - PFT's 07/03/2014 FEV1 4.15 (98%) ratio 89 and no chang p B2 and nl fef25-75  And nl dlco    . Diverticulosis 07/2015   by CT scan  . GERD (gastroesophageal reflux disease)   . Hepatic steatosis   . Hypertension    h/o LVH, resolved  . Migraines   . Seasonal allergies   . 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 Estanislado Pandy)  . Tremor     Past Surgical History:  Procedure Laterality Date  . NASAL RECONSTRUCTION WITH SEPTAL REPAIR  2017   [facility]  . OTHER SURGICAL HISTORY  1992   facial surgery  . ROTATOR CUFF REPAIR  2009   left  . UMBILICAL HERNIA REPAIR  05/10/2012   Procedure: HERNIA REPAIR UMBILICAL ADULT;  Surgeon: Harl Bowie, MD;  Location: [facility];  Service: General;  Laterality: N/A;    There were no vitals filed for this visit.      Subjective Assessment - 10/08/16 1107    Subjective Pt reports that he fells like the numbness and tingling has gotten worst over the last few days.   Currently in Pain? No/denies   Pain Score 0-No pain                          [facility] Adult PT Treatment/Exercise - 10/08/16 0001      Exercises   Exercises Lumbar;Knee/Hip     Lumbar Exercises: Aerobic   Stationary Bike NuStep 25lb 2x10     Lumbar Exercises: Machines for Strengthening   Cybex Knee Extension 10lb 2x10   Cybex Knee Flexion 25lb 2x10   Leg Press 20lb 2x10   Other Lumbar Machine Exercise Rows & lats 25lb 2x10     Modalities   Modalities Traction;Moist Heat     Moist Heat Therapy   Number Minutes Moist Heat 15 Minutes   Moist Heat Location Lumbar Spine     Traction   Type of Traction Lumbar   Min (lbs) 85   Hold Time static   Time 15                  PT Short Term Goals - 09/28/16 1124      PT SHORT TERM GOAL #1   Title independent with initial HEP   Time 1   Period Weeks   Status New           PT Long Term Goals - 09/28/16 1125  PT LONG TERM GOAL #1   Title understand proper posture and body mechanics   Time 8   Period Weeks   Status New     PT LONG TERM GOAL #2   Title decrease pain 50%   Time 8   Period Weeks   Status New     PT LONG TERM GOAL #3   Title tolerate standing for an hour   Time 8   Period Weeks   Status New     PT LONG TERM GOAL #4   Title increase lumbar ROM to WNL's   Time 8   Period Weeks   Status New               Plan - 10/08/16 1143    Clinical Impression Statement Pt tolerated an initial progression to exercise. Pt reports no pain just a constant numbness and tingling feeling in low back. Pt stated that she liked traction from last treatment so pull increase by 10 lb.   Rehab Potential Good   PT Frequency 2x / week   PT Duration 8 weeks   PT Next Visit Plan May try pelvis stability and flexibility, continue traction      Patient will benefit from skilled therapeutic intervention in order to improve the following deficits and impairments:  Decreased activity tolerance, Decreased range of motion, Difficulty walking,  Increased fascial restricitons, Increased muscle spasms, Impaired flexibility, Improper body mechanics, Pain  Visit Diagnosis: Acute left-sided low back pain with left-sided sciatica  Muscle spasm of back     Problem List Patient Active Problem List   Diagnosis Date Noted  . OSA (obstructive sleep apnea) 08/19/2016  . Obesity 08/19/2016  . Low back pain radiating to left lower extremity 08/06/2016  . Morbid (severe) obesity due to excess calories ([place]) 04/21/2016  . Post-viral cough syndrome 03/10/2016  . Sty, internal 03/03/2016  . LLQ abdominal pain 07/31/2015  . Daytime somnolence 07/31/2015  . Neck pain on right side 01/13/2015  . Cough variant asthma 05/28/2014  . Pruritic condition 04/18/2014  . Right low back pain 09/18/2013  . Hepatic steatosis   . Prostatitis 05/09/2013  . Migraines   . Seasonal allergies   . GERD (gastroesophageal reflux disease)   . Arthritis   . Hypertension     Scot Jun, PTA 10/08/2016, 11:46 AM  [address] [place], [place], [address] Phone: [phone]   Fax:  [phone]  Name: Maysen Sudol MRN: [id number] Date of Birth: [birth year]

## 2016-10-13 ENCOUNTER — Ambulatory Visit: Payer: PRIVATE HEALTH INSURANCE | Admitting: Physical Therapy

## 2016-10-13 DIAGNOSIS — M5442 Lumbago with sciatica, left side: Secondary | ICD-10-CM | POA: Diagnosis not present

## 2016-10-13 DIAGNOSIS — M62830 Muscle spasm of back: Secondary | ICD-10-CM

## 2016-10-13 NOTE — Therapy (Signed)
[address] [place], [place], [address] Phone: [phone]   Fax:  [phone]  Physical Therapy Treatment  Patient Details  Name: Jordan Buchanan MRN: [id number] Date of Birth: [birth year] Referring Provider: Chryl Heck  Encounter Date: 10/13/2016      PT End of Session - 10/13/16 0814    Visit Number 3   Date for PT Re-Evaluation 11/28/16   PT Start Time 0803   PT Stop Time 8413   PT Time Calculation (min) 54 min   Activity Tolerance Patient tolerated treatment well   Behavior During Therapy [facility] for tasks assessed/performed      Past Medical History:  Diagnosis Date  . Arrhythmia    PALPITATIONS AND PVC - improved with cutting down on caffeine  . Cough variant asthma 05/28/2014   05/27/2014 p extensive coaching HFA effectiveness =    75% > try dulera 100 2bid  - PFT's 07/03/2014 FEV1 4.15 (98%) ratio 89 and no chang p B2 and nl fef25-75  And nl dlco    . Diverticulosis 07/2015   by CT scan  . GERD (gastroesophageal reflux disease)   . Hepatic steatosis   . Hypertension    h/o LVH, resolved  . Migraines   . Seasonal allergies   . 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 Estanislado Pandy)  . Tremor     Past Surgical History:  Procedure Laterality Date  . NASAL RECONSTRUCTION WITH SEPTAL REPAIR  2017   [place]  . OTHER SURGICAL HISTORY  1992   facial surgery  . ROTATOR CUFF REPAIR  2009   left  . UMBILICAL HERNIA REPAIR  05/10/2012   Procedure: HERNIA REPAIR UMBILICAL ADULT;  Surgeon: Harl Bowie, MD;  Location: [place];  Service: General;  Laterality: N/A;    There were no vitals filed for this visit.      Subjective Assessment - 10/13/16 0805    Subjective Pt. reports the pain in the low back has been worse over the past two days.     Patient Stated Goals have less pain, stand longer   Currently in Pain? Yes   Pain Score 6    Pain  Location Back   Pain Orientation Lower;Left   Pain Descriptors / Indicators Aching;Numbness   Pain Type Chronic pain   Pain Onset More than a month ago   Pain Frequency Constant   Multiple Pain Sites No      Today's Treatment:   Therex:  NuStep: lvl. 3, 6 min  Hooklying abdominal bracing 3 x 10 for 5" hold   Hooklying brace march 2 x 15 reps each side  B sidelying clam shell with blue TB at knees x 10 reps each side   Manual:  L HS, piriformis stretch, SKTC x 1 min each; increased time on stretch due to muscular guarding         [facility] Adult PT Treatment/Exercise - 10/13/16 0850      Traction   Type of Traction Lumbar   Min (lbs) 85   Hold Time static    Time 15           PT Short Term Goals - 09/28/16 1124      PT SHORT TERM GOAL #1   Title independent with initial HEP   Time 1   Period Weeks   Status New           PT  Long Term Goals - 09/28/16 1125      PT LONG TERM GOAL #1   Title understand proper posture and body mechanics   Time 8   Period Weeks   Status New     PT LONG TERM GOAL #2   Title decrease pain 50%   Time 8   Period Weeks   Status New     PT LONG TERM GOAL #3   Title tolerate standing for an hour   Time 8   Period Weeks   Status New     PT LONG TERM GOAL #4   Title increase lumbar ROM to WNL's   Time 8   Period Weeks   Status New               Plan - 10/13/16 7939    Clinical Impression Statement Today's treatment focused on conservative pelvic stability activities in Hooklying and prone press ups.  No increase in LBP or radicular symptoms with these.  Mechanical traction continued at 85# which was well tolerated.     PT Treatment/Interventions ADLs/Self Care Home Management;Cryotherapy;Electrical Stimulation;Moist Heat;Traction;Ultrasound;Therapeutic activities;Therapeutic exercise;Patient/family education;Manual techniques   PT Next Visit Plan Continue pelvic stability per pt. tolerance; flexibility, continue  traction      Patient will benefit from skilled therapeutic intervention in order to improve the following deficits and impairments:  Decreased activity tolerance, Decreased range of motion, Difficulty walking, Increased fascial restricitons, Increased muscle spasms, Impaired flexibility, Improper body mechanics, Pain  Visit Diagnosis: Acute left-sided low back pain with left-sided sciatica  Muscle spasm of back     Problem List Patient Active Problem List   Diagnosis Date Noted  . OSA (obstructive sleep apnea) 08/19/2016  . Obesity 08/19/2016  . Low back pain radiating to left lower extremity 08/06/2016  . Morbid (severe) obesity due to excess calories ([place]) 04/21/2016  . Post-viral cough syndrome 03/10/2016  . Sty, internal 03/03/2016  . LLQ abdominal pain 07/31/2015  . Daytime somnolence 07/31/2015  . Neck pain on right side 01/13/2015  . Cough variant asthma 05/28/2014  . Pruritic condition 04/18/2014  . Right low back pain 09/18/2013  . Hepatic steatosis   . Prostatitis 05/09/2013  . Migraines   . Seasonal allergies   . GERD (gastroesophageal reflux disease)   . Arthritis   . Hypertension     Bess Harvest, [place] 10/13/16 8:57 AM  [address] [place], [place], [address] Phone: [phone]   Fax:  [phone]  Name: Andersen Iorio MRN: [id number] Date of Birth: 1971/01/19

## 2016-10-20 ENCOUNTER — Ambulatory Visit: Payer: PRIVATE HEALTH INSURANCE | Attending: Family Medicine | Admitting: Physical Therapy

## 2016-10-28 ENCOUNTER — Encounter: Payer: Self-pay | Admitting: Family Medicine

## 2016-10-28 ENCOUNTER — Ambulatory Visit (INDEPENDENT_AMBULATORY_CARE_PROVIDER_SITE_OTHER): Payer: PRIVATE HEALTH INSURANCE | Admitting: Family Medicine

## 2016-10-28 VITALS — BP 126/82 | HR 76 | Temp 98.3°F | Wt 232.8 lb

## 2016-10-28 DIAGNOSIS — M545 Low back pain: Secondary | ICD-10-CM | POA: Diagnosis not present

## 2016-10-28 DIAGNOSIS — M79605 Pain in left leg: Secondary | ICD-10-CM

## 2016-10-28 DIAGNOSIS — R82998 Other abnormal findings in urine: Secondary | ICD-10-CM

## 2016-10-28 DIAGNOSIS — R202 Paresthesia of skin: Secondary | ICD-10-CM | POA: Diagnosis not present

## 2016-10-28 DIAGNOSIS — M4690 Unspecified inflammatory spondylopathy, site unspecified: Secondary | ICD-10-CM

## 2016-10-28 DIAGNOSIS — M47819 Spondylosis without myelopathy or radiculopathy, site unspecified: Secondary | ICD-10-CM | POA: Insufficient documentation

## 2016-10-28 DIAGNOSIS — R8299 Other abnormal findings in urine: Secondary | ICD-10-CM

## 2016-10-28 LAB — POC URINALSYSI DIPSTICK (AUTOMATED)
Bilirubin, UA: NEGATIVE
Blood, UA: NEGATIVE
GLUCOSE UA: NEGATIVE
Ketones, UA: NEGATIVE
LEUKOCYTES UA: NEGATIVE
NITRITE UA: NEGATIVE
UROBILINOGEN UA: 0.2
pH, UA: 6

## 2016-10-28 LAB — CBC WITH DIFFERENTIAL/PLATELET
BASOS PCT: 0.4 % (ref 0.0–3.0)
Basophils Absolute: 0 10*3/uL (ref 0.0–0.1)
EOS PCT: 2.1 % (ref 0.0–5.0)
Eosinophils Absolute: 0.2 10*3/uL (ref 0.0–0.7)
HEMATOCRIT: 46.1 % (ref 39.0–52.0)
HEMOGLOBIN: 15.7 g/dL (ref 13.0–17.0)
Lymphocytes Relative: 27.6 % (ref 12.0–46.0)
Lymphs Abs: 2.5 10*3/uL (ref 0.7–4.0)
MCHC: 34 g/dL (ref 30.0–36.0)
MCV: 87.7 fl (ref 78.0–100.0)
MONOS PCT: 7.5 % (ref 3.0–12.0)
Monocytes Absolute: 0.7 10*3/uL (ref 0.1–1.0)
Neutro Abs: 5.6 10*3/uL (ref 1.4–7.7)
Neutrophils Relative %: 62.4 % (ref 43.0–77.0)
Platelets: 289 10*3/uL (ref 150.0–400.0)
RBC: 5.26 Mil/uL (ref 4.22–5.81)
RDW: 13.2 % (ref 11.5–15.5)
WBC: 9.1 10*3/uL (ref 4.0–10.5)

## 2016-10-28 LAB — VITAMIN B12: Vitamin B-12: 455 pg/mL (ref 211–911)

## 2016-10-28 LAB — COMPREHENSIVE METABOLIC PANEL
ALBUMIN: 4.2 g/dL (ref 3.5–5.2)
ALK PHOS: 60 U/L (ref 39–117)
ALT: 34 U/L (ref 0–53)
AST: 25 U/L (ref 0–37)
BUN: 10 mg/dL (ref 6–23)
CHLORIDE: 105 meq/L (ref 96–112)
CO2: 28 mEq/L (ref 19–32)
Calcium: 9.6 mg/dL (ref 8.4–10.5)
Creatinine, Ser: 0.96 mg/dL (ref 0.40–1.50)
GFR: 90.86 mL/min (ref >60.00)
Glucose, Bld: 95 mg/dL (ref 70–99)
POTASSIUM: 4 meq/L (ref 3.5–5.1)
SODIUM: 139 meq/L (ref 135–145)
TOTAL PROTEIN: 7 g/dL (ref 6.0–8.3)
Total Bilirubin: 1.2 mg/dL (ref 0.2–1.2)

## 2016-10-28 LAB — SEDIMENTATION RATE: Sed Rate: 6 mm/hr (ref 0–15)

## 2016-10-28 LAB — TSH: TSH: 0.74 u[IU]/mL (ref 0.35–4.50)

## 2016-10-28 MED ORDER — TRAMADOL HCL 50 MG PO TABS: 50.0000 mg | ORAL_TABLET | Freq: Two times a day (BID) | ORAL | 0 refills | Status: DC | PRN

## 2016-10-28 NOTE — Progress Notes (Signed)
BP 126/82   Pulse 76   Temp 98.3 F (36.8 C) (Oral)   Wt 232 lb 12 oz (105.6 kg)   BMI 33.40 kg/m    CC: back pain Subjective:    Patient ID: Jordan Buchanan, male    DOB: April 15, 1971, 45 y.o.   MRN: [id number]  HPI: Jordan Buchanan is a 45 y.o. male presenting on 10/28/2016 for Back Pain   Ongoing back pain for 5 months. Progressive L lumbar radiculitis with L lateral thigh paresthesias not improved with prednisone, tramadol, aleve, baclofen. MRI showed small L foraminal disc protrusion at L4/5 without foraminal stenosis. We referred patient to physical therapy 07/2016 - he has completed 3 sessions without significant improvement as well as home exercise program. Last PT session he felt something pop - pain worsened several days after this. We also started naprosyn '500mg'$  in am and tylenol '1000mg'$  TID without significant improvement.   Last several weeks noticing dark urine as well as stronger odor. Also noticing some swelling of legs. Nausea due to pain. Denies fevers/chills, bowel/bladder accidents, or saddle anesthesia.   H/o spondyloarthropathy prior on humira [facility]). Last saw rheum 6 months ago. Due for f/u.   Relevant past medical, surgical, family and social history reviewed and updated as indicated. Interim medical history since our last visit reviewed. Allergies and medications reviewed and updated. Current Outpatient Prescriptions on File Prior to Visit  Medication Sig  . amLODipine (NORVASC) 10 MG tablet TAKE 1 TABLET (10 MG TOTAL) BY MOUTH DAILY.  . cetirizine (ZYRTEC) 10 MG tablet Take 10 mg by mouth at bedtime.   Marland Kitchen esomeprazole (NEXIUM) 40 MG capsule Take 30- 60 min before your first and last meals of the day  . fluticasone (FLONASE) 50 MCG/ACT nasal spray Place 2 sprays into both nostrils daily.  . metoprolol succinate (TOPROL-XL) 50 MG 24 hr tablet Take 1 tablet (50 mg total) by mouth daily.  . naproxen sodium (ANAPROX) 550 MG tablet Take 550 mg by mouth daily as needed  (pain).  Marland Kitchen albuterol (VENTOLIN HFA) 108 (90 Base) MCG/ACT inhaler Inhale 2 puffs into the lungs every 4 (four) hours as needed for wheezing or shortness of breath. (Patient not taking: Reported on 10/28/2016)   No current facility-administered medications on file prior to visit.     Review of Systems Per HPI unless specifically indicated in ROS section     Objective:    BP 126/82   Pulse 76   Temp 98.3 F (36.8 C) (Oral)   Wt 232 lb 12 oz (105.6 kg)   BMI 33.40 kg/m   Wt Readings from Last 3 Encounters:  10/28/16 232 lb 12 oz (105.6 kg)  08/19/16 233 lb (105.7 kg)  08/06/16 232 lb 8 oz (105.5 kg)    Physical Exam  Constitutional: He appears well-developed and well-nourished. No distress.  Musculoskeletal: He exhibits no edema.  Tender midline mid lumbar spine  No paraspinous mm tenderness No pain at SIJ, GTB or sciatic notch bilaterally.   Psychiatric: He has a normal mood and affect. His behavior is normal.  Nursing note and vitals reviewed.      Assessment & Plan:   Problem List Items Addressed This Visit    Low back pain radiating to left lower extremity - Primary    Failed conservative management. Will check labs in h/o AS (ESR, TSH), and vitamin B12 level with endorsed lateral thigh paresthesia.  Discussed spine clinic referral to discuss further management ?ESI.  Discussed return to rheum.  Continue naprosyn, tylenol, add tramadol for breakthrough pain. Pt agrees with plan.      Relevant Medications   traMADol (ULTRAM) 50 MG tablet   Other Relevant Orders   Comprehensive metabolic panel   CBC with Differential/Platelet   TSH   Sedimentation rate   Spondyloarthropathy ([place])    Encouraged f/u with rheum. Will fax lab results to them per patient request.       Relevant Orders   Comprehensive metabolic panel   CBC with Differential/Platelet   TSH   Sedimentation rate    Other Visit Diagnoses    Dark urine       Paresthesia       Relevant Orders    Vitamin B12       Follow up plan: Return if symptoms worsen or fail to improve.  Ria Bush, MD

## 2016-10-28 NOTE — Patient Instructions (Addendum)
Follow up with Dr Estanislado Pandy - we will fax labs to her as well.  If labs ok, we will refer you to spine clinic to discuss possible steroid injection.  May use tramadol for breakthrough pain Labs today

## 2016-10-28 NOTE — Assessment & Plan Note (Addendum)
Failed conservative management. Will check labs in h/o AS (ESR, TSH), and vitamin B12 level with endorsed lateral thigh paresthesia.  Discussed spine clinic referral to discuss further management ?ESI.  Discussed return to rheum.  Continue naprosyn, tylenol, add tramadol for breakthrough pain. Pt agrees with plan.

## 2016-10-28 NOTE — Assessment & Plan Note (Addendum)
Encouraged f/u with rheum. Will fax lab results to them per patient request.

## 2016-10-28 NOTE — Addendum Note (Signed)
Addended by: Royann Shivers A on: 10/28/2016 02:03 PM   Modules accepted: Orders

## 2016-10-28 NOTE — Progress Notes (Signed)
Pre visit review using our clinic review tool, if applicable. No additional management support is needed unless otherwise documented below in the visit note. 

## 2016-10-29 ENCOUNTER — Other Ambulatory Visit: Payer: Self-pay | Admitting: Family Medicine

## 2016-10-29 DIAGNOSIS — M47819 Spondylosis without myelopathy or radiculopathy, site unspecified: Secondary | ICD-10-CM

## 2016-10-29 DIAGNOSIS — M545 Low back pain: Secondary | ICD-10-CM

## 2016-10-29 DIAGNOSIS — M79605 Pain in left leg: Secondary | ICD-10-CM

## 2017-02-16 ENCOUNTER — Ambulatory Visit (INDEPENDENT_AMBULATORY_CARE_PROVIDER_SITE_OTHER): Payer: PRIVATE HEALTH INSURANCE | Admitting: Family Medicine

## 2017-02-16 ENCOUNTER — Encounter: Payer: Self-pay | Admitting: *Deleted

## 2017-02-16 ENCOUNTER — Encounter: Payer: Self-pay | Admitting: Family Medicine

## 2017-02-16 VITALS — BP 128/88 | HR 118 | Temp 98.8°F | Wt 231.8 lb

## 2017-02-16 DIAGNOSIS — J111 Influenza due to unidentified influenza virus with other respiratory manifestations: Secondary | ICD-10-CM | POA: Diagnosis not present

## 2017-02-16 MED ORDER — GUAIFENESIN-CODEINE 100-10 MG/5ML PO SYRP: 5.0000 mL | ORAL_SOLUTION | Freq: Two times a day (BID) | ORAL | 0 refills | Status: DC | PRN

## 2017-02-16 MED ORDER — OSELTAMIVIR PHOSPHATE 75 MG PO CAPS: 75.0000 mg | ORAL_CAPSULE | Freq: Two times a day (BID) | ORAL | 0 refills | Status: DC

## 2017-02-16 NOTE — Progress Notes (Signed)
Pre visit review using our clinic review tool, if applicable. No additional management support is needed unless otherwise documented below in the visit note. 

## 2017-02-16 NOTE — Patient Instructions (Signed)
You have flu like illness - treat with tamiflu sent to pharmacy May use codeine cough syrup to suppress cough. Lots of hand washing to avoid spread. Cover mouth with coughing.    Influenza, Adult Influenza, more commonly known as "the flu," is a viral infection that primarily affects the respiratory tract. The respiratory tract includes organs that help you breathe, such as the lungs, nose, and throat. The flu causes many common cold symptoms, as well as a high fever and body aches. The flu spreads easily from person to person (is contagious). Getting a flu shot (influenza vaccination) every year is the best way to prevent influenza. What are the causes? Influenza is caused by a virus. You can catch the virus by:  Breathing in droplets from an infected person's cough or sneeze.  Touching something that was recently contaminated with the virus and then touching your mouth, nose, or eyes. What increases the risk? The following factors may make you more likely to get the flu:  Not cleaning your hands frequently with soap and water or alcohol-based hand sanitizer.  Having close contact with many people during cold and flu season.  Touching your mouth, eyes, or nose without washing or sanitizing your hands first.  Not drinking enough fluids or not eating a healthy diet.  Not getting enough sleep or exercise.  Being under a high amount of stress.  Not getting a yearly (annual) flu shot. You may be at a higher risk of complications from the flu, such as a severe lung infection (pneumonia), if you:  Are over the age of 2.  Are pregnant.  Have a weakened disease-fighting system (immune system). You may have a weakened immune system if you:  Have HIV or AIDS.  Are undergoing chemotherapy.  Aretaking medicines that reduce the activity of (suppress) the immune system.  Have a long-term (chronic) illness, such as heart disease, kidney disease, diabetes, or lung disease.  Have a  liver disorder.  Are obese.  Have anemia. What are the signs or symptoms? Symptoms of this condition typically last 4-10 days and may include:  Fever.  Chills.  Headache, body aches, or muscle aches.  Sore throat.  Cough.  Runny or congested nose.  Chest discomfort and cough.  Poor appetite.  Weakness or tiredness (fatigue).  Dizziness.  Nausea or vomiting. How is this diagnosed? This condition may be diagnosed based on your medical history and a physical exam. Your health care provider may do a nose or throat swab test to confirm the diagnosis. How is this treated? If influenza is detected early, you can be treated with antiviral medicine that can reduce the length of your illness and the severity of your symptoms. This medicine may be given by mouth (orally) or through an IV tube that is inserted in one of your veins. The goal of treatment is to relieve symptoms by taking care of yourself at home. This may include taking over-the-counter medicines, drinking plenty of fluids, and adding humidity to the air in your home. In some cases, influenza goes away on its own. Severe influenza or complications from influenza may be treated in a hospital. Follow these instructions at home:  Take over-the-counter and prescription medicines only as told by your health care provider.  Use a cool mist humidifier to add humidity to the air in your home. This can make breathing easier.  Rest as needed.  Drink enough fluid to keep your urine clear or pale yellow.  Cover your mouth and nose  when you cough or sneeze.  Wash your hands with soap and water often, especially after you cough or sneeze. If soap and water are not available, use hand sanitizer.  Stay home from work or school as told by your health care provider. Unless you are visiting your health care provider, try to avoid leaving home until your fever has been gone for 24 hours without the use of medicine.  Keep all  follow-up visits as told by your health care provider. This is important. How is this prevented?  Getting an annual flu shot is the best way to avoid getting the flu. You may get the flu shot in late summer, fall, or winter. Ask your health care provider when you should get your flu shot.  Wash your hands often or use hand sanitizer often.  Avoid contact with people who are sick during cold and flu season.  Eat a healthy diet, drink plenty of fluids, get enough sleep, and exercise regularly. Contact a health care provider if:  You develop new symptoms.  You have:  Chest pain.  Diarrhea.  A fever.  Your cough gets worse.  You produce more mucus.  You feel nauseous or you vomit. Get help right away if:  You develop shortness of breath or difficulty breathing.  Your skin or nails turn a bluish color.  You have severe pain or stiffness in your neck.  You develop a sudden headache or sudden pain in your face or ear.  You cannot stop vomiting. This information is not intended to replace advice given to you by your health care provider. Make sure you discuss any questions you have with your health care provider. Document Released: 12/03/2000 Document Revised: 05/13/2016 Document Reviewed: 09/30/2015 Elsevier Interactive Patient Education  2017 [employer].

## 2017-02-16 NOTE — Progress Notes (Signed)
BP 128/88   Pulse (!) 118   Temp 98.8 F (37.1 C) (Oral)   Wt 231 lb 12 oz (105.1 kg)   SpO2 97%   BMI 33.25 kg/m    CC: fever, body aches Subjective:    Patient ID: Jordan Buchanan, male    DOB: 1971-05-25, 46 y.o.   MRN: [id number]  HPI: Jordan Buchanan is a 46 y.o. male presenting on 02/16/2017 for Influenza (body aches,fever,fatigue,cough)   2d h/o body aches, mildly productive cough, low grade fever 100, congestion. Trouble sleeping last night. +HA.   No ear or tooth pain. No ST.  Pseudophed didn't help.   Daughter sick at home as well. H/o cough variant asthma. Non smoker  Relevant past medical, surgical, family and social history reviewed and updated as indicated. Interim medical history since our last visit reviewed. Allergies and medications reviewed and updated. Outpatient Medications Prior to Visit  Medication Sig Dispense Refill  . albuterol (VENTOLIN HFA) 108 (90 Base) MCG/ACT inhaler Inhale 2 puffs into the lungs every 4 (four) hours as needed for wheezing or shortness of breath. 1 Inhaler 6  . amLODipine (NORVASC) 10 MG tablet TAKE 1 TABLET (10 MG TOTAL) BY MOUTH DAILY. 90 tablet 3  . esomeprazole (NEXIUM) 40 MG capsule Take 30- 60 min before your first and last meals of the day 60 capsule 2  . fluticasone (FLONASE) 50 MCG/ACT nasal spray Place 2 sprays into both nostrils daily. 16 g 6  . metoprolol succinate (TOPROL-XL) 50 MG 24 hr tablet Take 1 tablet (50 mg total) by mouth daily. 90 tablet 3  . naproxen sodium (ANAPROX) 550 MG tablet Take 550 mg by mouth daily as needed (pain).    . cetirizine (ZYRTEC) 10 MG tablet Take 10 mg by mouth at bedtime.     . traMADol (ULTRAM) 50 MG tablet Take 1 tablet (50 mg total) by mouth 2 (two) times daily as needed. (Patient not taking: Reported on 02/16/2017) 30 tablet 0   No facility-administered medications prior to visit.      Per HPI unless specifically indicated in ROS section below Review of Systems     Objective:      BP 128/88   Pulse (!) 118   Temp 98.8 F (37.1 C) (Oral)   Wt 231 lb 12 oz (105.1 kg)   SpO2 97%   BMI 33.25 kg/m   Wt Readings from Last 3 Encounters:  02/16/17 231 lb 12 oz (105.1 kg)  10/28/16 232 lb 12 oz (105.6 kg)  08/19/16 233 lb (105.7 kg)    Physical Exam  Constitutional: He appears well-developed and well-nourished. No distress.  HENT:  Head: Normocephalic and atraumatic.  Right Ear: Hearing, tympanic membrane, external ear and ear canal normal.  Left Ear: Hearing, tympanic membrane, external ear and ear canal normal.  Nose: Mucosal edema (and erythema) and rhinorrhea present. Right sinus exhibits no maxillary sinus tenderness and no frontal sinus tenderness. Left sinus exhibits no maxillary sinus tenderness and no frontal sinus tenderness.  Mouth/Throat: Uvula is midline, oropharynx is clear and moist and mucous membranes are normal. No oropharyngeal exudate, posterior oropharyngeal edema, posterior oropharyngeal erythema or tonsillar abscesses.  Eyes: Conjunctivae and EOM are normal. Pupils are equal, round, and reactive to light. No scleral icterus.  Neck: Normal range of motion. Neck supple.  Cardiovascular: Normal rate, regular rhythm, normal heart sounds and intact distal pulses.   No murmur heard. Pulmonary/Chest: Effort normal and breath sounds normal. No respiratory distress. He has no  wheezes. He has no rales.  Cough present  Lymphadenopathy:    He has no cervical adenopathy.  Skin: Skin is warm and dry. No rash noted.  Nursing note and vitals reviewed.      Assessment & Plan:   Problem List Items Addressed This Visit    Influenza - Primary    Clinical diagnosis. Treat with tamiflu given cough variant asthma. cheratussin for cough as well. Supportive care reviewed, anticipated course of improvement reviewed. Red flags to seek further care discussed.      Relevant Medications   oseltamivir (TAMIFLU) 75 MG capsule       Follow up plan: Return if  symptoms worsen or fail to improve.  Ria Bush, MD

## 2017-02-16 NOTE — Assessment & Plan Note (Signed)
Clinical diagnosis. Treat with tamiflu given cough variant asthma. cheratussin for cough as well. Supportive care reviewed, anticipated course of improvement reviewed. Red flags to seek further care discussed.

## 2017-03-11 ENCOUNTER — Encounter (HOSPITAL_COMMUNITY): Payer: Self-pay | Admitting: *Deleted

## 2017-03-11 ENCOUNTER — Emergency Department (HOSPITAL_COMMUNITY): Payer: PRIVATE HEALTH INSURANCE

## 2017-03-11 ENCOUNTER — Emergency Department (HOSPITAL_COMMUNITY)
Admission: EM | Admit: 2017-03-11 | Discharge: 2017-03-11 | Disposition: A | Discharge status: Home / Self Care | Payer: PRIVATE HEALTH INSURANCE | Attending: Emergency Medicine | Admitting: Emergency Medicine

## 2017-03-11 ENCOUNTER — Telehealth: Payer: Self-pay | Admitting: Family Medicine

## 2017-03-11 ENCOUNTER — Other Ambulatory Visit: Payer: Self-pay | Admitting: Family Medicine

## 2017-03-11 DIAGNOSIS — R072 Precordial pain: Secondary | ICD-10-CM

## 2017-03-11 DIAGNOSIS — I10 Essential (primary) hypertension: Secondary | ICD-10-CM | POA: Insufficient documentation

## 2017-03-11 DIAGNOSIS — R058 Other specified cough: Secondary | ICD-10-CM

## 2017-03-11 DIAGNOSIS — G933 Postviral fatigue syndrome: Secondary | ICD-10-CM | POA: Diagnosis not present

## 2017-03-11 DIAGNOSIS — Z87891 Personal history of nicotine dependence: Secondary | ICD-10-CM | POA: Insufficient documentation

## 2017-03-11 DIAGNOSIS — R0789 Other chest pain: Secondary | ICD-10-CM | POA: Diagnosis present

## 2017-03-11 DIAGNOSIS — Z79899 Other long term (current) drug therapy: Secondary | ICD-10-CM | POA: Insufficient documentation

## 2017-03-11 DIAGNOSIS — R05 Cough: Secondary | ICD-10-CM | POA: Insufficient documentation

## 2017-03-11 DIAGNOSIS — R059 Cough, unspecified: Secondary | ICD-10-CM

## 2017-03-11 LAB — CBC
HCT: 43.7 % (ref 39.0–52.0)
HEMOGLOBIN: 15.4 g/dL (ref 13.0–17.0)
MCH: 30.3 pg (ref 26.0–34.0)
MCHC: 35.2 g/dL (ref 30.0–36.0)
MCV: 85.9 fL (ref 78.0–100.0)
Platelets: 262 10*3/uL (ref 150–400)
RBC: 5.09 MIL/uL (ref 4.22–5.81)
RDW: 13.1 % (ref 11.5–15.5)
WBC: 8.6 10*3/uL (ref 4.0–10.5)

## 2017-03-11 LAB — BASIC METABOLIC PANEL
ANION GAP: 9 (ref 5–15)
BUN: 6 mg/dL (ref 6–20)
CALCIUM: 8.9 mg/dL (ref 8.9–10.3)
CHLORIDE: 106 mmol/L (ref 101–111)
CO2: 23 mmol/L (ref 22–32)
Creatinine, Ser: 0.86 mg/dL (ref 0.61–1.24)
GFR calc non Af Amer: >60 mL/min (ref >60)
Glucose, Bld: 100 mg/dL — ABNORMAL HIGH (ref 65–99)
Potassium: 3.9 mmol/L (ref 3.5–5.1)
Sodium: 138 mmol/L (ref 135–145)

## 2017-03-11 LAB — D-DIMER, QUANTITATIVE (NOT AT ARMC)

## 2017-03-11 LAB — I-STAT TROPONIN, ED: TROPONIN I, POC: 0 ng/mL (ref 0.00–0.08)

## 2017-03-11 MED ORDER — HYDROCODONE-HOMATROPINE 5-1.5 MG/5ML PO SYRP: 5.0000 mL | ORAL_SOLUTION | Freq: Four times a day (QID) | ORAL | 0 refills | Status: DC | PRN

## 2017-03-11 MED ORDER — IPRATROPIUM-ALBUTEROL 0.5-2.5 (3) MG/3ML IN SOLN
3.0000 mL | Freq: Once | RESPIRATORY_TRACT | Status: AC
  Administered 2017-03-11: 3 mL via RESPIRATORY_TRACT
  Filled 2017-03-11: qty 3

## 2017-03-11 MED ORDER — BENZONATATE 100 MG PO CAPS: 200.0000 mg | ORAL_CAPSULE | Freq: Three times a day (TID) | ORAL | 0 refills | Status: DC | PRN

## 2017-03-11 NOTE — Discharge Instructions (Signed)
Your lab work, chest x-ray, EKG were all normal and reassuring today. We will treat your cough with Tessalon Perles and Hycodan syrup. Use of a humidifier may also help with cough. Continue taking your allergy medication and using Flonase. Monitor that your cough to ensure that it is not worsening and is not accompanied by fever. Return for worsening symptoms.    Even though you are low risk for heart disease, you have some risk factors (high blood pressure).  Given your recent chest discomfort I recommend that you follow-up with your primary care doctor for further discussion of today's visit. We recommend outpatient workup to rule out heart disease. Return to the emergency department if your chest discomfort is associated with shortness of breath, nausea, sweating or if it's worsened by exertion or activity.

## 2017-03-11 NOTE — ED Provider Notes (Signed)
[facility] DEPT Provider Note   CSN: [id number] Arrival date & time: 03/11/17  1403     History   Chief Complaint Chief Complaint  Patient presents with  . Chest Pain  . Cough    HPI Jordan Buchanan is a 46 y.o. male with pertinent pmh of HTN, GERD, obesity presents to ED with constant, non radiating, non exertional chest pressure, described as "uncomfortable", "antsy" sensation that started last night while at rest. Discomfort has persistent throughout last night and until arrival to ED. No associated shortness of breath, nausea, vomiting, diaphoresis, palpitations, dizziness. No previous h/o CAD, no family h/o of ACS at young age.  No previous h/o DVT/PE.    Patient also reports dry, disruptive cough that started on Monday and has been progressively worsening.  Patient reports 4 weeks ago he was diagnosed with the flu, his cough initially got better but came back four days ago.  Cough is non productive, non bloody.  Albuterol inhaler, symbicort and delsym have helped.  No fevers, myalgias, chills.   Patient reports right sided upper lip and eye lid twitching that started this morning.  No h/o same.  HPI  Past Medical History:  Diagnosis Date  . Arrhythmia    PALPITATIONS AND PVC - improved with cutting down on caffeine  . Cough variant asthma 05/28/2014   05/27/2014 p extensive coaching HFA effectiveness =    75% > try dulera 100 2bid  - PFT's 07/03/2014 FEV1 4.15 (98%) ratio 89 and no chang p B2 and nl fef25-75  And nl dlco    . Diverticulosis 07/2015   by CT scan  . GERD (gastroesophageal reflux disease)   . Hepatic steatosis   . Hypertension    h/o LVH, resolved  . Migraines   . Seasonal allergies   . 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 Estanislado Pandy)  . Tremor     Patient Active Problem List   Diagnosis Date Noted  . Influenza 02/16/2017  . Spondyloarthropathy ([place])   . OSA (obstructive sleep apnea) 08/19/2016  . Obesity 08/19/2016   . Low back pain radiating to left lower extremity 08/06/2016  . Morbid (severe) obesity due to excess calories ([place]) 04/21/2016  . Post-viral cough syndrome 03/10/2016  . Sty, internal 03/03/2016  . LLQ abdominal pain 07/31/2015  . Daytime somnolence 07/31/2015  . Neck pain on right side 01/13/2015  . Cough variant asthma 05/28/2014  . Pruritic condition 04/18/2014  . Right low back pain 09/18/2013  . Hepatic steatosis   . Prostatitis 05/09/2013  . Migraines   . Seasonal allergies   . GERD (gastroesophageal reflux disease)   . Hypertension     Past Surgical History:  Procedure Laterality Date  . NASAL RECONSTRUCTION WITH SEPTAL REPAIR  2017   [facility]  . OTHER SURGICAL HISTORY  1992   facial surgery  . ROTATOR CUFF REPAIR  2009   left  . UMBILICAL HERNIA REPAIR  05/10/2012   Procedure: HERNIA REPAIR UMBILICAL ADULT;  Surgeon: Harl Bowie, MD;  Location: [facility];  Service: General;  Laterality: N/A;       Home Medications    Prior to Admission medications   Medication Sig Start Date End Date Taking? Authorizing Provider  albuterol (VENTOLIN HFA) 108 (90 Base) MCG/ACT inhaler Inhale 2 puffs into the lungs every 4 (four) hours as needed for wheezing or shortness of breath. 03/03/16   Ria Bush, MD  amLODipine (NORVASC) 10 MG  tablet TAKE 1 TABLET (10 MG TOTAL) BY MOUTH DAILY. 03/03/16   Ria Bush, MD  benzonatate (TESSALON PERLES) 100 MG capsule Take 2 capsules (200 mg total) by mouth 3 (three) times daily as needed for cough. 03/11/17   Kinnie Feil, PA-C  cetirizine (ZYRTEC) 10 MG tablet Take 10 mg by mouth at bedtime.     Historical Provider, MD  esomeprazole (NEXIUM) 40 MG capsule Take 30- 60 min before your first and last meals of the day 04/20/16   Tanda Rockers, MD  fluticasone [facility]) 50 MCG/ACT nasal spray Place 2 sprays into both nostrils daily. 03/03/16   Ria Bush, MD  guaiFENesin-codeine [facility]) 100-10  MG/5ML syrup Take 5 mLs by mouth 2 (two) times daily as needed for cough (sedation precautions). 02/16/17   Ria Bush, MD  HYDROcodone-homatropine [facility]) 5-1.5 MG/5ML syrup Take 5 mLs by mouth every 6 (six) hours as needed for cough. FOR DISRUPTIVE NIGHT TIME COUGH AND BODY ACHES 03/11/17   Kinnie Feil, PA-C  metoprolol succinate (TOPROL-XL) 50 MG 24 hr tablet Take 1 tablet (50 mg total) by mouth daily. 03/03/16   Ria Bush, MD  naproxen sodium (ANAPROX) 550 MG tablet Take 550 mg by mouth daily as needed (pain).    Historical Provider, MD  oseltamivir (TAMIFLU) 75 MG capsule Take 1 capsule (75 mg total) by mouth 2 (two) times daily. 02/16/17   Ria Bush, MD  traMADol (ULTRAM) 50 MG tablet Take 1 tablet (50 mg total) by mouth 2 (two) times daily as needed. Patient not taking: Reported on 02/16/2017 10/28/16   Ria Bush, MD    Family History Family History  Problem Relation Age of Onset  . Asthma Father   . Arthritis Father     father, brother, Pgrandfather  . Chronic bronchitis Father   . Allergies Father   . Hypertension Brother     obese  . CAD Paternal Grandfather     MI x2  . Multiple sclerosis Brother   . Stroke Neg Hx   . Cancer Neg Hx     Social History Social History  Substance Use Topics  . Smoking status: Former Smoker    Packs/day: 1.00    Years: 13.00    Types: Cigarettes    Quit date: 12/21/1999  . Smokeless tobacco: Former Systems developer    Types: Snuff    Quit date: 12/20/2013  . Alcohol use No     Comment: quit 2002     Allergies   Patient has no known allergies.   Review of Systems Review of Systems  Constitutional: Negative for chills and fever.  HENT: Negative for congestion and sore throat.   Eyes: Negative for visual disturbance.  Respiratory: Positive for cough and chest tightness. Negative for shortness of breath.   Cardiovascular: Positive for chest pain. Negative for palpitations and leg swelling.  Gastrointestinal:  Negative for abdominal pain, blood in stool, constipation, diarrhea, nausea and vomiting.  Genitourinary: Negative for difficulty urinating, flank pain and hematuria.  Musculoskeletal: Negative for joint swelling and myalgias.  Skin: Negative for rash.  Neurological: Negative for dizziness, syncope, weakness, light-headedness and headaches.  Hematological: Negative.   Psychiatric/Behavioral: Negative.      Physical Exam Updated Vital Signs BP (!) 136/95 (BP Location: Right Arm)   Pulse 85   Temp 97.7 F (36.5 C) (Oral)   Resp 20   Ht 5' 10" (1.778 m)   Wt 102.1 kg   SpO2 94%   BMI 32.28 kg/m  Physical Exam  Constitutional: He is oriented to person, place, and time. He appears well-developed and well-nourished. No distress.  HENT:  Head: Normocephalic and atraumatic.  Nose: Nose normal.  Mouth/Throat: Oropharynx is clear and moist. No oropharyngeal exudate.  Occasional twitching of right upper lip. None seen in right eye lids. Twitching stops when patient is talking.  Eyes: Conjunctivae and EOM are normal. Pupils are equal, round, and reactive to light.  Neck: Normal range of motion. Neck supple. No JVD present.  Cardiovascular: Normal rate, regular rhythm, normal heart sounds and intact distal pulses.   No murmur heard. No new murmurs. No pleuritic rub. Chest wall non tender. No LE edema, erythema or warmth. No calf tenderness.  Pulmonary/Chest: Effort normal and breath sounds normal. No respiratory distress. He has no wheezes. He has no rales. He exhibits no tenderness.  Abdominal: Soft. Bowel sounds are normal. He exhibits no distension. There is no tenderness.  Musculoskeletal: Normal range of motion. He exhibits no deformity.  Neurological: He is alert and oriented to person, place, and time.  Pt is alert and oriented.   Speech and phonation normal.   Thought process coherent.    Sensation to light touch intact in upper and lower extremities. CN I not tested CN II  full visual fields  CN III, IV, VI PEERL and EOM intact CN V light touch intact in all 3 divisions of trigeminal nerve CN VII facial nerve movements intact, symmetric CN VIII hearing intact to finger rub CN IX, X no uvula deviation, symmetric soft palate rise CN XI 5/5 SCM and trapezius strength  CN XII Tongue midline with symmetric L/R movement  Skin: Skin is warm and dry. Capillary refill takes less than 2 seconds.  Psychiatric: He has a normal mood and affect. His behavior is normal. Judgment and thought content normal.  Nursing note and vitals reviewed.    ED Treatments / Results  Labs (all labs ordered are listed, but only abnormal results are displayed) Labs Reviewed  BASIC METABOLIC PANEL - Abnormal; Notable for the following:       Result Value   Glucose, Bld 100 (*)    All other components within normal limits  CBC  D-DIMER, QUANTITATIVE (NOT AT [facility])  Randolm Idol, ED    EKG  EKG Interpretation  Date/Time:  Friday March 11 2017 14:06:11 EDT Ventricular Rate:  98 PR Interval:  154 QRS Duration: 90 QT Interval:  330 QTC Calculation: 421 R Axis:   65 Text Interpretation:  Normal sinus rhythm ST & T wave abnormality, consider inferior ischemia ST & T wave abnormality, consider anterior ischemia Abnormal ECG Confirmed by WARD,  DO, KRISTEN ([id number]) on 03/12/2017 11:15:00 AM       Radiology Dg Chest 2 View  Result Date: 03/11/2017 CLINICAL DATA:  Former smoker/central cp/rapid heart rate/htn on meds/dry cough since flu 5 weeks ago EXAM: CHEST - 2 VIEW COMPARISON:  04/12/2016 FINDINGS: Relatively low lung volumes with crowding of perihilar and bibasilar bronchovascular markings as before. No focal infiltrate or overt edema. Heart size and mediastinal contours are within normal limits. No effusion. Visualized bones unremarkable. IMPRESSION: Low volumes.  No acute cardiopulmonary disease. Electronically Signed   By: Lucrezia Europe M.D.   On: 03/11/2017 14:30     Procedures Procedures (including critical care time)  Medications Ordered in ED Medications  ipratropium-albuterol (DUONEB) 0.5-2.5 (3) MG/3ML nebulizer solution 3 mL (3 mLs Nebulization Given 03/11/17 1630)     Initial Impression / Assessment and Plan /  ED Course  I have reviewed the triage vital signs and the nursing notes.  Pertinent labs & imaging results that were available during my care of the patient were reviewed by me and considered in my medical decision making (see chart for details).  Clinical Course as of Mar 13 1216  Fri Mar 11, 2017  1616 Temp: 97.7 F (36.5 C) [CG]  1616 Pulse Rate: 85 [CG]  1616 BP: 135/90 [CG]  1616 Resp: 18 [CG]  1616 SpO2: 97 % [CG]  1616 IMPRESSION: Low volumes. No acute cardiopulmonary disease. DG Chest 2 View [CG]  1617 WBC: 8.6 [CG]  1617 Hemoglobin: 15.4 [CG]  1617 Sodium: 138 [CG]  1617 Potassium: 3.9 [CG]  1617 Chloride: 106 [CG]  1617 Creatinine: 0.86 [CG]  1617 Troponin i, poc: 0.00 [CG]  1836 D-Dimer, Quant: <0.27 [CG]  Sun Mar 13, 2017  1204 Normal sinus rhythm ST & T wave abnormality, consider inferior ischemia ST & T wave abnormality, consider anterior ischemia Abnormal ECG ED EKG within 10 minutes [CG]    Clinical Course User Index [CG] Kinnie Feil, PA-C   Pt is a 46 y.o. male presents with CP. Pertinent risk factors include HTN and obesity.  On exam VS are within normal limits. RRR, symmetric pulses bilaterally, no new murmurs, no pleuritic rub.  CXR, EKG, troponin within normal limits.  CBC and BMP unremarkable.  Heart score = 2.  Pt was given duoneb x 1 in ED which improved chest discomfort and cough. PERC negative, d-dimer negative.  Patient is to be discharged with recommendation to follow up with PCP and cardiologist in regards to today's hospital visit. Advised patient that it is possible that chest pain could be due to cardiac etiology however given presentation, benign lab work and low risk HEART score,  patient is low risk and is safe for discharge for outpatient cardiac work up.  ED return precautions given.  Will treat cough with tessalon/hycodan syrup.  Likely post viral cough.  Pt appears reliable for follow up and is agreeable to discharge.   Final Clinical Impressions(s) / ED Diagnoses   Final diagnoses:  Cough  Precordial pain  Post-viral cough syndrome    New Prescriptions Discharge Medication List as of 03/11/2017  7:23 PM    START taking these medications   Details  benzonatate (TESSALON PERLES) 100 MG capsule Take 2 capsules (200 mg total) by mouth 3 (three) times daily as needed for cough., Starting Fri 03/11/2017, Print    HYDROcodone-homatropine (HYCODAN) 5-1.5 MG/5ML syrup Take 5 mLs by mouth every 6 (six) hours as needed for cough. FOR DISRUPTIVE NIGHT TIME COUGH AND BODY ACHES, Starting Fri 03/11/2017, Print         Kinnie Feil, PA-C 03/13/17 1217    Daleen Bo, MD 03/14/17 [phone]

## 2017-03-11 NOTE — Telephone Encounter (Signed)
Pt in the ER now

## 2017-03-11 NOTE — ED Triage Notes (Signed)
Pt reports feeling anxious, having mid chest pain and difficulty taking a deep breath. Reports twitching sensation to right side of face and recent cough. ekg done at triage. No resp distress noted.

## 2017-03-11 NOTE — Telephone Encounter (Signed)
Patient Name: ATREYU MAK DOB: Apr 21, 1971 Initial Comment Caller's husband blood pressure of 150/100 ad pulse 101. Pt. feels like his heart is beating outside his chest. Nurse Assessment Nurse: Lavera Guise, RN, Vaughan Basta Date/Time (Eastern Time): 03/11/2017 1:28:46 PM Confirm and document reason for call. If symptomatic, describe symptoms. ---Caller states husband came home and went to bed; blood pressure shot up today; taking b/p meds as always; muscles on right side of face has been twitching; said antsy feeling in chest started 2 weeks ago; husband blood pressure of 150/100 ad pulse 101. Pt. feels like his heart is beating outside his chest. Chest pain with exhalation; chest pain in center of chest; not radiating. 5/10 pain rating. Does the patient have any new or worsening symptoms? ---Yes Will a triage be completed? ---Yes Related visit to physician within the last 2 weeks? ---Yes Does the PT have any chronic conditions? (i.e. diabetes, asthma, etc.) ---Yes List chronic conditions. ---hypertension LVH Is this a behavioral health or substance abuse call? ---No Guidelines Guideline Title Affirmed Question Affirmed Notes Chest Pain [1] Chest pain lasts > 5 minutes AND [2] age > 23 AND [3] at least one cardiac risk factor (i.e., hypertension, diabetes, obesity, smoker or strong family history of heart disease) Final Disposition User Call EMS 911 Now Lavera Guise, RN, Vaughan Basta Disagree/Comply: Comply

## 2017-03-12 ENCOUNTER — Other Ambulatory Visit: Payer: Self-pay | Admitting: Family Medicine

## 2017-03-12 ENCOUNTER — Other Ambulatory Visit: Payer: Self-pay | Admitting: Rheumatology

## 2017-03-12 NOTE — Telephone Encounter (Signed)
Last office visit 02/16/17 for flu.  Last visit that addressed HTN 03/03/16.  Refill?

## 2017-03-12 NOTE — Telephone Encounter (Signed)
Reviewed ER documentation - plz call for f/u Monday and offer f/u ER appt if desired (ok for 15 min).

## 2017-03-14 NOTE — Telephone Encounter (Signed)
Spoke with patient. He said his CXR and EKG at the ER were fine. He was given cough meds and was told his cough is what was causing his symptoms. He said he wasn't as bad today, but still feels anxious. He is going to wait and see how he feels after getting some rest with the cough meds. He will call me back and let me know if he feels he needs to be seen.

## 2017-03-14 NOTE — Telephone Encounter (Signed)
Last Visit: 03/15/17 Next Visit was due October 2017. Message was sent to the front to schedule patient.  Okay to refill Nexium?

## 2017-03-17 ENCOUNTER — Telehealth: Payer: Self-pay | Admitting: Rheumatology

## 2017-03-17 NOTE — Telephone Encounter (Signed)
Called and spoke to patient's wife.  She is going to have him call us back to schedule his appointment.

## 2017-03-17 NOTE — Telephone Encounter (Signed)
-----   Message from Carole Binning, LPN sent at 9/79/1504  8:29 AM EDT ----- Regarding: Please schedule patient for follow up visit Please schedule patient for follow up visit. Patient was due October 2017. Thanks!

## 2017-03-29 ENCOUNTER — Telehealth: Payer: Self-pay | Admitting: Internal Medicine

## 2017-03-29 NOTE — Telephone Encounter (Signed)
Spoke with pt,and made him aware of MW message. Pt understood and will keep his appointment with TP tomorrow. Nothing further is needed

## 2017-03-29 NOTE — Telephone Encounter (Signed)
MW  Please Advise-  Pt called in requesting an OV due to concerns with his breathing, first available is with TP tomorrow which pt agreed to and the appointment was made. Pt wanted to know what should he do in the meantime until his appointment. He is c/o chest tightness,increase sob, feels a sharp pain when he exhales,having a non productive cough,slight wheeze,using his rescue inhaler more, pt states he does not feel like his Symbicort is helping him. Denies fever.    No Known Allergies

## 2017-03-29 NOTE — Telephone Encounter (Signed)
I have not seen since 04/2016 so no other suggestions I can make over the phone and if can't get comfortable at rest sitting still then will need to go to UC or ER

## 2017-03-30 ENCOUNTER — Ambulatory Visit (INDEPENDENT_AMBULATORY_CARE_PROVIDER_SITE_OTHER): Payer: PRIVATE HEALTH INSURANCE | Admitting: Adult Health

## 2017-03-30 ENCOUNTER — Encounter: Payer: Self-pay | Admitting: Adult Health

## 2017-03-30 DIAGNOSIS — R05 Cough: Secondary | ICD-10-CM

## 2017-03-30 DIAGNOSIS — J45991 Cough variant asthma: Secondary | ICD-10-CM | POA: Diagnosis not present

## 2017-03-30 DIAGNOSIS — R058 Other specified cough: Secondary | ICD-10-CM

## 2017-03-30 LAB — NITRIC OXIDE: NITRIC OXIDE: 5

## 2017-03-30 MED ORDER — FLUTICASONE PROPIONATE 50 MCG/ACT NA SUSP: 2.0000 | Freq: Every day | NASAL | 5 refills | Status: DC

## 2017-03-30 MED ORDER — PREDNISONE 10 MG PO TABS: ORAL_TABLET | ORAL | 0 refills | Status: DC

## 2017-03-30 MED ORDER — BUDESONIDE-FORMOTEROL FUMARATE 80-4.5 MCG/ACT IN AERO: 2.0000 | INHALATION_SPRAY | Freq: Two times a day (BID) | RESPIRATORY_TRACT | 5 refills | Status: DC

## 2017-03-30 MED ORDER — ALBUTEROL SULFATE HFA 108 (90 BASE) MCG/ACT IN AERS: 2.0000 | INHALATION_SPRAY | RESPIRATORY_TRACT | 5 refills | Status: DC | PRN

## 2017-03-30 NOTE — Progress Notes (Signed)
_0  ID: Jordan Buchanan, male    DOB: Jan 07, 1971, 46 y.o.   MRN: [id number]  Chief Complaint  Patient presents with  . Acute Visit    MW pt; increased SOB with cough-non productive. Wheezig as well. No fever or chills. Pt will need refills on inhalers. Flu about 1.5 to 2 months ago and has not gotten better. ER visit at [facility] recently-was given neb tx.    Referring provider: Ria Bush, MD  HPI: 46 year old white male, former smoker followed for asthma   TEST  PFT's 07/03/2014 FEV1 4.15 (98%) ratio 89 and no chang p B2 and nl fef25-75  And nl dlco  - NO during flare 04/12/2016  = 18 - Allergy profile 04/12/2016 >  Eos 0.2 /  IgE  25 neg RAST  - 04/12/2016  After extensive coaching HFA effectiveness =    75% > rechallenge with dulera 100 2bid samples> no better 04/20/2016 so d/cd  - Spirometry 04/20/2016  wnl including fef 25-75 - CT sinus 04/23/2016 > Minimal mucosal thickening within the maxillary sinuses. No evidence   03/30/2017 Acute OV : Asthma  Patient presents for an acute office visit. Patient complains of persistent cough and wheezing. Patient was seen in the emergency room ago. Rate, EKG and troponin were all within normal limits. D Dimer negative. Thought to have a viral cough was given Tessalon and Hycodan cough syrup and Neb treatment .  Says he had the Flu 2 months ago , has had a cough since then . Continues to have a cough , congestion, dyspnea, chest tightness, and wheezing .  Restarted Symbicort couple of weeks ago  No GERD sx , on Nexium daily.  Works on Office manager course . Takes zyrtec .daily .      No Known Allergies  Immunization History  Administered Date(s) Administered  . Influenza,inj,Quad PF,36+ Mos 09/17/2013  . Influenza-Unspecified 09/19/2014, 10/30/2015, 10/01/2016    Past Medical History:  Diagnosis Date  . Arrhythmia    PALPITATIONS AND PVC - improved with cutting down on caffeine  . Cough variant asthma 05/28/2014   05/27/2014 p extensive  coaching HFA effectiveness =    75% > try dulera 100 2bid  - PFT's 07/03/2014 FEV1 4.15 (98%) ratio 89 and no chang p B2 and nl fef25-75  And nl dlco    . Diverticulosis 07/2015   by CT scan  . GERD (gastroesophageal reflux disease)   . Hepatic steatosis   . Hypertension    h/o LVH, resolved  . Migraines   . Seasonal allergies   . 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 Estanislado Pandy)  . Tremor     Tobacco History: History  Smoking Status  . Former Smoker  . Packs/day: 1.00  . Years: 13.00  . Types: Cigarettes  . Quit date: 12/21/1999  Smokeless Tobacco  . Former Systems developer  . Types: Snuff  . Quit date: 12/20/2013   Counseling given: Not Answered   Outpatient Encounter Prescriptions as of 03/30/2017  Medication Sig  . albuterol (VENTOLIN HFA) 108 (90 Base) MCG/ACT inhaler Inhale 2 puffs into the lungs every 4 (four) hours as needed for wheezing or shortness of breath.  Marland Kitchen amLODipine (NORVASC) 10 MG tablet TAKE 1 TABLET (10 MG TOTAL) BY MOUTH DAILY.  . cetirizine (ZYRTEC) 10 MG tablet Take 10 mg by mouth at bedtime.   Marland Kitchen esomeprazole (NEXIUM) 40 MG capsule TAKE 1 CAPSULE EVERY DAY BY MOUTH  . fluticasone (FLONASE) 50  MCG/ACT nasal spray Place 2 sprays into both nostrils daily.  . metoprolol succinate (TOPROL-XL) 50 MG 24 hr tablet TAKE 1 TABLET (50 MG TOTAL) BY MOUTH DAILY.  . naproxen sodium (ANAPROX) 550 MG tablet Take 550 mg by mouth daily as needed (pain).  . [DISCONTINUED] guaiFENesin-codeine (CHERATUSSIN AC) 100-10 MG/5ML syrup Take 5 mLs by mouth 2 (two) times daily as needed for cough (sedation precautions).  . [DISCONTINUED] HYDROcodone-homatropine (HYCODAN) 5-1.5 MG/5ML syrup Take 5 mLs by mouth every 6 (six) hours as needed for cough. FOR DISRUPTIVE NIGHT TIME COUGH AND BODY ACHES  . [DISCONTINUED] oseltamivir (TAMIFLU) 75 MG capsule Take 1 capsule (75 mg total) by mouth 2 (two) times daily.  . [DISCONTINUED] traMADol (ULTRAM) 50 MG tablet Take 1  tablet (50 mg total) by mouth 2 (two) times daily as needed.  . predniSONE (DELTASONE) 10 MG tablet 4 tabs for 2 days, then 3 tabs for 2 days, 2 tabs for 2 days, then 1 tab for 2 days, then stop  . [DISCONTINUED] benzonatate (TESSALON PERLES) 100 MG capsule Take 2 capsules (200 mg total) by mouth 3 (three) times daily as needed for cough. (Patient not taking: Reported on 03/30/2017)   No facility-administered encounter medications on file as of 03/30/2017.      Review of Systems  Constitutional:   No  weight loss, night sweats,  Fevers, chills,  +fatigue, or  lassitude.  HEENT:   No headaches,  Difficulty swallowing,  Tooth/dental problems, or  Sore throat,                No sneezing, itching, ear ache +, nasal congestion, post nasal drip,   CV:  No chest pain,  Orthopnea, PND, swelling in lower extremities, anasarca, dizziness, palpitations, syncope.   GI  No heartburn, indigestion, abdominal pain, nausea, vomiting, diarrhea, change in bowel habits, loss of appetite, bloody stools.   Resp:   No chest wall deformity  Skin: no rash or lesions.  GU: no dysuria, change in color of urine, no urgency or frequency.  No flank pain, no hematuria   MS:  No joint pain or swelling.  No decreased range of motion.  No back pain.    Physical Exam  BP 128/78 (BP Location: Left Arm, Cuff Size: Normal)   Pulse 82   Temp 97.6 F (36.4 C) (Oral)   Ht 5' 10" (1.778 m)   Wt 232 lb 3.2 oz (105.3 kg)   SpO2 97%   BMI 33.32 kg/m   GEN: A/Ox3; pleasant , NAD obese    HEENT:  [place]/AT,  EACs-clear, TMs-wnl, NOSE-clear, THROAT-clear, no lesions, no postnasal drip or exudate noted.   NECK:  Supple w/ fair ROM; no JVD; normal carotid impulses w/o bruits; no thyromegaly or nodules palpated; no lymphadenopathy.  No Stridor   RESP  Few trace exp wheezes on forced exp. . no accessory muscle use, no dullness to percussion. Speaks in full sentences ,   CARD:  RRR, no m/r/g, no peripheral edema, pulses  intact, no cyanosis or clubbing.  GI:   Soft & nt; nml bowel sounds; no organomegaly or masses detected.   Musco: Warm bil, no deformities or joint swelling noted.   Neuro: alert, no focal deficits noted.    Skin: Warm, no lesions or rashes    Lab Results:  CBC    Component Value Date/Time   WBC 8.6 03/11/2017 1415   RBC 5.09 03/11/2017 1415   HGB 15.4 03/11/2017 1415   HCT 43.7 03/11/2017  1415   PLT 262 03/11/2017 1415   MCV 85.9 03/11/2017 1415   MCH 30.3 03/11/2017 1415   MCHC 35.2 03/11/2017 1415   RDW 13.1 03/11/2017 1415   LYMPHSABS 2.5 10/28/2016 1042   MONOABS 0.7 10/28/2016 1042   EOSABS 0.2 10/28/2016 1042   BASOSABS 0.0 10/28/2016 1042    BMET    Component Value Date/Time   NA 138 03/11/2017 1415   K 3.9 03/11/2017 1415   CL 106 03/11/2017 1415   CO2 23 03/11/2017 1415   GLUCOSE 100 (H) 03/11/2017 1415   BUN 6 03/11/2017 1415   CREATININE 0.86 03/11/2017 1415   CALCIUM 8.9 03/11/2017 1415   GFRNONAA >60 03/11/2017 1415   GFRAA >60 03/11/2017 1415    BNP No results found for: BNP  ProBNP No results found for: PROBNP  Imaging: Dg Chest 2 View  Result Date: 03/11/2017 CLINICAL DATA:  Former smoker/central cp/rapid heart rate/htn on meds/dry cough since flu 5 weeks ago EXAM: CHEST - 2 VIEW COMPARISON:  04/12/2016 FINDINGS: Relatively low lung volumes with crowding of perihilar and bibasilar bronchovascular markings as before. No focal infiltrate or overt edema. Heart size and mediastinal contours are within normal limits. No effusion. Visualized bones unremarkable. IMPRESSION: Low volumes.  No acute cardiopulmonary disease. Electronically Signed   By: Lucrezia Europe M.D.   On: 03/11/2017 14:30     Assessment & Plan:   Post-viral cough syndrome Flare -slow to resolve   Plan  Patient Instructions  Restart Symbicort 2 puffs Twice daily  , rinse after use Continue on Zyrtec 10m daily  Mucinex Twice daily  As needed  Cough/congestion  Delsym 2 tsp  .Twice daily  As needed   Prednisone taper over next week.  Flonase 2 puffs daily  follow up Dr. WMelvyn Novas In 4-6 weeks and As needed  Please contact office for sooner follow up if symptoms do not improve or worsen or seek emergency care      Cough variant asthma Flare  FENO   Plan  Patient Instructions  Restart Symbicort 2 puffs Twice daily  , rinse after use Continue on Zyrtec 140mdaily  Mucinex Twice daily  As needed  Cough/congestion  Delsym 2 tsp .Twice daily  As needed   Prednisone taper over next week.  Flonase 2 puffs daily  follow up Dr. WeMelvyn NovasIn 4-6 weeks and As needed  Please contact office for sooner follow up if symptoms do not improve or worsen or seek emergency care         TaRexene EdisonNP 03/30/2017

## 2017-03-30 NOTE — Assessment & Plan Note (Signed)
Flare -slow to resolve   Plan  Patient Instructions  Restart Symbicort 2 puffs Twice daily  , rinse after use Continue on Zyrtec 10mg  daily  Mucinex Twice daily  As needed  Cough/congestion  Delsym 2 tsp .Twice daily  As needed   Prednisone taper over next week.  Flonase 2 puffs daily  follow up Dr. Melvyn Novas  In 4-6 weeks and As needed  Please contact office for sooner follow up if symptoms do not improve or worsen or seek emergency care

## 2017-03-30 NOTE — Assessment & Plan Note (Signed)
Flare  FENO   Plan  Patient Instructions  Restart Symbicort 2 puffs Twice daily  , rinse after use Continue on Zyrtec 10mg  daily  Mucinex Twice daily  As needed  Cough/congestion  Delsym 2 tsp .Twice daily  As needed   Prednisone taper over next week.  Flonase 2 puffs daily  follow up Dr. Melvyn Novas  In 4-6 weeks and As needed  Please contact office for sooner follow up if symptoms do not improve or worsen or seek emergency care

## 2017-03-30 NOTE — Progress Notes (Signed)
Chart and office note reviewed in detail  > agree with a/p as outlined    

## 2017-03-30 NOTE — Patient Instructions (Addendum)
Continue on Symbicort 2 puffs Twice daily  , rinse after use Continue on Zyrtec 10mg  daily  Mucinex Twice daily  As needed  Cough/congestion  Delsym 2 tsp .Twice daily  As needed   Prednisone taper over next week.  Flonase 2 puffs daily  follow up Dr. Melvyn Novas  In 4-6 weeks and As needed  Please contact office for sooner follow up if symptoms do not improve or worsen or seek emergency care

## 2017-03-30 NOTE — Addendum Note (Signed)
Addended by: Parke Poisson E on: 03/30/2017 01:16 PM   Modules accepted: Orders

## 2017-04-04 ENCOUNTER — Telehealth: Payer: Self-pay | Admitting: Internal Medicine

## 2017-04-04 NOTE — Telephone Encounter (Signed)
Ov with all meds in hand  If declines to return now > Prednisone 10 mg take  4 each am x 2 days,   2 each am x 2 days,  1 each am x 2 days and stop then ov with all meds in hand

## 2017-04-04 NOTE — Telephone Encounter (Signed)
Spoke with the pt  He was seen by TP for acute ov on 03/30/17:   Plan  Patient Instructions  Restart Symbicort 2 puffs Twice daily  , rinse after use Continue on Zyrtec 10mg  daily  Mucinex Twice daily  As needed  Cough/congestion  Delsym 2 tsp .Twice daily  As needed   Prednisone taper over next week.  Flonase 2 puffs daily  follow up Dr. Melvyn Novas  In 4-6 weeks and As needed  Please contact office for sooner follow up if symptoms do not improve or worsen or seek emergency care   He states first 2 days after starting pred his symptoms were better, but now that he is tapering down he feels increased chest tightness, and increased non prod cough. He is asking if we can call in more pred. Please advise thanks

## 2017-04-04 NOTE — Telephone Encounter (Signed)
Spoke with the pt and notified of recs per MW  OV tomorrow at 12  Pt aware to bring all meds in hand

## 2017-04-05 ENCOUNTER — Ambulatory Visit (INDEPENDENT_AMBULATORY_CARE_PROVIDER_SITE_OTHER): Payer: PRIVATE HEALTH INSURANCE | Admitting: Internal Medicine

## 2017-04-05 ENCOUNTER — Encounter: Payer: Self-pay | Admitting: Internal Medicine

## 2017-04-05 VITALS — BP 134/90 | HR 86 | Ht 70.0 in | Wt 234.0 lb

## 2017-04-05 DIAGNOSIS — J45991 Cough variant asthma: Secondary | ICD-10-CM | POA: Diagnosis not present

## 2017-04-05 LAB — NITRIC OXIDE: Nitric Oxide: 8

## 2017-04-05 MED ORDER — TRAMADOL HCL 50 MG PO TABS: ORAL_TABLET | ORAL | 0 refills | Status: DC

## 2017-04-05 MED ORDER — ESOMEPRAZOLE MAGNESIUM 40 MG PO CPDR: 40.0000 mg | DELAYED_RELEASE_CAPSULE | Freq: Two times a day (BID) | ORAL | 1 refills | Status: DC

## 2017-04-05 MED ORDER — PREDNISONE 10 MG PO TABS: ORAL_TABLET | ORAL | 0 refills | Status: DC

## 2017-04-05 NOTE — Patient Instructions (Addendum)
Prednisone 10 mg take  4 each am x 2 days,   2 each am x 2 days,  1 each am x 2 days and stop   Stop symbicort   Only use your albuterol as a rescue medication to be used if you can't catch your breath by resting or doing a relaxed purse lip breathing pattern.  - The less you use it, the better it will work when you need it. - Ok to use up to 2 puffs  every 4 hours if you must but call for immediate appointment if use goes up over your usual need - Don't leave home without it !!  (think of it like the spare tire for your car)    Take delsym two tsp every 12 hours and supplement if needed with  tramadol 50 mg up to 2 every 4 hours to suppress the urge to cough. Swallowing water or using ice chips/non mint and menthol containing candies (such as lifesavers or sugarless jolly ranchers) are also effective.  You should rest your voice and avoid activities that you know make you cough.  Once you have eliminated the cough for 3 straight days try reducing the tramadol first,  then the delsym as tolerated.     nexium 40 mg Take 30- 60 min before your first and last meals of the day until no cough or tightness for at least two week then resume previous dose    GERD (REFLUX)  is an extremely common cause of respiratory symptoms just like yours , many times with no obvious heartburn at all.    It can be treated with medication, but also with lifestyle changes including elevation of the head of your bed (ideally with 6 inch  bed blocks),  Smoking cessation, avoidance of late meals, excessive alcohol, and avoid fatty foods, chocolate, peppermint, colas, red wine, and acidic juices such as orange juice.  NO MINT OR MENTHOL PRODUCTS SO NO COUGH DROPS   USE SUGARLESS CANDY INSTEAD (Jolley ranchers or Stover's or Life Savers) or even ice chips will also do - the key is to swallow to prevent all throat clearing. NO OIL BASED VITAMINS - use powdered substitutes.   If not 100% better in 2 weeks return with all  medications

## 2017-04-05 NOTE — Progress Notes (Signed)
Subjective:   Patient ID: Jordan Buchanan, male    DOB: 1971-11-13   MRN: [id number]   Brief patient profile:  72 yowm quit smoking 2001  with ? pna 4/5th grade but completely back to nl = competitive sports and some sob while smoking but not a big deal, had some tendency to sinus infections in last decade attributed to outdoor greenskeeping but in early May 2015 onset of nasal congestion/ sore throat > eval by Dr Leo Grosser with neg strep test rx with augmentin some better then severe cough then rx zpak / pred x 2 then worse in ER June 5th 2015 rx with albuterol by Dr Jeanell Sparrow and referred to pulmonary clinic 05/27/14     History of Present Illness  05/27/2014 1st [facility] office visit/ Zayneb Baucum  Re cough x early May 2015  Chief Complaint  Patient presents with  . Pulmonary Consult    Self referral. Pt c/o SOB and cough for the past month. Cough is mainly dry, but produces minimal sputum in the am's- ? color.  He is using ventolin HFA every 4 hrs.    on prilosec daily then eat 4 h later, ventolin helps transiently with cough and breathing  >dulera , PPI and delsym , steroid taper    07/03/2014 f/u ov/Saranda Legrande re: ?cough variant / rx gerd rx plus  dulera 100 2bid, no need for saba  Chief Complaint  Patient presents with  . Follow-up    Pt reports that his cough is much improved. Rash has resolved. No new co's today.   Not limited by breathing from desired activities    Not needing any cough suppression rec dulera 100 to up 2 puffs every 12 hours, ok to adjust down and off if doing great.  F/u prn   04/12/2016  Acute extended  Ov/ -re-establish Malin Cervini re: ? Cough variant asthma vs uacs / springtime rhinitis on multiple otc  Chief Complaint  Patient presents with  . Acute Visit    Pt c/o CP on exhalation and non prod cough x 8 wks. He also c/o rash on his stomach and legs- itchy.   tapered dulera off then months later  Cough flared  Around Jan 2017  >  insurance symbicort 80 2bid  Wasn't  sure it helped it then bad URI > much worse cough  then increased the symbicort to 160 x 3 weeks and no better.  Wakes up nl hours then 30 min later starts cough dry hacking to point of heaving worst time of day is before brfast then cough drops help some but has to stay on them all day until hs then stays quiet all night if takes a cough drop at hs  nexium was already maint pior to flare/ new rx added = singulair but no better.  Rash "appears every spring x sev years/ "derm thinks contact dermatitis" rec Plan A = Automatic = dulera 100 Take 2 puffs first thing in am and then another 2 puffs about 12 hours later until return  Work on inhaler technique:  Plan B = Backup Only use your albuterol (ventolin) as a rescue medication Prednisone 10 mg take  4 each am x 2 days,  2 each am x 2 days,  1 each am x 2 days and stop    Change Nexium  Take 30- 60 min before your first and last meals of the day  GERD diet  Take delsym two tsp every 12 hours and supplement if needed with  tramadol 50 mg up to 2 every 4 hours to suppress the urge to cough.   Once you have eliminated the cough for 3 straight days try reducing the tramadol first,  then the delsym as tolerated.       04/20/2016 acute extended ov/Jarae Panas re: shrill cough since January 2017 no better back on dulera 100 2 bid maint singulair also   Chief Complaint  Patient presents with  . Acute Visit    Increased coughing x1 week. Finished prednisone pack from last week. Cough returned after this.  cough much worse day than night/ mostly dry/ gone completely with prednisone then recurred w/in a day of stopping and is very harsh/  Shrill dry upper airway assoc with sensation of globus = strangling sensation but does not disturb sleep.   rec Stop dulera for now Only use your albuterol as a rescue medication Prednisone 10 mg Take 4 for three days 3 for three days 2 for three days 1 for three days and stop  Nexium 40 mg Take 30- 60 min before your first and  last meals of the day  For drainage / throat tickle/restriction  try take CHLORPHENIRAMINE  4 mg - take one every 4 hours as needed - available over the counter- may cause drowsiness so start with just a bedtime dose or two and see how you tolerate it before trying in daytime   Please schedule a follow up office visit in 2  weeks, sooner if needed  Late add :  Sinus CT needs to be done/  hold off augmentin for now  June 2017 Sinus Surgery Dr Ernesto Rutherford for nasal obst > corrected  Mid feb 2018 acutely ill viral syndrome > severe coughing since maint on symb 160 2bid   03/30/17  NP Continue on Symbicort 2 puffs Twice daily  , rinse after use Continue on Zyrtec 10mg  daily  Mucinex Twice daily  As needed  Cough/congestion  Delsym 2 tsp .Twice daily  As needed   Prednisone taper over next week.  Flonase 2 puffs daily     04/05/2017  f/u ov/Breyton Vanscyoc re: refractory cough/ chest tightness only better on high dose pred/ maint on symb 160 / no longer on gerd rx  Chief Complaint  Patient presents with  . Acute Visit    Increased chest tightness since tapering down on prednisone. He states his chest feels "restricted".     was 100% better p my last ov with him in 04/2016 and fine until acutely ill in Feb 2018 in setting of viral illness not maintained on any asthma maint intrx in interim  Not back to baseline since flu in [facility] Feb 2018 with sensation of globus/chest tightness day > noct and no better even with saba   No obvious day to day or daytime variability or assoc excess/ purulent sputum or mucus plugs or hemoptysis or cp  subjective wheeze or overt sinus or hb symptoms. No unusual exp hx or h/o childhood pna/ asthma or knowledge of premature birth.  Sleeping ok when he uses his cpap, which is about 2x weekly,  without nocturnal  or early am exacerbation  of respiratory  c/o's or need for noct saba. Also denies any obvious fluctuation of symptoms with weather or environmental changes or other  aggravating or alleviating factors except as outlined above   Current Medications, Allergies, Complete Past Medical History, Past Surgical History, Family History, and Social History were reviewed in [employer] record.  ROS  The  following are not active complaints unless bolded sore throat, dysphagia, dental problems, itching, sneezing,  nasal congestion or excess/ purulent secretions, ear ache,   fever, chills, sweats, unintended wt loss, classically pleuritic or exertional cp,  orthopnea pnd or leg swelling, presyncope, palpitations, abdominal pain, anorexia, nausea, vomiting, diarrhea  or change in bowel or bladder habits, change in stools or urine, dysuria,hematuria,  rash, arthralgias, visual complaints, headache, numbness, weakness or ataxia or problems with walking or coordination,  change in mood/affect or memory.                    Objective:   Physical Exam   amb wm nad with active symptoms of chest tight/ points to suprasternal notch where feels blocked    04/05/2017       234 04/20/2016         231  04/12/16 236 lb (107.049 kg)  04/01/16 231 lb 8 oz (105.008 kg)  03/10/16 233 lb 9.6 oz (105.96 kg)    Vital signs reviewed  - - Note on arrival 02 sats  98% on RA       HEENT: nl dentition  and orophanx. Nl external ear canals without cough reflex - moderate  bilateral non-specific turbinate edema    NECK :  without JVD/Nodes/TM/ nl carotid upstrokes bilaterally   LUNGS: no acc muscle use,  Perfectly clear to A and P bilaterally without reproducible cough on insp or exp maneuvers   CV:  RRR  no s3 or murmur or increase in P2, no edema   ABD:  soft and nontender with nl excursion in the supine position. No bruits or organomegaly, bowel sounds nl  MS:  warm without deformities, calf tenderness, cyanosis or clubbing  SKIN: no rash  NEURO:  alert, approp, no deficits    I personally reviewed images and agree with radiology impression as  follows:  CXR:   03/11/17  Low volumes.  No acute cardiopulmonary disease.            Assessment & Plan:

## 2017-04-06 NOTE — Assessment & Plan Note (Signed)
05/27/2014  try dulera 100 2bid  - PFT's 07/03/2014 FEV1 4.15 (98%) ratio 89 and no chang p B2 and nl fef25-75  And nl dlco  - NO during flare 04/12/2016  = 18 - Allergy profile 04/12/2016 >  Eos 0.2 /  IgE  25 neg RAST  - 04/12/2016  After extensive coaching HFA effectiveness =    75% > rechallenge with dulera 100 2bid samples> no better 04/20/2016 so d/cd  - Spirometry 04/20/2016  wnl including fef 25-75  - CT sinus 04/23/2016 > Minimal mucosal thickening within the maxillary sinuses. No evidence of acute sinusitis. - 04/05/2017  FENO = 8 on symb 160 2bid  - Spirometry 04/05/2017  wnl including curvature with active symptoms   Despite reported partial response to pred and active symptoms, there is no evidence at all of asthma now and this fits much better dx of Upper airway cough syndrome (previously labeled PNDS) , is  so named because it's frequently impossible to sort out how much is  CR/sinusitis with freq throat clearing (which can be related to primary GERD)   vs  causing  secondary (" extra esophageal")  GERD from wide swings in gastric pressure that occur with throat clearing, often  promoting self use of mint and menthol lozenges that reduce the lower esophageal sphincter tone and exacerbate the problem further in a cyclical fashion.   These are the same pts (now being labeled as having "irritable larynx syndrome" by some cough centers) who not infrequently have a history of having failed to tolerate ace inhibitors,  dry powder inhalers or biphosphonates or report having atypical/extraesophageal reflux symptoms that don't respond to standard doses of PPI  and are easily confused as having aecopd or asthma flares by even experienced allergists/ pulmonologists (myself included).  Of the three most common causes of  Sub-acute or recurrent or chronic cough, only one (GERD)  can actually contribute to/ trigger  the other two (asthma and post nasal drip syndrome)  and perpetuate the cylce of cough.  While  not intuitively obvious, many patients with chronic low grade reflux do not cough until there is a primary insult that disturbs the protective epithelial barrier and exposes sensitive nerve endings.   This is typically viral which fits here  but can be direct physical injury such as with an endotracheal tube.   The point is that once this occurs, it is difficult to eliminate the cycle  using anything but a maximally effective acid suppression regimen at least in the short run, accompanied by an appropriate diet to address non acid GERD and control / eliminate the cough itself for at least 3 days.   rec max rx directed at gerd/ cyclical cough then return in 2 weeks if not better   I had an extended discussion with the patient reviewing all relevant studies completed to date and  lasting 25 minutes of a 40  minute re-establish/ acute office visit re severe     non-specific but potentially very serious refractory respiratory symptoms of unknown etiology.  Each maintenance medication was reviewed in detail including most importantly the difference between maintenance and prns and under what circumstances the prns are to be triggered using an action plan format that is not reflected in the computer generated alphabetically organized AVS.    Please see AVS for specific instructions unique to this office visit that I personally wrote and verbalized to the the pt in detail and then reviewed with pt  by my nurse  highlighting any changes in therapy/plan of care  recommended at today's visit.

## 2017-04-06 NOTE — Assessment & Plan Note (Signed)
Body mass index is 33.58 kg/m.  -  trending up Lab Results  Component Value Date   TSH 0.74 10/28/2016     Contributing to gerd risk/ doe/reviewed the need and the process to achieve and maintain neg calorie balance > defer f/u primary care including intermittently monitoring thyroid status

## 2017-04-19 ENCOUNTER — Other Ambulatory Visit: Payer: Self-pay | Admitting: Rheumatology

## 2017-04-20 NOTE — Telephone Encounter (Signed)
Last Visit: 03/15/16 Next Visit was due October 2017. Message sent to the front to schedule patient.   Okay to refill Nexium?

## 2017-04-27 DIAGNOSIS — Z820 Family history of epilepsy and other diseases of the nervous system: Secondary | ICD-10-CM | POA: Insufficient documentation

## 2017-04-27 DIAGNOSIS — Z87891 Personal history of nicotine dependence: Secondary | ICD-10-CM | POA: Insufficient documentation

## 2017-04-27 NOTE — Progress Notes (Signed)
Office Visit Note  Patient: Jordan Buchanan             Date of Birth: [birth year]           MRN: [id number]             PCP: Ria Bush, MD Referring: Ria Bush, MD Visit Date: 04/28/2017 Occupation: @GUAROCC @    Subjective:  Pain in shoulder, hands and low back.   History of Present Illness: [name] is a 46 y.o. male returns after his last visit in March 2017. He had been under my care since 2013. 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 Dr. Benjamine Mola at [facility] did not agree with the diagnosis. His radiographic examination was unremarkable. He was tried on Humira and had good response to Humira for a while. When he came to see me we decided to keep him off Humira because of family history of MS and is present. He was treated only with Naprosyn 500 mg twice a day and tolerated it pretty well. He had very infrequent visits.  According to patient she's been having discomfort in his right shoulder joint which is been diagnosed as possible labral tear. He is MRI pending. He had a cortisone injection to his right shoulder joint by Dr. Theda Sers. He's been also having discomfort in his bilateral hands. He has noticed swelling in his hands. He continues to have some lower back pain. He complains of tingling on the lateral aspect of his left thigh which is been going on for about a year now.  Activities of Daily Living:  Patient reports morning stiffness for 0 minutes.   Patient Reports nocturnal pain.  Difficulty dressing/grooming: Denies Difficulty climbing stairs: Reports Difficulty getting out of chair: Reports Difficulty using hands for taps, buttons, cutlery, and/or writing: Denies   Review of Systems  Constitutional: Positive for fatigue and weakness. Negative for night sweats, weight gain and weight loss.  HENT: Negative for mouth sores, mouth dryness and nose dryness.   Eyes: Negative for pain, itching, visual disturbance and dryness.   Respiratory: Positive for cough. Negative for shortness of breath and difficulty breathing.   Cardiovascular: Positive for hypertension. Negative for chest pain, palpitations, irregular heartbeat and swelling in legs/feet.  Gastrointestinal: Negative for blood in stool, constipation and diarrhea.  Endocrine: Negative for increased urination.  Genitourinary: Negative for painful urination.  Musculoskeletal: Positive for arthralgias, joint pain and joint swelling. Negative for myalgias, morning stiffness and myalgias.  Skin: Negative for color change, rash, nodules/bumps, redness, skin tightness, ulcers and sensitivity to sunlight.  Allergic/Immunologic: Negative for susceptible to infections.  Neurological: Negative for dizziness, headaches and night sweats.  Hematological: Negative for swollen glands.  Psychiatric/Behavioral: Positive for sleep disturbance. Negative for depressed mood. The patient is not nervous/anxious.     PMFS History:  Patient Active Problem List   Diagnosis Date Noted  . History of asthma 04/28/2017  . History of migraine 04/27/2017  . Family history of multiple sclerosis 04/27/2017  . Former smoker 04/27/2017  . Influenza 02/16/2017  . Spondyloarthropathy ([place])   . OSA (obstructive sleep apnea) 08/19/2016  . Obesity 08/19/2016  . Low back pain radiating to left lower extremity 08/06/2016  . Morbid (severe) obesity due to excess calories ([place]) 04/21/2016  . Post-viral cough syndrome 03/10/2016  . Sty, internal 03/03/2016  . LLQ abdominal pain 07/31/2015  . Daytime somnolence 07/31/2015  . Neck pain on right side 01/13/2015  . Cough variant asthma  vs VCD/ UACS  05/28/2014  . Pruritic condition 04/18/2014  . Right low back pain 09/18/2013  . Hepatic steatosis   . Prostatitis 05/09/2013  . Migraines   . Seasonal allergies   . GERD (gastroesophageal reflux disease)   . Hypertension     Past Medical History:  Diagnosis Date  . Arrhythmia    PALPITATIONS  AND PVC - improved with cutting down on caffeine  . Cough variant asthma 05/28/2014   05/27/2014 p extensive coaching HFA effectiveness =    75% > try dulera 100 2bid  - PFT's 07/03/2014 FEV1 4.15 (98%) ratio 89 and no chang p B2 and nl fef25-75  And nl dlco    . Diverticulosis 07/2015   by CT scan  . GERD (gastroesophageal reflux disease)   . Hepatic steatosis   . Hypertension    h/o LVH, resolved  . Migraines   . Seasonal allergies   . Spondyloarthropathy ([place])    ?AS, HLA-B27 +, CCP neg, prior on humira (Dr. Estanislado Pandy)  . Tremor     Family History  Problem Relation Age of Onset  . Asthma Father   . Arthritis Father        father, brother, Pgrandfather  . Chronic bronchitis Father   . Allergies Father   . Hypertension Brother        obese  . CAD Paternal Grandfather        MI x2  . Multiple sclerosis Brother   . Stroke Neg Hx   . Cancer Neg Hx    Past Surgical History:  Procedure Laterality Date  . NASAL RECONSTRUCTION WITH SEPTAL REPAIR  2017   [place]  . OTHER SURGICAL HISTORY  1992   facial surgery  . ROTATOR CUFF REPAIR  2009   left  . 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 He appears well-developed and well-nourished.  HENT:  Head: Normocephalic and atraumatic.  Eyes: Conjunctivae and EOM are normal. Pupils are equal, round, and reactive to light.  Neck: Normal range of motion. Neck supple.  Cardiovascular:  Normal rate, regular rhythm and normal heart sounds.   Pulmonary/Chest: Effort normal and breath sounds normal.  Abdominal: Soft. Bowel sounds are normal.  Neurological: He is alert and oriented to person, place, and time.  Skin: Skin is warm and dry. Capillary refill takes less than 2 seconds.  Psychiatric: He has a normal mood and affect. His behavior is normal.  Nursing note and vitals reviewed.    Musculoskeletal Exam: C-spine and thoracic spine good range of motion he has some limitation with range of motion of's lumbar spine finger to toe distance was about 6 inches. He had tenderness on palpation of her left SI joint. Shoulder joints elbow joints wrist joints are good range of motion. He has tenderness across PIP joints but no synovitis was noted. Hip joints knee joints  ankles MTPs PIPs with good range of motion with no synovitis.  CDAI Exam: No CDAI exam completed.    Investigation: Findings:  SRS as note from 12/30/2011: At age 73 he started having pain in his lower back and pain in his hands with decreased grip strength. At that time he was seen by Dr. Ouida Sills. He states his labs showed positive HLA-B27 and he was having a lot of joint pain and inflammation to the point he could not make a fist. He was having pain in his neck, thoracic and lumbar spine including SI joint areas. His x-rays were unremarkable. Dr. Ouida Sills did extensive lab work and since he was HLA-B27 positive he was placed on Humira. He states after starting Humira he felt much better. The swelling resolved. His neck, thoracic and lumbar pain also resolved. He states he went for a second opinion at [facility]. He thinks he might have seen Dr. Benjamine Mola but is not sure, and he did not feel that he had symptoms of ankylosing spondylitis. He also saw a chiropractor who did not agree with the diagnosis. He states he stopped his Humira for a while but his symptoms recurred and were quite severe. Then he went back to Dr. Ouida Sills and  started on Humira and it started working again. He states sometimes if he skips Humira he starts having increased pain. He still has some back pain and has flares off and on despite taking the medication.  He had neurological workup by Dr. love as his brother had history of multiple sclerosis which was negative. He also had some episodes of plantar fasciitis.    Imaging: Xr Hand 2 View Left  Result Date: 04/28/2017 Minimal PIP joint narrowing no MCP or wrist joint changes were noted. No results changes were noted. Impression these findings were consistent with mild osteoarthritis of the han  Xr Hand 2 View Right  Result Date: 04/28/2017 Minimal PIP joint narrowing no MCP or wrist joint changes were noted. No results changes were noted. Impression these findings were consistent with mild osteoarthritis of the hand  Xr Pelvis 1-2 Views  Result Date: 04/28/2017 Mild right SI joint sclerosis was noted, no SI joint changes were noted. No hip joint narrowing was noted.   Speciality Comments: No specialty comments available.    Procedures:  No procedures performed Allergies: Patient has no known allergies.   Assessment / Plan:     Visit Diagnoses: Spondyloarthropathy ([place]) - HLA-B27 positive history of cervical, thoracic, lumbar pain, history of plantar fasciitis. Treated with Humira by Dr. Ouida Sills from age [age]. Dr. Benjamine Mola at [facility] did not agree with his diagnosis of ankylosing spondylitis. He continues to have lower back discomfort but denies any morning stiffness. Gives history of intermittent swelling in his hands. There is no family history of ankylosing spondylitis or psoriatic arthritis. I do not see any synovitis on examination today.  Chronic left SI joint pain -he had tenderness on examination. Plan: XR Pelvis 1-2 Views. Mild right joint sclerosis was noted there were no changes in the left SI joint was noted. I plan a MRI of his SI joints to rule out sacroiliitis. He requests open  MRI due to claustrophobia.  Pain in both hands -gives history of intermittent swelling in his hands. I do not see any synovitis on examination today. Plan: XR Hand 2 View Right, XR Hand 2 View Left. Revealed mild osteoarthritic changes only. I'll schedule ultrasound of his bilateral hands to rule out synovitis. All autoimmune workup in the  past has been negative.  Daytime somnolence  Hepatic steatosis  Essential hypertension  History of migraine  Former smoker - Quit in 2001  History of asthma  Family history of multiple sclerosis - Brother    Orders: Orders Placed This Encounter  Procedures  . XR Pelvis 1-2 Views  . XR Hand 2 View Right  . XR Hand 2 View Left  . MR SACRUM SI JOINTS WO CONTRAST   No orders of the defined types were placed in this encounter.   Face-to-face time spent with patient was 30 minutes. 50% of time was spent in counseling and coordination of care.  Follow-Up Instructions: Return in about 6 weeks (around 06/09/2017) for SIJ pain, HLAB 27 positive.   Bo Merino, MD  Note - This record has been created using Editor, commissioning.  Chart creation errors have been sought, but may not always  have been located. Such creation errors do not reflect on  the standard of medical care.

## 2017-04-28 ENCOUNTER — Ambulatory Visit (INDEPENDENT_AMBULATORY_CARE_PROVIDER_SITE_OTHER): Payer: PRIVATE HEALTH INSURANCE

## 2017-04-28 ENCOUNTER — Ambulatory Visit (INDEPENDENT_AMBULATORY_CARE_PROVIDER_SITE_OTHER): Payer: PRIVATE HEALTH INSURANCE | Admitting: Rheumatology

## 2017-04-28 ENCOUNTER — Ambulatory Visit (INDEPENDENT_AMBULATORY_CARE_PROVIDER_SITE_OTHER): Payer: Self-pay

## 2017-04-28 ENCOUNTER — Encounter: Payer: Self-pay | Admitting: Rheumatology

## 2017-04-28 VITALS — BP 130/78 | HR 78 | Resp 14 | Wt 230.0 lb

## 2017-04-28 DIAGNOSIS — M79641 Pain in right hand: Secondary | ICD-10-CM

## 2017-04-28 DIAGNOSIS — M533 Sacrococcygeal disorders, not elsewhere classified: Secondary | ICD-10-CM

## 2017-04-28 DIAGNOSIS — Z1589 Genetic susceptibility to other disease: Secondary | ICD-10-CM

## 2017-04-28 DIAGNOSIS — M79642 Pain in left hand: Secondary | ICD-10-CM

## 2017-04-28 DIAGNOSIS — M4690 Unspecified inflammatory spondylopathy, site unspecified: Secondary | ICD-10-CM

## 2017-04-28 DIAGNOSIS — I10 Essential (primary) hypertension: Secondary | ICD-10-CM

## 2017-04-28 DIAGNOSIS — Z820 Family history of epilepsy and other diseases of the nervous system: Secondary | ICD-10-CM

## 2017-04-28 DIAGNOSIS — M47819 Spondylosis without myelopathy or radiculopathy, site unspecified: Secondary | ICD-10-CM

## 2017-04-28 DIAGNOSIS — K760 Fatty (change of) liver, not elsewhere classified: Secondary | ICD-10-CM

## 2017-04-28 DIAGNOSIS — R400 Somnolence: Secondary | ICD-10-CM | POA: Diagnosis not present

## 2017-04-28 DIAGNOSIS — G8929 Other chronic pain: Secondary | ICD-10-CM

## 2017-04-28 DIAGNOSIS — Z8669 Personal history of other diseases of the nervous system and sense organs: Secondary | ICD-10-CM

## 2017-04-28 DIAGNOSIS — Z8709 Personal history of other diseases of the respiratory system: Secondary | ICD-10-CM

## 2017-04-28 DIAGNOSIS — Z87891 Personal history of nicotine dependence: Secondary | ICD-10-CM

## 2017-05-02 ENCOUNTER — Ambulatory Visit: Payer: PRIVATE HEALTH INSURANCE | Admitting: Internal Medicine

## 2017-05-02 ENCOUNTER — Other Ambulatory Visit: Payer: Self-pay | Admitting: Orthopedic Surgery

## 2017-05-02 DIAGNOSIS — M25511 Pain in right shoulder: Secondary | ICD-10-CM

## 2017-05-20 ENCOUNTER — Telehealth (INDEPENDENT_AMBULATORY_CARE_PROVIDER_SITE_OTHER): Payer: Self-pay | Admitting: *Deleted

## 2017-05-20 NOTE — Telephone Encounter (Signed)
Pt has appt for MRI sacrum on June 14th at 1pm, pt is to arrive 12:45pm to register, left message on both home and mobile to return call for appt.

## 2017-05-23 ENCOUNTER — Other Ambulatory Visit: Payer: Self-pay | Admitting: Rheumatology

## 2017-05-24 ENCOUNTER — Other Ambulatory Visit: Payer: Self-pay | Admitting: Rheumatology

## 2017-05-24 ENCOUNTER — Ambulatory Visit: Payer: PRIVATE HEALTH INSURANCE | Admitting: Rheumatology

## 2017-05-24 ENCOUNTER — Ambulatory Visit
Admission: RE | Admit: 2017-05-24 | Discharge: 2017-05-24 | Disposition: A | Discharge status: Home / Self Care | Payer: PRIVATE HEALTH INSURANCE | Source: Ambulatory Visit | Attending: Orthopedic Surgery | Admitting: Orthopedic Surgery

## 2017-05-24 DIAGNOSIS — M25511 Pain in right shoulder: Secondary | ICD-10-CM

## 2017-05-24 MED ORDER — IOPAMIDOL (ISOVUE-M 200) INJECTION 41%
20.0000 mL | Freq: Once | INTRAMUSCULAR | Status: AC
  Administered 2017-05-24: 20 mL via INTRA_ARTICULAR

## 2017-05-24 NOTE — Telephone Encounter (Signed)
Pt aware of appt.

## 2017-06-02 ENCOUNTER — Ambulatory Visit (HOSPITAL_COMMUNITY): Admission: RE | Admit: 2017-06-02 | Payer: PRIVATE HEALTH INSURANCE | Source: Ambulatory Visit

## 2017-06-06 ENCOUNTER — Other Ambulatory Visit: Payer: Self-pay | Admitting: Family Medicine

## 2017-06-06 NOTE — Telephone Encounter (Signed)
Pt request status of nexium refill to [facility]. Advised pt refilled and pt will ck with pharmacy.

## 2017-07-07 NOTE — Progress Notes (Deleted)
Assessment / Plan:     Visit Diagnoses: Spondyloarthropathy ([place]) - HLA-B27 positive history of cervical, thoracic, lumbar pain, history of plantar fasciitis. Treated with Humira by Dr. Ouida Sills from age [age]. Dr. Benjamine Mola at [facility] did not agree with his diagnosis of ankylosing spondylitis. He continues to have lower back discomfort but denies any morning stiffness. Gives history of intermittent swelling in his hands. There is no family history of ankylosing spondylitis or psoriatic arthritis. I do not see any synovitis on examination today.  Chronic left SI joint pain -he had tenderness on examination. Plan: XR Pelvis 1-2 Views. Mild right joint sclerosis was noted there were no changes in the left SI joint was noted. I plan a MRI of his SI joints to rule out sacroiliitis. He requests open MRI due to claustrophobia.  Pain in both hands -gives history of intermittent swelling in his hands. I do not see any synovitis on examination today. Plan: XR Hand 2 View Right, XR Hand 2 View Left. Revealed mild osteoarthritic changes only. I'll schedule ultrasound of his bilateral hands to rule out synovitis. All autoimmune workup in the past has been negative.  Daytime somnolence  Hepatic steatosis  Essential hypertension  History of migraine  Former smoker - Quit in 2001  History of asthma  Family history of multiple sclerosis - Brother

## 2017-07-13 ENCOUNTER — Ambulatory Visit: Payer: PRIVATE HEALTH INSURANCE | Admitting: Rheumatology

## 2017-09-01 ENCOUNTER — Other Ambulatory Visit: Payer: Self-pay | Admitting: Family Medicine

## 2017-12-07 ENCOUNTER — Other Ambulatory Visit: Payer: Self-pay | Admitting: Family Medicine

## 2017-12-20 HISTORY — PX: SHOULDER ARTHROSCOPY W/ LABRAL REPAIR: SHX2399

## 2018-01-31 ENCOUNTER — Encounter: Payer: Self-pay | Admitting: Family Medicine

## 2018-01-31 ENCOUNTER — Ambulatory Visit: Payer: PRIVATE HEALTH INSURANCE | Admitting: Family Medicine

## 2018-01-31 VITALS — BP 128/82 | HR 78 | Temp 98.3°F | Wt 237.0 lb

## 2018-01-31 DIAGNOSIS — M79605 Pain in left leg: Secondary | ICD-10-CM | POA: Diagnosis not present

## 2018-01-31 DIAGNOSIS — M545 Low back pain, unspecified: Secondary | ICD-10-CM

## 2018-01-31 DIAGNOSIS — R1032 Left lower quadrant pain: Secondary | ICD-10-CM

## 2018-01-31 DIAGNOSIS — Z23 Encounter for immunization: Secondary | ICD-10-CM | POA: Diagnosis not present

## 2018-01-31 LAB — POC URINALSYSI DIPSTICK (AUTOMATED)
Bilirubin, UA: NEGATIVE
Glucose, UA: NEGATIVE
Ketones, UA: NEGATIVE
Leukocytes, UA: NEGATIVE
Nitrite, UA: NEGATIVE
PROTEIN UA: NEGATIVE
RBC UA: NEGATIVE
SPEC GRAV UA: 1.015 (ref 1.010–1.025)
Urobilinogen, UA: 0.2 E.U./dL
pH, UA: 6 (ref 5.0–8.0)

## 2018-01-31 MED ORDER — NAPROXEN SODIUM 550 MG PO TABS: ORAL_TABLET | ORAL | 0 refills | Status: DC

## 2018-01-31 NOTE — Assessment & Plan Note (Addendum)
Longstanding, acutely worse over the past few weeks. See above. 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 Will Rx naprosyn course. Update with effect. No red flags today. If no improvement, consider referral back to [facility].

## 2018-01-31 NOTE — Progress Notes (Signed)
BP 128/82 (BP Location: Left Arm, Patient Position: Sitting, Cuff Size: Normal)   Pulse 78   Temp 98.3 F (36.8 C) (Oral)   Wt 237 lb (107.5 kg)   SpO2 97%   BMI 34.01 kg/m    CC: back pain Subjective:    Patient ID: Jordan Buchanan, male    DOB: March 21, 1971, 47 y.o.   MRN: [id number]  HPI: Jordan Buchanan is a 47 y.o. male presenting on 01/31/2018 for Back Pain (Located in center lower back. Started a couple yrs. Now radiates around to LLQ of abd. Pain in abd is constanting, stabbing pain, gradually increasing into the day. Tried Advil. Denies N/V/D.)   Endorses several week h/o L lower back pain (longstanding) with new radiation to LLQ abd. Describes sharp stabbing pain, constant in LLQ. Some shooting pain down L leg, associated with L lateral thigh numbness/tingling. L heel stays sore. No fevers/chills, diarrhea, constipation, blood in stool, nausea/vomiting. No bowel/bladder incontinence. No saddle anesthesia.  Has run out of naprosyn, it did previously help back. No urinary symptoms other than strong odor.   H/o lower back pain 2017 that failed conservative management. At that time MRI showed L4/5 small shallow left foraminal disc protrusion. Saw [facility] 2017 s/p ESI   H/o spondyloarthropathy ?AS followed by rheum Dr Estanislado Pandy last viist 04/2017. Prior on Humira by prior rheumatologist.   He had R shoulder surgery last year (torn labrum)  Relevant past medical, surgical, family and social history reviewed and updated as indicated. Interim medical history since our last visit reviewed. Allergies and medications reviewed and updated. Outpatient Medications Prior to Visit  Medication Sig Dispense Refill  . albuterol (VENTOLIN HFA) 108 (90 Base) MCG/ACT inhaler Inhale 2 puffs into the lungs every 4 (four) hours as needed for wheezing or shortness of breath. 1 Inhaler 5  . amLODipine (NORVASC) 10 MG tablet TAKE 1 TABLET (10 MG TOTAL) BY MOUTH DAILY. 90 tablet 3  . cetirizine (ZYRTEC) 10  MG tablet Take 10 mg by mouth at bedtime.     Marland Kitchen esomeprazole (NEXIUM) 40 MG capsule TAKE 1 CAPSULE BY MOUTH EVERY DAY BY MOUTH 90 capsule 0  . fluticasone (FLONASE) 50 MCG/ACT nasal spray Place 2 sprays into both nostrils daily. 16 g 5  . metoprolol succinate (TOPROL-XL) 50 MG 24 hr tablet TAKE 1 TABLET (50 MG TOTAL) BY MOUTH DAILY. 90 tablet 3  . SYMBICORT 80-4.5 MCG/ACT inhaler   5  . naproxen sodium (ANAPROX) 550 MG tablet Take 550 mg by mouth daily as needed (pain).    . traMADol (ULTRAM) 50 MG tablet 1-2 every 4 hours as needed for cough or pain (Patient not taking: Reported on 04/28/2017) 40 tablet 0   No facility-administered medications prior to visit.      Per HPI unless specifically indicated in ROS section below Review of Systems     Objective:    BP 128/82 (BP Location: Left Arm, Patient Position: Sitting, Cuff Size: Normal)   Pulse 78   Temp 98.3 F (36.8 C) (Oral)   Wt 237 lb (107.5 kg)   SpO2 97%   BMI 34.01 kg/m   Wt Readings from Last 3 Encounters:  01/31/18 237 lb (107.5 kg)  04/28/17 230 lb (104.3 kg)  04/05/17 234 lb (106.1 kg)    Physical Exam  Constitutional: He is oriented to person, place, and time. He appears well-developed and well-nourished. No distress.  HENT:  Mouth/Throat: Oropharynx is clear and moist. No oropharyngeal exudate.  Abdominal: Soft.  Normal appearance and bowel sounds are normal. He exhibits no distension and no mass. There is no hepatosplenomegaly. There is tenderness in the left lower quadrant. There is no rigidity, no rebound, no guarding, no CVA tenderness and negative Murphy's sign.  Musculoskeletal: He exhibits no edema.  No pain midline spine No paraspinous mm tenderness Neg SLR bilaterally. No pain with int/ext rotation at hip. + FABER on left - localizes to L groin, not SIJ. No significant pain at SIJ, GTB or sciatic notch bilaterally.   Neurological: He is alert and oriented to person, place, and time. He has normal  strength. A sensory deficit is present.  5/5 strength BLE  Skin: Skin is warm and dry. No rash noted. No erythema.  Nursing note and vitals reviewed.   Lab Results  Component Value Date   WBC 8.6 03/11/2017   HGB 15.4 03/11/2017   HCT 43.7 03/11/2017   MCV 85.9 03/11/2017   PLT 262 03/11/2017    Lab Results  Component Value Date   CREATININE 0.86 03/11/2017      Assessment & Plan:   Problem List Items Addressed This Visit    LLQ abdominal pain    Relatively new - check UA and CBC today - help r/o diverticulitis, kidney stone. 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  Reviewed need to seek urgent care if worsening symptoms or fever.       Relevant Orders   CBC with Differential/Platelet   Low back pain radiating to left lower extremity - Primary    Longstanding, acutely worse over the past few weeks. See above. 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 Will Rx naprosyn course. Update with effect. No red flags today. If no improvement, consider referral back to [facility].       Relevant Medications   naproxen sodium (ANAPROX) 550 MG tablet       Meds ordered this encounter  Medications  . naproxen sodium (ANAPROX) 550 MG tablet    Sig: Take one tablet twice daily with food for 5 days then as needed    Dispense:  50 tablet    Refill:  0   Orders Placed This Encounter  Procedures  . CBC with Differential/Platelet    Follow up plan: Return if symptoms worsen or fail to improve.  Ria Bush, MD  Back pain

## 2018-01-31 NOTE — Addendum Note (Signed)
Addended by: Brenton Grills on: 4/51/4604 79:98 PM   Modules accepted: Orders

## 2018-01-31 NOTE — Patient Instructions (Addendum)
Flu shot today.  CBC today. Urinalysis today.  We will treat as flare of back pain - restart naprosyn (twice daily with food) course for 1 week. If not better with this, let us know for return to [place] [facility].

## 2018-01-31 NOTE — Assessment & Plan Note (Addendum)
Relatively new - check UA and CBC today - help r/o diverticulitis, kidney stone. 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  Reviewed need to seek urgent care if worsening symptoms or fever.

## 2018-02-01 LAB — CBC WITH DIFFERENTIAL/PLATELET
BASOS ABS: 0.1 10*3/uL (ref 0.0–0.1)
BASOS PCT: 0.9 % (ref 0.0–3.0)
EOS ABS: 0.2 10*3/uL (ref 0.0–0.7)
Eosinophils Relative: 2.8 % (ref 0.0–5.0)
HEMATOCRIT: 45.6 % (ref 39.0–52.0)
Hemoglobin: 15.9 g/dL (ref 13.0–17.0)
LYMPHS ABS: 3 10*3/uL (ref 0.7–4.0)
LYMPHS PCT: 34.2 % (ref 12.0–46.0)
MCHC: 34.8 g/dL (ref 30.0–36.0)
MCV: 87 fl (ref 78.0–100.0)
MONOS PCT: 7.8 % (ref 3.0–12.0)
Monocytes Absolute: 0.7 10*3/uL (ref 0.1–1.0)
NEUTROS ABS: 4.8 10*3/uL (ref 1.4–7.7)
NEUTROS PCT: 54.3 % (ref 43.0–77.0)
Platelets: 281 10*3/uL (ref 150.0–400.0)
RBC: 5.24 Mil/uL (ref 4.22–5.81)
RDW: 13.7 % (ref 11.5–15.5)
WBC: 8.9 10*3/uL (ref 4.0–10.5)

## 2018-02-07 ENCOUNTER — Encounter: Payer: Self-pay | Admitting: Family Medicine

## 2018-02-11 ENCOUNTER — Other Ambulatory Visit: Payer: Self-pay | Admitting: Adult Health

## 2018-02-28 ENCOUNTER — Other Ambulatory Visit: Payer: Self-pay

## 2018-02-28 ENCOUNTER — Encounter: Payer: Self-pay | Admitting: Family Medicine

## 2018-02-28 ENCOUNTER — Ambulatory Visit: Payer: PRIVATE HEALTH INSURANCE | Admitting: Family Medicine

## 2018-02-28 DIAGNOSIS — H00021 Hordeolum internum right upper eyelid: Secondary | ICD-10-CM | POA: Insufficient documentation

## 2018-02-28 MED ORDER — ERYTHROMYCIN 5 MG/GM OP OINT: 1.0000 "application " | TOPICAL_OINTMENT | Freq: Every day | OPHTHALMIC | 0 refills | Status: AC

## 2018-02-28 NOTE — Assessment & Plan Note (Signed)
Warm compresses, antibiotic ointment. Nml EOM.

## 2018-02-28 NOTE — Patient Instructions (Signed)
   Apply topical antibiotic ointment to right eye.  Contnue warm compresses 3 times daily.  Call if not improving in 5-7 days as expected.

## 2018-02-28 NOTE — Progress Notes (Signed)
Subjective:    Patient ID: Jordan Buchanan, male    DOB: [birth year], 47 y.o.   MRN: [id number]  Eye Pain   The right eye is affected. This is a new (noted stye on left corner of eyelid) problem. The current episode started in the past 7 days (3 days). The problem occurs intermittently. The problem has been waxing and waning. There was no injury mechanism ( wears glasses). The patient is experiencing no pain. There is no known exposure to pink eye. He does not wear contacts. Associated symptoms include an eye discharge, eye redness, itching and a recent URI. Pertinent negatives include no blurred vision, double vision, fever or foreign body sensation. He has tried water ( warm compress) for the symptoms. The treatment provided mild relief.  Sore Throat   The problem has been waxing and waning. Neither side of throat is experiencing more pain than the other. There has been no fever. Associated symptoms include congestion. He has had no exposure to strep or mono.   Blood pressure 118/80, pulse 75, temperature (!) 97.5 F (36.4 C), temperature source Oral, height 5\' 10"  (1.778 m), weight 234 lb (106.1 kg).   Review of Systems  Constitutional: Negative for fever.  HENT: Positive for congestion.   Eyes: Positive for pain, discharge, redness and itching. Negative for blurred vision and double vision.       Objective:   Physical Exam  Constitutional: Vital signs are normal. He appears well-developed and well-nourished.  Non-toxic appearance. He does not appear ill. No distress.  HENT:  Head: Normocephalic and atraumatic.  Right Ear: Hearing, tympanic membrane, external ear and ear canal normal. No tenderness. No foreign bodies. Tympanic membrane is not retracted and not bulging.  Left Ear: Hearing, tympanic membrane, external ear and ear canal normal. No tenderness. No foreign bodies. Tympanic membrane is not retracted and not bulging.  Nose: Nose normal. No mucosal edema or rhinorrhea. Right  sinus exhibits no maxillary sinus tenderness and no frontal sinus tenderness. Left sinus exhibits no maxillary sinus tenderness and no frontal sinus tenderness.  Mouth/Throat: Uvula is midline, oropharynx is clear and moist and mucous membranes are normal. Normal dentition. No dental caries. No oropharyngeal exudate or tonsillar abscesses.  Eyes: EOM are normal. Pupils are equal, round, and reactive to light. Lids are everted and swept, no foreign bodies found. Right eye exhibits discharge and hordeolum. Right eye exhibits no exudate. Left eye exhibits no discharge and no hordeolum. No foreign body present in the left eye. Right conjunctiva is injected.  Stye on upper  Right eyelid at corner, upper eyelid erythematous and swollen  slight erythema in rigth side of right eye.  Neck: Trachea normal, normal range of motion and phonation normal. Neck supple. Carotid bruit is not present. No thyroid mass and no thyromegaly present.  Cardiovascular: Normal rate, regular rhythm, S1 normal, S2 normal, normal heart sounds, intact distal pulses and normal pulses. Exam reveals no gallop.  No murmur heard. Pulmonary/Chest: Effort normal and breath sounds normal. No respiratory distress. He has no wheezes. He has no rhonchi. He has no rales.  Abdominal: Soft. Normal appearance and bowel sounds are normal. There is no hepatosplenomegaly. There is no tenderness. There is no rebound, no guarding and no CVA tenderness. No hernia.  Neurological: He is alert. He has normal reflexes.  Skin: Skin is warm, dry and intact. No rash noted.  Psychiatric: He has a normal mood and affect. His speech is normal and behavior is normal. Judgment normal.  Assessment & Plan:

## 2018-03-11 ENCOUNTER — Emergency Department (HOSPITAL_BASED_OUTPATIENT_CLINIC_OR_DEPARTMENT_OTHER): Payer: PRIVATE HEALTH INSURANCE

## 2018-03-11 ENCOUNTER — Other Ambulatory Visit: Payer: Self-pay

## 2018-03-11 ENCOUNTER — Encounter (HOSPITAL_BASED_OUTPATIENT_CLINIC_OR_DEPARTMENT_OTHER): Payer: Self-pay | Admitting: Emergency Medicine

## 2018-03-11 ENCOUNTER — Emergency Department (HOSPITAL_BASED_OUTPATIENT_CLINIC_OR_DEPARTMENT_OTHER)
Admission: EM | Admit: 2018-03-11 | Discharge: 2018-03-11 | Disposition: A | Discharge status: Home / Self Care | Payer: PRIVATE HEALTH INSURANCE | Attending: Emergency Medicine | Admitting: Emergency Medicine

## 2018-03-11 DIAGNOSIS — Z87891 Personal history of nicotine dependence: Secondary | ICD-10-CM | POA: Diagnosis not present

## 2018-03-11 DIAGNOSIS — I10 Essential (primary) hypertension: Secondary | ICD-10-CM | POA: Insufficient documentation

## 2018-03-11 DIAGNOSIS — J45909 Unspecified asthma, uncomplicated: Secondary | ICD-10-CM | POA: Diagnosis not present

## 2018-03-11 DIAGNOSIS — Z79899 Other long term (current) drug therapy: Secondary | ICD-10-CM | POA: Insufficient documentation

## 2018-03-11 DIAGNOSIS — N201 Calculus of ureter: Secondary | ICD-10-CM | POA: Insufficient documentation

## 2018-03-11 DIAGNOSIS — R1032 Left lower quadrant pain: Secondary | ICD-10-CM

## 2018-03-11 LAB — COMPREHENSIVE METABOLIC PANEL
ALBUMIN: 4.4 g/dL (ref 3.5–5.0)
ALT: 32 U/L (ref 17–63)
AST: 27 U/L (ref 15–41)
Alkaline Phosphatase: 76 U/L (ref 38–126)
Anion gap: 9 (ref 5–15)
BUN: 7 mg/dL (ref 6–20)
CHLORIDE: 107 mmol/L (ref 101–111)
CO2: 21 mmol/L — AB (ref 22–32)
Calcium: 8.7 mg/dL — ABNORMAL LOW (ref 8.9–10.3)
Creatinine, Ser: 0.8 mg/dL (ref 0.61–1.24)
GFR calc Af Amer: >60 mL/min (ref >60)
GFR calc non Af Amer: >60 mL/min (ref >60)
Glucose, Bld: 104 mg/dL — ABNORMAL HIGH (ref 65–99)
POTASSIUM: 4.1 mmol/L (ref 3.5–5.1)
SODIUM: 137 mmol/L (ref 135–145)
Total Bilirubin: 0.8 mg/dL (ref 0.3–1.2)
Total Protein: 7.2 g/dL (ref 6.5–8.1)

## 2018-03-11 LAB — URINALYSIS, ROUTINE W REFLEX MICROSCOPIC
BILIRUBIN URINE: NEGATIVE
Glucose, UA: NEGATIVE mg/dL
Hgb urine dipstick: NEGATIVE
KETONES UR: NEGATIVE mg/dL
LEUKOCYTES UA: NEGATIVE
NITRITE: NEGATIVE
Protein, ur: NEGATIVE mg/dL
SPECIFIC GRAVITY, URINE: 1.02 (ref 1.005–1.030)
pH: 6 (ref 5.0–8.0)

## 2018-03-11 LAB — CBC
HEMATOCRIT: 43.9 % (ref 39.0–52.0)
Hemoglobin: 15.7 g/dL (ref 13.0–17.0)
MCH: 30.4 pg (ref 26.0–34.0)
MCHC: 35.8 g/dL (ref 30.0–36.0)
MCV: 85.1 fL (ref 78.0–100.0)
Platelets: 253 10*3/uL (ref 150–400)
RBC: 5.16 MIL/uL (ref 4.22–5.81)
RDW: 12.8 % (ref 11.5–15.5)
WBC: 8.6 10*3/uL (ref 4.0–10.5)

## 2018-03-11 LAB — LIPASE, BLOOD: LIPASE: 34 U/L (ref 11–51)

## 2018-03-11 MED ORDER — IOPAMIDOL (ISOVUE-300) INJECTION 61%
100.0000 mL | Freq: Once | INTRAVENOUS | Status: AC | PRN
  Administered 2018-03-11: 100 mL via INTRAVENOUS

## 2018-03-11 MED ORDER — ONDANSETRON 4 MG PO TBDP: 4.0000 mg | ORAL_TABLET | Freq: Three times a day (TID) | ORAL | 0 refills | Status: DC | PRN

## 2018-03-11 MED ORDER — TRAMADOL HCL 50 MG PO TABS: 50.0000 mg | ORAL_TABLET | Freq: Four times a day (QID) | ORAL | 0 refills | Status: DC | PRN

## 2018-03-11 NOTE — ED Triage Notes (Signed)
intermittent LUQ pain x 1 month with nausea and constipation. Pain is worse of the last few days. Denies vomiting.

## 2018-03-11 NOTE — ED Notes (Signed)
Pt reports "queasy" x 3-4 days. C/o left lower back pain and also tender to palpation LLQ. Pt reports he has been taking sennakot and dulcolax x 1 week with results. Last BM today

## 2018-03-11 NOTE — Discharge Instructions (Addendum)
Your workup has been very reassuring in the emergency department today.  Your blood tests were normal.  Your urine did not show any signs of infection.  Your CT scan did show a right-sided kidney stone but it seems to be chronic as you described but no signs of the backup of urine.  This is not likely causing her pain.  Would recommend using pain medication as needed.  Continue using NSAIDs such as ibuprofen, Motrin, Aleve or naproxen.  May take Tylenol as well.  Have given you Zofran for any nausea.  Follow-up with your primary care doctor return the ED with any worsening symptoms.  I would recommend possible colonoscopy for ongoing symptoms.

## 2018-03-12 NOTE — ED Provider Notes (Signed)
[facility] EMERGENCY DEPARTMENT Provider Note   CSN: [id number] Arrival date & time: 03/11/18  1221     History   Chief Complaint Chief Complaint  Patient presents with  . Abdominal Pain    HPI Jordan Buchanan is a 47 y.o. male.  HPI 47 year old Caucasian male past medical history significant for GERD, hypertension, migraines presents to the emergency department today with complaints of left lower quadrant abdominal pain for the past month.  Patient states that the pain is sharp in nature and constant.  Patient reports that he has been having some constipation.  Started taking Senokot and Dulcolax with result in bowel movements.  Denies any bloody stools.  Patient reports some nausea but denies any emesis.  Patient does have a history of a right kidney stone but states that this feels very different.  Denies any urinary symptoms.  Patient states his last bowel move was this morning.  States that palpation and movement makes the pain worse.  Nothing makes the pain better.  He has tried several over-the-counter pain medication with only little relief.  Patient was seen by his primary care doctor 3 weeks ago for same symptoms.  Was started on naproxen at that time without any relief.  Patient has no history of diverticulitis.  Denies any testicular pain, testicular swelling, penile discharge.  Denies any flank pain.  No known injuries.  Pt denies any fever, chill, ha, vision changes, lightheadedness, dizziness, congestion, neck pain, cp, sob, cough, urinary symptoms,  melena, hematochezia, lower extremity paresthesias.  Past Medical History:  Diagnosis Date  . Arrhythmia    PALPITATIONS AND PVC - improved with cutting down on caffeine  . Cough variant asthma 05/28/2014   05/27/2014 p extensive coaching HFA effectiveness =    75% > try dulera 100 2bid  - PFT's 07/03/2014 FEV1 4.15 (98%) ratio 89 and no chang p B2 and nl fef25-75  And nl dlco    . Diverticulosis 07/2015   by CT scan  .  GERD (gastroesophageal reflux disease)   . Hepatic steatosis   . Hypertension    h/o LVH, resolved  . Migraines   . Seasonal allergies   . 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 Estanislado Pandy)  . Tremor     Patient Active Problem List   Diagnosis Date Noted  . Hordeolum internum of right upper eyelid 02/28/2018  . History of asthma 04/28/2017  . History of migraine 04/27/2017  . Family history of multiple sclerosis 04/27/2017  . Former smoker 04/27/2017  . Influenza 02/16/2017  . Spondyloarthropathy ([place])   . OSA (obstructive sleep apnea) 08/19/2016  . Obesity 08/19/2016  . Low back pain radiating to left lower extremity 08/06/2016  . Morbid (severe) obesity due to excess calories ([place]) 04/21/2016  . Post-viral cough syndrome 03/10/2016  . Sty, internal 03/03/2016  . LLQ abdominal pain 07/31/2015  . Daytime somnolence 07/31/2015  . Neck pain on right side 01/13/2015  . Cough variant asthma  vs VCD/ UACS  05/28/2014  . Pruritic condition 04/18/2014  . Right low back pain 09/18/2013  . Hepatic steatosis   . Prostatitis 05/09/2013  . Migraines   . Seasonal allergies   . GERD (gastroesophageal reflux disease)   . Hypertension     Past Surgical History:  Procedure Laterality Date  . NASAL RECONSTRUCTION WITH SEPTAL REPAIR  2017   [facility]  . OTHER SURGICAL HISTORY  1992   facial surgery  . ROTATOR  CUFF REPAIR  2009   left  . UMBILICAL HERNIA REPAIR  05/10/2012   Procedure: HERNIA REPAIR UMBILICAL ADULT;  Surgeon: Harl Bowie, MD;  Location: [facility];  Service: General;  Laterality: N/A;        Home Medications    Prior to Admission medications   Medication Sig Start Date End Date Taking? Authorizing Provider  albuterol (VENTOLIN HFA) 108 (90 Base) MCG/ACT inhaler Inhale 2 puffs into the lungs every 4 (four) hours as needed for wheezing or shortness of breath. 03/30/17  Yes Parrett, Tammy S, NP  amLODipine  (NORVASC) 10 MG tablet TAKE 1 TABLET (10 MG TOTAL) BY MOUTH DAILY. 03/14/17  Yes Ria Bush, MD  cetirizine (ZYRTEC) 10 MG tablet Take 10 mg by mouth at bedtime.    Yes [provider]  esomeprazole (NEXIUM) 40 MG capsule TAKE 1 CAPSULE BY MOUTH EVERY DAY BY MOUTH 12/07/17  Yes Ria Bush, MD  fluticasone (FLONASE) 50 MCG/ACT nasal spray PLACE 2 SPRAYS INTO BOTH NOSTRILS DAILY. 02/13/18  Yes Parrett, Tammy S, NP  metoprolol succinate (TOPROL-XL) 50 MG 24 hr tablet TAKE 1 TABLET (50 MG TOTAL) BY MOUTH DAILY. 03/14/17  Yes Ria Bush, MD  naproxen sodium (ANAPROX) 550 MG tablet Take one tablet twice daily with food for 5 days then as needed 01/31/18  Yes Ria Bush, MD  [facility] 80-4.5 MCG/ACT inhaler  04/25/17  Yes [provider]  ondansetron (ZOFRAN ODT) 4 MG disintegrating tablet Take 1 tablet (4 mg total) by mouth every 8 (eight) hours as needed for nausea or vomiting. 03/11/18   Rudie Rikard, Zack Seal, PA-C  traMADol (ULTRAM) 50 MG tablet Take 1 tablet (50 mg total) by mouth every 6 (six) hours as needed. 03/11/18   Doristine Devoid, PA-C    Family History Family History  Problem Relation Age of Onset  . Asthma Father   . Arthritis Father        father, brother, Pgrandfather  . Chronic bronchitis Father   . Allergies Father   . Hypertension Brother        obese  . CAD Paternal Grandfather        MI x2  . Multiple sclerosis Brother   . Stroke Neg Hx   . Cancer Neg Hx     Social History Social History   Tobacco Use  . Smoking status: Former Smoker    Packs/day: 1.00    Years: 13.00    Pack years: 13.00    Types: Cigarettes    Last attempt to quit: 12/21/1999    Years since quitting: 18.2  . Smokeless tobacco: Former Systems developer    Types: Snuff    Quit date: 12/20/2013  Substance Use Topics  . Alcohol use: No    Alcohol/week: 0.0 oz    Comment: quit 2002  . Drug use: Yes    Types: Marijuana    Comment: quit 2002     Allergies   Patient  has no known allergies.   Review of Systems Review of Systems  All other systems reviewed and are negative.    Physical Exam Updated Vital Signs BP 115/81 (BP Location: Right Arm)   Pulse 66   Temp 98.7 F (37.1 C) (Oral)   Resp 16   Ht 5' 10"  (1.778 m)   Wt 106.1 kg (234 lb)   SpO2 98%   BMI 33.58 kg/m   Physical Exam  Constitutional: He is oriented to person, place, and time. He appears well-developed and  well-nourished.  Non-toxic appearance. No distress.  HENT:  Head: Normocephalic and atraumatic.  Mouth/Throat: Oropharynx is clear and moist.  Eyes: Pupils are equal, round, and reactive to light. Conjunctivae are normal. Right eye exhibits no discharge. Left eye exhibits no discharge.  Neck: Normal range of motion. Neck supple.  Cardiovascular: Normal rate, regular rhythm, normal heart sounds and intact distal pulses. Exam reveals no gallop and no friction rub.  No murmur heard. Pulmonary/Chest: Effort normal and breath sounds normal. No respiratory distress. He exhibits no tenderness.  Abdominal: Soft. Bowel sounds are normal. He exhibits no distension. There is no hepatosplenomegaly. There is tenderness in the left lower quadrant. There is no rigidity, no rebound, no guarding, no CVA tenderness, no tenderness at McBurney's point and negative Murphy's sign.  Musculoskeletal: Normal range of motion. He exhibits no tenderness.  Lymphadenopathy:    He has no cervical adenopathy.  Neurological: He is alert and oriented to person, place, and time.  Skin: Skin is warm and dry. Capillary refill takes less than 2 seconds. No rash noted.  Psychiatric: His behavior is normal. Judgment and thought content normal.  Nursing note and vitals reviewed.    ED Treatments / Results  Labs (all labs ordered are listed, but only abnormal results are displayed) Labs Reviewed  COMPREHENSIVE METABOLIC PANEL - Abnormal; Notable for the following components:      Result Value   CO2 21 (*)     Glucose, Bld 104 (*)    Calcium 8.7 (*)    All other components within normal limits  URINALYSIS, ROUTINE W REFLEX MICROSCOPIC  LIPASE, BLOOD  CBC    EKG None  Radiology Ct Abdomen Pelvis W Contrast  Result Date: 03/11/2018 CLINICAL DATA:  47 year old with acute abdominal pain. Left lower abdominal pain for 3 weeks. EXAM: CT ABDOMEN AND PELVIS WITH CONTRAST TECHNIQUE: Multidetector CT imaging of the abdomen and pelvis was performed using the standard protocol following bolus administration of intravenous contrast. CONTRAST:  158m ISOVUE-300 IOPAMIDOL (ISOVUE-300) INJECTION 61% COMPARISON:  08/03/2015 FINDINGS: Lower chest: Lung bases are clear.  No pleural effusions. Hepatobiliary: Decreased attenuation of the liver. Findings are compatible with steatosis. No focal liver lesion. Gallbladder is normal. Portal venous system is patent. Pancreas: Normal appearance of the pancreas without inflammation or duct dilatation. Spleen: Normal in size without focal abnormality. Adrenals/Urinary Tract: Normal adrenal glands. 5 mm stone in the distal right ureter just proximal to the ureterovesical junction. There is no significant dilatation of the right ureter and no significant right hydronephrosis. Normal appearance of both kidneys. Urinary bladder is unremarkable. Stomach/Bowel: Stomach is within normal limits. Appendix appears normal. No evidence of bowel wall thickening, distention, or inflammatory changes. Vascular/Lymphatic: Normal caliber of the abdominal aorta without aneurysm. Main visceral arteries are patent. Few prominent lymph nodes along the right side of the mesentery and central mesentery are similar to the prior examination. Otherwise, no significant lymph node enlargement in the abdomen or pelvis. Reproductive: Calcifications in the prostate. Prostate is not enlarged. Seminal vesicles are unremarkable. Other: Again noted is a small focus of fat stranding in the anterior abdominal cavity  associated with a small umbilical hernia. This has not significantly changed since 2016. No free fluid. Negative for free air. Musculoskeletal: No acute abnormality. IMPRESSION: 5 mm stone in the distal right ureter just proximal to the right ureterovesical junction. There is no significant hydronephrosis or ureter dilatation associated with this stone. Stone appears to be nonobstructive. Hepatic steatosis. Electronically Signed   By: AQuita Skye  Anselm Pancoast M.D.   On: 03/11/2018 15:20    Procedures Procedures (including critical care time)  Medications Ordered in ED Medications  iopamidol (ISOVUE-300) 61 % injection 100 mL (100 mLs Intravenous Contrast Given 03/11/18 1450)     Initial Impression / Assessment and Plan / ED Course  I have reviewed the triage vital signs and the nursing notes.  Pertinent labs & imaging results that were available during my care of the patient were reviewed by me and considered in my medical decision making (see chart for details).    Presents to the ED with ongoing left lower quadrant abdominal pain for the past month.  Reports associated constipation, nausea but denies any emesis.  Should have fevers or urinary symptoms.  Patient is afebrile, no hypotension or tachycardia noted.  Mild left lower quadrant abdominal pain to palpation but no signs of peritonitis.  No CVA tenderness.  Heart regular rate and rhythm.  Lungs clear to auscultation bilaterally.  Patient is nontoxic, nonseptic appearing, in no apparent distress.  Patient's pain and other symptoms adequately managed in emergency department.  Fluid bolus given.  Labs, imaging and vitals reviewed.   Work reassuring.  No leukocytosis.  Normal lipase.  Elect lites are reassuring.  Normal kidney function.  Normal liver enzymes.  UA shows no signs of infection or RBCs.  CT scan was performed that showed no acute findings.  Does note chronic right distal 5 mm ureteral stone that is nonobstructing.  No hydronephrosis.   Doubt this is causing patient's pain as he has a history of the stone seen several times before and has no right-sided pain.  No urinary symptoms or blood in his urine.   Patient does not meet the SIRS or Sepsis criteria.  On repeat exam patient does not have a surgical abdomin and there are no peritoneal signs.  No indication of appendicitis, bowel obstruction, bowel perforation, cholecystitis, diverticulitis.  No cause of patient's symptoms.  Patient may benefit from GI follow-up for possible colonoscopy if symptoms do not improve.  Have referred patient back to his primary care doctor and GI doctor.   Patient discharged home with symptomatic treatment and given strict instructions for follow-up with their primary care physician.  I have also discussed reasons to return immediately to the ER.  Patient expresses understanding and agrees with plan.     Final Clinical Impressions(s) / ED Diagnoses   Final diagnoses:  Left lower quadrant pain    ED Discharge Orders        Ordered    ondansetron (ZOFRAN ODT) 4 MG disintegrating tablet  Every 8 hours PRN     03/11/18 1611    traMADol (ULTRAM) 50 MG tablet  Every 6 hours PRN     03/11/18 1611       Doristine Devoid, PA-C 03/12/18 1958    Charlesetta Shanks, MD 03/13/18 2132

## 2018-03-17 ENCOUNTER — Other Ambulatory Visit: Payer: Self-pay | Admitting: Family Medicine

## 2018-03-18 ENCOUNTER — Other Ambulatory Visit: Payer: Self-pay | Admitting: Adult Health

## 2018-03-18 ENCOUNTER — Other Ambulatory Visit: Payer: Self-pay | Admitting: Family Medicine

## 2018-03-26 ENCOUNTER — Emergency Department (HOSPITAL_COMMUNITY)
Admission: EM | Admit: 2018-03-26 | Discharge: 2018-03-26 | Disposition: A | Discharge status: Home / Self Care | Payer: PRIVATE HEALTH INSURANCE | Attending: Emergency Medicine | Admitting: Emergency Medicine

## 2018-03-26 ENCOUNTER — Other Ambulatory Visit: Payer: Self-pay

## 2018-03-26 ENCOUNTER — Encounter (HOSPITAL_COMMUNITY): Payer: Self-pay | Admitting: Emergency Medicine

## 2018-03-26 DIAGNOSIS — Z87891 Personal history of nicotine dependence: Secondary | ICD-10-CM | POA: Diagnosis not present

## 2018-03-26 DIAGNOSIS — N23 Unspecified renal colic: Secondary | ICD-10-CM | POA: Insufficient documentation

## 2018-03-26 DIAGNOSIS — I10 Essential (primary) hypertension: Secondary | ICD-10-CM | POA: Insufficient documentation

## 2018-03-26 DIAGNOSIS — Z79899 Other long term (current) drug therapy: Secondary | ICD-10-CM | POA: Diagnosis not present

## 2018-03-26 DIAGNOSIS — J45909 Unspecified asthma, uncomplicated: Secondary | ICD-10-CM | POA: Insufficient documentation

## 2018-03-26 DIAGNOSIS — R1031 Right lower quadrant pain: Secondary | ICD-10-CM | POA: Diagnosis present

## 2018-03-26 LAB — CBC
HEMATOCRIT: 43.2 % (ref 39.0–52.0)
Hemoglobin: 15.2 g/dL (ref 13.0–17.0)
MCH: 30 pg (ref 26.0–34.0)
MCHC: 35.2 g/dL (ref 30.0–36.0)
MCV: 85.2 fL (ref 78.0–100.0)
PLATELETS: 257 10*3/uL (ref 150–400)
RBC: 5.07 MIL/uL (ref 4.22–5.81)
RDW: 12.8 % (ref 11.5–15.5)
WBC: 10.1 10*3/uL (ref 4.0–10.5)

## 2018-03-26 LAB — COMPREHENSIVE METABOLIC PANEL
ALBUMIN: 4.1 g/dL (ref 3.5–5.0)
ALK PHOS: 63 U/L (ref 38–126)
ALT: 34 U/L (ref 17–63)
AST: 37 U/L (ref 15–41)
Anion gap: 10 (ref 5–15)
BILIRUBIN TOTAL: 1.9 mg/dL — AB (ref 0.3–1.2)
BUN: 6 mg/dL (ref 6–20)
CALCIUM: 9 mg/dL (ref 8.9–10.3)
CO2: 20 mmol/L — ABNORMAL LOW (ref 22–32)
CREATININE: 1.03 mg/dL (ref 0.61–1.24)
Chloride: 106 mmol/L (ref 101–111)
GFR calc Af Amer: >60 mL/min (ref >60)
GLUCOSE: 91 mg/dL (ref 65–99)
POTASSIUM: 3.7 mmol/L (ref 3.5–5.1)
Sodium: 136 mmol/L (ref 135–145)
TOTAL PROTEIN: 6.6 g/dL (ref 6.5–8.1)

## 2018-03-26 LAB — URINALYSIS, ROUTINE W REFLEX MICROSCOPIC
BACTERIA UA: NONE SEEN
Bilirubin Urine: NEGATIVE
Glucose, UA: NEGATIVE mg/dL
Ketones, ur: 5 mg/dL — AB
LEUKOCYTES UA: NEGATIVE
NITRITE: NEGATIVE
PH: 5 (ref 5.0–8.0)
Protein, ur: NEGATIVE mg/dL
SPECIFIC GRAVITY, URINE: 1.028 (ref 1.005–1.030)
Squamous Epithelial / LPF: NONE SEEN

## 2018-03-26 LAB — LIPASE, BLOOD: Lipase: 33 U/L (ref 11–51)

## 2018-03-26 MED ORDER — IBUPROFEN 600 MG PO TABS: 600.0000 mg | ORAL_TABLET | Freq: Four times a day (QID) | ORAL | 0 refills | Status: DC | PRN

## 2018-03-26 MED ORDER — TAMSULOSIN HCL 0.4 MG PO CAPS
0.4000 mg | ORAL_CAPSULE | Freq: Once | ORAL | Status: AC
  Administered 2018-03-26: 0.4 mg via ORAL
  Filled 2018-03-26: qty 1

## 2018-03-26 MED ORDER — TAMSULOSIN HCL 0.4 MG PO CAPS: 0.4000 mg | ORAL_CAPSULE | Freq: Every day | ORAL | 0 refills | Status: AC

## 2018-03-26 MED ORDER — HYDROCODONE-ACETAMINOPHEN 5-325 MG PO TABS: 0.5000 | ORAL_TABLET | Freq: Three times a day (TID) | ORAL | 0 refills | Status: AC | PRN

## 2018-03-26 NOTE — ED Notes (Signed)
Patient verbalizes understanding of discharge instructions. Opportunity for questioning and answers were provided. Armband removed by staff, pt discharged from ED.  

## 2018-03-26 NOTE — ED Triage Notes (Signed)
Sudden onset of severe RLQ pain 1 hour ago with nausea and dry heaves.  Pt reports known kidney stone for 2 years that he has not passed yet.  Denies urinary complaint at this time.  Took Vicodin x1 pta (approx 20 min ago).

## 2018-03-26 NOTE — ED Provider Notes (Signed)
[facility] EMERGENCY DEPARTMENT Provider Note  CSN: [id number] Arrival date & time: 03/26/18 1734  Chief Complaint(s) Abdominal Pain  HPI Jordan Buchanan is a 48 y.o. male    Abdominal Pain   This is a new problem. The current episode started 1 to 2 hours ago. The problem occurs constantly. The problem has been gradually improving (fluctuating). Associated with: known 5 mm right renal stone noted on CT last month. Pain location: right flank radiating to RLQ. The quality of the pain is cramping and aching. The pain is severe. Associated symptoms include nausea and hematuria. Pertinent negatives include fever, diarrhea, vomiting and dysuria. Nothing aggravates the symptoms. Relieved by: vicodin.    Past Medical History Past Medical History:  Diagnosis Date  . Arrhythmia    PALPITATIONS AND PVC - improved with cutting down on caffeine  . Cough variant asthma 05/28/2014   05/27/2014 p extensive coaching HFA effectiveness =    75% > try dulera 100 2bid  - PFT's 07/03/2014 FEV1 4.15 (98%) ratio 89 and no chang p B2 and nl fef25-75  And nl dlco    . Diverticulosis 07/2015   by CT scan  . GERD (gastroesophageal reflux disease)   . Hepatic steatosis   . Hypertension    h/o LVH, resolved  . Migraines   . Seasonal allergies   . 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 Estanislado Pandy)  . Tremor    Patient Active Problem List   Diagnosis Date Noted  . Hordeolum internum of right upper eyelid 02/28/2018  . History of asthma 04/28/2017  . History of migraine 04/27/2017  . Family history of multiple sclerosis 04/27/2017  . Former smoker 04/27/2017  . Influenza 02/16/2017  . Spondyloarthropathy ([place])   . OSA (obstructive sleep apnea) 08/19/2016  . Obesity 08/19/2016  . Low back pain radiating to left lower extremity 08/06/2016  . Morbid (severe) obesity due to excess calories ([place]) 04/21/2016  . Post-viral cough syndrome 03/10/2016  . Sty,  internal 03/03/2016  . LLQ abdominal pain 07/31/2015  . Daytime somnolence 07/31/2015  . Neck pain on right side 01/13/2015  . Cough variant asthma  vs VCD/ UACS  05/28/2014  . Pruritic condition 04/18/2014  . Right low back pain 09/18/2013  . Hepatic steatosis   . Prostatitis 05/09/2013  . Migraines   . Seasonal allergies   . GERD (gastroesophageal reflux disease)   . Hypertension    Home Medication(s) Prior to Admission medications   Medication Sig Start Date End Date Taking? Authorizing Provider  albuterol (VENTOLIN HFA) 108 (90 Base) MCG/ACT inhaler Inhale 2 puffs into the lungs every 4 (four) hours as needed for wheezing or shortness of breath. 03/30/17   Parrett, Fonnie Mu, NP  amLODipine (NORVASC) 10 MG tablet TAKE 1 TABLET (10 MG TOTAL) BY MOUTH DAILY. 03/20/18   Ria Bush, MD  cetirizine (ZYRTEC) 10 MG tablet Take 10 mg by mouth at bedtime.     [provider]  esomeprazole (NEXIUM) 40 MG capsule TAKE 1 CAPSULE BY MOUTH EVERY DAY BY MOUTH 03/17/18   Ria Bush, MD  fluticasone [facility]) 50 MCG/ACT nasal spray SPRAY 2 SPRAYS INTO EACH NOSTRIL EVERY DAY 03/20/18   Tanda Rockers, MD  HYDROcodone-acetaminophen (NORCO/VICODIN) 5-325 MG tablet Take 0.5-1 tablets by mouth every 8 (eight) hours as needed for up to 5 days for severe pain (That is not improved by your scheduled acetaminophen regimen). Please do not exceed 4000 mg of  acetaminophen (Tylenol) a 24-hour period. Please note that he may be prescribed additional medicine that contains acetaminophen. 03/26/18 03/31/18  Fatima Blank, MD  ibuprofen (ADVIL,MOTRIN) 600 MG tablet Take 1 tablet (600 mg total) by mouth every 6 (six) hours as needed. 03/26/18   Fatima Blank, MD  metoprolol succinate (TOPROL-XL) 50 MG 24 hr tablet TAKE 1 TABLET (50 MG TOTAL) BY MOUTH DAILY. 03/20/18   Ria Bush, MD  naproxen sodium (ANAPROX) 550 MG tablet Take one tablet twice daily with food for 5 days then as needed  01/31/18   Ria Bush, MD  ondansetron (ZOFRAN ODT) 4 MG disintegrating tablet Take 1 tablet (4 mg total) by mouth every 8 (eight) hours as needed for nausea or vomiting. 03/11/18   Doristine Devoid, PA-C  SYMBICORT 80-4.5 MCG/ACT inhaler  04/25/17   [provider]  tamsulosin (FLOMAX) 0.4 MG CAPS capsule Take 1 capsule (0.4 mg total) by mouth daily for 14 days. 03/26/18 04/09/18  Fatima Blank, MD  traMADol (ULTRAM) 50 MG tablet Take 1 tablet (50 mg total) by mouth every 6 (six) hours as needed. 03/11/18   Aaron Edelman                                                                                                                                    Past Surgical History Past Surgical History:  Procedure Laterality Date  . NASAL RECONSTRUCTION WITH SEPTAL REPAIR  2017   [place]  . OTHER SURGICAL HISTORY  1992   facial surgery  . ROTATOR CUFF REPAIR  2009   left  . UMBILICAL HERNIA REPAIR  05/10/2012   Procedure: HERNIA REPAIR UMBILICAL ADULT;  Surgeon: Harl Bowie, MD;  Location: [place];  Service: General;  Laterality: N/A;   Family History Family History  Problem Relation Age of Onset  . Asthma Father   . Arthritis Father        father, brother, Pgrandfather  . Chronic bronchitis Father   . Allergies Father   . Hypertension Brother        obese  . CAD Paternal Grandfather        MI x2  . Multiple sclerosis Brother   . Stroke Neg Hx   . Cancer Neg Hx     Social History Social History   Tobacco Use  . Smoking status: Former Smoker    Packs/day: 1.00    Years: 13.00    Pack years: 13.00    Types: Cigarettes    Last attempt to quit: 12/21/1999    Years since quitting: 18.2  . Smokeless tobacco: Former Systems developer    Types: Snuff    Quit date: 12/20/2013  Substance Use Topics  . Alcohol use: No    Alcohol/week: 0.0 oz    Comment: quit 2002  . Drug use: Yes    Types: Marijuana    Comment: quit 2002  Allergies Patient has no known allergies.  Review of Systems Review of Systems  Constitutional: Negative for fever.  Gastrointestinal: Positive for abdominal pain and nausea. Negative for diarrhea and vomiting.  Genitourinary: Positive for hematuria. Negative for dysuria.   All other systems are reviewed and are negative for acute change except as noted in the HPI  Physical Exam Vital Signs  I have reviewed the triage vital signs BP (!) 114/98 (BP Location: Left Arm)   Pulse 91   Temp 97.7 F (36.5 C) (Oral)   Resp 18   SpO2 100%   Physical Exam  Constitutional: He is oriented to person, place, and time. He appears well-developed and well-nourished. No distress.  HENT:  Head: Normocephalic and atraumatic.  Right Ear: External ear normal.  Left Ear: External ear normal.  Nose: Nose normal.  Mouth/Throat: Mucous membranes are normal. No trismus in the jaw.  Eyes: Conjunctivae and EOM are normal. No scleral icterus.  Neck: Normal range of motion and phonation normal.  Cardiovascular: Normal rate and regular rhythm.  Pulmonary/Chest: Effort normal. No stridor. No respiratory distress.  Abdominal: He exhibits no distension.  Musculoskeletal: Normal range of motion. He exhibits no edema.  Neurological: He is alert and oriented to person, place, and time.  Skin: He is not diaphoretic.  Psychiatric: He has a normal mood and affect. His behavior is normal.  Vitals reviewed.   ED Results and Treatments Labs (all labs ordered are listed, but only abnormal results are displayed) Labs Reviewed  COMPREHENSIVE METABOLIC PANEL - Abnormal; Notable for the following components:      Result Value   CO2 20 (*)    Total Bilirubin 1.9 (*)    All other components within normal limits  URINALYSIS, ROUTINE W REFLEX MICROSCOPIC - Abnormal; Notable for the following components:   Hgb urine dipstick MODERATE (*)    Ketones, ur 5 (*)    All other components within normal limits  LIPASE,  BLOOD  CBC                                                                                                                         EKG  EKG Interpretation  Date/Time:    Ventricular Rate:    PR Interval:    QRS Duration:   QT Interval:    QTC Calculation:   R Axis:     Text Interpretation:        Radiology No results found. Pertinent labs & imaging results that were available during my care of the patient were reviewed by me and considered in my medical decision making (see chart for details).  Medications Ordered in ED Medications  tamsulosin (FLOMAX) capsule 0.4 mg (0.4 mg Oral Given 03/26/18 1951)  Procedures Procedures EMERGENCY DEPARTMENT US RENAL EXAM  "Study: Limited Retroperitoneal Ultrasound of Kidneys"  INDICATIONS: Flank pain Long and short axis of both kidneys were obtained.   PERFORMED BY: Myself IMAGES ARCHIVED?: Yes LIMITATIONS: Body habitus VIEWS USED: Long axis and Short axis  INTERPRETATION: No Hydronephrosis    (including critical care time)  Medical Decision Making / ED Course I have reviewed the nursing notes for this encounter and the patient's prior records (if available in EHR or on provided paperwork).    Presentation consistent with renal colic.  Patient had a confirmed CT scan 1-1/2 weeks ago with a 5 mm right renal stone.  Bedside ultrasound without evidence of hydronephrosis.  Pain is now controlled with home Vicodin, which was the patient's daughter's medication from a while ago.  Labs grossly reassuring.  UA without evidence of infection.  The patient appears reasonably screened and/or stabilized for discharge and I doubt any other medical condition or other [facility] requiring further screening, evaluation, or treatment in the ED at this time prior to discharge.  The patient is safe for discharge with strict return  precautions.   Final Clinical Impression(s) / ED Diagnoses Final diagnoses:  Renal colic on right side   Disposition: Discharge  Condition: Good  I have discussed the results, Dx and Tx plan with the patient who expressed understanding and agree(s) with the plan. Discharge instructions discussed at great length. The patient was given strict return precautions who verbalized understanding of the instructions. No further questions at time of discharge.    ED Discharge Orders        Ordered    ibuprofen (ADVIL,MOTRIN) 600 MG tablet  Every 6 hours PRN     03/26/18 2102    tamsulosin (FLOMAX) 0.4 MG CAPS capsule  Daily     03/26/18 2102    HYDROcodone-acetaminophen (NORCO/VICODIN) 5-325 MG tablet  Every 8 hours PRN     03/26/18 2102       Follow Up: Ria Bush, MD [place] [address] [phone]  Call  As needed  Ardis Hughs, [place] [address] [phone]  Schedule an appointment as soon as possible for a visit        This chart was dictated using voice recognition software.  Despite best efforts to proofread,  errors can occur which can change the documentation meaning.   Fatima Blank, MD 03/26/18 2104

## 2018-03-27 ENCOUNTER — Telehealth: Payer: Self-pay | Admitting: Family Medicine

## 2018-03-27 NOTE — Telephone Encounter (Signed)
Pt was evaluated for this at ER yesterday.

## 2018-03-27 NOTE — Telephone Encounter (Signed)
Pts wife called [facility] on 03/26/18 b/c husband has been dx'd with kidney stones in the past and now it has flared up and is causing him a lot of pain.  Call was cancelled by request of pt.  They went to the ED.

## 2018-04-22 ENCOUNTER — Other Ambulatory Visit: Payer: Self-pay | Admitting: Internal Medicine

## 2018-04-24 ENCOUNTER — Ambulatory Visit: Payer: PRIVATE HEALTH INSURANCE | Admitting: Family Medicine

## 2018-04-24 ENCOUNTER — Encounter: Payer: Self-pay | Admitting: Family Medicine

## 2018-04-24 VITALS — BP 130/82 | HR 64 | Temp 97.8°F | Ht 70.0 in | Wt 237.5 lb

## 2018-04-24 DIAGNOSIS — R21 Rash and other nonspecific skin eruption: Secondary | ICD-10-CM | POA: Insufficient documentation

## 2018-04-24 DIAGNOSIS — L509 Urticaria, unspecified: Secondary | ICD-10-CM | POA: Diagnosis not present

## 2018-04-24 DIAGNOSIS — J45991 Cough variant asthma: Secondary | ICD-10-CM

## 2018-04-24 MED ORDER — BUDESONIDE-FORMOTEROL FUMARATE 160-4.5 MCG/ACT IN AERO: 2.0000 | INHALATION_SPRAY | Freq: Two times a day (BID) | RESPIRATORY_TRACT | 1 refills | Status: DC

## 2018-04-24 MED ORDER — PREDNISONE 10 MG PO TABS: ORAL_TABLET | ORAL | 0 refills | Status: DC

## 2018-04-24 NOTE — Progress Notes (Signed)
Subjective:    Patient ID: Jordan Buchanan, male    DOB: 02/13/1971, 47 y.o.   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 Histamine mediated rash     BP Readings from Last 3 Encounters:  04/24/18 (!) 148/90  03/26/18 121/81  03/11/18 115/81  on amlodipine - ? Cause swelling  Was on hctz - ? Rxn in the sun  BP: 130/82   Patient Active Problem List   Diagnosis Date Noted  . Urticaria 04/24/2018  . Rash and nonspecific skin eruption 04/24/2018  . Hordeolum internum of right upper eyelid 02/28/2018  . History of asthma 04/28/2017  . History of migraine 04/27/2017  . Family history of multiple sclerosis 04/27/2017  . Former smoker 04/27/2017  . Influenza 02/16/2017  . Spondyloarthropathy ([place])   . OSA (obstructive sleep apnea) 08/19/2016  . Obesity 08/19/2016  . Low back pain radiating to left lower extremity 08/06/2016  . Morbid (severe) obesity due to excess calories ([place]) 04/21/2016  . Post-viral cough syndrome 03/10/2016  . Sty, internal 03/03/2016  . LLQ abdominal pain 07/31/2015  . Daytime somnolence 07/31/2015  . Neck pain on right side 01/13/2015  . Cough variant asthma  vs VCD/ UACS  05/28/2014  . Pruritic condition 04/18/2014  . Right low back pain 09/18/2013  . Hepatic steatosis   . Prostatitis 05/09/2013  . Migraines   . Seasonal allergies   . GERD (gastroesophageal reflux disease)   . Hypertension    Past Medical History:  Diagnosis Date  . Arrhythmia    PALPITATIONS AND PVC - improved with cutting down on caffeine  . Cough variant asthma 05/28/2014   05/27/2014 p extensive  coaching HFA effectiveness =    75% > try dulera 100 2bid  - PFT's 07/03/2014 FEV1 4.15 (98%) ratio 89 and no chang p B2 and nl fef25-75  And nl dlco    . Diverticulosis 07/2015   by CT scan  . GERD (gastroesophageal reflux disease)   . Hepatic steatosis   . Hypertension    h/o LVH, resolved  . Migraines   . Seasonal allergies   . 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 Estanislado Pandy)  . Tremor    Past Surgical History:  Procedure Laterality Date  . NASAL RECONSTRUCTION WITH SEPTAL REPAIR  2017   [place]  . OTHER SURGICAL HISTORY  1992   facial surgery  . ROTATOR CUFF REPAIR  2009   left  . UMBILICAL HERNIA REPAIR  05/10/2012   Procedure: HERNIA REPAIR UMBILICAL ADULT;  Surgeon: Harl Bowie, MD;  Location: [place];  Service: General;  Laterality: N/A;   Social History   Tobacco Use  . Smoking status: Former Smoker    Packs/day: 1.00    Years: 13.00    Pack years: 13.00    Types: Cigarettes    Last attempt to quit: 12/21/1999    Years since quitting: 18.3  . Smokeless tobacco: Former Systems developer    Types: Snuff  Quit date: 12/20/2013  Substance Use Topics  . Alcohol use: No    Alcohol/week: 0.0 oz    Comment: quit 2002  . Drug use: Yes    Types: Marijuana    Comment: quit 2002   Family History  Problem Relation Age of Onset  . Asthma Father   . Arthritis Father        father, brother, Pgrandfather  . Chronic bronchitis Father   . Allergies Father   . Hypertension Brother        obese  . CAD Paternal Grandfather        MI x2  . Multiple sclerosis Brother   . Stroke Neg Hx   . Cancer Neg Hx    No Known Allergies Current Outpatient Medications on File Prior to Visit  Medication Sig Dispense Refill  . albuterol (VENTOLIN HFA) 108 (90 Base) MCG/ACT inhaler Inhale 2 puffs into the lungs every 4 (four) hours as needed for wheezing or shortness of breath. 1 Inhaler 5  . amLODipine (NORVASC) 10 MG tablet TAKE 1 TABLET  (10 MG TOTAL) BY MOUTH DAILY. 90 tablet 2  . cetirizine (ZYRTEC) 10 MG tablet Take 10 mg by mouth at bedtime.     Marland Kitchen esomeprazole (NEXIUM) 40 MG capsule TAKE 1 CAPSULE BY MOUTH EVERY DAY BY MOUTH 90 capsule 0  . fluticasone (FLONASE) 50 MCG/ACT nasal spray SPRAY 2 SPRAYS INTO EACH NOSTRIL EVERY DAY 16 g 0  . ibuprofen (ADVIL,MOTRIN) 600 MG tablet Take 1 tablet (600 mg total) by mouth every 6 (six) hours as needed. 30 tablet 0  . metoprolol succinate (TOPROL-XL) 50 MG 24 hr tablet TAKE 1 TABLET (50 MG TOTAL) BY MOUTH DAILY. 90 tablet 0  . naproxen sodium (ANAPROX) 550 MG tablet Take one tablet twice daily with food for 5 days then as needed 50 tablet 0  . ondansetron (ZOFRAN ODT) 4 MG disintegrating tablet Take 1 tablet (4 mg total) by mouth every 8 (eight) hours as needed for nausea or vomiting. 8 tablet 0  . traMADol (ULTRAM) 50 MG tablet Take 1 tablet (50 mg total) by mouth every 6 (six) hours as needed. 10 tablet 0   No current facility-administered medications on file prior to visit.     Review of Systems  Constitutional: Negative for activity change, appetite change, fatigue, fever and unexpected weight change.  HENT: Negative for congestion, rhinorrhea, sore throat and trouble swallowing.   Eyes: Negative for pain, redness, itching and visual disturbance.  Respiratory: Negative for cough, chest tightness, shortness of breath and wheezing.   Cardiovascular: Negative for chest pain and palpitations.  Gastrointestinal: Negative for abdominal pain, blood in stool, constipation, diarrhea and nausea.  Endocrine: Negative for cold intolerance, heat intolerance, polydipsia and polyuria.  Genitourinary: Negative for difficulty urinating, dysuria, frequency and urgency.  Musculoskeletal: Negative for arthralgias, joint swelling and myalgias.  Skin: Positive for rash. Negative for pallor.  Neurological: Negative for dizziness, tremors, weakness, numbness and headaches.  Hematological: Negative for  adenopathy. Does not bruise/bleed easily.  Psychiatric/Behavioral: Negative for decreased concentration and dysphoric mood. The patient is not nervous/anxious.        Objective:   Physical Exam  Constitutional: He appears well-developed and well-nourished. No distress.  Well appearing  HENT:  Head: Normocephalic and atraumatic.  Nose: Nose normal.  Mouth/Throat: Oropharynx is clear and moist.  Nares are boggy No oral swelling  Eyes: Pupils are equal, round, and reactive to light. Conjunctivae and EOM are normal. Right eye exhibits no  discharge. Left eye exhibits no discharge. No scleral icterus.  Neck: Normal range of motion. Neck supple.  Cardiovascular: Normal rate, regular rhythm and normal heart sounds.  Pulmonary/Chest: Effort normal and breath sounds normal. No stridor. No respiratory distress. He has no wheezes. He has no rales.  Abdominal: Soft. Bowel sounds are normal. There is no tenderness.  Neurological: He is alert. No cranial nerve deficit.  Skin: Skin is warm and dry. Rash noted. He is not diaphoretic.  Urticarial rash on arms and abdomen and inner thighs-some areas confluent (no skin breakdown)  R lower leg and ankle- dried papules/few scabs and excoriations  No s/s of bacterial infection  No drainage   Psychiatric: He has a normal mood and affect.          Assessment & Plan:   Problem List Items Addressed This Visit      Respiratory   Cough variant asthma  vs VCD/ UACS     Pt states he needs refill of high dose symbicort before pcp gets back in town        Relevant Medications   predniSONE (DELTASONE) 10 MG tablet   budesonide-formoterol (SYMBICORT) 160-4.5 MCG/ACT inhaler     Musculoskeletal and Integument   Rash and nonspecific skin eruption    Healing papules-some scabbing R ankle and lower leg  Suspect dermatitis from allergic exposure  Has clobetasol cream to try  Clean with soap and water  Prednisone (for 2nd rash) should help       Urticaria - Primary    Arms/inner thighs and abdomen (warm areas) has appearance of small confluent hives No angioedema Seasonal- ? Due to pollen exp Disc avoidance of chemicals/fragrances pred taper 30 mg  Update if not starting to improve in a week or if worsening

## 2018-04-24 NOTE — Assessment & Plan Note (Signed)
Healing papules-some scabbing R ankle and lower leg  Suspect dermatitis from allergic exposure  Has clobetasol cream to try  Clean with soap and water  Prednisone (for 2nd rash) should help

## 2018-04-24 NOTE — Patient Instructions (Signed)
I think the prednisone will help both of your rashes Continue zyrtec  Keep skin clean and dry   If no improvement or if rash worsens let us know   Watch out for poison ivy/etc   Hope that once pollen settles down things will improve

## 2018-04-24 NOTE — Assessment & Plan Note (Signed)
Pt states he needs refill of high dose symbicort before pcp gets back in town

## 2018-04-24 NOTE — Assessment & Plan Note (Signed)
Arms/inner thighs and abdomen (warm areas) has appearance of small confluent hives No angioedema Seasonal- ? Due to pollen exp Disc avoidance of chemicals/fragrances pred taper 30 mg  Update if not starting to improve in a week or if worsening

## 2018-05-06 ENCOUNTER — Other Ambulatory Visit: Payer: Self-pay | Admitting: Internal Medicine

## 2018-05-08 ENCOUNTER — Ambulatory Visit: Payer: PRIVATE HEALTH INSURANCE | Admitting: Internal Medicine

## 2018-05-08 ENCOUNTER — Other Ambulatory Visit (INDEPENDENT_AMBULATORY_CARE_PROVIDER_SITE_OTHER): Payer: PRIVATE HEALTH INSURANCE

## 2018-05-08 ENCOUNTER — Encounter: Payer: Self-pay | Admitting: Internal Medicine

## 2018-05-08 VITALS — BP 120/80 | HR 68 | Ht 70.0 in | Wt 235.0 lb

## 2018-05-08 DIAGNOSIS — J45991 Cough variant asthma: Secondary | ICD-10-CM | POA: Diagnosis not present

## 2018-05-08 DIAGNOSIS — R21 Rash and other nonspecific skin eruption: Secondary | ICD-10-CM

## 2018-05-08 LAB — CBC WITH DIFFERENTIAL/PLATELET
BASOS PCT: 0.7 % (ref 0.0–3.0)
Basophils Absolute: 0.1 10*3/uL (ref 0.0–0.1)
Eosinophils Absolute: 0.2 10*3/uL (ref 0.0–0.7)
Eosinophils Relative: 2.2 % (ref 0.0–5.0)
HEMATOCRIT: 45.9 % (ref 39.0–52.0)
HEMOGLOBIN: 15.9 g/dL (ref 13.0–17.0)
LYMPHS PCT: 34.2 % (ref 12.0–46.0)
Lymphs Abs: 3.2 10*3/uL (ref 0.7–4.0)
MCHC: 34.6 g/dL (ref 30.0–36.0)
MCV: 88.4 fl (ref 78.0–100.0)
MONO ABS: 0.6 10*3/uL (ref 0.1–1.0)
MONOS PCT: 6.7 % (ref 3.0–12.0)
Neutro Abs: 5.3 10*3/uL (ref 1.4–7.7)
Neutrophils Relative %: 56.2 % (ref 43.0–77.0)
Platelets: 262 10*3/uL (ref 150.0–400.0)
RBC: 5.19 Mil/uL (ref 4.22–5.81)
RDW: 13.4 % (ref 11.5–15.5)
WBC: 9.4 10*3/uL (ref 4.0–10.5)

## 2018-05-08 MED ORDER — PREDNISONE 10 MG PO TABS: ORAL_TABLET | ORAL | 0 refills | Status: DC

## 2018-05-08 MED ORDER — BUDESONIDE-FORMOTEROL FUMARATE 80-4.5 MCG/ACT IN AERO: INHALATION_SPRAY | RESPIRATORY_TRACT | 11 refills | Status: DC

## 2018-05-08 NOTE — Patient Instructions (Addendum)
Decrease the symbicort to 80 strength and   Take 2 puffs first thing in am and then another 2 puffs about 12 hours later - if you find you need it chronically then ok to continue   Work on perfecting  inhaler technique:  relax and gently blow all the way out then take a nice smooth deep breath back in, triggering the inhaler at same time you start breathing in.  Hold for up to 5 seconds if you can. Blow out thru nose. Rinse and gargle with water when done  If getting worse with breathing or rash: Prednisone 10 mg take  4 each am x 2 days,   2 each am x 2 days,  1 each am x 2 days and stop      Please see patient coordinator before you leave today  to schedule allergy eval    Please remember to go to the lab department downstairs in the basement  for your tests - we will call you with the results when they are available.  Pulmonary follow up is as needed

## 2018-05-08 NOTE — Progress Notes (Signed)
Subjective:   Patient ID: Jordan Buchanan, male    DOB: [birth year]   MRN: [id number]   Brief patient profile:  62 yowm quit smoking 2001  with ? pna 4/5th grade but completely back to nl = competitive sports and some sob while smoking but not a big deal, had some tendency to sinus infections in last decade attributed to outdoor greenskeeping but in early May 2015 onset of nasal congestion/ sore throat > eval by Dr Leo Grosser with neg strep test rx with augmentin some better then severe cough then rx zpak / pred x 2 then worse in ER June 5th 2015 rx with albuterol by Dr Jeanell Sparrow and referred to pulmonary clinic 05/27/14     History of Present Illness  05/27/2014 1st [facility] office visit/ Jordan Buchanan  Re cough x early May 2015  Chief Complaint  Patient presents with  . Pulmonary Consult    Self referral. Pt c/o SOB and cough for the past month. Cough is mainly dry, but produces minimal sputum in the am's- ? color.  He is using ventolin HFA every 4 hrs.    on prilosec daily then eat 4 h later, ventolin helps transiently with cough and breathing  >dulera , PPI and delsym , steroid taper    07/03/2014 f/u ov/Jordan Buchanan re: ?cough variant / rx gerd rx plus  dulera 100 2bid, no need for saba  Chief Complaint  Patient presents with  . Follow-up    Pt reports that his cough is much improved. Rash has resolved. No new co's today.   Not limited by breathing from desired activities    Not needing any cough suppression rec dulera 100 to up 2 puffs every 12 hours, ok to adjust down and off if doing great.  F/u prn   June 2017 Sinus Surgery Dr Ernesto Rutherford for nasal obst > corrected     04/05/2017  f/u ov/Jordan Buchanan re: refractory cough/ chest tightness only better on high dose pred/ maint on symb 160 / no longer on gerd rx  Chief Complaint  Patient presents with  . Acute Visit    Increased chest tightness since tapering down on prednisone. He states his chest feels "restricted".     was 100% better p my last ov  with him in 04/2016 and fine until acutely ill in Feb 2018 in setting of viral illness not maintained on any asthma maint intrx in interim Not back to baseline since flu in [facility] Feb 2018 with sensation of globus/chest tightness day > noct and no better even with saba   rec Prednisone 10 mg take  4 each am x 2 days,   2 each am x 2 days,  1 each am x 2 days and stop  Stop symbicort  Only use your albuterol as a rescue medication Take delsym two tsp every 12 hours and supplement if needed with  tramadol 50 mg up to 2 every 4 hours .   nexium 40 mg Take 30- 60 min before your first and last meals of the day until no cough or tightness for at least two week then resume previous dose   GERD     05/08/2018  Acute f/u  ov/Jordan Buchanan re: sob/ rash  Chief Complaint  Patient presents with  . Acute Visit    Increased SOB and chest tightness for the past month.  He states before these symptoms started, he had a rash "all over"- better after taking prednisone. He has been using his albuterol inhaler 5  x per wk on average.   Dyspnea:  Not limited by breathing from desired activities gradually worse since onset of rash / better with zyrtec And transiently better with prednisone  Cough: none/ some globus  Sleep: ok  SABA use:  Over the last year no need for saba/ symbicort helps tightness when he takes it consistently      No obvious day to day or daytime variability or assoc excess/ purulent sputum or mucus plugs or hemoptysis or cp or chest tightness, subjective wheeze or overt sinus or hb symptoms. No unusual exposure hx or h/o childhood  asthma or knowledge of premature birth.  Sleeping  Ok   without nocturnal  or early am exacerbation  of respiratory  c/o's or need for noct saba. Also denies any obvious fluctuation of symptoms with weather or environmental changes or other aggravating or alleviating factors except as outlined above   Current Allergies, Complete Past Medical History, Past Surgical History,  Family History, and Social History were reviewed in [employer] record.  ROS  The following are not active complaints unless bolded Hoarseness, sore throat, dysphagia, dental problems, itching, sneezing,  nasal congestion or discharge of excess mucus or purulent secretions, ear ache,   fever, chills, sweats, unintended wt loss or wt gain, classically pleuritic or exertional cp,  orthopnea pnd or arm/hand swelling  or leg swelling, presyncope, palpitations, abdominal pain, anorexia, nausea, vomiting, diarrhea  or change in bowel habits or change in bladder habits, change in stools or change in urine, dysuria, hematuria,  rash, arthralgias, visual complaints, headache, numbness, weakness or ataxia or problems with walking or coordination,  change in mood or  memory.        Current Meds  Medication Sig  . albuterol (VENTOLIN HFA) 108 (90 Base) MCG/ACT inhaler Inhale 2 puffs into the lungs every 4 (four) hours as needed for wheezing or shortness of breath.  Marland Kitchen amLODipine (NORVASC) 10 MG tablet TAKE 1 TABLET (10 MG TOTAL) BY MOUTH DAILY.  . cetirizine (ZYRTEC) 10 MG tablet Take 10 mg by mouth at bedtime.   Marland Kitchen esomeprazole (NEXIUM) 40 MG capsule TAKE 1 CAPSULE BY MOUTH EVERY DAY BY MOUTH  . fluticasone (FLONASE) 50 MCG/ACT nasal spray SPRAY 2 SPRAYS INTO EACH NOSTRIL EVERY DAY  . metoprolol succinate (TOPROL-XL) 50 MG 24 hr tablet TAKE 1 TABLET (50 MG TOTAL) BY MOUTH DAILY.  . naproxen sodium (ANAPROX) 550 MG tablet Take one tablet twice daily with food for 5 days then as needed  . [  budesonide-formoterol (SYMBICORT) 160-4.5 MCG/ACT inhaler Inhale 2 puffs into the lungs 2 (two) times daily.              Objective:   Physical Exam   amb somewhat anxious  wm nad   05/08/2018      235  04/05/2017       234 04/20/2016         231  04/12/16 236 lb (107.049 kg)  04/01/16 231 lb 8 oz (105.008 kg)  03/10/16 233 lb 9.6 oz (105.96 kg)       Vital signs reviewed - Note on  arrival 02 sats  07% on RA   HEENT: nl dentition, turbinates bilaterally, and oropharynx. Nl external ear canals without cough reflex   NECK :  without JVD/Nodes/TM/ nl carotid upstrokes bilaterally   LUNGS: no acc muscle use,  Nl contour chest which is clear to A and P bilaterally without cough on insp or exp maneuvers  CV:  RRR  no s3 or murmur or increase in P2, and no edema   ABD:  soft and nontender with nl inspiratory excursion in the supine position. No bruits or organomegaly appreciated, bowel sounds nl  MS:  Nl gait/ ext warm without deformities, calf tenderness, cyanosis or clubbing No obvious joint restrictions   SKIN: warm and dry with erythematous maculopapular rash on both forearms urticarial-like.   NEURO:  alert, approp, nl sensorium with  no motor or cerebellar deficits apparent.                   Assessment & Plan:

## 2018-05-09 LAB — RESPIRATORY ALLERGY PROFILE REGION II ~~LOC~~
Allergen, Cedar tree, t12: <0.1 kU/L
Allergen, Comm Silver Birch, t9: <0.1 kU/L
Allergen, Cottonwood, t14: <0.1 kU/L
Allergen, D pternoyssinus,d7: <0.1 kU/L
Allergen, Mulberry, t76: <0.1 kU/L
Allergen, Oak,t7: <0.1 kU/L
Allergen, P. notatum, m1: <0.1 kU/L
Aspergillus fumigatus, m3: <0.1 kU/L
Bermuda Grass: <0.1 kU/L
CLADOSPORIUM HERBARUM (M2) IGE: <0.1 kU/L
CLASS: 0
CLASS: 0
CLASS: 0
CLASS: 0
CLASS: 0
CLASS: 0
CLASS: 0
CLASS: 0
CLASS: 0
CLASS: 0
CLASS: 0
CLASS: 0
CLASS: 0
COMMON RAGWEED (SHORT) (W1) IGE: <0.1 kU/L
Cat Dander: <0.1 kU/L
Class: 0
Class: 0
Class: 0
Class: 0
Class: 0
Class: 0
Class: 0
Class: 0
Class: 0
Class: 0
Class: 0
D. farinae: <0.1 kU/L
Dog Dander: <0.1 kU/L
IgE (Immunoglobulin E), Serum: 26 kU/L (ref <=114)
Johnson Grass: <0.1 kU/L
Pecan/Hickory Tree IgE: <0.1 kU/L
Rough Pigweed  IgE: <0.1 kU/L
Timothy Grass: <0.1 kU/L

## 2018-05-09 LAB — INTERPRETATION:

## 2018-05-10 ENCOUNTER — Encounter: Payer: Self-pay | Admitting: Internal Medicine

## 2018-05-10 NOTE — Assessment & Plan Note (Signed)
Referred to allergy. If the problem gets worse in the interim he can take a 6 day course of prednisone. Otherwise he should just use antihistamines is artery recommended by Dr. Glori Bickers.

## 2018-05-10 NOTE — Assessment & Plan Note (Addendum)
-   Allergy profile 04/12/2016 >  Eos 0.2 /  IgE  25 neg RAST  - 04/12/2016  After extensive coaching HFA effectiveness =    75% > rechallenge with dulera 100 2bid samples> no better 04/20/2016 so d/cd  - Spirometry 04/20/2016  wnl including fef 25-75  - CT sinus 04/23/2016 > Minimal mucosal thickening within the maxillary sinuses. No evidence of acute sinusitis. - 04/05/2017  FENO = 8 on symb 160 2bid  - Spirometry 04/05/2017  wnl including curvature with active symptoms  - Allergy profile  05/08/18  >  Eos 0.2 /  IgE  25 RAST neg   05/08/2018  After extensive coaching inhaler device  effectiveness =    90%    Despite apparent urticarial rash   asthma component  is actually well controlled chronically with minimal need for saba .  Contingencies discussed in full including contacting this office immediately if not controlling the symptoms using the rule of two's.> has one course of prednisone he can go ahead and take as an action plan.   Should be able to do step down therapy to Symbicort 80 dose 2 puffs every 12 hours  Once he establishes with allergy than we can see him here on an as-needed basis.  I had an extended discussion with the patient reviewing all relevant studies completed to date and  lasting 15 to 20 minutes of a 25 minute visit    See device teaching which extended face to face time for this visit.  Each maintenance medication was reviewed in detail including emphasizing most importantly the difference between maintenance and prns and under what circumstances the prns are to be triggered using an action plan format that is not reflected in the computer generated alphabetically organized AVS which I have not found useful in most complex patients, especially with respiratory illnesses  Please see AVS for specific instructions unique to this visit that I personally wrote and verbalized to the the pt in detail and then reviewed with pt  by my nurse highlighting any  changes in therapy  recommended at today's visit to their plan of care.

## 2018-05-10 NOTE — Progress Notes (Signed)
Left detailed msg with results

## 2018-06-04 ENCOUNTER — Other Ambulatory Visit: Payer: Self-pay

## 2018-06-04 ENCOUNTER — Emergency Department (HOSPITAL_BASED_OUTPATIENT_CLINIC_OR_DEPARTMENT_OTHER)
Admission: EM | Admit: 2018-06-04 | Discharge: 2018-06-04 | Disposition: A | Discharge status: Home / Self Care | Payer: PRIVATE HEALTH INSURANCE | Attending: Emergency Medicine | Admitting: Emergency Medicine

## 2018-06-04 ENCOUNTER — Encounter (HOSPITAL_BASED_OUTPATIENT_CLINIC_OR_DEPARTMENT_OTHER): Payer: Self-pay | Admitting: Emergency Medicine

## 2018-06-04 ENCOUNTER — Emergency Department (HOSPITAL_BASED_OUTPATIENT_CLINIC_OR_DEPARTMENT_OTHER): Payer: PRIVATE HEALTH INSURANCE

## 2018-06-04 DIAGNOSIS — R079 Chest pain, unspecified: Secondary | ICD-10-CM | POA: Diagnosis present

## 2018-06-04 DIAGNOSIS — I10 Essential (primary) hypertension: Secondary | ICD-10-CM | POA: Diagnosis not present

## 2018-06-04 DIAGNOSIS — Z79899 Other long term (current) drug therapy: Secondary | ICD-10-CM | POA: Insufficient documentation

## 2018-06-04 DIAGNOSIS — Z87891 Personal history of nicotine dependence: Secondary | ICD-10-CM | POA: Insufficient documentation

## 2018-06-04 LAB — CBC WITH DIFFERENTIAL/PLATELET
BASOS PCT: 1 %
Basophils Absolute: 0 10*3/uL (ref 0.0–0.1)
EOS ABS: 0.3 10*3/uL (ref 0.0–0.7)
Eosinophils Relative: 4 %
HCT: 43.6 % (ref 39.0–52.0)
HEMOGLOBIN: 16 g/dL (ref 13.0–17.0)
Lymphocytes Relative: 35 %
Lymphs Abs: 2.8 10*3/uL (ref 0.7–4.0)
MCH: 30.5 pg (ref 26.0–34.0)
MCHC: 36.7 g/dL — AB (ref 30.0–36.0)
MCV: 83.2 fL (ref 78.0–100.0)
MONOS PCT: 10 %
Monocytes Absolute: 0.8 10*3/uL (ref 0.1–1.0)
Neutro Abs: 3.9 10*3/uL (ref 1.7–7.7)
Neutrophils Relative %: 50 %
Platelets: 250 10*3/uL (ref 150–400)
RBC: 5.24 MIL/uL (ref 4.22–5.81)
RDW: 13 % (ref 11.5–15.5)
WBC: 7.8 10*3/uL (ref 4.0–10.5)

## 2018-06-04 LAB — BASIC METABOLIC PANEL
ANION GAP: 10 (ref 5–15)
BUN: 7 mg/dL (ref 6–20)
CALCIUM: 8.6 mg/dL — AB (ref 8.9–10.3)
CO2: 22 mmol/L (ref 22–32)
Chloride: 105 mmol/L (ref 101–111)
Creatinine, Ser: 0.87 mg/dL (ref 0.61–1.24)
Glucose, Bld: 108 mg/dL — ABNORMAL HIGH (ref 65–99)
Potassium: 4.1 mmol/L (ref 3.5–5.1)
SODIUM: 137 mmol/L (ref 135–145)

## 2018-06-04 LAB — TROPONIN I

## 2018-06-04 LAB — D-DIMER, QUANTITATIVE (NOT AT ARMC)

## 2018-06-04 NOTE — ED Provider Notes (Signed)
[facility] DE[facility] Provider Note   CSN: [id number] Arrival date & time: 06/04/18  1132     History   Chief Complaint Chief Complaint  Patient presents with  . Chest Pain    HPI Jordan Buchanan is a 47 y.o. male with a hx of GERD, HTN, migraines, seasonal allergies, OSA, and obesity who presents to the ED with complaints of chest pain which has been intermittent for past 3 weeks, worse over past 2-3 days. Patient states he has had intermittent pain to the substernal region that would last a few hours then resolve on and off over past 3 weeks. Over past 2-3 days the pain has become constant and feels as though it is progressively worsening. Pain remains in substernal region, does not seem to radiate, described as achy. He states pain seems to be related to inspiration/expiration and is worse with a deep breath and with coughing. Has also had dry cough as well as dyspnea for past 3 weeks. Dyspnea is worse with exertion, as he starts to breath more he feels the chest pain gets worse with the increased breathing. He has seen pulmonology- tried albuterol inhaler without improvement, he additionally tried aspirin without improvement. He had echocardiogram performed 06/11. Denies fever, chills, nausea, vomiting, diaphoresis, lightheadedness, dizziness, or syncope. Denies hemoptysis, recent surgery/trauma, recent long travel, hormone use, personal hx of cancer, or hx of DVT/PE. He states that he does have intermittent bilateral leg swelling, no pain, not atypical for him and has been ongoing, not particularly at present. Denies orthopnea. Additionally patient with hx of OSA, not using cpap, has plan for repeat sleep study.    HPI  Past Medical History:  Diagnosis Date  . Arrhythmia    PALPITATIONS AND PVC - improved with cutting down on caffeine  . Cough variant asthma 05/28/2014   05/27/2014 p extensive coaching HFA effectiveness =    75% > try dulera 100 2bid  - PFT's 07/03/2014  FEV1 4.15 (98%) ratio 89 and no chang p B2 and nl fef25-75  And nl dlco    . Diverticulosis 07/2015   by CT scan  . GERD (gastroesophageal reflux disease)   . Hepatic steatosis   . Hypertension    h/o LVH, resolved  . Migraines   . Seasonal allergies   . 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 Estanislado Pandy)  . Tremor     Patient Active Problem List   Diagnosis Date Noted  . Urticaria 04/24/2018  . Rash and nonspecific skin eruption 04/24/2018  . Hordeolum internum of right upper eyelid 02/28/2018  . History of asthma 04/28/2017  . History of migraine 04/27/2017  . Family history of multiple sclerosis 04/27/2017  . Former smoker 04/27/2017  . Influenza 02/16/2017  . Spondyloarthropathy ([place])   . OSA (obstructive sleep apnea) 08/19/2016  . Obesity 08/19/2016  . Low back pain radiating to left lower extremity 08/06/2016  . Morbid (severe) obesity due to excess calories ([place]) 04/21/2016  . Post-viral cough syndrome 03/10/2016  . Sty, internal 03/03/2016  . LLQ abdominal pain 07/31/2015  . Daytime somnolence 07/31/2015  . Neck pain on right side 01/13/2015  . Cough variant asthma  vs VCD/ UACS  05/28/2014  . Pruritic condition 04/18/2014  . Right low back pain 09/18/2013  . Hepatic steatosis   . Prostatitis 05/09/2013  . Migraines   . Seasonal allergies   . GERD (gastroesophageal reflux disease)   . Hypertension  Past Surgical History:  Procedure Laterality Date  . NASAL RECONSTRUCTION WITH SEPTAL REPAIR  2017   [facility]  . OTHER SURGICAL HISTORY  1992   facial surgery  . ROTATOR CUFF REPAIR  2009   left  . UMBILICAL HERNIA REPAIR  05/10/2012   Procedure: HERNIA REPAIR UMBILICAL ADULT;  Surgeon: Harl Bowie, MD;  Location: [facility];  Service: General;  Laterality: N/A;        Home Medications    Prior to Admission medications   Medication Sig Start Date End Date Taking? Authorizing Provider  albuterol  (VENTOLIN HFA) 108 (90 Base) MCG/ACT inhaler Inhale 2 puffs into the lungs every 4 (four) hours as needed for wheezing or shortness of breath. 03/30/17   Parrett, Fonnie Mu, NP  amLODipine (NORVASC) 10 MG tablet TAKE 1 TABLET (10 MG TOTAL) BY MOUTH DAILY. 03/20/18   Ria Bush, MD  budesonide-formoterol [facility]) 80-4.5 MCG/ACT inhaler Take 2 puffs first thing in am and then another 2 puffs about 12 hours later. 05/08/18   Tanda Rockers, MD  cetirizine (ZYRTEC) 10 MG tablet Take 10 mg by mouth at bedtime.     [provider]  esomeprazole (NEXIUM) 40 MG capsule TAKE 1 CAPSULE BY MOUTH EVERY DAY BY MOUTH 03/17/18   Ria Bush, MD  fluticasone [facility]) 50 MCG/ACT nasal spray SPRAY 2 SPRAYS INTO EACH NOSTRIL EVERY DAY 03/20/18   Tanda Rockers, MD  metoprolol succinate (TOPROL-XL) 50 MG 24 hr tablet TAKE 1 TABLET (50 MG TOTAL) BY MOUTH DAILY. 03/20/18   Ria Bush, MD  naproxen sodium (ANAPROX) 550 MG tablet Take one tablet twice daily with food for 5 days then as needed 01/31/18   Ria Bush, MD  predniSONE (DELTASONE) 10 MG tablet Take  4 each am x 2 days,   2 each am x 2 days,  1 each am x 2 days and stop 05/08/18   Tanda Rockers, MD    Family History Family History  Problem Relation Age of Onset  . Asthma Father   . Arthritis Father        father, brother, Pgrandfather  . Chronic bronchitis Father   . Allergies Father   . Hypertension Brother        obese  . CAD Paternal Grandfather        MI x2  . Multiple sclerosis Brother   . Stroke Neg Hx   . Cancer Neg Hx     Social History Social History   Tobacco Use  . Smoking status: Former Smoker    Packs/day: 1.00    Years: 13.00    Pack years: 13.00    Types: Cigarettes    Last attempt to quit: 12/21/1999    Years since quitting: 18.4  . Smokeless tobacco: Former Systems developer    Types: Snuff    Quit date: 12/20/2013  Substance Use Topics  . Alcohol use: No    Alcohol/week: 0.0 oz    Comment: quit 2002  .  Drug use: Yes    Types: Marijuana    Comment: quit 2002     Allergies   Patient has no known allergies.   Review of Systems Review of Systems  Constitutional: Negative for chills, diaphoresis and fever.  HENT: Negative for congestion.   Respiratory: Positive for cough and shortness of breath.   Cardiovascular: Positive for chest pain and leg swelling (bilateral intermittent unchanged).  Gastrointestinal: Negative for abdominal pain, nausea and vomiting.  Neurological: Negative for dizziness,  syncope, weakness, light-headedness, numbness and headaches.  All other systems reviewed and are negative.  Physical Exam Updated Vital Signs BP (!) 147/94   Pulse 66   Temp 98 F (36.7 C) (Oral)   Resp 12   Ht _0  (1.778 m)   Wt 106.1 kg (234 lb)   SpO2 97%   BMI 33.58 kg/m   Physical Exam  Constitutional: He appears well-developed and well-nourished.  Non-toxic appearance. No distress.  HENT:  Head: Normocephalic and atraumatic.  Eyes: Conjunctivae are normal. Right eye exhibits no discharge. Left eye exhibits no discharge.  Cardiovascular: Normal rate, regular rhythm, intact distal pulses and normal pulses.  No murmur heard. Pulses:      Radial pulses are 2+ on the right side, and 2+ on the left side.       Dorsalis pedis pulses are 2+ on the right side, and 2+ on the left side.  Pulmonary/Chest: Breath sounds normal. No respiratory distress. He has no wheezes. He has no rales.  Abdominal: Soft. He exhibits no distension. There is no tenderness.  Musculoskeletal:       Right lower leg: He exhibits edema (trace). He exhibits no tenderness.       Left lower leg: He exhibits edema (trace). He exhibits no tenderness.  Neurological: He is alert.  Clear speech.   Skin: Skin is warm and dry. No rash noted.  Psychiatric: He has a normal mood and affect. His behavior is normal.  Nursing note and vitals reviewed.   ED Treatments / Results  Labs Results for orders placed or  performed during the hospital encounter of 33/29/51  Basic metabolic panel  Result Value Ref Range   Sodium 137 135 - 145 mmol/L   Potassium 4.1 3.5 - 5.1 mmol/L   Chloride 105 101 - 111 mmol/L   CO2 22 22 - 32 mmol/L   Glucose, Bld 108 (H) 65 - 99 mg/dL   BUN 7 6 - 20 mg/dL   Creatinine, Ser 0.87 0.61 - 1.24 mg/dL   Calcium 8.6 (L) 8.9 - 10.3 mg/dL   GFR calc non Af Amer >60 >60 mL/min   GFR calc Af Amer >60 >60 mL/min   Anion gap 10 5 - 15  D-dimer, quantitative  Result Value Ref Range   D-Dimer, Quant <0.27 0.00 - 0.50 ug/mL-FEU  CBC with Differential  Result Value Ref Range   WBC 7.8 4.0 - 10.5 K/uL   RBC 5.24 4.22 - 5.81 MIL/uL   Hemoglobin 16.0 13.0 - 17.0 g/dL   HCT 43.6 39.0 - 52.0 %   MCV 83.2 78.0 - 100.0 fL   MCH 30.5 26.0 - 34.0 pg   MCHC 36.7 (H) 30.0 - 36.0 g/dL   RDW 13.0 11.5 - 15.5 %   Platelets 250 150 - 400 K/uL   Neutrophils Relative % 50 %   Neutro Abs 3.9 1.7 - 7.7 K/uL   Lymphocytes Relative 35 %   Lymphs Abs 2.8 0.7 - 4.0 K/uL   Monocytes Relative 10 %   Monocytes Absolute 0.8 0.1 - 1.0 K/uL   Eosinophils Relative 4 %   Eosinophils Absolute 0.3 0.0 - 0.7 K/uL   Basophils Relative 1 %   Basophils Absolute 0.0 0.0 - 0.1 K/uL  Troponin I  (0, 3)  Result Value Ref Range   Troponin I <0.03 <0.03 ng/mL  Troponin I  (0, 3)  Result Value Ref Range   Troponin I <0.03 <0.03 ng/mL   EKG EKG Interpretation  Date/Time:  Sunday June 04 2018 11:39:50 EDT Ventricular Rate:  65 PR Interval:    QRS Duration: 93 QT Interval:  378 QTC Calculation: 393 R Axis:   62 Text Interpretation:  Sinus arrhythmia Abnormal R-wave progression, early transition Confirmed by Tanna Furry [phone]) on 06/04/2018 4:12:07 PM   Radiology Dg Chest 2 View  Result Date: 06/04/2018 CLINICAL DATA:  Chest pain. EXAM: CHEST - 2 VIEW COMPARISON:  03/11/2017 FINDINGS: The heart size and mediastinal contours are within normal limits. Both lungs are clear. The visualized skeletal  structures are unremarkable. IMPRESSION: No active cardiopulmonary disease. Electronically Signed   By: Kerby Moors M.D.   On: 06/04/2018 12:28    Procedures Procedures (including critical care time)  Medications Ordered in ED Medications - No data to display  Prior studies reviewed:   [place], [place] #: [id number]  [place], [place] [id number]: 05/30/2018 02: 87 PM   Adult Male Age: 84 yrs  ECHOCARDIOGRAM REPORTOutpatient   KC^^KCWI   STUDY:CHEST WALL TAPE:0000: 00: 0: 00: 00 MD1: FLEMING, HERBON E  ECHO:YesDOPPLER:Yes FILE:0000-000-000BP: 149/95 mmHg   COLOR:Yes CONTRAST:No MACHINE:[place]  RV BIOPSY:No3D:NoSOUND QLTY:ModerateHeight: 70 in  MEDIUM:NoneWeight: 237 lb   BSA: 2.2 m2  _________________________________________________________________________________________  HISTORY: DOE   REASON: Assess, LV function   INDICATION: DOE (dyspnea on exertion) [R06.09 (ICD-10-CM)]  _________________________________________________________________________________________  ECHOCARDIOGRAPHIC MEASUREMENTS  2D DIMENSIONS  AORTAValues Normal Range MAIN PA ValuesNormal Range  Annulus: nm*[2.3-2.9] PA Main: nm* [1.5-2.1]  Aorta Sin: nm*[3.1-3.7]RIGHT VENTRICLE  ST Junction:  nm*[2.6-3.2] RV Base: 4.9 cm[< 4.2]  Asc.Aorta: nm*[2.6-3.4]RV Mid: 4.0 cm[< 3.5]  LEFT VENTRICLERV Length: 7.7 cm[<8.6]  LVIDd: 4.1 cm [4.2-5.9]INFERIOR VENA CAVA  LVIDs: 2.1 cmMax. IVC: nm* [<=2.1]   FS: 48.2 % [>25]Min. IVC: nm*  SWT: 1.1 cm [0.6-1.0]------------------  PWT: 1.1 cm [0.6-1.0]nm* - not measured  LEFT ATRIUM  LA Diam: 4.0 cm [3.0-4.0]  LA A4C Area: nm*[<20]  LA Volume: HW*[29-93]  _________________________________________________________________________________________  ECHOCARDIOGRAPHIC DESCRIPTIONS  AORTIC ROOT   Size: Normal   Dissection: INDETERM FOR DISSECTION  AORTIC VALVE   Leaflets: Tricuspid Morphology: MILDLY THICKENED   Mobility: Fully mobile  LEFT VENTRICLE   Size: NormalAnterior: Normal  Contraction: Normal Lateral: Normal   Closest EF: >55% (Estimated)Septal: Normal  LV Masses: No Masses Apical: Normal  LVH: NoneInferior: Normal  Posterior: Normal   Dias.FxClass: (Grade 1) relaxation abnormal, E/A reversal  MITRAL VALVE   Leaflets: NormalMobility: Fully mobile   Morphology: Normal  LEFT ATRIUM   Size: Normal LA Masses: No masses  IA Septum: Normal IAS  MAIN PA   Size: Normal  PULMONIC  VALVE   Morphology: NormalMobility: Fully mobile  RIGHT VENTRICLE  RV Masses: No Masses Size: Normal  Free Wall: Normal Contraction: Normal  TRICUSPID VALVE   Leaflets: NormalMobility: Fully mobile   Morphology: Normal  RIGHT ATRIUM   Size: NormalRA Other: None  RA Mass: No masses  PERICARDIUM  Fluid: No effusion  INFERIOR VENACAVA   Size: Normal Normal respiratory collapse  _________________________________________________________________________________________    DOPPLER ECHO and OTHER SPECIAL PROCEDURES   Aortic: TRIVIAL AR No AS   Mitral: TRIVIAL MR No MS   MV Inflow E Vel = 84.4 cm/sec MV Annulus E'Vel = 7.0 cm/sec   E/E'Ratio = 12.1  Tricuspid: TRIVIAL TR No TS  Pulmonary: TRIVIAL PR No PS  _________________________________________________________________________________________  INTERPRETATION  NORMAL LEFT VENTRICULAR SYSTOLIC FUNCTION  NORMAL RIGHT VENTRICULAR SYSTOLIC FUNCTION  MILD VALVULAR  REGURGITATION (See above)  NO VALVULAR STENOSIS  Normal pulmonary pressures  _________________________________________________________________________________________  Electronically signed Janalyn Shy, MD on 05/30/2018 05: 36 PM   Performed By: Scherrie November, RCS   Ordering Physician: Wallene Huh  _________________________________________________________________________________________    Initial Impression / Assessment and Plan / ED Course  I have reviewed the triage vital signs and the nursing notes.  Pertinent labs &  imaging results that were available during my care of the patient were reviewed by me and considered in my medical decision making (see chart for details).   Patient presents with complaint of chest pain and dyspnea on exertion, this is been occurring for 3 weeks, somewhat worse over the past 3 days.  Patient nontoxic-appearing, in no apparent distress, vitals notable for intermittent elevated blood pressure and intermittent bradycardia, doubt HTN emergency, patient aware of need for recheck.  Fairly benign physical exam.  DDX includes ACS, pulmonary embolism, dissection, GERD, pleurisy, costochondritis.  Patient's work-up in the emergency department has been fairly benign.  Labs grossly unremarkable.  No anemia.  No leukocytosis.  No significant electrolyte abnormalities, mild hypocalcemia at 8.6.  Troponin x2.  D-dimer within normal limits.  EKG without obvious ischemia.  Chest x-ray negative for infiltrate, effusion, vascular congestion, or pneumothorax.   Patient has low heart score, no obvious ischemia on EKG, troponin negative x2, doubt ACS. Patient is without unilateral leg pain/swelling, hemoptysis, recent surgery/trauma, recent long travel, hormone use, personal hx of cancer, or hx of DVT/PE, d-dimer WNL, doubt pulmonary embolism.  Patient's pain is not a tearing sensation, he has 2+ symmetric pulses, no widening of the mediastinum on imaging, doubt dissection.  Patient with recent echocardiogram which was reviewed, normal systolic function.  Normal pulmonary pressures.  Unclear definitive etiology to patient's symptoms, he has seen pulmonology and is being followed by them.  Feel the patient is hemodynamically stable and safe for discharge.  Will have patient follow-up with his primary care provider, pulmonology, as well as cardiology. I discussed results, treatment plan, need for follow-up, and return precautions with the patient and wife at bedside. Provided opportunity for questions, patient and  wife confirmed understanding and are in agreement with plan.   Findings and plan of care discussed with supervising physician Dr. Jeneen Rinks who is in agreement with plan.   Final Clinical Impressions(s) / ED Diagnoses   Final diagnoses:  Chest pain, unspecified type    ED Discharge Orders    None       Amaryllis Dyke, PA-C 06/04/18 1948    Tanna Furry, MD 06/10/18 1606

## 2018-06-04 NOTE — ED Triage Notes (Signed)
Chest pain for several days. Had an echo done Wednesday and was told all was normal.

## 2018-06-04 NOTE — Discharge Instructions (Addendum)
You were seen in the emergency department today for chest pain, your work-up in the emergency department was reassuring.  Your chest x-ray was normal.  Your EKG and the enzyme that we used to check your heart did not show findings of an acute heart attack.  The lab we used to check for a blood clot in your lungs did not show evidence of this.  Your lab work was all reassuring.  Unfortunately we are unsure of the exact cause of the pain that you are having.  Would like you to follow-up with your pulmonologist (lung specialist) as planned.  Would also like you to follow-up with your primary care provider and possibly a cardiologist within the next 3 to 5 days.  We have given you information for 2 different cardiologist in the area.  Return to the ER anytime for new or worsening symptoms including but not limited to worsening pain, worsened trouble breathing, passing out, fever, or any other concerns.

## 2018-06-12 ENCOUNTER — Encounter: Payer: Self-pay | Admitting: Allergy and Immunology

## 2018-06-12 ENCOUNTER — Ambulatory Visit: Payer: PRIVATE HEALTH INSURANCE | Admitting: Allergy and Immunology

## 2018-06-12 VITALS — BP 148/90 | HR 81 | Temp 98.4°F | Resp 16 | Ht 69.5 in | Wt 236.4 lb

## 2018-06-12 DIAGNOSIS — J3089 Other allergic rhinitis: Secondary | ICD-10-CM | POA: Diagnosis not present

## 2018-06-12 DIAGNOSIS — R053 Chronic cough: Secondary | ICD-10-CM

## 2018-06-12 DIAGNOSIS — J45991 Cough variant asthma: Secondary | ICD-10-CM

## 2018-06-12 DIAGNOSIS — R05 Cough: Secondary | ICD-10-CM

## 2018-06-12 DIAGNOSIS — K219 Gastro-esophageal reflux disease without esophagitis: Secondary | ICD-10-CM

## 2018-06-12 MED ORDER — AZELASTINE HCL 0.15 % NA SOLN: 2.0000 | Freq: Two times a day (BID) | NASAL | 5 refills | Status: DC

## 2018-06-12 MED ORDER — CARBINOXAMINE MALEATE 4 MG PO TABS: 1.0000 | ORAL_TABLET | Freq: Four times a day (QID) | ORAL | 5 refills | Status: DC | PRN

## 2018-06-12 MED ORDER — OMEPRAZOLE 20 MG PO CPDR: 20.0000 mg | DELAYED_RELEASE_CAPSULE | Freq: Two times a day (BID) | ORAL | 5 refills | Status: DC

## 2018-06-12 MED ORDER — TIOTROPIUM BROMIDE MONOHYDRATE 1.25 MCG/ACT IN AERS: 2.0000 | INHALATION_SPRAY | Freq: Every day | RESPIRATORY_TRACT | 3 refills | Status: DC

## 2018-06-12 NOTE — Progress Notes (Signed)
New Patient Note  RE: Jordan Buchanan MRN: [id number] DOB: [birth year] Date of Office Visit: 06/12/2018  Referring provider: Tanda Rockers, MD Primary care provider: Ria Bush, MD  Chief Complaint: Cough; Breathing Problem; and Wheezing   History of present illness: Jordan Buchanan is a 47 y.o. male seen today in consultation requested by Christinia Gully, MD.  Jordan Buchanan complains of a persistent cough, as well as dyspnea, and chest tightness.  He has noted wheezing on rare occasion, typically occurring at nighttime.  The cough is nonproductive without diurnal variation.  The cough seems to be triggered by laughing, prolonged talking, and any physical exertion causing him to "become winded". He also notes that the cough seems to get worse after large meals.  He has been followed by Dr. Melvyn Novas since June 2015 with a diagnosis of cough variant asthma.  He has been using Dulera or Symbicort without adequate symptom relief.  He reports that approximately 1 month ago he was asked by Dr. Melvyn Novas to discontinue the Symbicort as this occasion may be contributing to, rather than alleviating, his symptoms.  Since discontinuing Symbicort he has not perceived a change in symptoms.  He reports that he has been on a proton pump inhibitor over the past decade.  This medication was started in an attempt to control laryngopharyngeal reflux causing hoarseness.  He started taking the proton pump inhibitor twice daily per Dr. Gustavus Bryant instruction in 2015 but then went back to once-daily dosing after his cough resolved at that time.  He underwent septoplasty by Dr. Ernesto Rutherford in June 2018 without perceived benefit.  He experiences thick post nasal drainage, rhinorrhea, sneezing fits, and occasional ocular pruritus.  In an attempt to control the symptoms he takes cetirizine daily and adds loratadine and fluticasone nasal spray in the spring and fall.  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  He was seen by a cardiologist last week but results are pending. He is scheduled for polysomnography this Friday.  Assessment and plan: Cough, persistent The most common causes of chronic cough include the following: upper airway cough syndrome which is caused by variety of rhinosinus conditions; asthma; and gastroesophageal reflux disease. The history and physical examination suggest that his cough is multifactorial with contribution from postnasal drainage, acid reflux, and possible bronchial hyperresponsiveness. We will address these issues at this time.   A prescription has been provided for a flutter valve to be used as needed to break the coughing cycle.  Treatment plan as outlined below.    We will regroup in 8 weeks to assess treatment response and adjust therapy accordingly.  Cough variant asthma  Jordan Buchanan noted symptom reduction after Xopenex/Atrovent.  A prescription has been provided for Spiriva Respimat 1.25 g, 2 inhalations daily.  Continue albuterol HFA, 1 to 2 inhalations every 6 hours if needed and 15 minutes prior to vigorous activity/exercise.  Subjective and objective measures of pulmonary function will be followed and the treatment plan will be adjusted accordingly.  GERD (gastroesophageal reflux disease)  Appropriate reflux lifestyle modifications have been discussed and provided in written form.  Step up dosing of Prilosec to twice daily, 30 minutes prior to breakfast and 30 minutes prior to dinner.  Perennial allergic rhinitis Epicutaneous and intradermal environmental skin tests were positive today only to perennial molds.  Aeroallergen avoidance measures have been discussed and provided in written form.  A prescription has been provided  for carbinoxamine 4 mg every 6-8 hours as needed.  A prescription has been provided for azelastine nasal spray, 1-2 sprays per nostril 2 times daily as needed. Proper nasal spray  technique has been discussed and demonstrated.   If needed, may combine fluticasone nasal spray with azelastine nasal spray.  Nasal saline lavage (NeilMed) has been recommended as needed and prior to medicated nasal sprays along with instructions for proper administration.   Meds ordered this encounter  Medications  . Tiotropium Bromide Monohydrate (SPIRIVA RESPIMAT) 1.25 MCG/ACT AERS    Sig: Inhale 2 puffs into the lungs daily.    Dispense:  4 g    Refill:  3  . Carbinoxamine Maleate 4 MG TABS    Sig: Take 1 tablet (4 mg total) by mouth every 6 (six) hours as needed.    Dispense:  28 each    Refill:  5  . Azelastine HCl 0.15 % SOLN    Sig: Place 2 sprays into both nostrils 2 (two) times daily.    Dispense:  30 mL    Refill:  5  . omeprazole (PRILOSEC) 20 MG capsule    Sig: Take 1 capsule (20 mg total) by mouth 2 (two) times daily before a meal.    Dispense:  60 capsule    Refill:  5    Diagnostics: Spirometry: FVC was 4.55 L and FEV1 was 3.91 L (110% predicted) with an FEV1 ratio 106%.  There was 160 mL (4%) postbronchodilator improvement. Epicutaneous testing: Negative despite a positive histamine control. Intradermal testing: Positive to perennial mold mix 2 and perennial mold mix 4.    Physical examination: Blood pressure (!) 148/90, pulse 81, temperature 98.4 F (36.9 C), temperature source Oral, resp. rate 16, height 5' 9.5" (1.765 m), weight 236 lb 6.4 oz (107.2 kg), SpO2 95 %.  General: Alert, interactive, in no acute distress. HEENT: TMs pearly gray, turbinates moderately edematous without discharge, post-pharynx moderately erythematous. Neck: Supple without lymphadenopathy. Lungs: Clear to auscultation without wheezing, rhonchi or rales. CV: Normal S1, S2 without murmurs. Abdomen: Nondistended, nontender. Skin: Warm and dry, without lesions or rashes. Extremities:  No clubbing, cyanosis or edema. Neuro:   Grossly intact.  Review of systems:  Review of systems  negative except as noted in HPI / PMHx or noted below: Review of Systems  Constitutional: Negative.   HENT: Negative.   Eyes: Negative.   Respiratory: Negative.   Cardiovascular: Negative.   Gastrointestinal: Negative.   Genitourinary: Negative.   Musculoskeletal: Negative.   Skin: Negative.   Neurological: Negative.   Endo/Heme/Allergies: Negative.   Psychiatric/Behavioral: Negative.     Past medical history:  Past Medical History:  Diagnosis Date  . Arrhythmia    PALPITATIONS AND PVC - improved with cutting down on caffeine  . Cough variant asthma 05/28/2014   05/27/2014 p extensive coaching HFA effectiveness =    75% > try dulera 100 2bid  - PFT's 07/03/2014 FEV1 4.15 (98%) ratio 89 and no chang p B2 and nl fef25-75  And nl dlco    . Diverticulosis 07/2015   by CT scan  . GERD (gastroesophageal reflux disease)   . Hepatic steatosis   . Hypertension    h/o LVH, resolved  . Migraines   . Seasonal allergies   . 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 Estanislado Pandy)  . Tremor   . Urticaria     Past surgical history:  Past Surgical History:  Procedure Laterality Date  .  NASAL RECONSTRUCTION WITH SEPTAL REPAIR  2017   [facility]  . OTHER SURGICAL HISTORY  1992   facial surgery  . ROTATOR CUFF REPAIR  2009   left  . SINOSCOPY    . UMBILICAL HERNIA REPAIR  05/10/2012   Procedure: HERNIA REPAIR UMBILICAL ADULT;  Surgeon: Harl Bowie, MD;  Location: [facility];  Service: General;  Laterality: N/A;    Family history: Family History  Problem Relation Age of Onset  . Asthma Father   . Arthritis Father        father, brother, Pgrandfather  . Chronic bronchitis Father   . Allergies Father   . Hypertension Brother        obese  . CAD Paternal Grandfather        MI x2  . Multiple sclerosis Brother   . Stroke Neg Hx   . Cancer Neg Hx     Social history: Social History   Socioeconomic History  . Marital status:  Married    Spouse name: Not on file  . Number of children: 2  . Years of education: Not on file  . Highest education level: Not on file  Occupational History  . Occupation: golf course maintenance    Employer: [employer]  Social Needs  . Financial resource strain: Not on file  . Food insecurity:    Worry: Not on file    Inability: Not on file  . Transportation needs:    Medical: Not on file    Non-medical: Not on file  Tobacco Use  . Smoking status: Former Smoker    Packs/day: 1.00    Years: 13.00    Pack years: 13.00    Types: Cigarettes    Last attempt to quit: 12/21/1999    Years since quitting: 18.4  . Smokeless tobacco: Former Systems developer    Types: Snuff    Quit date: 12/20/2013  Substance and Sexual Activity  . Alcohol use: No    Alcohol/week: 0.0 oz    Comment: quit 2002  . Drug use: Not Currently    Types: Marijuana    Comment: quit 2002  . Sexual activity: Not on file  Lifestyle  . Physical activity:    Days per week: Not on file    Minutes per session: Not on file  . Stress: Not on file  Relationships  . Social connections:    Talks on phone: Not on file    Gets together: Not on file    Attends religious service: Not on file    Active member of club or organization: Not on file    Attends meetings of clubs or organizations: Not on file    Relationship status: Not on file  . Intimate partner violence:    Fear of current or ex partner: Not on file    Emotionally abused: Not on file    Physically abused: Not on file    Forced sexual activity: Not on file  Other Topics Concern  . 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  There are 2 dogs in the home  which have access to his bedroom.  There is mold/water  damage in the home.  He is a former cigarette smoker having quit 20 years ago with a 20-pack-year history.  Allergies as of 06/12/2018   No Known Allergies     Medication List        Accurate as of 06/12/18  1:14 PM. Always use your most recent med list.          albuterol 108 (90 Base) MCG/ACT inhaler Commonly known as:  VENTOLIN HFA Inhale 2 puffs into the lungs every 4 (four) hours as needed for wheezing or shortness of breath.   amLODipine 10 MG tablet Commonly known as:  NORVASC TAKE 1 TABLET (10 MG TOTAL) BY MOUTH DAILY.   Azelastine HCl 0.15 % Soln Place 2 sprays into both nostrils 2 (two) times daily.   budesonide-formoterol 80-4.5 MCG/ACT inhaler Commonly known as:  SYMBICORT Take 2 puffs first thing in am and then another 2 puffs about 12 hours later.   Carbinoxamine Maleate 4 MG Tabs Take 1 tablet (4 mg total) by mouth every 6 (six) hours as needed.   cetirizine 10 MG tablet Commonly known as:  ZYRTEC Take 10 mg by mouth at bedtime.   esomeprazole 40 MG capsule Commonly known as:  NEXIUM TAKE 1 CAPSULE BY MOUTH EVERY DAY BY MOUTH   fluticasone 50 MCG/ACT nasal spray Commonly known as:  FLONASE SPRAY 2 SPRAYS INTO EACH NOSTRIL EVERY DAY   metoprolol succinate 50 MG 24 hr tablet Commonly known as:  TOPROL-XL TAKE 1 TABLET (50 MG TOTAL) BY MOUTH DAILY.   naproxen sodium 550 MG tablet Commonly known as:  ANAPROX Take one tablet twice daily with food for 5 days then as needed   omeprazole 20 MG capsule Commonly known as:  PRILOSEC Take 1 capsule (20 mg total) by mouth 2 (two) times daily before a meal.   predniSONE 10 MG tablet Commonly known as:  DELTASONE Take  4 each am x 2 days,   2 each am x 2 days,  1 each am x 2 days and stop   Tiotropium Bromide Monohydrate 1.25 MCG/ACT Aers Commonly known as:  SPIRIVA RESPIMAT Inhale 2 puffs into the lungs daily.       Known medication allergies: No  Known Allergies  I appreciate the opportunity to take part in Thanh's care. Please do not hesitate to contact me with questions.  Sincerely,   R. Edgar Frisk, MD

## 2018-06-12 NOTE — Assessment & Plan Note (Signed)
Epicutaneous and intradermal environmental skin tests were positive today only to perennial molds.  Aeroallergen avoidance measures have been discussed and provided in written form.  A prescription has been provided for carbinoxamine 4 mg every 6-8 hours as needed.  A prescription has been provided for azelastine nasal spray, 1-2 sprays per nostril 2 times daily as needed. Proper nasal spray technique has been discussed and demonstrated.   If needed, may combine fluticasone nasal spray with azelastine nasal spray.  Nasal saline lavage (NeilMed) has been recommended as needed and prior to medicated nasal sprays along with instructions for proper administration.

## 2018-06-12 NOTE — Assessment & Plan Note (Addendum)
Jordan Buchanan noted symptom reduction after Xopenex/Atrovent.  A prescription has been provided for Spiriva Respimat 1.25 g, 2 inhalations daily.  Continue albuterol HFA, 1 to 2 inhalations every 6 hours if needed and 15 minutes prior to vigorous activity/exercise.  Subjective and objective measures of pulmonary function will be followed and the treatment plan will be adjusted accordingly.

## 2018-06-12 NOTE — Patient Instructions (Addendum)
Cough, persistent The most common causes of chronic cough include the following: upper airway cough syndrome which is caused by variety of rhinosinus conditions; asthma; and gastroesophageal reflux disease. The history and physical examination suggest that his cough is multifactorial with contribution from postnasal drainage, acid reflux, and possible bronchial hyperresponsiveness. We will address these issues at this time.   A prescription has been provided for a flutter valve to be used as needed to break the coughing cycle.  Treatment plan as outlined below.    We will regroup in 8 weeks to assess treatment response and adjust therapy accordingly.  Cough variant asthma  Jordan Buchanan noted symptom reduction after Xopenex/Atrovent.  A prescription has been provided for Spiriva Respimat 1.25 g, 2 inhalations daily.  Continue albuterol HFA, 1 to 2 inhalations every 6 hours if needed and 15 minutes prior to vigorous activity/exercise.  Subjective and objective measures of pulmonary function will be followed and the treatment plan will be adjusted accordingly.  GERD (gastroesophageal reflux disease)  Appropriate reflux lifestyle modifications have been discussed and provided in written form.  Step up dosing of Prilosec to twice daily, 30 minutes prior to breakfast and 30 minutes prior to dinner.  Perennial allergic rhinitis Epicutaneous and intradermal environmental skin tests were positive today only to perennial molds.  Aeroallergen avoidance measures have been discussed and provided in written form.  A prescription has been provided for carbinoxamine 4 mg every 6-8 hours as needed.  A prescription has been provided for azelastine nasal spray, 1-2 sprays per nostril 2 times daily as needed. Proper nasal spray technique has been discussed and demonstrated.   If needed, may combine fluticasone nasal spray with azelastine nasal spray.  Nasal saline lavage (NeilMed) has been recommended as  needed and prior to medicated nasal sprays along with instructions for proper administration.   Return in about 2 months (around 08/12/2018), or if symptoms worsen or fail to improve.  Control of Mold Allergen  Mold and fungi can grow on a variety of surfaces provided certain temperature and moisture conditions exist.  Outdoor molds grow on plants, decaying vegetation and soil.  The major outdoor mold, Alternaria and Cladosporium, are found in very high numbers during hot and dry conditions.  Generally, a late Summer - Fall peak is seen for common outdoor fungal spores.  Rain will temporarily lower outdoor mold spore count, but counts rise rapidly when the rainy period ends.  The most important indoor molds are Aspergillus and Penicillium.  Dark, humid and poorly ventilated basements are ideal sites for mold growth.  The next most common sites of mold growth are the bathroom and the kitchen.  Outdoor [employer] 1. Use air conditioning and keep windows closed 2. Avoid exposure to decaying vegetation. 3. Avoid leaf raking. 4. Avoid grain handling. 5. Consider wearing a face mask if working in moldy areas.  Indoor Mold Control 2. Maintain humidity below 50%. 3. Clean washable surfaces with 5% bleach solution. 4. Remove sources e.g. Contaminated carpets.  Lifestyle Changes for Controlling GERD  When you have GERD, stomach acid feels as if it's backing up toward your mouth. Whether or not you take medication to control your GERD, your symptoms can often be improved with lifestyle changes.   Raise Your Head  Reflux is more likely to strike when you're lying down flat, because stomach fluid can  flow backward more easily. Raising the head of your bed 4-6 inches can help. To do this:  Slide blocks or books under the  legs at the head of your bed. Or, place a wedge under  the mattress. Many foam stores can make a suitable wedge for you. The wedge  should run from your waist to the top of  your head.  Don't just prop your head on several pillows. This increases pressure on your  stomach. It can make GERD worse.  Watch Your Eating Habits Certain foods may increase the acid in your stomach or relax the lower esophageal sphincter, making GERD more likely. It's best to avoid the following:  Coffee, tea, and carbonated drinks (with and without caffeine)  Fatty, fried, or spicy food  Mint, chocolate, onions, and tomatoes  Any other foods that seem to irritate your stomach or cause you pain  Relieve the Pressure  Eat smaller meals, even if you have to eat more often.  Don't lie down right after you eat. Wait a few hours for your stomach to empty.  Avoid tight belts and tight-fitting clothes.  Lose excess weight.  Tobacco and Alcohol  Avoid smoking tobacco and drinking alcohol. They can make GERD symptoms worse.

## 2018-06-12 NOTE — Assessment & Plan Note (Addendum)
The most common causes of chronic cough include the following: upper airway cough syndrome which is caused by variety of rhinosinus conditions; asthma; and gastroesophageal reflux disease. The history and physical examination suggest that his cough is multifactorial with contribution from postnasal drainage, acid reflux, and possible bronchial hyperresponsiveness. We will address these issues at this time.   A prescription has been provided for a flutter valve to be used as needed to break the coughing cycle.  Treatment plan as outlined below.    We will regroup in 8 weeks to assess treatment response and adjust therapy accordingly.

## 2018-06-12 NOTE — Assessment & Plan Note (Signed)
   Appropriate reflux lifestyle modifications have been discussed and provided in written form.  Step up dosing of Prilosec to twice daily, 30 minutes prior to breakfast and 30 minutes prior to dinner.

## 2018-06-14 ENCOUNTER — Telehealth: Payer: Self-pay | Admitting: Allergy and Immunology

## 2018-06-14 LAB — LIPID PANEL
CHOLESTEROL: 158 (ref 0–200)
HDL: 42 (ref 35–70)
LDL Cholesterol: 96
Triglycerides: 100 (ref 40–160)

## 2018-06-14 LAB — HEPATIC FUNCTION PANEL
ALK PHOS: 65 (ref 25–125)
ALT: 23 (ref 10–40)
AST: 20 (ref 14–40)
Bilirubin, Total: 1.3

## 2018-06-14 LAB — TSH: TSH: 0.42 (ref 0.41–5.90)

## 2018-06-14 NOTE — Telephone Encounter (Signed)
Pt wife called and said that the ins said that they need a prior auth. For the prilosec 2 times a day. [facility] [place]. [phone] home phone or [phone] cell number.

## 2018-06-15 NOTE — Telephone Encounter (Signed)
Spoke with pt wanted to find out more information. Pt will call pharmacy about the prior pa we have done, (had to do a pa last time because its bid) and if it has been over a year he will have them send [place] a pa request.

## 2018-06-16 NOTE — Telephone Encounter (Signed)
Patient's insurance will not cover the omeprazole 20 mg bid because he can purchase OTC. They will cover the omeprazole delayed release 40 mg po qd. Please advise and thank you.

## 2018-06-17 NOTE — Telephone Encounter (Signed)
omeprazole delayed release 40 mg po qd is fine.  Thank you.

## 2018-06-19 MED ORDER — OMEPRAZOLE 40 MG PO CPDR: 40.0000 mg | DELAYED_RELEASE_CAPSULE | Freq: Every day | ORAL | 5 refills | Status: DC

## 2018-06-19 NOTE — Addendum Note (Signed)
Addended by: Lucrezia Starch I on: 06/19/2018 07:23 AM   Modules accepted: Orders

## 2018-06-19 NOTE — Telephone Encounter (Signed)
Left detailed message on voicemail.  

## 2018-06-19 NOTE — Telephone Encounter (Signed)
Prescription has been sent and omeprazole 20 mg bid discontinued in Epic. Patient is not aware at this time.

## 2018-06-22 ENCOUNTER — Other Ambulatory Visit: Payer: Self-pay | Admitting: Family Medicine

## 2018-07-04 ENCOUNTER — Encounter: Payer: Self-pay | Admitting: Family Medicine

## 2018-07-04 ENCOUNTER — Ambulatory Visit: Payer: No Typology Code available for payment source | Admitting: Family Medicine

## 2018-07-04 VITALS — BP 118/70 | HR 70 | Temp 97.8°F | Ht 70.0 in | Wt 239.5 lb

## 2018-07-04 DIAGNOSIS — I10 Essential (primary) hypertension: Secondary | ICD-10-CM

## 2018-07-04 DIAGNOSIS — R079 Chest pain, unspecified: Secondary | ICD-10-CM | POA: Insufficient documentation

## 2018-07-04 DIAGNOSIS — E669 Obesity, unspecified: Secondary | ICD-10-CM

## 2018-07-04 MED ORDER — HYDROXYZINE HCL 25 MG PO TABS: 12.5000 mg | ORAL_TABLET | Freq: Two times a day (BID) | ORAL | 1 refills | Status: DC | PRN

## 2018-07-04 MED ORDER — RANITIDINE HCL 150 MG PO TABS: 150.0000 mg | ORAL_TABLET | Freq: Every day | ORAL | Status: DC

## 2018-07-04 NOTE — Progress Notes (Signed)
BP 118/70 (BP Location: Left Arm, Patient Position: Sitting, Cuff Size: Large)   Pulse 70   Temp 97.8 F (36.6 C) (Oral)   Ht 5\' 10"  (1.778 m)   Wt 239 lb 8 oz (108.6 kg)   SpO2 96%   BMI 34.36 kg/m    CC: chest pain Subjective:    Patient ID: Jordan Buchanan, male    DOB: 1971/04/09, 47 y.o.   MRN: [id number]  HPI: Jordan Buchanan is a 47 y.o. male presenting on 07/04/2018 for Chest Pain (C/o chest pain for months. Located in mid chest. Seen by pulmo, cardio and allergist. Pt thinks at this point,may be anxiety. ) and Foot Pain (C/o left heel pain for about 1 mo. )   Ongoing chest pain for 2 months. Describes "bronchitis pain" pressure/tightness worse with deep breath. Not exertional. Unsure if relieved by rest. Some dyspnea, upper airway wheezing, cough. Cough has actually improved off respiratory medications. Wonders if this could be anxiety. 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 Notes excess worrying. Not overwhelming. GERD symptoms are improved on PPI. No abd pain. Pain not reproducible. No dysphagia. He was on xanax remotely - does not want habit forming medication.   H/o cough variant asthma, allergic rhinitis, laryngopharyngeal reflux causing hoarseness. Has had cardiology evaluation, GI, allergist and ENT evaluations. S/p nasal septal deviation repair. Doesn't remember name of cardiologist he saw last month. I don't have records of cardiac evaluation - have requested today.   Latest saw allergist - started on spiriva respimat, asteline nasal spray and flutter valve. spiriva worsened cough and chest pain - so he stopped this. Also stopped astelin nasal spray. rec continue albuterol inhaler. Omeprazole 20mg  was increased to BID dosing. He is actually taking omeprazole 40mg  daily. Allergy testing positive to molds. Planned f/u 07/2018.   Echocardiogram 05/2018 - normal heart function, minimal valvular regurg, normal pulm pressures  CT scan from 02/2018 reviewed  with patient.  Relevant past medical, surgical, family and social history reviewed and updated as indicated. Interim medical history since our last visit reviewed. Allergies and medications reviewed and updated. Outpatient Medications Prior to Visit  Medication Sig Dispense Refill  . albuterol (VENTOLIN HFA) 108 (90 Base) MCG/ACT inhaler Inhale 2 puffs into the lungs every 4 (four) hours as needed for wheezing or shortness of breath. 1 Inhaler 5  . amLODipine (NORVASC) 10 MG tablet TAKE 1 TABLET (10 MG TOTAL) BY MOUTH DAILY. 90 tablet 2  . metoprolol succinate (TOPROL-XL) 50 MG 24 hr tablet TAKE 1 TABLET BY MOUTH EVERY DAY 30 tablet 0  . omeprazole (PRILOSEC) 40 MG capsule Take 1 capsule (40 mg total) by mouth daily. 30 capsule 5  . Carbinoxamine Maleate 4 MG TABS Take 1 tablet (4 mg total) by mouth every 6 (six) hours as needed. (Patient not taking: Reported on 07/04/2018) 28 each 5  . cetirizine (ZYRTEC) 10 MG tablet Take 10 mg by mouth at bedtime.     . Azelastine HCl 0.15 % SOLN Place 2 sprays into both nostrils 2 (two) times daily. 30 mL 5  . budesonide-formoterol (SYMBICORT) 80-4.5 MCG/ACT inhaler Take 2 puffs first thing in am and then another 2 puffs about 12 hours later. (Patient not taking: Reported on 06/12/2018) 1 Inhaler 11  . esomeprazole (NEXIUM) 40 MG capsule TAKE 1 CAPSULE BY MOUTH EVERY DAY BY MOUTH 30 capsule 0  . fluticasone (FLONASE) 50 MCG/ACT nasal spray SPRAY 2 SPRAYS INTO EACH NOSTRIL EVERY DAY 16 g 0  .  naproxen sodium (ANAPROX) 550 MG tablet Take one tablet twice daily with food for 5 days then as needed 50 tablet 0  . predniSONE (DELTASONE) 10 MG tablet Take  4 each am x 2 days,   2 each am x 2 days,  1 each am x 2 days and stop (Patient not taking: Reported on 06/12/2018) 14 tablet 0  . Tiotropium Bromide Monohydrate (SPIRIVA RESPIMAT) 1.25 MCG/ACT AERS Inhale 2 puffs into the lungs daily. 4 g 3   No facility-administered medications prior to visit.      Per HPI  unless specifically indicated in ROS section below Review of Systems     Objective:    BP 118/70 (BP Location: Left Arm, Patient Position: Sitting, Cuff Size: Large)   Pulse 70   Temp 97.8 F (36.6 C) (Oral)   Ht 5\' 10"  (1.778 m)   Wt 239 lb 8 oz (108.6 kg)   SpO2 96%   BMI 34.36 kg/m   Wt Readings from Last 3 Encounters:  07/04/18 239 lb 8 oz (108.6 kg)  06/12/18 236 lb 6.4 oz (107.2 kg)  06/04/18 234 lb (106.1 kg)    BP Readings from Last 3 Encounters:  07/04/18 118/70  06/12/18 (!) 148/90  06/04/18 (!) 139/113   Physical Exam  Constitutional: He appears well-developed and well-nourished. No distress.  HENT:  Head: Normocephalic and atraumatic.  Mouth/Throat: Oropharynx is clear and moist. No oropharyngeal exudate.  Eyes: Pupils are equal, round, and reactive to light. EOM are normal.  Neck: Normal range of motion. Neck supple. No thyromegaly present.  Cardiovascular: Normal rate, regular rhythm, normal heart sounds and normal pulses.  No murmur heard. Pulmonary/Chest: Effort normal and breath sounds normal. No respiratory distress. He has no wheezes. He has no rales. He exhibits no tenderness (no reproducible tenderness).  Abdominal: Soft. Bowel sounds are normal. He exhibits no distension and no mass. There is tenderness (mild) in the epigastric area. There is no rebound and no guarding. No hernia.  Musculoskeletal: He exhibits no edema.  Lymphadenopathy:    He has no cervical adenopathy.  Skin: Skin is warm and dry. No rash noted.  Psychiatric: He has a normal mood and affect.  Nursing note and vitals reviewed.  Results for orders placed or performed during the hospital encounter of 81/44/81  Basic metabolic panel  Result Value Ref Range   Sodium 137 135 - 145 mmol/L   Potassium 4.1 3.5 - 5.1 mmol/L   Chloride 105 101 - 111 mmol/L   CO2 22 22 - 32 mmol/L   Glucose, Bld 108 (H) 65 - 99 mg/dL   BUN 7 6 - 20 mg/dL   Creatinine, Ser 0.87 0.61 - 1.24 mg/dL    Calcium 8.6 (L) 8.9 - 10.3 mg/dL   GFR calc non Af Amer >60 >60 mL/min   GFR calc Af Amer >60 >60 mL/min   Anion gap 10 5 - 15  D-dimer, quantitative  Result Value Ref Range   D-Dimer, Quant <0.27 0.00 - 0.50 ug/mL-FEU  CBC with Differential  Result Value Ref Range   WBC 7.8 4.0 - 10.5 K/uL   RBC 5.24 4.22 - 5.81 MIL/uL   Hemoglobin 16.0 13.0 - 17.0 g/dL   HCT 43.6 39.0 - 52.0 %   MCV 83.2 78.0 - 100.0 fL   MCH 30.5 26.0 - 34.0 pg   MCHC 36.7 (H) 30.0 - 36.0 g/dL   RDW 13.0 11.5 - 15.5 %   Platelets 250 150 - 400 K/uL  Neutrophils Relative % 50 %   Neutro Abs 3.9 1.7 - 7.7 K/uL   Lymphocytes Relative 35 %   Lymphs Abs 2.8 0.7 - 4.0 K/uL   Monocytes Relative 10 %   Monocytes Absolute 0.8 0.1 - 1.0 K/uL   Eosinophils Relative 4 %   Eosinophils Absolute 0.3 0.0 - 0.7 K/uL   Basophils Relative 1 %   Basophils Absolute 0.0 0.0 - 0.1 K/uL  Troponin I  (0, 3)  Result Value Ref Range   Troponin I <0.03 <0.03 ng/mL  Troponin I  (0, 3)  Result Value Ref Range   Troponin I <0.03 <0.03 ng/mL      Assessment & Plan:   Problem List Items Addressed This Visit    Obesity, [name], BMI 30.0-34.9 (see actual BMI)    Encouraged working towards weight loss to prevent recurrence of umbilical hernia and for overall health      Hypertension    Chronic, stable. Continue current reigmen.       Chest pain - Primary    Chest pain is atypical, does not sound cardiac.  Recent unrevealing evaluation (pulm, allergist, cardiologist) - will request cardiac records to review.  ?anxiety vs GERD related discomfort. Will Rx hydroxyzine PRN anxiety and zantac nightly for possible GERD contribution.  Update with effect of above treatment.           Meds ordered this encounter  Medications  . hydrOXYzine (ATARAX/VISTARIL) 25 MG tablet    Sig: Take 0.5-1 tablets (12.5-25 mg total) by mouth 2 (two) times daily as needed for anxiety.    Dispense:  30 tablet    Refill:  1  . ranitidine (ZANTAC)  150 MG tablet    Sig: Take 1 tablet (150 mg total) by mouth at bedtime.   No orders of the defined types were placed in this encounter.   Follow up plan: Return in about 6 months (around 01/04/2019) for annual exam, prior fasting for blood work.  Ria Bush, MD

## 2018-07-04 NOTE — Patient Instructions (Addendum)
Sign release up front for records of recent cardiology evaluation.  Possible GI related - continue omeprazole 40mg  in the morning, add zantac at bedtime (daily). Possible anxiety related - trial hydroxyzine 1/2-1 tablet as needed for anxiety/stress.  Let us know how you do with both.  Return for physical at your convenience, with fasting labs prior.

## 2018-07-04 NOTE — Assessment & Plan Note (Signed)
Encouraged working towards weight loss to prevent recurrence of umbilical hernia and for overall health

## 2018-07-04 NOTE — Assessment & Plan Note (Signed)
Chest pain is atypical, does not sound cardiac.  Recent unrevealing evaluation (pulm, allergist, cardiologist) - will request cardiac records to review.  ?anxiety vs GERD related discomfort. Will Rx hydroxyzine PRN anxiety and zantac nightly for possible GERD contribution.  Update with effect of above treatment.

## 2018-07-04 NOTE — Assessment & Plan Note (Signed)
Chronic, stable. Continue current reigmen.

## 2018-07-07 ENCOUNTER — Encounter: Payer: Self-pay | Admitting: Family Medicine

## 2018-07-13 ENCOUNTER — Ambulatory Visit: Payer: Self-pay

## 2018-07-13 NOTE — Telephone Encounter (Signed)
I spoke with pt; pt said heartburn has subsided since took Pepto and Prilosec and Hydroxyzine; pt slept for about an hour and feels better. If CP returns pt said he would go to ED. FYI to Dr Darnell Level and Dr Damita Dunnings.

## 2018-07-13 NOTE — Telephone Encounter (Signed)
rec'd call from pt. To report continued chest pain, shortness of breath and anxiety.  Stated he has been evaluated by Cardiology, Pulmonology, and an Allergy Specialist.  Stated his chest pain is worse than when he saw Dr. Danise Mina 9 days ago.  Today, c/o chest pain in the center, and slightly to left of center, of his chest .  Reported the pain is continuous.  Stated has had a recurrence of heart burn today, that he has not experienced for awhile.  Rated pain at 5/10, and constant. Reported it had been intermittent, but now is constant.  Pt. stated he had swelling in feet, ankles, and up to calves last night, but the swelling has gone down today.  Stated he has some shortness of breath; described an increase in chest pain with exhaling.  Stated he feels tired, has a headache today.  Reported wheezing this AM, when first waking up.  Denied any wheezing at this time. Stated has a nonproductive cough.  Reported has taken Hydroxyzine today as advised per Dr. Danise Mina, to help with anxiety, but it hasn't helped his pain today.  Also, stated has heartburn today, that he had not really experienced for awhile. Is taking Prilosec in the AM, and Zantac in the PM.  Reported took a dose of Pepto Bismol today for the heartburn without any relief.  Stated he has been dealing with many of these symptoms for 2 mos.  Stated he is tired of not feeling good.  Advised due to the constant chest pain and now onset of heartburn, unrelieved with current medication regime, he should go to the ER.  Pt. Stated that they had ruled out that the pain in chest is cardiac related.  Advised if pain persists, the recommendation is to go to the ER.  stated he will discuss with wife and make decision whether to go to ER.  Voiced concern that there is no plan to follow up in office.  Appt. to f/u with Dr. Danise Mina scheduled for 07/18/18.  Care advice given per protocol.  Pt. Verb. Understanding.             Reason for Disposition . Chest pain  lasts > 5 minutes (Exceptions: chest pain occurring > 3 days ago and now asymptomatic; same as previously diagnosed heartburn and has accompanying sour taste in mouth)  Answer Assessment - Initial Assessment Questions 1. LOCATION: "Where does it hurt?"       In the center and slightly to the left  2. RADIATION: "Does the pain go anywhere else?" (e.g., into neck, jaw, arms, back)     Had pain radiated through to left back 3. ONSET: "When did the chest pain begin?" (Minutes, hours or days)      Has had intermittent chest pain over 2 mos, but today the heartburn has started back up  4. PATTERN "Does the pain come and go, or has it been constant since it started?"  "Does it get worse with exertion?"      Constant; it had been intermittent a couple days ago 5. DURATION: "How long does it last" (e.g., seconds, minutes, hours)     continuous 6. SEVERITY: "How bad is the pain?"  (e.g., Scale 1-10; mild, moderate, or severe)    - MILD (1-3): doesn't interfere with normal activities     - MODERATE (4-7): interferes with normal activities or awakens from sleep    - SEVERE (8-10): excruciating pain, unable to do any normal activities  5/10 7. 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 Cholesterol, denied smoking hx. ;  family hx of heart disease- grandfather had MI at age 35   8. PULMONARY RISK FACTORS: "Do you have any history of lung disease?"  (e.g., blood clots in lung, asthma, emphysema, birth control pills)     denied COPD; uses inhalers for seasonal asthma 9. CAUSE: "What do you think is causing the chest pain?"     Feeling anxious- Hydroxyzine hasn't helped this  10. OTHER SYMPTOMS: "Do you have any other symptoms?" (e.g., dizziness,  nausea, vomiting, sweating, fever, difficulty breathing, cough)       C/o headache base of skull and lower part of neck; denied nausea/ vomiting; denied sweating; stated the chest pain sharpens upon exhaling; had swelling in feet and ankle up to calf yesterday, but has greatly improved today  Protocols used: CHEST PAIN-A-AH

## 2018-07-14 MED ORDER — SUCRALFATE 1 G PO TABS: 1.0000 g | ORAL_TABLET | Freq: Three times a day (TID) | ORAL | 0 refills | Status: DC

## 2018-07-14 NOTE — Telephone Encounter (Signed)
Spoke with pt relaying message and instructions per Dr. Darnell Level.  Pt verbalizes understanding and expresses his thanks.

## 2018-07-14 NOTE — Telephone Encounter (Signed)
Noted. Thanks.

## 2018-07-14 NOTE — Addendum Note (Signed)
Addended by: Ria Bush on: 07/14/2018 10:44 AM   Modules accepted: Orders

## 2018-07-14 NOTE — Telephone Encounter (Addendum)
Sounds like zantac nightly wasn't as helpful as we would have liked.  With worsening GERD, I recommend he try carafate tablet with meals for 2 wks to coat and protect stomach. This would treat possible ulcer disease. Sent to pharmacy. Will see him next week.

## 2018-07-18 ENCOUNTER — Ambulatory Visit (INDEPENDENT_AMBULATORY_CARE_PROVIDER_SITE_OTHER): Payer: No Typology Code available for payment source | Admitting: Family Medicine

## 2018-07-18 ENCOUNTER — Encounter: Payer: Self-pay | Admitting: Family Medicine

## 2018-07-18 VITALS — BP 124/82 | HR 73 | Temp 97.8°F | Ht 70.0 in | Wt 242.8 lb

## 2018-07-18 DIAGNOSIS — K219 Gastro-esophageal reflux disease without esophagitis: Secondary | ICD-10-CM

## 2018-07-18 DIAGNOSIS — R079 Chest pain, unspecified: Secondary | ICD-10-CM

## 2018-07-18 LAB — H. PYLORI ANTIBODY, IGG: H Pylori IgG: NEGATIVE

## 2018-07-18 NOTE — Patient Instructions (Addendum)
Labs today to check H pylori.  Continue carafate for the next 2 weeks.   Helicobacter Pylori Infection Helicobacter pylori infection is an infection in the stomach that is caused by the Helicobacter pylori (H. pylori) bacteria. This type of bacteria often lives in the lining of the stomach. The infection can cause ulcers and irritation (gastritis) in some people. It is the most common cause of ulcers in the stomach (gastric ulcer) and in the upper part of the intestine (duodenal ulcer). Having this infection may also increase the risk of stomach cancer and a type of white blood cell cancer (lymphoma) that affects the stomach. What are the causes? H. pylori is a type of bacteria that is often found in the stomachs of healthy people. The bacteria may be passed from person to person through contact with stool or saliva. It is not known why some people develop ulcers, gastritis, or cancer from the infection. What increases the risk? This condition is more likely to develop in people who:  Have family members with the infection.  Live with many other people, such as in a dormitory.  Are of African, Hispanic, or Asian descent.  What are the signs or symptoms? Most people with this infection do not have symptoms. If you do have symptoms, they may include:  Heartburn.  Stomach pain.  Nausea.  Vomiting.  Blood-tinged vomit.  Loss of appetite.  Bad breath.  How is this diagnosed? This condition may be diagnosed based on your symptoms, a physical exam, and various tests. Tests may include:  Blood tests or stool tests to check for the proteins (antibodies) that your body may produce in response to the bacteria. These tests are the best way to confirm the diagnosis.  A breath test to check for the type of gas that the H. pylori bacteria release after breaking down a substance called urea. For the test, you are asked to drink urea. This test is often done after treatment in order to find out  if the treatment worked.  A procedure in which a thin, flexible tube with a tiny camera at the end is placed into your stomach and upper intestine (upper endoscopy). Your health care provider may also take tissue samples (biopsy) to test for H. pylori and cancer.  How is this treated? Treatment for this condition usually involves taking a combination of medicines (triple therapy) for a couple of weeks. Triple therapy includes one medicine to reduce the acid in your stomach and two types of antibiotic medicines. Many drug combinations have been approved for treatment. Treatment usually kills the H. pylori and reduces your risk of cancer. You may need to be tested for H. pylori again after treatment. In some cases, the treatment may need to be repeated. Follow these instructions at home:  Take over-the-counter and prescription medicines only as told by your health care provider.  Take your antibiotics as told by your health care provider. Do not stop taking the antibiotics even if you start to feel better.  You can do all your usual activities and eat what you usually do.  Take steps to prevent future infections: ? Wash your hands often. ? Make sure the food you eat has been properly prepared. ? Drink water only from clean sources.  Keep all follow-up visits as told by your health care provider. This is important. Contact a health care provider if:  Your symptoms do not get better.  Your symptoms return after treatment. This information is not intended to  replace advice given to you by your health care provider. Make sure you discuss any questions you have with your health care provider. Document Released: 03/29/2016 Document Revised: 05/13/2016 Document Reviewed: 12/18/2014 Elsevier Interactive Patient Education  2018 [employer].

## 2018-07-18 NOTE — Progress Notes (Signed)
BP 124/82 (BP Location: Left Arm, Patient Position: Sitting, Cuff Size: Large)   Pulse 73   Temp 97.8 F (36.6 C) (Oral)   Ht 5\' 10"  (1.778 m)   Wt 242 lb 12 oz (110.1 kg)   SpO2 95%   BMI 34.83 kg/m    CC: ongoing chest pain Subjective:    Patient ID: Jordan Buchanan, male    DOB: 1970-12-29, 47 y.o.   MRN: [id number]  HPI: Jordan Buchanan is a 47 y.o. male presenting on 07/18/2018 for Chest Pain (Pt c/o ongoing chest pain. Has had about 2 mos. Says the night of 07/13/18, had episode bad heartburn and had swelling in bilateral feet. Pt called the office and was prescribed sulcralfate, which helped.  )   See prior note for details.  2+ mo h/o chest discomfort described as pleuritic pressure/tightness, not exertional or relieved by rest. Hydroxyzine was not helpful.   Had another episode last week - bilateral leg swelling from knees down starting Wednesday night. Bad GERD on Thursday. Leg swelling recurred Saturday.   Carafate started on Friday - this has significantly helped.  Cough is much better but not fully resolved.   Relevant past medical, surgical, family and social history reviewed and updated as indicated. Interim medical history since our last visit reviewed. Allergies and medications reviewed and updated. Outpatient Medications Prior to Visit  Medication Sig Dispense Refill  . albuterol (VENTOLIN HFA) 108 (90 Base) MCG/ACT inhaler Inhale 2 puffs into the lungs every 4 (four) hours as needed for wheezing or shortness of breath. 1 Inhaler 5  . amLODipine (NORVASC) 10 MG tablet TAKE 1 TABLET (10 MG TOTAL) BY MOUTH DAILY. 90 tablet 2  . cetirizine (ZYRTEC) 10 MG tablet Take 10 mg by mouth at bedtime. As needed for seasonal allergies    . hydrOXYzine (ATARAX/VISTARIL) 25 MG tablet Take 0.5-1 tablets (12.5-25 mg total) by mouth 2 (two) times daily as needed for anxiety. 30 tablet 1  . metoprolol succinate (TOPROL-XL) 50 MG 24 hr tablet TAKE 1 TABLET BY MOUTH EVERY DAY 30 tablet 0    . omeprazole (PRILOSEC) 40 MG capsule Take 1 capsule (40 mg total) by mouth daily. 30 capsule 5  . sucralfate (CARAFATE) 1 g tablet Take 1 tablet (1 g total) by mouth 3 (three) times daily with meals. 90 tablet 0  . Carbinoxamine Maleate 4 MG TABS Take 1 tablet (4 mg total) by mouth every 6 (six) hours as needed. 28 each 5  . ranitidine (ZANTAC) 150 MG tablet Take 1 tablet (150 mg total) by mouth at bedtime. (Patient not taking: Reported on 07/18/2018)     No facility-administered medications prior to visit.      Per HPI unless specifically indicated in ROS section below Review of Systems     Objective:    BP 124/82 (BP Location: Left Arm, Patient Position: Sitting, Cuff Size: Large)   Pulse 73   Temp 97.8 F (36.6 C) (Oral)   Ht 5\' 10"  (1.778 m)   Wt 242 lb 12 oz (110.1 kg)   SpO2 95%   BMI 34.83 kg/m   Wt Readings from Last 3 Encounters:  07/18/18 242 lb 12 oz (110.1 kg)  07/04/18 239 lb 8 oz (108.6 kg)  06/12/18 236 lb 6.4 oz (107.2 kg)    Physical Exam  Constitutional: He appears well-developed and well-nourished. No distress.  HENT:  Mouth/Throat: Oropharynx is clear and moist. No oropharyngeal exudate.  Eyes: Pupils are equal, round, and reactive to  light. EOM are normal.  Cardiovascular: Normal rate, regular rhythm and normal heart sounds.  No murmur heard. Pulmonary/Chest: Effort normal and breath sounds normal. No respiratory distress. He has no wheezes. He has no rales.  Abdominal: Soft. Bowel sounds are normal. He exhibits no distension and no mass. There is no tenderness. There is no guarding.  Musculoskeletal: He exhibits no edema.  Nursing note and vitals reviewed.  Results for orders placed or performed in visit on 07/07/18  Lipid panel  Result Value Ref Range   Triglycerides 100 40 - 160   Cholesterol 158 0 - 200   HDL 42 35 - 70   LDL Cholesterol 96   Hepatic function panel  Result Value Ref Range   Alkaline Phosphatase 65 25 - 125   ALT 23 10 - 40    AST 20 14 - 40   Bilirubin, Total 1.3   TSH  Result Value Ref Range   TSH 0.42 0.41 - 5.90      Assessment & Plan:   Problem List Items Addressed This Visit    GERD (gastroesophageal reflux disease) - Primary    Acute worsening last week, improving since carafate was started. Will check H pylori IgG Ab today. Zantac was not effective.       Relevant Orders   H. pylori antibody, IgG   Chest pain    Atypical chest pain, now anticipate more GI related - carafate has been most effective treatment to date. Hydroxyzine has helped some as well. Check H pylori. If positive IgG, consider stool antigen test after he stops PPI for 2 weeks. Pt agrees with plan.       Relevant Orders   H. pylori antibody, IgG       No orders of the defined types were placed in this encounter.  Orders Placed This Encounter  Procedures  . H. pylori antibody, IgG    Follow up plan: Return if symptoms worsen or fail to improve.  Ria Bush, MD

## 2018-07-18 NOTE — Assessment & Plan Note (Signed)
Acute worsening last week, improving since carafate was started. Will check H pylori IgG Ab today. Zantac was not effective.

## 2018-07-18 NOTE — Assessment & Plan Note (Signed)
Atypical chest pain, now anticipate more GI related - carafate has been most effective treatment to date. Hydroxyzine has helped some as well. Check H pylori. If positive IgG, consider stool antigen test after he stops PPI for 2 weeks. Pt agrees with plan.

## 2018-07-19 ENCOUNTER — Encounter: Payer: Self-pay | Admitting: Podiatry

## 2018-07-19 ENCOUNTER — Ambulatory Visit (INDEPENDENT_AMBULATORY_CARE_PROVIDER_SITE_OTHER): Payer: PRIVATE HEALTH INSURANCE

## 2018-07-19 ENCOUNTER — Ambulatory Visit: Payer: PRIVATE HEALTH INSURANCE | Admitting: Podiatry

## 2018-07-19 DIAGNOSIS — M778 Other enthesopathies, not elsewhere classified: Secondary | ICD-10-CM

## 2018-07-19 DIAGNOSIS — L600 Ingrowing nail: Secondary | ICD-10-CM

## 2018-07-19 DIAGNOSIS — M722 Plantar fascial fibromatosis: Secondary | ICD-10-CM

## 2018-07-19 DIAGNOSIS — M779 Enthesopathy, unspecified: Secondary | ICD-10-CM

## 2018-07-19 MED ORDER — MELOXICAM 15 MG PO TABS: 15.0000 mg | ORAL_TABLET | Freq: Every day | ORAL | 1 refills | Status: DC

## 2018-07-19 MED ORDER — METHYLPREDNISOLONE 4 MG PO TBPK: ORAL_TABLET | ORAL | 0 refills | Status: DC

## 2018-07-19 NOTE — Patient Instructions (Signed)
The day after your procedure:  Place 1/4 cup of epsom salts in a quart of warm tap water.  Submerge your foot or feet in the solution and soak for 20 minutes.  This soak should be done twice a day.  Next, remove your foot or feet from solution, blot dry the affected area. Apply ointment and cover if instructed by your doctor.   IF YOUR SKIN BECOMES IRRITATED WHILE USING THESE INSTRUCTIONS, IT IS OKAY TO SWITCH TO  WHITE VINEGAR AND WATER.  As another alternative soak, you may use antibacterial soap and water.  Monitor for any signs/symptoms of infection. Call the office immediately if any occur or go directly to the emergency room. Call with any questions/concerns.   Long Term Care Instructions-Post Nail Surgery  You have had your ingrown toenail and root treated with a chemical.  This chemical causes a burn that will drain and ooze like a blister.  This can drain for 6-8 weeks or longer.  It is important to keep this area clean, covered, and follow the soaking instructions dispensed at the time of your surgery.  This area will eventually dry and form a scab.  Once the scab forms you no longer need to soak or apply a dressing.  If at any time you experience an increase in pain, redness, swelling, or drainage, you should contact the office as soon as possible. 

## 2018-07-21 ENCOUNTER — Other Ambulatory Visit: Payer: Self-pay | Admitting: Family Medicine

## 2018-07-22 ENCOUNTER — Other Ambulatory Visit: Payer: Self-pay | Admitting: Family Medicine

## 2018-07-25 NOTE — Progress Notes (Signed)
Subjective: Patient presents today for evaluation of pain to the lateral border of the left hallux that began that began several weeks ago. Patient is concerned for possible ingrown nail. Wearing shoes and applying pressure to the toe increases the pain. He has not done anything for treatment.  He also complains of left heel pain that began 2.5 months ago. Walking for long periods of time increases the pain. He has not done anything to treat these symptoms. Patient presents today for further treatment and evaluation.  Past Medical History:  Diagnosis Date  . Arrhythmia    PALPITATIONS AND PVC - improved with cutting down on caffeine  . Cough variant asthma 05/28/2014   05/27/2014 p extensive coaching HFA effectiveness =    75% > try dulera 100 2bid  - PFT's 07/03/2014 FEV1 4.15 (98%) ratio 89 and no chang p B2 and nl fef25-75  And nl dlco    . Diverticulosis 07/2015   by CT scan  . GERD (gastroesophageal reflux disease)   . Hepatic steatosis   . Hypertension    h/o LVH, resolved  . Migraines   . Seasonal allergies   . 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 Estanislado Pandy)  . Tremor   . Urticaria     Objective:  General: Well developed, nourished, in no acute distress, alert and oriented x3   Dermatology: Skin is warm, dry and supple bilateral. Lateral border of the left hallux appears to be erythematous with evidence of an ingrowing nail. Pain on palpation noted to the border of the nail fold. The remaining nails appear unremarkable at this time. There are no open sores, lesions.  Vascular: Dorsalis Pedis artery and Posterior Tibial artery pedal pulses palpable. No lower extremity edema noted.   Neruologic: Grossly intact via light touch bilateral.  Musculoskeletal: Tenderness to palpation to the plantar aspect of the left heel along the plantar fascia. Muscular strength within normal limits in all groups bilateral. Normal range of motion noted to all  pedal and ankle joints.   Radiographic Exam:  Normal osseous mineralization. Joint spaces preserved. No fracture/dislocation/boney destruction.     Assesement: #1 Paronychia with ingrowing nail lateral border left hallux  #2 Pain in toe #3 Incurvated nail #4 plantar fasciitis left   Plan of Care:  1. Patient evaluated. X-Rays reviewed.  2. Discussed treatment alternatives and plan of care. Explained nail avulsion procedure and post procedure course to patient. 3. Patient opted for permanent partial nail avulsion.  4. Prior to procedure, local anesthesia infiltration utilized using 3 ml of a 50:50 mixture of 2% plain lidocaine and 0.5% plain marcaine in a normal hallux block fashion and a betadine prep performed.  5. Partial permanent nail avulsion with chemical matrixectomy performed using [id number] applications of phenol followed by alcohol flush.  6. Light dressing applied. 7. Injection of 0.5 mLs Celestone Soluspan injected into the patient's left heel.  8. Prescription for Medrol Dose Pak provided to patient.  9. Prescription for Meloxicam provided to patient.  10. Return to clinic in 4 weeks.   Works for [employer].   Edrick Kins, DPM [facility]  Dr. Edrick Kins, DPM    [address]  [place], [place] [address]                Office [phone]  Fax [phone]

## 2018-07-26 ENCOUNTER — Telehealth: Payer: Self-pay | Admitting: Family Medicine

## 2018-07-26 DIAGNOSIS — R079 Chest pain, unspecified: Secondary | ICD-10-CM

## 2018-07-26 DIAGNOSIS — K219 Gastro-esophageal reflux disease without esophagitis: Secondary | ICD-10-CM

## 2018-07-26 NOTE — Telephone Encounter (Signed)
Spoke relaying Dr. Synthia Innocent message and instructions.  Pt states he received MyChart message from Dr. Darnell Level to stop meloxicam, so he did.  However, pt says the Pepto Bismol is helping right now.   Also, pt states it is best to call his cell # [phone] to schedule GI appt.

## 2018-07-26 NOTE — Telephone Encounter (Signed)
Copied from [place] [id number]. Topic: Referral - Request >> Jul 26, 2018  2:58 PM Margot Ables wrote: Reason for CRM: pt received mychart msg to go to GI. They called [facility] but cannot be seen until October. Pt wife requesting referral to [facility] as they can see pt this month. Please send to Attn: Hilda Blades, fax# [phone]. Please notify when referral has been sent.

## 2018-07-26 NOTE — Telephone Encounter (Signed)
Please put a referral in for GI and I will fax that over to [facility] at that time. Thank Edrick Kins, RMA

## 2018-07-26 NOTE — Telephone Encounter (Signed)
pts wife (DPR signed) called back and they are waiting on cb about GI referral but also new symptom; pt started with black stool around 07/23/18, has seen no blood in stool. Pt took Peptol Bismol around 07/23/18 and 07/24/18. No diarrhea, abd pain,vomiting,or fever.pt has been constipated and took metamucil on 07/25/18 and 07/26/18; pt had BM today. Pt still has CP and when I asked where CP was located Mrs Pursell said it is in the same spot that it has been in and pt has been cleared by card and pulmonary. Wonders if could be acid reflux. [facility]. Mrs Marczak request cb to pt after reviewed by Dr Darnell Level.

## 2018-07-26 NOTE — Telephone Encounter (Signed)
Noted. GI referral placed for [facility]. Would also check to see if he's been taking meloxicam anti inflammatory prescribed by podiatry (recommend against it - as could worsen symptoms). Stop pepto bismol, let us know if recurrent dark stools.  Lab Results  Component Value Date   WBC 7.8 06/04/2018   HGB 16.0 06/04/2018   HCT 43.6 06/04/2018   MCV 83.2 06/04/2018   PLT 250 06/04/2018

## 2018-07-27 ENCOUNTER — Telehealth: Payer: Self-pay

## 2018-07-27 NOTE — Telephone Encounter (Signed)
Noted. Thanks.

## 2018-07-27 NOTE — Telephone Encounter (Signed)
Spoke with [facility] and left message for patient to call me back. The first available [facility] could schedule patient for 08/11/18 with Dr Therisa Doyne at [place]. Waiting to hear back from patient to advise him of everything. Patient is placed on cancellation list also.Kris Mouton, RMA

## 2018-07-27 NOTE — Telephone Encounter (Signed)
Spoke with patient, got patient in with [facility] instead for 08/02/18 at 8:30 am. This was the soonest and patient aware and patient advised to let us know if symptoms get worse or if he has any concerns.-Kimbrely Buckel V Giulliana Mcroberts, RMA

## 2018-07-31 ENCOUNTER — Ambulatory Visit: Payer: PRIVATE HEALTH INSURANCE | Admitting: Allergy and Immunology

## 2018-08-07 ENCOUNTER — Ambulatory Visit: Payer: PRIVATE HEALTH INSURANCE | Admitting: Allergy and Immunology

## 2018-08-07 ENCOUNTER — Encounter: Payer: Self-pay | Admitting: Allergy and Immunology

## 2018-08-07 VITALS — BP 142/82 | HR 98 | Temp 98.4°F | Resp 20 | Ht 69.2 in | Wt 237.0 lb

## 2018-08-07 DIAGNOSIS — J45991 Cough variant asthma: Secondary | ICD-10-CM

## 2018-08-07 DIAGNOSIS — R053 Chronic cough: Secondary | ICD-10-CM

## 2018-08-07 DIAGNOSIS — J3089 Other allergic rhinitis: Secondary | ICD-10-CM | POA: Diagnosis not present

## 2018-08-07 DIAGNOSIS — R05 Cough: Secondary | ICD-10-CM | POA: Diagnosis not present

## 2018-08-07 DIAGNOSIS — K219 Gastro-esophageal reflux disease without esophagitis: Secondary | ICD-10-CM | POA: Diagnosis not present

## 2018-08-07 NOTE — Progress Notes (Signed)
Follow-up Note  RE: Jordan Buchanan MRN: [id number] DOB: [birth year] Date of Office Visit: 08/07/2018  Primary care provider: Ria Bush, MD Referring provider: Ria Bush, MD  History of present illness: Jordan Buchanan is a 47 y.o. male with cough variant asthma, allergic rhinitis, and gastroesophageal reflux disease presenting today for follow-up.  He was previously seen in this clinic for his initial evaluation on June 12, 2018.  He reports that his cough "got worse" with Spiriva and azelastine nasal spray as prescribed during his initial visit.  After discontinuing these medications, his cough returned to its previous baseline.  He saw a gastroenterologist and was switched from Prilosec twice daily to Nexium twice daily and Carafate was added as well.  He reports that the Nexium and Carafate are "the only way that I am getting relief."  He is scheduled to have EGD on September 01, 2018.  He reports that he was started on prednisone for plantars fasciitis on July 31 and his cough seemed to get much worse, therefore he discontinued the prednisone after 2 or 3 days.  After discontinuing the prednisone his cough returned to baseline.  He does not have any nasal or sinus symptom complaints today.  Assessment and plan: Cough, persistent The patient's lack of response to Spiriva and azelastine, increased symptoms with prednisone, and improvement with Nexium and Carafate suggest that his persistent cough is secondary to acid reflux.  We will not restart azelastine or Spiriva.  Continue appropriate reflux lifestyle modifications, medications as prescribed by gastroenterologist, and to follow-up with gastroenterologist for EGD in mid September.   Diagnostics: Spirometry:  Normal with an FEV1 of 91% predicted with an FEV1 ratio of 104%.  Please see scanned spirometry results for details.    Physical examination: Blood pressure (!) 142/82, pulse 98, temperature 98.4 F (36.9 C),  temperature source Oral, resp. rate 20, height 5' 9.2" (1.758 m), weight 237 lb (107.5 kg), SpO2 96 %.  General: Alert, interactive, in no acute distress. HEENT: TMs pearly gray, turbinates mildly edematous without discharge, post-pharynx unremarkable. Neck: Supple without lymphadenopathy. Lungs: Clear to auscultation without wheezing, rhonchi or rales. CV: Normal S1, S2 without murmurs. Skin: Warm and dry, without lesions or rashes.  The following portions of the patient's history were reviewed and updated as appropriate: allergies, current medications, past family history, past medical history, past social history, past surgical history and problem list.  Allergies as of 08/07/2018   No Known Allergies     Medication List        Accurate as of 08/07/18 10:27 PM. Always use your most recent med list.          albuterol 108 (90 Base) MCG/ACT inhaler Commonly known as:  PROVENTIL HFA;VENTOLIN HFA Inhale 2 puffs into the lungs every 4 (four) hours as needed for wheezing or shortness of breath.   amLODipine 10 MG tablet Commonly known as:  NORVASC TAKE 1 TABLET (10 MG TOTAL) BY MOUTH DAILY.   cetirizine 10 MG tablet Commonly known as:  ZYRTEC Take 10 mg by mouth at bedtime. As needed for seasonal allergies   esomeprazole 40 MG capsule Commonly known as:  NEXIUM Take 40 mg by mouth 2 (two) times daily before a meal.   metoprolol succinate 50 MG 24 hr tablet Commonly known as:  TOPROL-XL TAKE 1 TABLET BY MOUTH EVERY DAY   sucralfate 1 g tablet Commonly known as:  CARAFATE Take 1 tablet (1 g total) by mouth 3 (three) times daily with meals.  No Known Allergies  I appreciate the opportunity to take part in Brendin's care. Please do not hesitate to contact me with questions.  Sincerely,   R. Edgar Frisk, MD

## 2018-08-07 NOTE — Assessment & Plan Note (Signed)
The patient's lack of response to Spiriva and azelastine, increased symptoms with prednisone, and improvement with Nexium and Carafate suggest that his persistent cough is secondary to acid reflux.  We will not restart azelastine or Spiriva.  Continue appropriate reflux lifestyle modifications, medications as prescribed by gastroenterologist, and to follow-up with gastroenterologist for EGD in mid September.

## 2018-08-07 NOTE — Patient Instructions (Signed)
Cough, persistent The patient's lack of response to Spiriva and azelastine, increased symptoms with prednisone, and improvement with Nexium and Carafate suggest that his persistent cough is secondary to acid reflux.  We will not restart azelastine or Spiriva.  Continue appropriate reflux lifestyle modifications, medications as prescribed by gastroenterologist, and to follow-up with gastroenterologist for EGD in mid September.   Return if symptoms worsen or fail to improve.

## 2018-08-11 ENCOUNTER — Other Ambulatory Visit: Payer: Self-pay | Admitting: Family Medicine

## 2018-08-12 ENCOUNTER — Other Ambulatory Visit: Payer: Self-pay | Admitting: Family Medicine

## 2018-08-16 ENCOUNTER — Ambulatory Visit: Payer: PRIVATE HEALTH INSURANCE | Admitting: Podiatry

## 2018-08-16 ENCOUNTER — Encounter: Payer: Self-pay | Admitting: Podiatry

## 2018-08-16 DIAGNOSIS — M722 Plantar fascial fibromatosis: Secondary | ICD-10-CM

## 2018-08-16 DIAGNOSIS — L600 Ingrowing nail: Secondary | ICD-10-CM

## 2018-08-19 NOTE — Progress Notes (Signed)
   Subjective: Patient presents today 2 weeks post ingrown nail permanent nail avulsion procedure of the lateral border of the left hallux. He is also here for follow up evaluation of plantar fasciitis of the left foot. He states both areas have improved. He reports some continued pain in the left heel. He has been wearing a brace on his foot but it is getting old. Patient states that the toe and nail fold is feeling much better.  Past Medical History:  Diagnosis Date  . Arrhythmia    PALPITATIONS AND PVC - improved with cutting down on caffeine  . Cough variant asthma 05/28/2014   05/27/2014 p extensive coaching HFA effectiveness =    75% > try dulera 100 2bid  - PFT's 07/03/2014 FEV1 4.15 (98%) ratio 89 and no chang p B2 and nl fef25-75  And nl dlco    . Diverticulosis 07/2015   by CT scan  . GERD (gastroesophageal reflux disease)   . Hepatic steatosis   . Hypertension    h/o LVH, resolved  . Migraines   . Seasonal allergies   . 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 Estanislado Pandy)  . Tremor   . Urticaria     Objective: Skin is warm, dry and supple. Nail and respective nail fold appears to be healing appropriately. Open wound to the associated nail fold with a granular wound base and moderate amount of fibrotic tissue. Minimal drainage noted. Mild erythema around the periungual region likely due to phenol chemical matricectomy. Tenderness to palpation to the plantar aspect of the left heel along the plantar fascia.   Assessment: #1 postop permanent partial nail avulsion lateral border left hallux #2 open wound periungual nail fold of respective digit.  #3 plantar fasciitis left  Plan of care: #1 patient was evaluated  #2 debridement of open wound was performed to the periungual border of the respective toe using a currette. Antibiotic ointment and Band-Aid was applied. #3 Injection of 0.5 mLs Celestone Soluspan injected into the left heel.  #4 Continue  wearing plantar fascial brace.  #5 Preauthorization request sent to [facility] for custom molded orthotics.  #6 Discontinue taking oral medication due to GI complications.  #7 patient is to return to clinic in 4 weeks.  Works for [employer].    Edrick Kins, DPM [facility]  Dr. Edrick Kins, [address]                                        [place], [place] [address]                Office [phone]  Fax [phone]

## 2018-08-20 HISTORY — PX: ESOPHAGOGASTRODUODENOSCOPY: SHX1529

## 2018-08-23 ENCOUNTER — Other Ambulatory Visit: Payer: Self-pay | Admitting: Family Medicine

## 2018-08-23 ENCOUNTER — Encounter: Payer: Self-pay | Admitting: Family Medicine

## 2018-08-23 MED ORDER — SUCRALFATE 1 G PO TABS: 1.0000 g | ORAL_TABLET | Freq: Three times a day (TID) | ORAL | 0 refills | Status: DC

## 2018-08-24 ENCOUNTER — Telehealth: Payer: Self-pay | Admitting: Podiatry

## 2018-08-24 ENCOUNTER — Telehealth: Payer: Self-pay

## 2018-08-24 MED ORDER — ESOMEPRAZOLE MAGNESIUM 40 MG PO CPDR: DELAYED_RELEASE_CAPSULE | ORAL | 0 refills | Status: DC

## 2018-08-24 NOTE — Telephone Encounter (Signed)
Sent refills for carafate and Nexium.

## 2018-08-24 NOTE — Telephone Encounter (Signed)
Received MyChart message from pt requesting refill for Nexium 40 mg [See Pt Msg, 08/23/18].  Sent refill.

## 2018-08-24 NOTE — Telephone Encounter (Signed)
Left message for pt per Dr Amalia Hailey orthotics are covered at 80% after 750.00 deductible and he has only met 154.34 so the 398.00( cost of orthotics) would be applied toward deductible. I asked if he could please call and let me know if he is wanting to proceed or discuss at next appt.

## 2018-08-31 ENCOUNTER — Other Ambulatory Visit: Payer: Self-pay | Admitting: Family Medicine

## 2018-08-31 DIAGNOSIS — K760 Fatty (change of) liver, not elsewhere classified: Secondary | ICD-10-CM

## 2018-08-31 DIAGNOSIS — I10 Essential (primary) hypertension: Secondary | ICD-10-CM

## 2018-09-01 ENCOUNTER — Other Ambulatory Visit: Payer: No Typology Code available for payment source

## 2018-09-08 ENCOUNTER — Ambulatory Visit (INDEPENDENT_AMBULATORY_CARE_PROVIDER_SITE_OTHER): Payer: No Typology Code available for payment source | Admitting: Family Medicine

## 2018-09-08 ENCOUNTER — Encounter: Payer: Self-pay | Admitting: Family Medicine

## 2018-09-08 VITALS — BP 126/80 | HR 89 | Temp 98.2°F | Ht 69.75 in | Wt 235.5 lb

## 2018-09-08 DIAGNOSIS — K2100 Gastro-esophageal reflux disease with esophagitis, without bleeding: Secondary | ICD-10-CM

## 2018-09-08 DIAGNOSIS — R21 Rash and other nonspecific skin eruption: Secondary | ICD-10-CM

## 2018-09-08 DIAGNOSIS — Z23 Encounter for immunization: Secondary | ICD-10-CM | POA: Diagnosis not present

## 2018-09-08 DIAGNOSIS — I10 Essential (primary) hypertension: Secondary | ICD-10-CM

## 2018-09-08 DIAGNOSIS — E669 Obesity, unspecified: Secondary | ICD-10-CM | POA: Diagnosis not present

## 2018-09-08 DIAGNOSIS — Z0000 Encounter for general adult medical examination without abnormal findings: Secondary | ICD-10-CM | POA: Diagnosis not present

## 2018-09-08 MED ORDER — DOXYCYCLINE HYCLATE 100 MG PO TABS: 100.0000 mg | ORAL_TABLET | Freq: Two times a day (BID) | ORAL | 0 refills | Status: DC

## 2018-09-08 NOTE — Assessment & Plan Note (Addendum)
Recent EGD suspicious for EoE - pending biopsy results. Continue nexium 40mg  bid and carafate PRN for now.

## 2018-09-08 NOTE — Patient Instructions (Addendum)
Flu shot today Tdap today.  Short doxycycline course for rash.  We will await biopsy results.  Good to see you today.  Health Maintenance, Male A healthy lifestyle and preventive care is important for your health and wellness. Ask your health care provider about what schedule of regular examinations is right for you. What should I know about weight and diet? Eat a Healthy Diet  Eat plenty of vegetables, fruits, whole grains, low-fat dairy products, and lean protein.  Do not eat a lot of foods high in solid fats, added sugars, or salt.  Maintain a Healthy Weight Regular exercise can help you achieve or maintain a healthy weight. You should:  Do at least 150 minutes of exercise each week. The exercise should increase your heart rate and make you sweat (moderate-intensity exercise).  Do strength-training exercises at least twice a week.  Watch Your Levels of Cholesterol and Blood Lipids  Have your blood tested for lipids and cholesterol every 5 years starting at 45 years of age. If you are at high risk for heart disease, you should start having your blood tested when you are 45 years old. You may need to have your cholesterol levels checked more often if: ? Your lipid or cholesterol levels are high. ? You are older than 45 years of age. ? You are at high risk for heart disease.  What should I know about cancer screening? Many types of cancers can be detected early and may often be prevented. Lung Cancer  You should be screened every year for lung cancer if: ? You are a current smoker who has smoked for at least 30 years. ? You are a former smoker who has quit within the past 15 years.  Talk to your health care provider about your screening options, when you should start screening, and how often you should be screened.  Colorectal Cancer  Routine colorectal cancer screening usually begins at 45 years of age and should be repeated every 5-10 years until you are 45 years old. You  may need to be screened more often if early forms of precancerous polyps or small growths are found. Your health care provider may recommend screening at an earlier age if you have risk factors for colon cancer.  Your health care provider may recommend using home test kits to check for hidden blood in the stool.  A small camera at the end of a tube can be used to examine your colon (sigmoidoscopy or colonoscopy). This checks for the earliest forms of colorectal cancer.  Prostate and Testicular Cancer  Depending on your age and overall health, your health care provider may do certain tests to screen for prostate and testicular cancer.  Talk to your health care provider about any symptoms or concerns you have about testicular or prostate cancer.  Skin Cancer  Check your skin from head to toe regularly.  Tell your health care provider about any new moles or changes in moles, especially if: ? There is a change in a mole's size, shape, or color. ? You have a mole that is larger than a pencil eraser.  Always use sunscreen. Apply sunscreen liberally and repeat throughout the day.  Protect yourself by wearing long sleeves, pants, a wide-brimmed hat, and sunglasses when outside.  What should I know about heart disease, diabetes, and high blood pressure?  If you are 77-66 years of age, have your blood pressure checked every 3-5 years. If you are 15 years of age or older, have your  blood pressure checked every year. You should have your blood pressure measured twice-once when you are at a hospital or clinic, and once when you are not at a hospital or clinic. Record the average of the two measurements. To check your blood pressure when you are not at a hospital or clinic, you can use: ? An automated blood pressure machine at a pharmacy. ? A home blood pressure monitor.  Talk to your health care provider about your target blood pressure.  If you are between 60-55 years old, ask your health care  provider if you should take aspirin to prevent heart disease.  Have regular diabetes screenings by checking your fasting blood sugar level. ? If you are at a normal weight and have a low risk for diabetes, have this test once every three years after the age of 86. ? If you are overweight and have a high risk for diabetes, consider being tested at a younger age or more often.  A one-time screening for abdominal aortic aneurysm (AAA) by ultrasound is recommended for men aged 66-75 years who are current or former smokers. What should I know about preventing infection? Hepatitis B If you have a higher risk for hepatitis B, you should be screened for this virus. Talk with your health care provider to find out if you are at risk for hepatitis B infection. Hepatitis C Blood testing is recommended for:  Everyone born from [birth year] through 1963.  Anyone with known risk factors for hepatitis C.  Sexually Transmitted Diseases (STDs)  You should be screened each year for STDs including gonorrhea and chlamydia if: ? You are sexually active and are younger than 45 years of age. ? You are older than 45 years of age and your health care provider tells you that you are at risk for this type of infection. ? Your sexual activity has changed since you were last screened and you are at an increased risk for chlamydia or gonorrhea. Ask your health care provider if you are at risk.  Talk with your health care provider about whether you are at high risk of being infected with HIV. Your health care provider may recommend a prescription medicine to help prevent HIV infection.  What else can I do?  Schedule regular health, dental, and eye exams.  Stay current with your vaccines (immunizations).  Do not use any tobacco products, such as cigarettes, chewing tobacco, and e-cigarettes. If you need help quitting, ask your health care provider.  Limit alcohol intake to no more than 2 drinks per day. One drink equals 12  ounces of beer, 5 ounces of wine, or 1 ounces of hard liquor.  Do not use street drugs.  Do not share needles.  Ask your health care provider for help if you need support or information about quitting drugs.  Tell your health care provider if you often feel depressed.  Tell your health care provider if you have ever been abused or do not feel safe at home. This information is not intended to replace advice given to you by your health care provider. Make sure you discuss any questions you have with your health care provider. Document Released: 06/03/2008 Document Revised: 08/04/2016 Document Reviewed: 09/09/2015 Elsevier Interactive Patient Education  [employer].

## 2018-09-08 NOTE — Progress Notes (Signed)
BP 126/80 (BP Location: Left Arm, Patient Position: Sitting, Cuff Size: Large)   Pulse 89   Temp 98.2 F (36.8 C) (Oral)   Ht 5' 9.75" (1.772 m)   Wt 235 lb 8 oz (106.8 kg)   SpO2 96%   BMI 34.03 kg/m    CC: CPE Subjective:    Patient ID: Jordan Buchanan, male    DOB: [birth year], 47 y.o.   MRN: [id number]  HPI: Jordan Buchanan is a 47 y.o. male presenting on 09/08/2018 for Annual Exam (Pt provided copy of endoscopy result.)   Recent endoscopy - suspected EE s/p biopsies pending f/u 10/2018. Continues nexium BID (insurance has not wanted to ). Has tried backing off carafate but finds this helps. No known food allergies.   New pustular rash on back into extremities, tender. He was around nephew with viral infection.   Preventative: Flu shot yearly Tetanus - overdue Seat belt use discussed Sunscreen use discussed. No changing moles on skin.  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 Interim medical history since our last visit reviewed. Allergies and medications reviewed and updated. Outpatient Medications Prior to Visit  Medication Sig Dispense Refill  . albuterol (VENTOLIN HFA) 108 (90 Base) MCG/ACT inhaler Inhale 2 puffs into the lungs every 4 (four) hours as needed for wheezing or shortness of breath. 1 Inhaler 5  . amLODipine (NORVASC) 10 MG tablet TAKE 1 TABLET (10 MG TOTAL) BY MOUTH DAILY. 90 tablet 2  . cetirizine (ZYRTEC) 10 MG tablet Take 10 mg by mouth at bedtime. As needed for seasonal allergies    . esomeprazole (NEXIUM) 40 MG capsule TAKE 1 CAPSULE BY MOUTH EVERY Buchanan BY MOUTH 30 capsule 0  . metoprolol succinate (TOPROL-XL) 50 MG 24 hr tablet TAKE 1 TABLET BY MOUTH  EVERY Buchanan 30 tablet 3  . sucralfate (CARAFATE) 1 g tablet Take 1 tablet (1 g total) by mouth 3 (three) times daily with meals. 90 tablet 0   No facility-administered medications prior to visit.      Per HPI unless specifically indicated in ROS section below Review of Systems  Constitutional: Negative for activity change, appetite change, chills, fatigue, fever and unexpected weight change.  HENT: Negative for hearing loss.   Eyes: Negative for visual disturbance.  Respiratory: Positive for cough and shortness of breath. Negative for chest tightness and wheezing.   Cardiovascular: Positive for chest pain. Negative for palpitations and leg swelling.  Gastrointestinal: Negative for abdominal distention, abdominal pain, blood in stool, constipation, diarrhea, nausea and vomiting.  Genitourinary: Negative for difficulty urinating and hematuria.  Musculoskeletal: Negative for arthralgias, myalgias and neck pain.  Skin: Positive for rash.  Neurological: Negative for dizziness, seizures, syncope and headaches.  Hematological: Negative for adenopathy. Does not bruise/bleed easily.  Psychiatric/Behavioral: Negative for dysphoric mood. The patient is not nervous/anxious.        Objective:    BP 126/80 (BP Location: Left Arm, Patient Position: Sitting, Cuff Size: Large)   Pulse 89   Temp 98.2 F (36.8 C) (Oral)   Ht 5' 9.75" (1.772 m)   Wt 235 lb 8 oz (106.8 kg)   SpO2 96%   BMI 34.03 kg/m   Wt Readings  from Last 3 Encounters:  09/08/18 235 lb 8 oz (106.8 kg)  08/07/18 237 lb (107.5 kg)  07/18/18 242 lb 12 oz (110.1 kg)    Physical Exam  Constitutional: He is oriented to person, place, and time. He appears well-developed and well-nourished. No distress.  HENT:  Head: Normocephalic and atraumatic.  Right Ear: Hearing, tympanic membrane, external ear and ear canal normal.  Left Ear: Hearing, tympanic membrane, external ear and ear canal normal.  Nose: Nose normal.  Mouth/Throat: Uvula  is midline, oropharynx is clear and moist and mucous membranes are normal. No oropharyngeal exudate, posterior oropharyngeal edema or posterior oropharyngeal erythema.  Eyes: Pupils are equal, round, and reactive to light. Conjunctivae and EOM are normal. No scleral icterus.  Neck: Normal range of motion. Neck supple. No thyromegaly present.  Cardiovascular: Normal rate, regular rhythm, normal heart sounds and intact distal pulses.  No murmur heard. Pulses:      Radial pulses are 2+ on the right side, and 2+ on the left side.  Pulmonary/Chest: Effort normal and breath sounds normal. No respiratory distress. He has no wheezes. He has no rales.  Abdominal: Soft. Bowel sounds are normal. He exhibits no distension and no mass. There is no tenderness. There is no rebound and no guarding.  Musculoskeletal: Normal range of motion. He exhibits no edema.  Lymphadenopathy:    He has no cervical adenopathy.  Neurological: He is alert and oriented to person, place, and time.  CN grossly intact, station and gait intact  Skin: Skin is warm and dry. Rash noted.  Tender erythematous papulopustular rash on arms and into back, R>L, different stages of healing No vesicles, no pruritis  Psychiatric: He has a normal mood and affect. His behavior is normal. Judgment and thought content normal.  Nursing note and vitals reviewed.  Results for orders placed or performed in visit on 07/18/18  H. pylori antibody, IgG  Result Value Ref Range   H Pylori IgG Negative Negative      Assessment & Plan:   Problem List Items Addressed This Visit    Skin rash    Tender pustular rash that started this week. ?folliculitis - Rx doxy course.       Obesity, [name], BMI 30.0-34.9 (see actual BMI)    Encouraged healthy diet to affect sustainable weight loss.       Hypertension    Chronic, stable. Continue current regimen of amlodipine and toprol XL.  HCTZ may have caused rash.       Health maintenance examination -  Primary    Preventative protocols reviewed and updated unless pt declined. Discussed healthy diet and lifestyle.       GERD (gastroesophageal reflux disease)    Recent EGD suspicious for EoE - pending biopsy results. Continue nexium 40mg  bid and carafate PRN for now.        Other Visit Diagnoses    Need for influenza vaccination       Relevant Orders   Flu Vaccine QUAD 36+ mos IM (Completed)   Need for Tdap vaccination       Relevant Orders   Tdap vaccine greater than or equal to 7yo IM (Completed)       Meds ordered this encounter  Medications  . doxycycline (VIBRA-TABS) 100 MG tablet    Sig: Take 1 tablet (100 mg total) by mouth 2 (two) times daily.    Dispense:  14 tablet    Refill:  0   Orders Placed This Encounter  Procedures  . Flu Vaccine QUAD 36+ mos IM  . Tdap vaccine greater than or equal to 7yo IM    Follow up plan: No follow-ups on file.  Ria Bush, MD

## 2018-09-08 NOTE — Assessment & Plan Note (Signed)
Tender pustular rash that started this week. ?folliculitis - Rx doxy course.

## 2018-09-08 NOTE — Assessment & Plan Note (Signed)
Encouraged healthy diet to affect sustainable weight loss 

## 2018-09-08 NOTE — Assessment & Plan Note (Signed)
Preventative protocols reviewed and updated unless pt declined. Discussed healthy diet and lifestyle.  

## 2018-09-08 NOTE — Assessment & Plan Note (Signed)
Chronic, stable. Continue current regimen of amlodipine and toprol XL.  HCTZ may have caused rash.

## 2018-09-13 ENCOUNTER — Encounter: Payer: Self-pay | Admitting: Podiatry

## 2018-09-13 ENCOUNTER — Ambulatory Visit: Payer: PRIVATE HEALTH INSURANCE | Admitting: Podiatry

## 2018-09-13 DIAGNOSIS — L600 Ingrowing nail: Secondary | ICD-10-CM | POA: Diagnosis not present

## 2018-09-13 DIAGNOSIS — M722 Plantar fascial fibromatosis: Secondary | ICD-10-CM

## 2018-09-13 MED ORDER — MELOXICAM 15 MG PO TABS: 15.0000 mg | ORAL_TABLET | Freq: Every day | ORAL | 1 refills | Status: DC

## 2018-09-13 NOTE — Patient Instructions (Signed)

## 2018-09-13 NOTE — Progress Notes (Signed)
Subjective: 47 year old male presenting today for follow up evaluation of plantar fasciitis of the left foot. He states the pain resolves for a couple of days and then returns. He states the pain is worse in the morning when he first gets out of bed. He also reports continued pain to the lateral border of the left hallux. He reports associated redness and swelling of the area. He has not done anything for treatment since his previous nail avulsion procedure on 08/16/18. Patient presents today for further treatment and evaluation.  Past Medical History:  Diagnosis Date  . Arrhythmia    PALPITATIONS AND PVC - improved with cutting down on caffeine  . Cough variant asthma 05/28/2014   05/27/2014 p extensive coaching HFA effectiveness =    75% > try dulera 100 2bid  - PFT's 07/03/2014 FEV1 4.15 (98%) ratio 89 and no chang p B2 and nl fef25-75  And nl dlco    . Diverticulosis 07/2015   by CT scan  . GERD (gastroesophageal reflux disease)   . Hepatic steatosis   . Hypertension    h/o LVH, resolved  . Migraines   . Prostatitis 05/09/2013  . Seasonal allergies   . 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 Estanislado Pandy)  . Tremor   . Urticaria     Objective:  General: Well developed, nourished, in no acute distress, alert and oriented x3   Dermatology: Skin is warm, dry and supple bilateral. Lateral border of the left hallux appears to be erythematous with evidence of an ingrowing nail. Pain on palpation noted to the border of the nail fold. The remaining nails appear unremarkable at this time. There are no open sores, lesions.  Vascular: Dorsalis Pedis artery and Posterior Tibial artery pedal pulses palpable. No lower extremity edema noted.   Neruologic: Grossly intact via light touch bilateral.  Musculoskeletal: Pain with palpation to the plantar aspect of the left heel along the plantar fascia. Muscular strength within normal limits in all groups bilateral. Normal  range of motion noted to all pedal and ankle joints.   Assesement: #1 Paronychia with ingrowing nail lateral border left hallux - recurrent #2 Pain in toe #3 Incurvated nail #4 plantar fasciitis left   Plan of Care:  1. Patient evaluated.  2. Discussed treatment alternatives and plan of care. Explained nail avulsion procedure and post procedure course to patient. 3. Patient opted for temporary partial nail avulsion of the lateral border of the left hallux.  4. Prior to procedure, local anesthesia infiltration utilized using 3 ml of a 50:50 mixture of 2% plain lidocaine and 0.5% plain marcaine in a normal hallux block fashion and a betadine prep performed.  5. Light dressing applied. 6. Injection of 0.5 mLs Celestone Soluspan injected into the left heel.  7. Prescription for Meloxicam provided to patient. If GI results are normal, begin taking daily.  8. Continue using plantar fascial braces.  9. Return to clinic in 4 weeks.   Works for [employer].   Edrick Kins, DPM [facility]  Dr. Edrick Kins, [address]                                        [place], [place] [address]  Office [phone]  Fax [phone]

## 2018-09-27 ENCOUNTER — Other Ambulatory Visit: Payer: PRIVATE HEALTH INSURANCE

## 2018-09-30 ENCOUNTER — Encounter: Payer: Self-pay | Admitting: Family Medicine

## 2018-11-05 ENCOUNTER — Other Ambulatory Visit: Payer: Self-pay | Admitting: Family Medicine

## 2018-11-20 ENCOUNTER — Encounter: Payer: Self-pay | Admitting: Family Medicine

## 2018-11-20 ENCOUNTER — Ambulatory Visit: Payer: No Typology Code available for payment source | Admitting: Family Medicine

## 2018-11-20 VITALS — BP 138/86 | HR 100 | Temp 98.2°F | Ht 69.75 in | Wt 241.5 lb

## 2018-11-20 DIAGNOSIS — M7712 Lateral epicondylitis, left elbow: Secondary | ICD-10-CM | POA: Diagnosis not present

## 2018-11-20 NOTE — Assessment & Plan Note (Addendum)
Anticipate L tennis elbow. rec conservative measures of ice, arm rest, sparing NSAID in GI history (start with tylenol 500mg  tid then use aleve or advil PRN breakthrough pain/inflammation). rec buy tennis elbow strap. Exercises from [facility] pat advisor provided today. Update if not improving with treatment to consider sports med referral.

## 2018-11-20 NOTE — Patient Instructions (Signed)
I think you have left tennis elbow. Treat with arm rest, buy tennis elbow strap, may continue ibuprofen with caution or tylenol. Do exercises provided today.  Let us know if no better for sports med or physical therapy referral.   Tennis Elbow Tennis elbow (lateral epicondylitis) is inflammation of the outer tendons of your forearm close to your elbow. Your tendons attach your muscles to your bones. The outer tendons of your forearm are used to extend your wrist, and they attach on the outside part of your elbow. Tennis elbow is often found in people who play tennis, but anyone may get the condition from repeatedly extending the wrist or turning the forearm. What are the causes? This condition is caused by repeatedly extending your wrist and using your hands. It can result from sports or work that requires repetitive forearm movements. Tennis elbow may also be caused by an injury. What increases the risk? You have a higher risk of developing tennis elbow if you play tennis or another racquet sport. You also have a higher risk if you frequently use your hands for work. This condition is also more likely to develop in:  Musicians.  Carpenters, painters, and plumbers.  Cooks.  Cashiers.  People who work in [employer].  Architect workers.  Butchers.  People who use computers.  What are the signs or symptoms? Symptoms of this condition include:  Pain and tenderness in your forearm and the outer part of your elbow. You may only feel the pain when you use your arm, or you may feel it even when you are not using your arm.  A burning feeling that runs from your elbow through your arm.  Weak grip in your hands.  How is this diagnosed? This condition may be diagnosed by medical history and physical exam. You may also have other tests, including:  X-rays.  MRI.  How is this treated? Your health care provider will recommend lifestyle adjustments, such as resting and icing your arm.  Treatment may also include:  Medicines for inflammation. This may include shots of cortisone if your pain continues.  Physical therapy. This may include massage or exercises.  An elbow brace.  Surgery may eventually be recommended if your pain does not go away with treatment. Follow these instructions at home: Activity  Rest your elbow and wrist as directed by your health care provider. Try to avoid any activities that caused the problem until your health care provider says that you can do them again.  If a physical therapist teaches you exercises, do all of them as directed.  If you lift an object, lift it with your palm facing upward. This lowers the stress on your elbow. Lifestyle  If your tennis elbow is caused by sports, check your equipment and make sure that: ? You are using it correctly. ? It is the best fit for you.  If your tennis elbow is caused by work, take breaks frequently, if you are able. Talk with your manager about how to best perform tasks in a way that is safe. ? If your tennis elbow is caused by computer use, talk with your manager about any changes that can be made to your work environment. General instructions  If directed, apply ice to the painful area: ? Put ice in a plastic bag. ? Place a towel between your skin and the bag. ? Leave the ice on for 20 minutes, 2-3 times per day.  Take medicines only as directed by your health care provider.  If you were given a brace, wear it as directed by your health care provider.  Keep all follow-up visits as directed by your health care provider. This is important. Contact a health care provider if:  Your pain does not get better with treatment.  Your pain gets worse.  You have numbness or weakness in your forearm, hand, or fingers. This information is not intended to replace advice given to you by your health care provider. Make sure you discuss any questions you have with your health care  provider. Document Released: 12/06/2005 Document Revised: 08/05/2016 Document Reviewed: 12/02/2014 Elsevier Interactive Patient Education  [employer].

## 2018-11-20 NOTE — Progress Notes (Signed)
BP 138/86 (BP Location: Right Arm, Patient Position: Sitting, Cuff Size: Large)   Pulse 100   Temp 98.2 F (36.8 C) (Oral)   Ht 5' 9.75" (1.772 m)   Wt 241 lb 8 oz (109.5 kg)   SpO2 95%   BMI 34.90 kg/m    CC: L elbow pain Subjective:    Patient ID: Jordan Buchanan, male    DOB: 22-Nov-1971, 47 y.o.   MRN: [id number]  HPI: Jordan Buchanan is a 47 y.o. male presenting on 11/20/2018 for Elbow Pain (C/o left elbow pain that started about 2 wks ago and is worsening. Feels pain in bicep now and noticed a knot on the elbow. )   2 wk h/o progressively worsening L elbow pain. Points to lateral elbow. Pain just with flexing elbow, worse pain with lifting something heavy. Denies inciting trauma/injury. Feels knot lateral elbow. Tylenol and ibuprofen helpful.   No paresthesias or numbness of left arm/hand, no neck pain.   Relevant past medical, surgical, family and social history reviewed and updated as indicated. Interim medical history since our last visit reviewed. Allergies and medications reviewed and updated. Outpatient Medications Prior to Visit  Medication Sig Dispense Refill  . albuterol (VENTOLIN HFA) 108 (90 Base) MCG/ACT inhaler Inhale 2 puffs into the lungs every 4 (four) hours as needed for wheezing or shortness of breath. 1 Inhaler 5  . amLODipine (NORVASC) 10 MG tablet TAKE 1 TABLET (10 MG TOTAL) BY MOUTH DAILY. 90 tablet 2  . cetirizine (ZYRTEC) 10 MG tablet Take 10 mg by mouth at bedtime. As needed for seasonal allergies    . esomeprazole (NEXIUM) 40 MG capsule TAKE 1 CAPSULE BY MOUTH EVERY DAY BY MOUTH 30 capsule 4  . metoprolol succinate (TOPROL-XL) 50 MG 24 hr tablet TAKE 1 TABLET BY MOUTH EVERY DAY 30 tablet 3  . doxycycline (VIBRA-TABS) 100 MG tablet Take 1 tablet (100 mg total) by mouth 2 (two) times daily. 14 tablet 0  . esomeprazole (NEXIUM) 40 MG capsule TAKE 1 CAPSULE BY MOUTH EVERY DAY BY MOUTH 30 capsule 0  . sucralfate (CARAFATE) 1 g tablet Take 1 tablet (1 g total)  by mouth 3 (three) times daily with meals. 90 tablet 0   No facility-administered medications prior to visit.      Per HPI unless specifically indicated in ROS section below Review of Systems     Objective:    BP 138/86 (BP Location: Right Arm, Patient Position: Sitting, Cuff Size: Large)   Pulse 100   Temp 98.2 F (36.8 C) (Oral)   Ht 5' 9.75" (1.772 m)   Wt 241 lb 8 oz (109.5 kg)   SpO2 95%   BMI 34.90 kg/m   Wt Readings from Last 3 Encounters:  11/20/18 241 lb 8 oz (109.5 kg)  09/08/18 235 lb 8 oz (106.8 kg)  08/07/18 237 lb (107.5 kg)    Physical Exam  Constitutional: He appears well-developed and well-nourished. No distress.  Musculoskeletal: Normal range of motion. He exhibits no edema.  R arm WNl L arm point tender to palpation at lateral epicondyle not otherwise Tender with strength testing of wrist flexion and supination against resistance   Neurological:  Sensation intact Grip strength intact 5/5 strength BUE  Skin: No rash noted.  Nursing note and vitals reviewed.     Assessment & Plan:   Problem List Items Addressed This Visit    Left lateral epicondylitis - Primary    Anticipate L tennis elbow. rec conservative measures of  ice, arm rest, sparing NSAID in GI history (start with tylenol 500mg  tid then use aleve or advil PRN breakthrough pain/inflammation). rec buy tennis elbow strap. Exercises from [facility] pat advisor provided today. Update if not improving with treatment to consider sports med referral.           No orders of the defined types were placed in this encounter.  No orders of the defined types were placed in this encounter.   Follow up plan: Return if symptoms worsen or fail to improve.  Ria Bush, MD

## 2018-11-25 ENCOUNTER — Ambulatory Visit (INDEPENDENT_AMBULATORY_CARE_PROVIDER_SITE_OTHER): Payer: PRIVATE HEALTH INSURANCE

## 2018-11-25 ENCOUNTER — Ambulatory Visit: Payer: PRIVATE HEALTH INSURANCE | Admitting: Podiatry

## 2018-11-25 DIAGNOSIS — M722 Plantar fascial fibromatosis: Secondary | ICD-10-CM

## 2018-12-01 ENCOUNTER — Other Ambulatory Visit: Payer: Self-pay | Admitting: Family Medicine

## 2018-12-05 ENCOUNTER — Telehealth: Payer: Self-pay | Admitting: Orthotics

## 2018-12-05 NOTE — Telephone Encounter (Signed)
Left message if he made decision on whether he wanted foot orthotics; he was going to check with wife re deductible and we havent' heard from him.

## 2018-12-17 NOTE — Progress Notes (Signed)
Subjective:  Patient ID: Jordan Buchanan, male    DOB: February 19, 1971,  MRN: [id number]  Chief Complaint  Patient presents with  . Plantar Fasciitis    F/U L PF Pt. states," it was good, but off the sudden it got worst again; 7/10 sharp constant pain -worst walking Tx: fascial brace    47 y.o. male presents with the above complaint.  Review of Systems: Negative except as noted in the HPI. Denies N/V/F/Ch.  Past Medical History:  Diagnosis Date  . Arrhythmia    PALPITATIONS AND PVC - improved with cutting down on caffeine  . Cough variant asthma 05/28/2014   05/27/2014 p extensive coaching HFA effectiveness =    75% > try dulera 100 2bid  - PFT's 07/03/2014 FEV1 4.15 (98%) ratio 89 and no chang p B2 and nl fef25-75  And nl dlco    . Diverticulosis 07/2015   by CT scan  . GERD (gastroesophageal reflux disease)   . Hepatic steatosis   . Hypertension    h/o LVH, resolved  . Migraines   . Prostatitis 05/09/2013  . Seasonal allergies   . 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 Estanislado Pandy)  . Tremor   . Urticaria     Current Outpatient Medications:  .  albuterol (VENTOLIN HFA) 108 (90 Base) MCG/ACT inhaler, Inhale 2 puffs into the lungs every 4 (four) hours as needed for wheezing or shortness of breath., Disp: 1 Inhaler, Rfl: 5 .  amLODipine (NORVASC) 10 MG tablet, TAKE 1 TABLET (10 MG TOTAL) BY MOUTH DAILY., Disp: 90 tablet, Rfl: 2 .  cetirizine (ZYRTEC) 10 MG tablet, Take 10 mg by mouth at bedtime. As needed for seasonal allergies, Disp: , Rfl:  .  esomeprazole (NEXIUM) 40 MG capsule, TAKE 1 CAPSULE BY MOUTH EVERY DAY BY MOUTH, Disp: 30 capsule, Rfl: 4 .  metoprolol succinate (TOPROL-XL) 50 MG 24 hr tablet, TAKE 1 TABLET BY MOUTH EVERY DAY, Disp: 90 tablet, Rfl: 3  Social History   Tobacco Use  Smoking Status Former Smoker  . Packs/day: 1.00  . Years: 13.00  . Pack years: 13.00  . Types: Cigarettes  . Last attempt to quit: 12/21/1999  . Years since quitting:  19.0  Smokeless Tobacco Former Systems developer  . Types: Snuff  . Quit date: 12/20/2013    No Known Allergies Objective:  There were no vitals filed for this visit. There is no height or weight on file to calculate BMI. Constitutional Well developed. Well nourished.  Vascular Dorsalis pedis pulses palpable bilaterally. Posterior tibial pulses palpable bilaterally. Capillary refill normal to all digits.  No cyanosis or clubbing noted. Pedal hair growth normal.  Neurologic Normal speech. Oriented to person, place, and time. Epicritic sensation to light touch grossly present bilaterally.  Dermatologic Nails well groomed and normal in appearance. No open wounds. No skin lesions.  Orthopedic: Normal joint ROM without pain or crepitus bilaterally. No visible deformities. Tender to palpation at the calcaneal tuber right. No pain with calcaneal squeeze right. Ankle ROM diminished range of motion right. Silfverskiold Test: positive right.   Radiographs: Taken and reviewed. No acute fractures or dislocations. No evidence of stress fracture.  Plantar heel spur present.   Assessment:   1. Plantar fasciitis    Plan:  Patient was evaluated and treated and all questions answered.  Plantar Fasciitis, right - XR reviewed as above.  - Educated on icing and stretching. Instructions given.  - Injection delivered to the plantar fascia as  below. - DME: would benefit from CMOs. Cast made.  Procedure: Injection Tendon/Ligament Location: Right plantar fascia at the glabrous junction; medial approach. Skin Prep: alcohol Injectate: 1 cc 0.5% marcaine plain, 1 cc dexamethasone phosphate, 0.5 cc kenalog 10. Disposition: Patient tolerated procedure well. Injection site dressed with a band-aid.  No follow-ups on file.

## 2018-12-21 ENCOUNTER — Telehealth: Payer: Self-pay

## 2018-12-21 DIAGNOSIS — M7712 Lateral epicondylitis, left elbow: Secondary | ICD-10-CM

## 2018-12-21 DIAGNOSIS — M25522 Pain in left elbow: Secondary | ICD-10-CM

## 2018-12-21 NOTE — Telephone Encounter (Signed)
Pt was seen on 11/20/18 and lt elbow pain has worsened. Pt is hurting up into the bicep and cannot lift anything with that arm. No numbness; pain level is 7. Pt has consistent pain in lt elbow but worse when moves.elbow strap did not help and pt got an elbow brace which helps some. Pt wants to know if Dr Darnell Level wants pt to see specialist or sports med. Pt request cb.

## 2018-12-21 NOTE — Telephone Encounter (Signed)
Referral placed for sports medicine.

## 2018-12-22 NOTE — Telephone Encounter (Signed)
Appt made with Dr Frederico Hamman Copland for 12/25/2018, patient notified.

## 2018-12-24 NOTE — Progress Notes (Signed)
Dr. Frederico Hamman T. Miliyah Luper, MD, [facility] [place], [address] Phone: [phone] Fax: [phone]  12/25/2018  Patient: Jordan Buchanan, MRN: [id number], DOB: 10-09-1971, 48 y.o.  Primary Physician:  Ria Bush, MD   Chief Complaint  Patient presents with  . Elbow Pain    Left x 1 month   Subjective:   Jordan Buchanan presents with lateral elbow pain.  Length of symptoms: 6 weeks Hand effected: L  Patient describes a dull ache on the lateral elbow. There is some translation in the proximal forearm and in the distal upper arm. It is painful to lift with the hand facing down and to lift with the thumb in an upright position. Supination is painful. Patient points to the lateral epicondyle as the point of maximal tenderness near ECRB.  He has been wearing his tennis elbow strap for 3 days straight, has some numbness in his hand and forearm. He has been unable to do any rehab due to pain.  No trauma.   No prior fractures or operative interventions in the effective hand. Prior PT or HEP: minimal hep  Denies numbness or tingling. No significant neck or shoulder pain.  Hand of dominance: [employer]  GERD, esophagus stretched  The PMH, PSH, Social History, Family History, Medications, and allergies have been reviewed in [facility], and have been updated if relevant.  Patient Active Problem List   Diagnosis Date Noted  . Left lateral epicondylitis 11/20/2018  . Health maintenance examination 09/08/2018  . Chest pain 07/04/2018  . Urticaria 04/24/2018  . Skin rash 04/24/2018  . Hordeolum internum of right upper eyelid 02/28/2018  . History of asthma 04/28/2017  . Family history of multiple sclerosis 04/27/2017  . Former smoker 04/27/2017  . Spondyloarthropathy   . OSA (obstructive sleep apnea) 08/19/2016  . Low back pain radiating to left lower extremity 08/06/2016  . Obesity, [name], BMI 30.0-34.9 (see  actual BMI) 04/21/2016  . Cough, persistent 03/10/2016  . LLQ abdominal pain 07/31/2015  . Daytime somnolence 07/31/2015  . Cough variant asthma  05/28/2014  . Pruritic condition 04/18/2014  . Hepatic steatosis   . Migraines   . Perennial allergic rhinitis   . GERD (gastroesophageal reflux disease)   . Hypertension     Past Medical History:  Diagnosis Date  . Arrhythmia    PALPITATIONS AND PVC - improved with cutting down on caffeine  . Cough variant asthma 05/28/2014   05/27/2014 p extensive coaching HFA effectiveness =    75% > try dulera 100 2bid  - PFT's 07/03/2014 FEV1 4.15 (98%) ratio 89 and no chang p B2 and nl fef25-75  And nl dlco    . Diverticulosis 07/2015   by CT scan  . GERD (gastroesophageal reflux disease)   . Hepatic steatosis   . Hypertension    h/o LVH, resolved  . Migraines   . Prostatitis 05/09/2013  . Seasonal allergies   . 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 Estanislado Pandy)  . Tremor   . Urticaria     Past Surgical History:  Procedure Laterality Date  . ESOPHAGOGASTRODUODENOSCOPY  08/2018   esophagus dilated, biopsy showed chronic gastritis with mod reflux changes [facility]  . NASAL RECONSTRUCTION WITH SEPTAL REPAIR  2017   [place]  . OTHER SURGICAL HISTORY  1992   facial surgery  . ROTATOR CUFF REPAIR  2009   left  . SINOSCOPY    .  UMBILICAL HERNIA REPAIR  05/10/2012   Procedure: HERNIA REPAIR UMBILICAL ADULT;  Surgeon: Harl Bowie, MD;  Location: [facility];  Service: General;  Laterality: N/A;    Social History   Socioeconomic History  . Marital status: Married    Spouse name: Not on file  . Number of children: 2  . Years of education: Not on file  . Highest education level: Not on file  Occupational History  . Occupation: golf course maintenance    Employer: [employer]  Social Needs  . Financial resource strain: Not on file  . Food insecurity:    Worry: Not on file    Inability:  Not on file  . Transportation needs:    Medical: Not on file    Non-medical: Not on file  Tobacco Use  . Smoking status: Former Smoker    Packs/day: 1.00    Years: 13.00    Pack years: 13.00    Types: Cigarettes    Last attempt to quit: 12/21/1999    Years since quitting: 19.0  . Smokeless tobacco: Former Systems developer    Types: Snuff    Quit date: 12/20/2013  Substance and Sexual Activity  . Alcohol use: No    Alcohol/week: 0.0 standard drinks    Comment: quit 2002  . Drug use: Not Currently    Types: Marijuana    Comment: quit 2002  . Sexual activity: Not on file  Lifestyle  . Physical activity:    Days per week: Not on file    Minutes per session: Not on file  . Stress: Not on file  Relationships  . Social connections:    Talks on phone: Not on file    Gets together: Not on file    Attends religious service: Not on file    Active member of club or organization: Not on file    Attends meetings of clubs or organizations: Not on file    Relationship status: Not on file  . Intimate partner violence:    Fear of current or ex partner: Not on file    Emotionally abused: Not on file    Physically abused: Not on file    Forced sexual activity: Not on file  Other Topics Concern  . 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 Asthma Father   . Arthritis Father        father, brother, Pgrandfather  . Chronic bronchitis Father   . Allergies Father   . Hypertension Brother        obese  . CAD Paternal Grandfather        MI x2  . Multiple sclerosis Brother   . Stroke Neg Hx   . Cancer Neg Hx     No Known Allergies  Medication list reviewed and updated in full in [place].  GEN: No fevers, chills. Nontoxic. Primarily MSK c/o today. MSK:  Detailed in the HPI GI: tolerating PO intake without difficulty Neuro: No numbness, parasthesias, or tingling associated. Otherwise the pertinent positives of the ROS are noted above.   Objective:   Blood pressure 130/80, pulse 70, temperature 98.6 F (37 C), temperature source Oral, height 5' 9.75" (1.772 m), weight  242 lb 8 oz (110 kg).  GEN: Well-developed,well-nourished,in no acute distress; alert,appropriate and cooperative throughout examination HEENT: Normocephalic and atraumatic without obvious abnormalities. Ears, externally no deformities PULM: Breathing comfortably in no respiratory distress EXT: No clubbing, cyanosis, or edema PSYCH: Normally interactive. Cooperative during the interview. Pleasant. Friendly and conversant. Not anxious or depressed appearing. Normal, full affect.  L elbow Ecchymosis or edema: neg ROM: full flexion, extension, pronation, supination Shoulder ROM: Full Flexion: 5/5 Extension: 4/5, PAINFUL Supination: 4/5, PAINFUL Pronation: 5/5 Wrist ext: 5/5 Wrist flexion: 5/5 No gross bony abnormality Varus and Valgus stress: stable ECRB tenderness: YES, TTP Medial epicondyle: NT Lateral epicondyle, resisted wrist extension from wrist full pronation and flexion: PAINFUL grip: 5/5  sensation intact Tinel's, Elbow: negative  Subjective:   Left lateral epicondylitis - Plan: Ambulatory referral to Physical Therapy  >25 minutes spent in face to face time with patient, >50% spent in counselling or coordination of care   Elbow anatomy was reviewed, and tendinopathy was explained.  Pt. given a formal rehab program from [facility] on elbow rehabiliation.  I am starting him on ROM and isometrics only for now.   Use counterforce strap if working or using hands only - I reviewed placement and how to use.   Formal PT would be beneficial, and I am sending him for GRASHEY style maneuvers and ionto / phonophoresis. Emphasized stretching an cross-friction  massage Emphasized proper palms up lifting biomechanics to unload ECRB  Voltaren gel, he has had multiple GI issues and GI has recommended limitation of oral nsaids.   Follow-up: 6 weeks  Signed,  Markiesha Delia T. Venissa Nappi, MD   Patient's Medications  New Prescriptions   DICLOFENAC SODIUM (VOLTAREN) 1 % GEL    Apply 2 g topically 4 (four) times daily.  Previous Medications   ALBUTEROL (VENTOLIN HFA) 108 (90 BASE) MCG/ACT INHALER    Inhale 2 puffs into the lungs every 4 (four) hours as needed for wheezing or shortness of breath.   AMLODIPINE (NORVASC) 10 MG TABLET    TAKE 1 TABLET (10 MG TOTAL) BY MOUTH DAILY.   CETIRIZINE (ZYRTEC) 10 MG TABLET    Take 10 mg by mouth at bedtime. As needed for seasonal allergies   ESOMEPRAZOLE (NEXIUM) 40 MG CAPSULE    TAKE 1 CAPSULE BY MOUTH EVERY DAY BY MOUTH   METOPROLOL SUCCINATE (TOPROL-XL) 50 MG 24 HR TABLET    TAKE 1 TABLET BY MOUTH EVERY DAY  Modified Medications   No medications on file  Discontinued Medications   No medications on file

## 2018-12-25 ENCOUNTER — Ambulatory Visit: Payer: No Typology Code available for payment source | Admitting: Family Medicine

## 2018-12-25 ENCOUNTER — Encounter: Payer: Self-pay | Admitting: *Deleted

## 2018-12-25 ENCOUNTER — Encounter: Payer: Self-pay | Admitting: Family Medicine

## 2018-12-25 VITALS — BP 130/80 | HR 70 | Temp 98.6°F | Ht 69.75 in | Wt 242.5 lb

## 2018-12-25 DIAGNOSIS — M7712 Lateral epicondylitis, left elbow: Secondary | ICD-10-CM

## 2018-12-25 MED ORDER — DICLOFENAC SODIUM 1 % TD GEL: 2.0000 g | Freq: Four times a day (QID) | TRANSDERMAL | 5 refills | Status: DC

## 2019-01-03 ENCOUNTER — Encounter: Payer: Self-pay | Admitting: Podiatry

## 2019-01-03 ENCOUNTER — Encounter: Payer: Self-pay | Admitting: Family Medicine

## 2019-01-03 ENCOUNTER — Ambulatory Visit: Payer: PRIVATE HEALTH INSURANCE | Admitting: Podiatry

## 2019-01-03 DIAGNOSIS — M722 Plantar fascial fibromatosis: Secondary | ICD-10-CM

## 2019-01-03 MED ORDER — METHYLPREDNISOLONE 4 MG PO TBPK: ORAL_TABLET | ORAL | 0 refills | Status: DC

## 2019-01-04 ENCOUNTER — Encounter: Payer: Self-pay | Admitting: Physical Therapy

## 2019-01-04 ENCOUNTER — Ambulatory Visit: Payer: No Typology Code available for payment source | Attending: Family Medicine | Admitting: Physical Therapy

## 2019-01-04 ENCOUNTER — Other Ambulatory Visit: Payer: Self-pay

## 2019-01-04 DIAGNOSIS — M79672 Pain in left foot: Secondary | ICD-10-CM | POA: Insufficient documentation

## 2019-01-04 DIAGNOSIS — R600 Localized edema: Secondary | ICD-10-CM | POA: Diagnosis present

## 2019-01-04 DIAGNOSIS — R262 Difficulty in walking, not elsewhere classified: Secondary | ICD-10-CM | POA: Diagnosis present

## 2019-01-04 DIAGNOSIS — M25522 Pain in left elbow: Secondary | ICD-10-CM | POA: Diagnosis present

## 2019-01-04 NOTE — Therapy (Signed)
[place], [place], [address] Phone: [phone]   Fax:  [phone]  Physical Therapy Evaluation  Patient Details  Name: Jordan Buchanan MRN: [id number] Date of Birth: 07-Aug-1971 Referring Provider (PT): Ferne Reus   Encounter Date: 01/04/2019  PT End of Session - 01/04/19 1756    Visit Number  1    Date for PT Re-Evaluation  03/05/19    PT Start Time  1104    PT Stop Time  1155    PT Time Calculation (min)  51 min    Activity Tolerance  Patient tolerated treatment well    Behavior During Therapy  [facility] for tasks assessed/performed       Past Medical History:  Diagnosis Date  . Arrhythmia    PALPITATIONS AND PVC - improved with cutting down on caffeine  . Cough variant asthma 05/28/2014   05/27/2014 p extensive coaching HFA effectiveness =    75% > try dulera 100 2bid  - PFT's 07/03/2014 FEV1 4.15 (98%) ratio 89 and no chang p B2 and nl fef25-75  And nl dlco    . Diverticulosis 07/2015   by CT scan  . GERD (gastroesophageal reflux disease)   . Hepatic steatosis   . Hypertension    h/o LVH, resolved  . Migraines   . Prostatitis 05/09/2013  . Seasonal allergies   . 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 Estanislado Pandy)  . Tremor   . Urticaria     Past Surgical History:  Procedure Laterality Date  . ESOPHAGOGASTRODUODENOSCOPY  08/2018   esophagus dilated, biopsy showed chronic gastritis with mod reflux changes [facility])  . NASAL RECONSTRUCTION WITH SEPTAL REPAIR  2017   [place]  . OTHER SURGICAL HISTORY  1992   facial surgery  . ROTATOR CUFF REPAIR  2009   left  . SINOSCOPY    . UMBILICAL HERNIA REPAIR  05/10/2012   Procedure: HERNIA REPAIR UMBILICAL ADULT;  Surgeon: Harl Bowie, MD;  Location: [place];  Service: General;  Laterality: N/A;    There were no vitals filed for this visit.   Subjective Assessment - 01/04/19 1110     Subjective  Patient reports that he is unsure of a cause but reports he has had left elbow pain since November.  He reports that he has a knot in the left elbow at times and is very tender.  He also has a script from Dr. Amalia Hailey for left plantar fascitis.  HE has had 4 injections with minimal relief, the MD gave him the option for PT vs surgery.      Limitations  Lifting;Standing;Walking;House hold activities    Patient Stated Goals  have less pain with walking and standing, less elbow pain    Currently in Pain?  Yes    Pain Score  1     Pain Location  Elbow    Pain Orientation  Left;Lateral    Pain Descriptors / Indicators  Sharp    Pain Type  Acute pain    Pain Radiating Towards  denies    Pain Onset  More than a month ago    Pain Frequency  Constant    Aggravating Factors   lifting, gripping and twisting pain up to 8/10    Pain Relieving Factors  not using it, rest, ice pain can be a 1-2/10    Effect of Pain on Daily Activities  reports difficulty with  any lifting, gripping, job duties    Multiple Pain Sites  Yes    Pain Score  4    Pain Location  Foot    Pain Orientation  Left    Pain Descriptors / Indicators  Sharp    Pain Type  Acute pain    Pain Onset  More than a month ago    Pain Frequency  Constant    Aggravating Factors   standing, walking, stairs pain is an 8-9/10    Pain Relieving Factors  being off feet, rest at best pain a 3-4/10    Effect of Pain on Daily Activities  limits walking and standing, trips         [facility] PT Assessment - 01/04/19 0001      Assessment   Medical Diagnosis  left lateral epicondylitis, left plantar fascitis    Referring Provider (PT)  [place], Evans    Onset Date/Surgical Date  12/04/18    Hand Dominance  Right    Prior Therapy  for back about 2-3 year ago      Precautions   Precautions  None      Balance Screen   Has the patient fallen in the past 6 months  No    Has the patient had a decrease in activity level because of a fear of  falling?   No    Is the patient reluctant to leave their home because of a fear of falling?   No      Home Environment   Additional Comments  yardwork      Prior Function   Level of Independence  Independent    Vocation  Full time employment    Vocation Requirements  grounds maintenance, does a lot of standing, gripping, lifting    Leisure  no exercise      Posture/Postural Control   Posture Comments  fwd head, rounded shoulders      ROM / Strength   AROM / PROM / Strength  AROM;PROM;Strength      AROM   AROM Assessment Site  Elbow;Forearm;Wrist;Ankle    Right/Left Elbow  Left    Left Elbow Flexion  140   with pain   Left Elbow Extension  0   with more pain   Right/Left Forearm  Left    Left Forearm Pronation  90 Degrees    Left Forearm Supination  90 Degrees    Right/Left Wrist  Left    Left Wrist Extension  60 Degrees   with pain   Left Wrist Flexion  60 Degrees   pain   Right/Left Ankle  Left    Left Ankle Dorsiflexion  -5      PROM   PROM Assessment Site  Forearm;Wrist    Right/Left Forearm  Left    Right/Left Wrist  Left      Strength   Overall Strength Comments  grip strength right 110#, left 70# with pain, left ankle WFL's    Strength Assessment Site  Forearm;Wrist    Right/Left Forearm  Left    Left Forearm Pronation  4+/5    Left Forearm Supination  3+/5   limited due to pain   Right/Left Wrist  Left    Left Wrist Flexion  4-/5   pain   Left Wrist Extension  3/5   very painful 9/10     Flexibility   Soft Tissue Assessment /Muscle Length  --   very tight calves     Palpation   Palpation  comment  left lateral epicondyle is very tender, mms are rigid in the forearm and tender, very tender and rigid left PF, very tender in the left medial calcaneus      Special Tests   Other special tests  + tennis elbow      Ambulation/Gait   Gait Comments  patient with antalgic gait on the right, mostly at toe off, slight ER of the LE                 Objective measurements completed on examination: See above findings.      [facility] Adult PT Treatment/Exercise - 01/04/19 0001      Modalities   Modalities  Iontophoresis      Iontophoresis   Type of Iontophoresis  Dexamethasone    Location  left lateral epicondyle    Dose  23m    Time  4 hour patch             PT Education - 01/04/19 1755    Education provided  Yes    Education Details  HEP for calf stretches and wrist exetensor stretches, info on inserts for shoes, and brace for elbow    Person(s) Educated  Patient    Methods  Explanation;Demonstration;Handout    Comprehension  Verbalized understanding;Returned demonstration       PT Short Term Goals - 01/04/19 1800      PT SHORT TERM GOAL #1   Title  independent with initial HEP    Time  2    Period  Weeks    Status  New        PT Long Term Goals - 01/04/19 1800      PT LONG TERM GOAL #1   Title  understand proper posture and body mechanics    Time  8    Period  Weeks    Status  New      PT LONG TERM GOAL #2   Title  decrease pain 50%    Time  8    Period  Weeks    Status  New      PT LONG TERM GOAL #3   Title  tolerate standing for an hour    Time  8    Period  Weeks      PT LONG TERM GOAL #4   Title  walk without limp    Time  8    Period  Weeks    Status  New      PT LONG TERM GOAL #5   Title  increase left wrist extension strength to 4/5    Time  8    Period  Weeks    Status  New             Plan - 01/04/19 1757    Clinical Impression Statement  PAtient comes in with two diagnoses from two MD's, has left lateral epicondylitis and left plantar fascitis.  HE has antalgic gait on the left with toe out and decreased toe off, his ROM of the left ankle DF was 5 degrees from neutral with very tight calves.  His left elbow and left heel and plantar foot surface is very tender to palpation.  Left wrist has some limited ROM, his grip strength was decreased as was  elbow supination and wrist extnesion due to significant pain with MMT.    Clinical Presentation  Evolving    Clinical Decision Making  Moderate    Rehab Potential  Good  PT Frequency  2x / week    PT Duration  8 weeks    PT Treatment/Interventions  ADLs/Self Care Home Management;Cryotherapy;Electrical Stimulation;Iontophoresis 74m/ml Dexamethasone;Moist Heat;Ultrasound;Gait training;Stair training;Functional mobility training;Therapeutic activities;Therapeutic exercise;Patient/family education;Manual techniques;Dry needling;Taping;Vasopneumatic Device    PT Next Visit Plan  STM, [place] ionto    Consulted and Agree with Plan of Care  Patient       Patient will benefit from skilled therapeutic intervention in order to improve the following deficits and impairments:  Abnormal gait, Improper body mechanics, Pain, Increased muscle spasms, Decreased mobility, Decreased activity tolerance, Decreased range of motion, Decreased strength, Impaired flexibility, Difficulty walking  Visit Diagnosis: Pain in left elbow - Plan: PT plan of care cert/re-cert  Pain in left foot - Plan: PT plan of care cert/re-cert  Difficulty in walking, not elsewhere classified - Plan: PT plan of care cert/re-cert  Localized edema - Plan: PT plan of care cert/re-cert     Problem List Patient Active Problem List   Diagnosis Date Noted  . Left lateral epicondylitis 11/20/2018  . Health maintenance examination 09/08/2018  . Chest pain 07/04/2018  . Urticaria 04/24/2018  . Skin rash 04/24/2018  . Hordeolum internum of right upper eyelid 02/28/2018  . History of asthma 04/28/2017  . Family history of multiple sclerosis 04/27/2017  . Former smoker 04/27/2017  . Spondyloarthropathy   . OSA (obstructive sleep apnea) 08/19/2016  . Low back pain radiating to left lower extremity 08/06/2016  . Obesity, [name], BMI 30.0-34.9 (see actual BMI) 04/21/2016  . Cough, persistent 03/10/2016  . LLQ abdominal pain 07/31/2015   . Daytime somnolence 07/31/2015  . Cough variant asthma  05/28/2014  . Pruritic condition 04/18/2014  . Hepatic steatosis   . Migraines   . Perennial allergic rhinitis   . GERD (gastroesophageal reflux disease)   . Hypertension     ASumner Boast, PT 01/04/2019, 6:03 PM  [address] [address]Phone: [phone]  Fax:  [phone] Name: SSteward SamesMRN: [id number] of Birth: 112/28/1972

## 2019-01-04 NOTE — Patient Instructions (Signed)
Access Code: [email or web address]  URL: [email or web address]  Date: 01/04/2019  Prepared by: Lum Babe   Exercises  Gastroc Stretch on Wall - 3 reps - 1 sets - 30 hold - 3x daily - 7x weekly  Slant Board Gastrocnemius Stretch - 3 reps - 1 sets - 30 hold - 3x daily - 7x weekly  Standing Wrist Flexion Stretch - 3 reps - 1 sets - 15 hold - 3x daily - 7x weekly

## 2019-01-07 ENCOUNTER — Other Ambulatory Visit: Payer: Self-pay | Admitting: Family Medicine

## 2019-01-07 NOTE — Progress Notes (Signed)
   Subjective: 48 year old male presenting today for follow up evaluation of plantar fasciitis of the left foot. He states his pain has not improved since his last visit. He has received injections in the past that have not provided any significant relief. Walking and standing for long periods of time increases the pain. Patient is here for further evaluation and treatment.   Past Medical History:  Diagnosis Date  . Arrhythmia    PALPITATIONS AND PVC - improved with cutting down on caffeine  . Cough variant asthma 05/28/2014   05/27/2014 p extensive coaching HFA effectiveness =    75% > try dulera 100 2bid  - PFT's 07/03/2014 FEV1 4.15 (98%) ratio 89 and no chang p B2 and nl fef25-75  And nl dlco    . Diverticulosis 07/2015   by CT scan  . GERD (gastroesophageal reflux disease)   . Hepatic steatosis   . Hypertension    h/o LVH, resolved  . Migraines   . Prostatitis 05/09/2013  . Seasonal allergies   . 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 Estanislado Pandy)  . Tremor   . Urticaria      Objective: Physical Exam General: The patient is alert and oriented x3 in no acute distress.  Dermatology: Skin is warm, dry and supple bilateral lower extremities. Negative for open lesions or macerations bilateral.   Vascular: Dorsalis Pedis and Posterior Tibial pulses palpable bilateral.  Capillary fill time is immediate to all digits.  Neurological: Epicritic and protective threshold intact bilateral.   Musculoskeletal: Tenderness to palpation to the plantar aspect of the left heel along the plantar fascia. All other joints range of motion within normal limits bilateral. Strength 5/5 in all groups bilateral.   Assessment: 1. Plantar fasciitis left foot  Plan of Care:  1. Patient evaluated.  2. Patient is going to physical therapy for left tennis elbow. Orders provided to initiate physical therapy for plantar fasciitis.  3. Prescription for Medrol Dose Pak provided to  patient. 4. Cannot tolerate NSAIDs due to GI problems.  5. Discussed EPAT vs physical therapy vs surgery. We may consider surgery in June if no improvement.  6. Patient will contact us regarding custom orthotics.   7. Return to clinic in 6 weeks.   Works for [employer].   Edrick Kins, DPM [facility]  Dr. Edrick Kins, DPM    [address], [place] [address]                Office [phone]  Fax [phone]

## 2019-01-08 ENCOUNTER — Telehealth: Payer: Self-pay | Admitting: Physical Therapy

## 2019-01-08 ENCOUNTER — Ambulatory Visit: Payer: No Typology Code available for payment source | Admitting: Physical Therapy

## 2019-01-08 NOTE — Telephone Encounter (Signed)
Patient did not show for physical therapy appt.  Called patient and left voice message regarding missed appt and reminded him of next scheduled appt on Wednesday.  Requested patient return call at [phone].   Kerin Perna, PTA 01/08/19 8:23 AM

## 2019-01-10 ENCOUNTER — Ambulatory Visit: Payer: No Typology Code available for payment source | Admitting: Physical Therapy

## 2019-01-10 ENCOUNTER — Encounter: Payer: Self-pay | Admitting: Physical Therapy

## 2019-01-10 DIAGNOSIS — R600 Localized edema: Secondary | ICD-10-CM

## 2019-01-10 DIAGNOSIS — M79672 Pain in left foot: Secondary | ICD-10-CM

## 2019-01-10 DIAGNOSIS — M25522 Pain in left elbow: Secondary | ICD-10-CM

## 2019-01-10 DIAGNOSIS — R262 Difficulty in walking, not elsewhere classified: Secondary | ICD-10-CM

## 2019-01-10 NOTE — Therapy (Signed)
[employer] [place], [place], [address] Phone: [phone]   Fax:  [phone]  Physical Therapy Treatment  Patient Details  Name: Jordan Buchanan MRN: [id number] Date of Birth: 1971/09/12 Referring Provider (PT): Ferne Reus   Encounter Date: 01/10/2019  PT End of Session - 01/10/19 1353    Visit Number  2    Date for PT Re-Evaluation  03/05/19    PT Start Time  1301    PT Stop Time  1349    PT Time Calculation (min)  48 min    Activity Tolerance  Patient tolerated treatment well    Behavior During Therapy  [facility] for tasks assessed/performed       Past Medical History:  Diagnosis Date  . Arrhythmia    PALPITATIONS AND PVC - improved with cutting down on caffeine  . Cough variant asthma 05/28/2014   05/27/2014 p extensive coaching HFA effectiveness =    75% > try dulera 100 2bid  - PFT's 07/03/2014 FEV1 4.15 (98%) ratio 89 and no chang p B2 and nl fef25-75  And nl dlco    . Diverticulosis 07/2015   by CT scan  . GERD (gastroesophageal reflux disease)   . Hepatic steatosis   . Hypertension    h/o LVH, resolved  . Migraines   . Prostatitis 05/09/2013  . Seasonal allergies   . 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 Estanislado Pandy)  . Tremor   . Urticaria     Past Surgical History:  Procedure Laterality Date  . ESOPHAGOGASTRODUODENOSCOPY  08/2018   esophagus dilated, biopsy showed chronic gastritis with mod reflux changes [facility])  . NASAL RECONSTRUCTION WITH SEPTAL REPAIR  2017   [place]  . OTHER SURGICAL HISTORY  1992   facial surgery  . ROTATOR CUFF REPAIR  2009   left  . SINOSCOPY    . UMBILICAL HERNIA REPAIR  05/10/2012   Procedure: HERNIA REPAIR UMBILICAL ADULT;  Surgeon: Harl Bowie, MD;  Location: [facility];  Service: General;  Laterality: N/A;    There were no vitals filed for this visit.  Subjective Assessment - 01/10/19 1305    Subjective  Pt reports that he has been on a steroid patch and that has help his elbow ~ 90%     Currently in Pain?  Yes    Pain Score  5     Pain Location  Foot    Pain Orientation  Left                       [facility] Adult PT Treatment/Exercise - 01/10/19 0001      Exercises   Exercises  Ankle      Modalities   Modalities  Ultrasound;Vasopneumatic      Ultrasound   Ultrasound Location  L plantar foot    Ultrasound Parameters  3.3Mhz 1.1w/cm2    Ultrasound Goals  Edema;Pain      Vasopneumatic   Number Minutes Vasopneumatic   15 minutes    Vasopnuematic Location   Ankle;Other (comment)   foot   Vasopneumatic Pressure  Medium    Vasopneumatic Temperature   32      Manual Therapy   Manual Therapy  Soft tissue mobilization    Manual therapy comments  some pain at heel     Soft tissue mobilization  L foot plantar fascia      Ankle Exercises: Stretches  Plantar Fascia Stretch  5 reps;20 seconds               PT Short Term Goals - 01/04/19 1800      PT SHORT TERM GOAL #1   Title  independent with initial HEP    Time  2    Period  Weeks    Status  New        PT Long Term Goals - 01/04/19 1800      PT LONG TERM GOAL #1   Title  understand proper posture and body mechanics    Time  8    Period  Weeks    Status  New      PT LONG TERM GOAL #2   Title  decrease pain 50%    Time  8    Period  Weeks    Status  New      PT LONG TERM GOAL #3   Title  tolerate standing for an hour    Time  8    Period  Weeks      PT LONG TERM GOAL #4   Title  walk without limp    Time  8    Period  Weeks    Status  New      PT LONG TERM GOAL #5   Title  increase left wrist extension strength to 4/5    Time  8    Period  Weeks    Status  New            Plan - 01/10/19 1355    Clinical Impression Statement  Pt reports that his lateral epicondylitis was 90% better since taking steroids. Pain noted at the heel of L foot. Ambulated in and out of  clinic with a normal gait pattern. Positive response to modalities    Rehab Potential  Good    PT Frequency  2x / week    PT Duration  8 weeks    PT Treatment/Interventions  ADLs/Self Care Home Management;Cryotherapy;Electrical Stimulation;Iontophoresis 27m/ml Dexamethasone;Moist Heat;Ultrasound;Gait training;Stair training;Functional mobility training;Therapeutic activities;Therapeutic exercise;Patient/family education;Manual techniques;Dry needling;Taping;Vasopneumatic Device    PT Next Visit Plan  STM, [place] ionto       Patient will benefit from skilled therapeutic intervention in order to improve the following deficits and impairments:  Abnormal gait, Improper body mechanics, Pain, Increased muscle spasms, Decreased mobility, Decreased activity tolerance, Decreased range of motion, Decreased strength, Impaired flexibility, Difficulty walking  Visit Diagnosis: Pain in left foot  Pain in left elbow  Difficulty in walking, not elsewhere classified  Localized edema     Problem List Patient Active Problem List   Diagnosis Date Noted  . Left lateral epicondylitis 11/20/2018  . Health maintenance examination 09/08/2018  . Chest pain 07/04/2018  . Urticaria 04/24/2018  . Skin rash 04/24/2018  . Hordeolum internum of right upper eyelid 02/28/2018  . History of asthma 04/28/2017  . Family history of multiple sclerosis 04/27/2017  . Former smoker 04/27/2017  . Spondyloarthropathy   . OSA (obstructive sleep apnea) 08/19/2016  . Low back pain radiating to left lower extremity 08/06/2016  . Obesity, [name], BMI 30.0-34.9 (see actual BMI) 04/21/2016  . Cough, persistent 03/10/2016  . LLQ abdominal pain 07/31/2015  . Daytime somnolence 07/31/2015  . Cough variant asthma  05/28/2014  . Pruritic condition 04/18/2014  . Hepatic steatosis   . Migraines   . Perennial allergic rhinitis   . GERD (gastroesophageal reflux disease)   . Hypertension  Scot Jun, PTA 01/10/2019,  2:08 PM  [place], [place], [address] Phone: [phone]   Fax:  [phone]  Name: Jordan Buchanan MRN: [id number] Date of Birth: November 02, 1971

## 2019-01-17 ENCOUNTER — Ambulatory Visit: Payer: No Typology Code available for payment source | Admitting: Physical Therapy

## 2019-01-17 DIAGNOSIS — M25522 Pain in left elbow: Secondary | ICD-10-CM

## 2019-01-17 DIAGNOSIS — M79672 Pain in left foot: Secondary | ICD-10-CM

## 2019-01-17 NOTE — Therapy (Signed)
[place], [place], [address] Phone: [phone]   Fax:  [phone]  Physical Therapy Treatment  Patient Details  Name: Jordan Buchanan MRN: [id number] Date of Birth: 06-Apr-1971 Referring Provider (PT): Ferne Reus   Encounter Date: 01/17/2019  PT End of Session - 01/17/19 1219    Visit Number  3    Date for PT Re-Evaluation  03/05/19    PT Start Time  1130    PT Stop Time  1215    PT Time Calculation (min)  45 min       Past Medical History:  Diagnosis Date  . Arrhythmia    PALPITATIONS AND PVC - improved with cutting down on caffeine  . Cough variant asthma 05/28/2014   05/27/2014 p extensive coaching HFA effectiveness =    75% > try dulera 100 2bid  - PFT's 07/03/2014 FEV1 4.15 (98%) ratio 89 and no chang p B2 and nl fef25-75  And nl dlco    . Diverticulosis 07/2015   by CT scan  . GERD (gastroesophageal reflux disease)   . Hepatic steatosis   . Hypertension    h/o LVH, resolved  . Migraines   . Prostatitis 05/09/2013  . Seasonal allergies   . 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 Estanislado Pandy)  . Tremor   . Urticaria     Past Surgical History:  Procedure Laterality Date  . ESOPHAGOGASTRODUODENOSCOPY  08/2018   esophagus dilated, biopsy showed chronic gastritis with mod reflux changes [facility])  . NASAL RECONSTRUCTION WITH SEPTAL REPAIR  2017   [place]  . OTHER SURGICAL HISTORY  1992   facial surgery  . ROTATOR CUFF REPAIR  2009   left  . SINOSCOPY    . UMBILICAL HERNIA REPAIR  05/10/2012   Procedure: HERNIA REPAIR UMBILICAL ADULT;  Surgeon: Harl Bowie, MD;  Location: [facility];  Service: General;  Laterality: N/A;    There were no vitals filed for this visit.  Subjective Assessment - 01/17/19 1216    Subjective  elbow is back hurting once stopped steriod. foot same- no better    Currently in Pain?  Yes    Pain Score  6                         [facility] Adult PT Treatment/Exercise - 01/17/19 0001      Modalities   Modalities  Iontophoresis;Ultrasound      Ultrasound   Ultrasound Location  left PLAntar fascis on stretch and Left lat elbow    Ultrasound Parameters  8 min each 100% cont    Ultrasound Goals  Edema;Pain      Iontophoresis   Type of Iontophoresis  Dexamethasone    Location  left lateral epicondyle    Dose  46m    Time  4 hour patch      Manual Therapy   Manual Therapy  Soft tissue mobilization;Taping    Manual therapy comments  tightness in both but more pain and tenderness in plantar fascia    Soft tissue mobilization  deep STW to left lat elbow and left Plantar fascia    McConnell  left plantar fascia               PT Short Term Goals - 01/17/19 1223      PT SHORT TERM GOAL #1   Title  independent with initial  HEP    Status  Achieved        PT Long Term Goals - 01/04/19 1800      PT LONG TERM GOAL #1   Title  understand proper posture and body mechanics    Time  8    Period  Weeks    Status  New      PT LONG TERM GOAL #2   Title  decrease pain 50%    Time  8    Period  Weeks    Status  New      PT LONG TERM GOAL #3   Title  tolerate standing for an hour    Time  8    Period  Weeks      PT LONG TERM GOAL #4   Title  walk without limp    Time  8    Period  Weeks    Status  New      PT LONG TERM GOAL #5   Title  increase left wrist extension strength to 4/5    Time  8    Period  Weeks    Status  New            Plan - 01/17/19 1221    Clinical Impression Statement  pt arrived today with pain returned in left elbow and no changes in foot. pt did feel ionto helped elbow to reapplied today after STW and [place]. added rigid taping to left PF which he verb felt better with standing. discussed with pt thru tx stretching and use of ice which he is doing as well as braces. STG met    PT Treatment/Interventions  ADLs/Self Care Home  Management;Cryotherapy;Electrical Stimulation;Iontophoresis 23m/ml Dexamethasone;Moist Heat;Ultrasound;Gait training;Stair training;Functional mobility training;Therapeutic activities;Therapeutic exercise;Patient/family education;Manual techniques;Dry needling;Taping;Vasopneumatic Device    PT Next Visit Plan  assess and progress       Patient will benefit from skilled therapeutic intervention in order to improve the following deficits and impairments:  Abnormal gait, Improper body mechanics, Pain, Increased muscle spasms, Decreased mobility, Decreased activity tolerance, Decreased range of motion, Decreased strength, Impaired flexibility, Difficulty walking  Visit Diagnosis: Pain in left foot  Pain in left elbow     Problem List Patient Active Problem List   Diagnosis Date Noted  . Left lateral epicondylitis 11/20/2018  . Health maintenance examination 09/08/2018  . Chest pain 07/04/2018  . Urticaria 04/24/2018  . Skin rash 04/24/2018  . Hordeolum internum of right upper eyelid 02/28/2018  . History of asthma 04/28/2017  . Family history of multiple sclerosis 04/27/2017  . Former smoker 04/27/2017  . Spondyloarthropathy   . OSA (obstructive sleep apnea) 08/19/2016  . Low back pain radiating to left lower extremity 08/06/2016  . Obesity, [name], BMI 30.0-34.9 (see actual BMI) 04/21/2016  . Cough, persistent 03/10/2016  . LLQ abdominal pain 07/31/2015  . Daytime somnolence 07/31/2015  . Cough variant asthma  05/28/2014  . Pruritic condition 04/18/2014  . Hepatic steatosis   . Migraines   . Perennial allergic rhinitis   . GERD (gastroesophageal reflux disease)   . Hypertension     PAYSEUR,ANGIE PTA 01/17/2019, 12:24 PM  [place] [address]Phone: [phone]  Fax:  [phone] Name: Jordan MallickMRN: [id number] of Birth: [birth year]

## 2019-01-22 ENCOUNTER — Encounter: Payer: Self-pay | Admitting: Physical Therapy

## 2019-01-22 ENCOUNTER — Ambulatory Visit: Payer: No Typology Code available for payment source | Attending: Family Medicine | Admitting: Physical Therapy

## 2019-01-22 DIAGNOSIS — M25522 Pain in left elbow: Secondary | ICD-10-CM | POA: Insufficient documentation

## 2019-01-22 DIAGNOSIS — M79672 Pain in left foot: Secondary | ICD-10-CM

## 2019-01-22 DIAGNOSIS — R262 Difficulty in walking, not elsewhere classified: Secondary | ICD-10-CM | POA: Insufficient documentation

## 2019-01-22 DIAGNOSIS — R600 Localized edema: Secondary | ICD-10-CM | POA: Diagnosis present

## 2019-01-22 NOTE — Therapy (Signed)
[place], [place], [address] Phone: [phone]   Fax:  [phone]  Physical Therapy Treatment  Patient Details  Name: Jordan Buchanan MRN: [id number] Date of Birth: Dec 21, 1970 Referring Provider (PT): Ferne Reus   Encounter Date: 01/22/2019  PT End of Session - 01/22/19 1238    Visit Number  4    Date for PT Re-Evaluation  03/05/19    PT Start Time  1145    PT Stop Time  1230    PT Time Calculation (min)  45 min    Activity Tolerance  Patient tolerated treatment well    Behavior During Therapy  [facility] for tasks assessed/performed       Past Medical History:  Diagnosis Date  . Arrhythmia    PALPITATIONS AND PVC - improved with cutting down on caffeine  . Cough variant asthma 05/28/2014   05/27/2014 p extensive coaching HFA effectiveness =    75% > try dulera 100 2bid  - PFT's 07/03/2014 FEV1 4.15 (98%) ratio 89 and no chang p B2 and nl fef25-75  And nl dlco    . Diverticulosis 07/2015   by CT scan  . GERD (gastroesophageal reflux disease)   . Hepatic steatosis   . Hypertension    h/o LVH, resolved  . Migraines   . Prostatitis 05/09/2013  . Seasonal allergies   . 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 Estanislado Pandy)  . Tremor   . Urticaria     Past Surgical History:  Procedure Laterality Date  . ESOPHAGOGASTRODUODENOSCOPY  08/2018   esophagus dilated, biopsy showed chronic gastritis with mod reflux changes [facility])  . NASAL RECONSTRUCTION WITH SEPTAL REPAIR  2017   [place]  . OTHER SURGICAL HISTORY  1992   facial surgery  . ROTATOR CUFF REPAIR  2009   left  . SINOSCOPY    . UMBILICAL HERNIA REPAIR  05/10/2012   Procedure: HERNIA REPAIR UMBILICAL ADULT;  Surgeon: Harl Bowie, MD;  Location: [place] [facility];  Service: General;  Laterality: N/A;    There were no vitals filed for this visit.  Subjective Assessment - 01/22/19 1150    Subjective  "Whatever y'all did last week worked"    Currently in Pain?  No/denies                       [facility] Adult PT Treatment/Exercise - 01/22/19 0001      Modalities   Modalities  Iontophoresis;Ultrasound      Ultrasound   Ultrasound Location  left plantar fascis on stretch And lateral L elbow    Ultrasound Parameters  8 min each 3.3Mhz 1.1w/cm2    Ultrasound Goals  Edema;Pain      Iontophoresis   Type of Iontophoresis  Dexamethasone    Location  left lateral epicondyle    Dose  36m    Time  4 hour patch      Manual Therapy   Manual Therapy  Soft tissue mobilization;Taping    Manual therapy comments  tightness in both but more pain and tenderness in plantar fascia    Soft tissue mobilization  deep STW to left lat elbow and left Plantar fascia    McConnell  left plantar fascia               PT Short Term Goals - 01/17/19 1223      PT SHORT TERM GOAL #1  Title  independent with initial HEP    Status  Achieved        PT Long Term Goals - 01/22/19 1241      PT LONG TERM GOAL #1   Title  understand proper posture and body mechanics    Status  Partially Met      PT LONG TERM GOAL #2   Title  decrease pain 50%    Status  Partially Met            Plan - 01/22/19 1238    Clinical Impression Statement  Positive response to last treatment so interventions remained the same. Positive response to [facility] and modalities.     Rehab Potential  Good    PT Frequency  2x / week    PT Duration  8 weeks    PT Treatment/Interventions  ADLs/Self Care Home Management;Cryotherapy;Electrical Stimulation;Iontophoresis 44m/ml Dexamethasone;Moist Heat;Ultrasound;Gait training;Stair training;Functional mobility training;Therapeutic activities;Therapeutic exercise;Patient/family education;Manual techniques;Dry needling;Taping;Vasopneumatic Device    PT Next Visit Plan  assess and progress       Patient will benefit from skilled therapeutic intervention in  order to improve the following deficits and impairments:  Abnormal gait, Improper body mechanics, Pain, Increased muscle spasms, Decreased mobility, Decreased activity tolerance, Decreased range of motion, Decreased strength, Impaired flexibility, Difficulty walking  Visit Diagnosis: Pain in left elbow  Pain in left foot  Difficulty in walking, not elsewhere classified  Localized edema     Problem List Patient Active Problem List   Diagnosis Date Noted  . Left lateral epicondylitis 11/20/2018  . Health maintenance examination 09/08/2018  . Chest pain 07/04/2018  . Urticaria 04/24/2018  . Skin rash 04/24/2018  . Hordeolum internum of right upper eyelid 02/28/2018  . History of asthma 04/28/2017  . Family history of multiple sclerosis 04/27/2017  . Former smoker 04/27/2017  . Spondyloarthropathy   . OSA (obstructive sleep apnea) 08/19/2016  . Low back pain radiating to left lower extremity 08/06/2016  . Obesity, [name], BMI 30.0-34.9 (see actual BMI) 04/21/2016  . Cough, persistent 03/10/2016  . LLQ abdominal pain 07/31/2015  . Daytime somnolence 07/31/2015  . Cough variant asthma  05/28/2014  . Pruritic condition 04/18/2014  . Hepatic steatosis   . Migraines   . Perennial allergic rhinitis   . GERD (gastroesophageal reflux disease)   . Hypertension     RScot Jun PTA 01/22/2019, 12:41 PM  [place] [address] [address]Phone: [phone]  Fax:  [phone] Name: Jordan AgerMRN: [id number] of Birth: 103-19-1972

## 2019-01-29 ENCOUNTER — Encounter: Payer: Self-pay | Admitting: Family Medicine

## 2019-01-29 ENCOUNTER — Ambulatory Visit: Payer: No Typology Code available for payment source | Admitting: Physical Therapy

## 2019-01-29 NOTE — Telephone Encounter (Signed)
Pt called for an OV but was very aggressive with the scheduler.  Then asked to speak with Junie Panning and was disrespectful and disgruntled with her. Pt has OV with Dr. Einar Pheasant tomorrow. Fyi to Dr. Darnell Level.

## 2019-01-30 ENCOUNTER — Ambulatory Visit: Payer: No Typology Code available for payment source | Admitting: Family Medicine

## 2019-01-31 ENCOUNTER — Encounter: Payer: Self-pay | Admitting: Physical Therapy

## 2019-01-31 ENCOUNTER — Ambulatory Visit: Payer: No Typology Code available for payment source | Admitting: Physical Therapy

## 2019-01-31 DIAGNOSIS — M25522 Pain in left elbow: Secondary | ICD-10-CM

## 2019-01-31 DIAGNOSIS — R262 Difficulty in walking, not elsewhere classified: Secondary | ICD-10-CM

## 2019-01-31 DIAGNOSIS — M79672 Pain in left foot: Secondary | ICD-10-CM

## 2019-01-31 DIAGNOSIS — R600 Localized edema: Secondary | ICD-10-CM

## 2019-01-31 NOTE — Therapy (Signed)
Jordan Buchanan, [address] Buchanan: [phone]   Fax:  [phone]  Physical Therapy Treatment  Patient Details  Name: Jordan Buchanan Date of Birth: 01/05/1971 Referring Provider (PT): Jordan Buchanan  PT End of Session - 01/31/19 1231    Visit Number  5    Date for PT Re-Evaluation  03/05/19    PT Start Time  1145    PT Stop Time  1230    PT Time Calculation (min)  45 min    Activity Tolerance  Patient tolerated treatment well    Behavior During Therapy  Jordan Buchanan for tasks assessed/performed       Past Medical History:  Diagnosis Date  . Arrhythmia    PALPITATIONS AND PVC - improved with cutting down on caffeine  . Cough variant asthma 05/28/2014   05/27/2014 p extensive coaching HFA effectiveness =    75% > try dulera 100 2bid  - PFT's 07/03/2014 FEV1 4.15 (98%) ratio 89 and no chang p B2 and nl fef25-75  And nl dlco    . Diverticulosis 07/2015   by CT scan  . GERD (gastroesophageal reflux disease)   . Hepatic steatosis   . Hypertension    h/o LVH, resolved  . Migraines   . Prostatitis 05/09/2013  . Seasonal allergies   . 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 Tremor   . Urticaria     Past Surgical History:  Procedure Laterality Date  . ESOPHAGOGASTRODUODENOSCOPY  08/2018   esophagus dilated, biopsy showed chronic gastritis with mod reflux changes [facility])  . NASAL RECONSTRUCTION WITH SEPTAL REPAIR  2017   [place]  . OTHER SURGICAL HISTORY  1992   facial surgery  . ROTATOR CUFF REPAIR  2009   left  . SINOSCOPY    . UMBILICAL HERNIA REPAIR  05/10/2012   Procedure: HERNIA REPAIR UMBILICAL ADULT;  Surgeon: Harl Bowie, MD;  Location: [facility];  Service: General;  Laterality: N/A;    There were no vitals filed for this visit.  Subjective Assessment - 01/31/19 1149    Subjective  "It hs been a good week" Pt reports that his foot is ~ 50% better from where he started. Pt stated that his elbow was fine while he was on vacation.    Currently in Pain?  Yes    Pain Score  4     Pain Location  Elbow    Pain Orientation  Left                       [facility] Adult PT Treatment/Exercise - 01/31/19 0001      Modalities   Modalities  Iontophoresis;Ultrasound      Ultrasound   Ultrasound Location  Left plantar fascis and L lateral elbow    Ultrasound Parameters  3.3Mhz 1.1 w/cm2    Ultrasound Goals  Edema;Pain      Iontophoresis   Type of Iontophoresis  Dexamethasone    Location  left lateral epicondyle & L p;lantar fascia    Dose  40m    Time  4 hour patch      Manual Therapy   Manual Therapy  Soft tissue mobilization;Taping    Manual therapy comments  tightness in both but more pain and tenderness in plantar fascia    Soft tissue mobilization  deep STW  to left lat elbow and left Plantar fascia    McConnell  left plantar fascia               PT Short Term Goals - 01/17/19 1223      PT SHORT TERM GOAL #1   Title  independent with initial HEP    Status  Achieved        PT Long Term Goals - 01/31/19 1232      PT LONG TERM GOAL #1   Title  understand proper posture and body mechanics    Status  Achieved      PT LONG TERM GOAL #2   Title  decrease pain 50%    Status  Partially Met      PT LONG TERM GOAL #4   Title  walk without limp    Status  Partially Met            Plan - 01/31/19 1236    Clinical Impression Statement  Pt reports improvement with his L foot reporting decrease pain. Some pain in lateral L elbow when at work. Pt reports no issues with elbow while he was on vacation. Positive response to all modalities and STM.    Rehab Potential  Good    PT Frequency  2x / week    PT Duration  8 weeks    PT Treatment/Interventions  ADLs/Self Care Home Management;Cryotherapy;Electrical  Stimulation;Iontophoresis 33m/ml Dexamethasone;Moist Heat;Ultrasound;Gait training;Stair training;Functional mobility training;Therapeutic activities;Therapeutic exercise;Patient/family education;Manual techniques;Dry needling;Taping;Vasopneumatic Device    PT Next Visit Plan  assess and progress       Patient will benefit from skilled therapeutic intervention in order to improve the following deficits and impairments:  Abnormal gait, Improper body mechanics, Pain, Increased muscle spasms, Decreased mobility, Decreased activity tolerance, Decreased range of motion, Decreased strength, Impaired flexibility, Difficulty walking  Visit Diagnosis: Pain in left elbow  Localized edema  Difficulty in walking, not elsewhere classified  Pain in left foot     Problem List Patient Active Problem List   Diagnosis Date Noted  . Left lateral epicondylitis 11/20/2018  . Health maintenance examination 09/08/2018  . Chest pain 07/04/2018  . Urticaria 04/24/2018  . Skin rash 04/24/2018  . Hordeolum internum of right upper eyelid 02/28/2018  . History of asthma 04/28/2017  . Family history of multiple sclerosis 04/27/2017  . Former smoker 04/27/2017  . Spondyloarthropathy   . OSA (obstructive sleep apnea) 08/19/2016  . Low back pain radiating to left lower extremity 08/06/2016  . Obesity, [name], BMI 30.0-34.9 (see actual BMI) 04/21/2016  . Cough, persistent 03/10/2016  . LLQ abdominal pain 07/31/2015  . Daytime somnolence 07/31/2015  . Cough variant asthma  05/28/2014  . Pruritic condition 04/18/2014  . Hepatic steatosis   . Migraines   . Perennial allergic rhinitis   . GERD (gastroesophageal reflux disease)   . Hypertension     Jordan Buchanan, 12:38 PM  Jordan Buchanan: [phone]  Fax:  [phone] Name: SJayvan McshanMRN: [id number] of Birth:  110/01/1971

## 2019-02-02 MED ORDER — PREDNISONE 20 MG PO TABS: ORAL_TABLET | ORAL | 0 refills | Status: DC

## 2019-02-07 ENCOUNTER — Ambulatory Visit: Payer: No Typology Code available for payment source | Admitting: Physical Therapy

## 2019-02-07 ENCOUNTER — Encounter: Payer: Self-pay | Admitting: Physical Therapy

## 2019-02-07 DIAGNOSIS — R262 Difficulty in walking, not elsewhere classified: Secondary | ICD-10-CM

## 2019-02-07 DIAGNOSIS — M25522 Pain in left elbow: Secondary | ICD-10-CM

## 2019-02-07 DIAGNOSIS — M79672 Pain in left foot: Secondary | ICD-10-CM

## 2019-02-07 DIAGNOSIS — R600 Localized edema: Secondary | ICD-10-CM

## 2019-02-07 NOTE — Therapy (Signed)
[facility] [place], [place], [address] Phone: [phone]   Fax:  [phone]  Physical Therapy Treatment  Patient Details  Name: Jordan Buchanan MRN: [id number] Date of Birth: 01-16-1971 Referring Provider (PT): Ferne Reus   Encounter Date: 02/07/2019  PT End of Session - 02/07/19 1222    Visit Number  6    Date for PT Re-Evaluation  03/05/19    PT Start Time  9528    PT Stop Time  1222    PT Time Calculation (min)  37 min    Activity Tolerance  Patient tolerated treatment well    Behavior During Therapy  [facility] for tasks assessed/performed       Past Medical History:  Diagnosis Date  . Arrhythmia    PALPITATIONS AND PVC - improved with cutting down on caffeine  . Cough variant asthma 05/28/2014   05/27/2014 p extensive coaching HFA effectiveness =    75% > try dulera 100 2bid  - PFT's 07/03/2014 FEV1 4.15 (98%) ratio 89 and no chang p B2 and nl fef25-75  And nl dlco    . Diverticulosis 07/2015   by CT scan  . GERD (gastroesophageal reflux disease)   . Hepatic steatosis   . Hypertension    h/o LVH, resolved  . Migraines   . Prostatitis 05/09/2013  . Seasonal allergies   . 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 Estanislado Pandy)  . Tremor   . Urticaria     Past Surgical History:  Procedure Laterality Date  . ESOPHAGOGASTRODUODENOSCOPY  08/2018   esophagus dilated, biopsy showed chronic gastritis with mod reflux changes [facility])  . NASAL RECONSTRUCTION WITH SEPTAL REPAIR  2017   [place]  . OTHER SURGICAL HISTORY  1992   facial surgery  . ROTATOR CUFF REPAIR  2009   left  . SINOSCOPY    . UMBILICAL HERNIA REPAIR  05/10/2012   Procedure: HERNIA REPAIR UMBILICAL ADULT;  Surgeon: Harl Bowie, MD;  Location: [facility];  Service: General;  Laterality: N/A;    There were no vitals filed for this visit.  Subjective Assessment - 02/07/19 1150     Subjective  "Foot is doing a lot better, elbow not so much, but better than it started"    Currently in Pain?  Yes    Pain Score  --   L foot 1/10, L lateral elbow 3/10                       [facility] Adult PT Treatment/Exercise - 02/07/19 0001      Modalities   Modalities  Iontophoresis;Ultrasound      Ultrasound   Ultrasound Location  L plantar fascis, L lateral elbow    Ultrasound Parameters  3.3Mhz 1.2w/cm2    Ultrasound Goals  Edema;Pain      Iontophoresis   Type of Iontophoresis  Dexamethasone    Location  left lateral epicondyle & L p;lantar fascia    Dose  7m    Time  4 hour patch               PT Short Term Goals - 01/17/19 1223      PT SHORT TERM GOAL #1   Title  independent with initial HEP    Status  Achieved        PT Long Term Goals - 02/07/19 1224      PT  LONG TERM GOAL #1   Title  understand proper posture and body mechanics    Status  Achieved      PT LONG TERM GOAL #2   Title  decrease pain 50%    Status  Partially Met      PT LONG TERM GOAL #3   Title  tolerate standing for an hour      PT LONG TERM GOAL #4   Title  walk without limp    Status  Achieved      PT LONG TERM GOAL #5   Title  increase left wrist extension strength to 4/5    Status  On-going            Plan - 02/07/19 1223    Clinical Impression Statement  Continued with modalities due to pt subjective reports of improvement. L foot has improved a lot, elbow improvement as well but not as much    Rehab Potential  Good    PT Frequency  2x / week    PT Duration  8 weeks    PT Treatment/Interventions  ADLs/Self Care Home Management;Cryotherapy;Electrical Stimulation;Iontophoresis 31m/ml Dexamethasone;Moist Heat;Ultrasound;Gait training;Stair training;Functional mobility training;Therapeutic activities;Therapeutic exercise;Patient/family education;Manual techniques;Dry needling;Taping;Vasopneumatic Device    PT Next Visit Plan  assess and progress        Patient will benefit from skilled therapeutic intervention in order to improve the following deficits and impairments:  Abnormal gait, Improper body mechanics, Pain, Increased muscle spasms, Decreased mobility, Decreased activity tolerance, Decreased range of motion, Decreased strength, Impaired flexibility, Difficulty walking  Visit Diagnosis: Localized edema  Pain in left elbow  Difficulty in walking, not elsewhere classified  Pain in left foot     Problem List Patient Active Problem List   Diagnosis Date Noted  . Left lateral epicondylitis 11/20/2018  . Health maintenance examination 09/08/2018  . Chest pain 07/04/2018  . Urticaria 04/24/2018  . Skin rash 04/24/2018  . Hordeolum internum of right upper eyelid 02/28/2018  . History of asthma 04/28/2017  . Family history of multiple sclerosis 04/27/2017  . Former smoker 04/27/2017  . Spondyloarthropathy   . OSA (obstructive sleep apnea) 08/19/2016  . Low back pain radiating to left lower extremity 08/06/2016  . Obesity, [name], BMI 30.0-34.9 (see actual BMI) 04/21/2016  . Cough, persistent 03/10/2016  . LLQ abdominal pain 07/31/2015  . Daytime somnolence 07/31/2015  . Cough variant asthma  05/28/2014  . Pruritic condition 04/18/2014  . Hepatic steatosis   . Migraines   . Perennial allergic rhinitis   . GERD (gastroesophageal reflux disease)   . Hypertension     RScot Jun PTA 02/07/2019, 12:25 PM  [place] [address]Phone: [phone]  Fax:  [phone] Name: Jordan OsunaMRN: [id number] of Birth: 105/20/1972

## 2019-02-14 ENCOUNTER — Ambulatory Visit: Payer: PRIVATE HEALTH INSURANCE | Admitting: Podiatry

## 2019-02-14 ENCOUNTER — Encounter: Payer: Self-pay | Admitting: Podiatry

## 2019-02-14 DIAGNOSIS — M722 Plantar fascial fibromatosis: Secondary | ICD-10-CM

## 2019-02-18 NOTE — Progress Notes (Signed)
   Subjective: 48 year old male presenting today for follow up evaluation of plantar fasciitis of the left foot. He states he is doing better. He reports only mild intermittent tenderness. He is interested in getting orthotics at this time. He has been doing physical therapy for treatment. There are no worsening factors noted. Patient is here for further evaluation and treatment.   Past Medical History:  Diagnosis Date  . Arrhythmia    PALPITATIONS AND PVC - improved with cutting down on caffeine  . Cough variant asthma 05/28/2014   05/27/2014 p extensive coaching HFA effectiveness =    75% > try dulera 100 2bid  - PFT's 07/03/2014 FEV1 4.15 (98%) ratio 89 and no chang p B2 and nl fef25-75  And nl dlco    . Diverticulosis 07/2015   by CT scan  . GERD (gastroesophageal reflux disease)   . Hepatic steatosis   . Hypertension    h/o LVH, resolved  . Migraines   . Prostatitis 05/09/2013  . Seasonal allergies   . 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 Estanislado Pandy)  . Tremor   . Urticaria      Objective: Physical Exam General: The patient is alert and oriented x3 in no acute distress.  Dermatology: Skin is warm, dry and supple bilateral lower extremities. Negative for open lesions or macerations bilateral.   Vascular: Dorsalis Pedis and Posterior Tibial pulses palpable bilateral.  Capillary fill time is immediate to all digits.  Neurological: Epicritic and protective threshold intact bilateral.   Musculoskeletal: Tenderness to palpation to the plantar aspect of the left heel along the plantar fascia. All other joints range of motion within normal limits bilateral. Strength 5/5 in all groups bilateral.   Assessment: 1. Plantar fasciitis left foot - improved   Plan of Care:  1. Patient evaluated.  2. Continue physical therapy.  3. Appointment with Liliane Channel, Pedorthist, for custom molded orthotics.  4. Cannot tolerate NSAIDs due to GI problems.  5. Discussed EPAT  vs surgery. Continue conservative treatment at this time.   6. Return to clinic in 6 weeks.   Works for [employer]   Edrick Kins, DPM [facility]  Dr. Edrick Kins, DPM    [address], [place] [address]                Office [phone]  Fax [phone]

## 2019-03-02 ENCOUNTER — Other Ambulatory Visit: Payer: Self-pay

## 2019-03-02 ENCOUNTER — Other Ambulatory Visit: Payer: PRIVATE HEALTH INSURANCE | Admitting: Orthotics

## 2019-03-02 DIAGNOSIS — M722 Plantar fascial fibromatosis: Secondary | ICD-10-CM | POA: Diagnosis not present

## 2019-03-28 ENCOUNTER — Ambulatory Visit: Payer: PRIVATE HEALTH INSURANCE | Admitting: Podiatry

## 2019-04-09 ENCOUNTER — Ambulatory Visit: Payer: PRIVATE HEALTH INSURANCE | Admitting: Orthotics

## 2019-04-09 ENCOUNTER — Other Ambulatory Visit: Payer: Self-pay

## 2019-04-09 VITALS — Temp 97.5°F

## 2019-04-09 DIAGNOSIS — M722 Plantar fascial fibromatosis: Secondary | ICD-10-CM

## 2019-04-09 NOTE — Progress Notes (Signed)
Patient came in today to pick up custom made foot orthotics.  The goals were accomplished and the patient reported no dissatisfaction with said orthotics.  Patient was advised of breakin period and how to report any issues. 

## 2019-04-23 ENCOUNTER — Other Ambulatory Visit: Payer: Self-pay | Admitting: Family Medicine

## 2019-06-18 ENCOUNTER — Emergency Department (HOSPITAL_BASED_OUTPATIENT_CLINIC_OR_DEPARTMENT_OTHER)
Admission: EM | Admit: 2019-06-18 | Discharge: 2019-06-18 | Disposition: A | Discharge status: Home / Self Care | Payer: No Typology Code available for payment source | Attending: Emergency Medicine | Admitting: Emergency Medicine

## 2019-06-18 ENCOUNTER — Encounter (HOSPITAL_BASED_OUTPATIENT_CLINIC_OR_DEPARTMENT_OTHER): Payer: Self-pay | Admitting: Emergency Medicine

## 2019-06-18 ENCOUNTER — Emergency Department (HOSPITAL_BASED_OUTPATIENT_CLINIC_OR_DEPARTMENT_OTHER): Payer: No Typology Code available for payment source

## 2019-06-18 ENCOUNTER — Other Ambulatory Visit: Payer: Self-pay

## 2019-06-18 DIAGNOSIS — R079 Chest pain, unspecified: Secondary | ICD-10-CM | POA: Insufficient documentation

## 2019-06-18 DIAGNOSIS — Z79899 Other long term (current) drug therapy: Secondary | ICD-10-CM | POA: Insufficient documentation

## 2019-06-18 DIAGNOSIS — I10 Essential (primary) hypertension: Secondary | ICD-10-CM | POA: Insufficient documentation

## 2019-06-18 DIAGNOSIS — Z87891 Personal history of nicotine dependence: Secondary | ICD-10-CM | POA: Insufficient documentation

## 2019-06-18 LAB — COMPREHENSIVE METABOLIC PANEL
ALT: 30 U/L (ref 0–44)
AST: 21 U/L (ref 15–41)
Albumin: 4 g/dL (ref 3.5–5.0)
Alkaline Phosphatase: 69 U/L (ref 38–126)
Anion gap: 8 (ref 5–15)
BUN: 9 mg/dL (ref 6–20)
CO2: 26 mmol/L (ref 22–32)
Calcium: 8.8 mg/dL — ABNORMAL LOW (ref 8.9–10.3)
Chloride: 105 mmol/L (ref 98–111)
Creatinine, Ser: 0.96 mg/dL (ref 0.61–1.24)
GFR calc Af Amer: >60 mL/min (ref >60)
GFR calc non Af Amer: >60 mL/min (ref >60)
Glucose, Bld: 100 mg/dL — ABNORMAL HIGH (ref 70–99)
Potassium: 3.9 mmol/L (ref 3.5–5.1)
Sodium: 139 mmol/L (ref 135–145)
Total Bilirubin: 0.6 mg/dL (ref 0.3–1.2)
Total Protein: 6.7 g/dL (ref 6.5–8.1)

## 2019-06-18 LAB — CBC WITH DIFFERENTIAL/PLATELET
Abs Immature Granulocytes: 0.02 10*3/uL (ref 0.00–0.07)
Basophils Absolute: 0.1 10*3/uL (ref 0.0–0.1)
Basophils Relative: 1 %
Eosinophils Absolute: 0.3 10*3/uL (ref 0.0–0.5)
Eosinophils Relative: 3 %
HCT: 46.1 % (ref 39.0–52.0)
Hemoglobin: 15.9 g/dL (ref 13.0–17.0)
Immature Granulocytes: 0 %
Lymphocytes Relative: 35 %
Lymphs Abs: 2.9 10*3/uL (ref 0.7–4.0)
MCH: 30.2 pg (ref 26.0–34.0)
MCHC: 34.5 g/dL (ref 30.0–36.0)
MCV: 87.6 fL (ref 80.0–100.0)
Monocytes Absolute: 0.8 10*3/uL (ref 0.1–1.0)
Monocytes Relative: 10 %
Neutro Abs: 4.3 10*3/uL (ref 1.7–7.7)
Neutrophils Relative %: 51 %
Platelets: 271 10*3/uL (ref 150–400)
RBC: 5.26 MIL/uL (ref 4.22–5.81)
RDW: 12.3 % (ref 11.5–15.5)
WBC: 8.3 10*3/uL (ref 4.0–10.5)
nRBC: 0 % (ref 0.0–0.2)

## 2019-06-18 LAB — LIPASE, BLOOD: Lipase: 35 U/L (ref 11–51)

## 2019-06-18 LAB — TROPONIN I (HIGH SENSITIVITY): Troponin I (High Sensitivity): 3 ng/L (ref <18)

## 2019-06-18 LAB — D-DIMER, QUANTITATIVE: D-Dimer, Quant: <0.27 ug/mL-FEU (ref 0.00–0.50)

## 2019-06-18 MED ORDER — LIDOCAINE VISCOUS HCL 2 % MT SOLN
15.0000 mL | Freq: Once | OROMUCOSAL | Status: AC
  Administered 2019-06-18: 15 mL via ORAL
  Filled 2019-06-18: qty 15

## 2019-06-18 MED ORDER — ALUM & MAG HYDROXIDE-SIMETH 200-200-20 MG/5ML PO SUSP
30.0000 mL | Freq: Once | ORAL | Status: AC
  Administered 2019-06-18: 02:00:00 30 mL via ORAL
  Filled 2019-06-18: qty 30

## 2019-06-18 NOTE — ED Notes (Signed)
Report received 

## 2019-06-18 NOTE — ED Provider Notes (Signed)
[facility] EMERGENCY DEPARTMENT Provider Note   CSN: [id number] Arrival date & time: 06/18/19  0202    History   Chief Complaint Chief Complaint  Patient presents with  . Chest Pain    HPI Jordan Buchanan is a 48 y.o. male.  HPI: A 48 year old patient with a history of hypertension presents for evaluation of chest pain. Initial onset of pain was approximately 3-6 hours ago. The patient's chest pain is sharp and is not worse with exertion. The patient's chest pain is middle- or left-sided, is not well-localized, is not described as heaviness/pressure/tightness and does radiate to the arms/jaw/neck. The patient does not complain of nausea and denies diaphoresis. The patient has no history of stroke, has no history of peripheral artery disease, has not smoked in the past 90 days, denies any history of treated diabetes, has no relevant family history of coronary artery disease (first degree relative at less than age 70), has no history of hypercholesterolemia and does not have an elevated BMI (>=30).   Patient presents to the emergency department for evaluation of chest pain.  Patient reports that symptoms began this evening.  He was riding in the car when the pain began, not exerting himself.  He has a sharp component to the left chest but an aching pain across to the left throat and posterior scalp area.  No pain or radiation to the arm.  No numbness, tingling or weakness in extremities.  Patient does not feel short of breath but the pain does worsen when he breathes.     Past Medical History:  Diagnosis Date  . Arrhythmia    PALPITATIONS AND PVC - improved with cutting down on caffeine  . Cough variant asthma 05/28/2014   05/27/2014 p extensive coaching HFA effectiveness =    75% > try dulera 100 2bid  - PFT's 07/03/2014 FEV1 4.15 (98%) ratio 89 and no chang p B2 and nl fef25-75  And nl dlco    . Diverticulosis 07/2015   by CT scan  . GERD (gastroesophageal reflux disease)   .  Hepatic steatosis   . Hypertension    h/o LVH, resolved  . Migraines   . Prostatitis 05/09/2013  . Seasonal allergies   . 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 Estanislado Pandy)  . Tremor   . Urticaria     Patient Active Problem List   Diagnosis Date Noted  . Left lateral epicondylitis 11/20/2018  . Health maintenance examination 09/08/2018  . Chest pain 07/04/2018  . Urticaria 04/24/2018  . Skin rash 04/24/2018  . Hordeolum internum of right upper eyelid 02/28/2018  . History of asthma 04/28/2017  . Family history of multiple sclerosis 04/27/2017  . Former smoker 04/27/2017  . Spondyloarthropathy   . OSA (obstructive sleep apnea) 08/19/2016  . Low back pain radiating to left lower extremity 08/06/2016  . Obesity, [name], BMI 30.0-34.9 (see actual BMI) 04/21/2016  . Cough, persistent 03/10/2016  . LLQ abdominal pain 07/31/2015  . Daytime somnolence 07/31/2015  . Cough variant asthma  05/28/2014  . Pruritic condition 04/18/2014  . Hepatic steatosis   . Migraines   . Perennial allergic rhinitis   . GERD (gastroesophageal reflux disease)   . Hypertension     Past Surgical History:  Procedure Laterality Date  . ESOPHAGOGASTRODUODENOSCOPY  08/2018   esophagus dilated, biopsy showed chronic gastritis with mod reflux changes [facility])  . NASAL RECONSTRUCTION WITH SEPTAL REPAIR  2017   [place]  .  OTHER SURGICAL HISTORY  1992   facial surgery  . ROTATOR CUFF REPAIR  2009   left  . SINOSCOPY    . UMBILICAL HERNIA REPAIR  05/10/2012   Procedure: HERNIA REPAIR UMBILICAL ADULT;  Surgeon: Harl Bowie, MD;  Location: [facility];  Service: General;  Laterality: N/A;        Home Medications    Prior to Admission medications   Medication Sig Start Date End Date Taking? Authorizing Provider  amLODipine (NORVASC) 10 MG tablet TAKE 1 TABLET BY MOUTH EVERY DAY 01/08/19  Yes Ria Bush, MD  cetirizine (ZYRTEC) 10 MG tablet Take  10 mg by mouth at bedtime. As needed for seasonal allergies   Yes [provider]  esomeprazole (NEXIUM) 40 MG capsule TAKE 1 CAPSULE BY MOUTH EVERY DAY BY MOUTH 04/25/19  Yes Ria Bush, MD  metoprolol succinate (TOPROL-XL) 50 MG 24 hr tablet TAKE 1 TABLET BY MOUTH EVERY DAY 12/01/18  Yes Ria Bush, MD  albuterol (VENTOLIN HFA) 108 (90 Base) MCG/ACT inhaler Inhale 2 puffs into the lungs every 4 (four) hours as needed for wheezing or shortness of breath. 03/30/17   Parrett, Fonnie Mu, NP  diclofenac sodium (VOLTAREN) 1 % GEL Apply 2 g topically 4 (four) times daily. 12/25/18   Copland, Frederico Hamman, MD  methylPREDNISolone (MEDROL DOSEPAK) 4 MG TBPK tablet 6 day dose pack - take as directed 01/03/19   Edrick Kins, DPM  predniSONE (DELTASONE) 20 MG tablet Take two tablets daily for 3 days followed by one tablet daily for 4 days 02/02/19   Ria Bush, MD    Family History Family History  Problem Relation Age of Onset  . Asthma Father   . Arthritis Father        father, brother, Pgrandfather  . Chronic bronchitis Father   . Allergies Father   . Hypertension Brother        obese  . CAD Paternal Grandfather        MI x2  . Multiple sclerosis Brother   . Stroke Neg Hx   . Cancer Neg Hx     Social History Social History   Tobacco Use  . Smoking status: Former Smoker    Packs/day: 1.00    Years: 13.00    Pack years: 13.00    Types: Cigarettes    Quit date: 12/21/1999    Years since quitting: 19.5  . Smokeless tobacco: Former Systems developer    Types: Snuff    Quit date: 12/20/2013  Substance Use Topics  . Alcohol use: No    Alcohol/week: 0.0 standard drinks    Comment: quit 2002  . Drug use: Not Currently    Types: Marijuana    Comment: quit 2002     Allergies   Patient has no known allergies.   Review of Systems Review of Systems  Cardiovascular: Positive for chest pain.  All other systems reviewed and are negative.    Physical Exam Updated Vital Signs BP  120/81   Pulse 63   Temp 98.1 F (36.7 C) (Oral)   Resp 17   Ht 5' 9.75" (1.772 m)   Wt 110 kg   SpO2 98%   BMI 35.05 kg/m   Physical Exam Vitals signs and nursing note reviewed.  Constitutional:      General: He is not in acute distress.    Appearance: Normal appearance. He is well-developed.  HENT:     Head: Normocephalic and atraumatic.     Right Ear:  Hearing normal.     Left Ear: Hearing normal.     Nose: Nose normal.  Eyes:     Conjunctiva/sclera: Conjunctivae normal.     Pupils: Pupils are equal, round, and reactive to light.  Neck:     Musculoskeletal: Normal range of motion and neck supple.  Cardiovascular:     Rate and Rhythm: Regular rhythm.     Heart sounds: S1 normal and S2 normal. No murmur. No friction rub. No gallop.   Pulmonary:     Effort: Pulmonary effort is normal. No respiratory distress.     Breath sounds: Normal breath sounds.  Chest:     Chest wall: No tenderness.  Abdominal:     General: Bowel sounds are normal.     Palpations: Abdomen is soft.     Tenderness: There is no abdominal tenderness. There is no guarding or rebound. Negative signs include Murphy's sign and McBurney's sign.     Hernia: No hernia is present.  Musculoskeletal: Normal range of motion.  Skin:    General: Skin is warm and dry.     Findings: No rash.  Neurological:     Mental Status: He is alert and oriented to person, place, and time.     GCS: GCS eye subscore is 4. GCS verbal subscore is 5. GCS motor subscore is 6.     Cranial Nerves: No cranial nerve deficit.     Sensory: No sensory deficit.     Coordination: Coordination normal.  Psychiatric:        Speech: Speech normal.        Behavior: Behavior normal.        Thought Content: Thought content normal.      ED Treatments / Results  Labs (all labs ordered are listed, but only abnormal results are displayed) Labs Reviewed  COMPREHENSIVE METABOLIC PANEL - Abnormal; Notable for the following components:       Result Value   Glucose, Bld 100 (*)    Calcium 8.8 (*)    All other components within normal limits  CBC WITH DIFFERENTIAL/PLATELET  LIPASE, BLOOD  TROPONIN I (HIGH SENSITIVITY)  D-DIMER, QUANTITATIVE (NOT AT [facility])  TROPONIN I (HIGH SENSITIVITY)    EKG EKG Interpretation  Date/Time:  Monday June 18 2019 02:08:27 EDT Ventricular Rate:  68 PR Interval:    QRS Duration: 89 QT Interval:  390 QTC Calculation: 415 R Axis:   62 Text Interpretation:  Sinus rhythm Normal ECG Confirmed by Orpah Greek ([id number]) on 06/18/2019 2:18:42 AM   Radiology Dg Chest 2 View  Result Date: 06/18/2019 CLINICAL DATA:  48 y/o  M; chest pain. EXAM: CHEST - 2 VIEW COMPARISON:  06/04/2018 chest radiograph FINDINGS: Stable heart size and mediastinal contours are within normal limits. Both lungs are clear. The visualized skeletal structures are unremarkable. IMPRESSION: No active cardiopulmonary disease. Electronically Signed   By: Kristine Garbe M.D.   On: 06/18/2019 03:15    Procedures Procedures (including critical care time)  Medications Ordered in ED Medications  alum & mag hydroxide-simeth (MAALOX/MYLANTA) 200-200-20 MG/5ML suspension 30 mL (30 mLs Oral Given 06/18/19 0222)    And  lidocaine (XYLOCAINE) 2 % viscous mouth solution 15 mL (15 mLs Oral Given 06/18/19 0222)     Initial Impression / Assessment and Plan / ED Course  I have reviewed the triage vital signs and the nursing notes.  Pertinent labs & imaging results that were available during my care of the patient were reviewed by me and considered  in my medical decision making (see chart for details).     HEAR Score: 1  Patient presents to the emergency department for evaluation of chest pain.  Patient complaining of a sharp pain in the left chest, related to taking deep breaths.  Pain radiates to the left side of the neck area but also beyond that behind the ear to the scalp.  He does not have any neck pain.  No  weakness, numbness or altered sensation of upper extremities.  Patient has minimal cardiac risk factors.  Hear score is 1.  Patient underwent cardiac evaluation.  EKG is normal.  High-sensitivity troponin  is 3.  Based on our current cardiac pathway model, patient does not require further work-up.  Will have follow-up with his PCP/GI doctor because of his extensive GI history.  No sign of bleeding or GI emergency at this time.  We will also refer to cardiology for consideration of outpatient stress testing if symptoms continue.  Patient counseled to return for any new or worsening symptoms.  Final Clinical Impressions(s) / ED Diagnoses   Final diagnoses:  Nonspecific chest pain    ED Discharge Orders    None       Ryman Rathgeber, Gwenyth Allegra, MD 06/18/19 [phone]

## 2019-06-18 NOTE — ED Notes (Signed)
Taken to xray at this time. 

## 2019-06-18 NOTE — ED Triage Notes (Signed)
Reports sharp left sided chest pain that radiates into the left side of the neck that started this evening.  Does report having 2-3 loose stools in the last hour.

## 2019-06-21 ENCOUNTER — Encounter (HOSPITAL_COMMUNITY): Payer: Self-pay | Admitting: Emergency Medicine

## 2019-06-21 ENCOUNTER — Telehealth: Payer: Self-pay

## 2019-06-21 ENCOUNTER — Other Ambulatory Visit: Payer: Self-pay | Admitting: Cardiology

## 2019-06-21 ENCOUNTER — Emergency Department (HOSPITAL_COMMUNITY)
Admission: EM | Admit: 2019-06-21 | Discharge: 2019-06-21 | Disposition: A | Discharge status: Home / Self Care | Payer: BC Managed Care – PPO | Attending: Emergency Medicine | Admitting: Emergency Medicine

## 2019-06-21 ENCOUNTER — Emergency Department (HOSPITAL_COMMUNITY): Payer: BC Managed Care – PPO

## 2019-06-21 DIAGNOSIS — R079 Chest pain, unspecified: Secondary | ICD-10-CM

## 2019-06-21 DIAGNOSIS — Z87891 Personal history of nicotine dependence: Secondary | ICD-10-CM | POA: Insufficient documentation

## 2019-06-21 DIAGNOSIS — Z79899 Other long term (current) drug therapy: Secondary | ICD-10-CM | POA: Diagnosis not present

## 2019-06-21 DIAGNOSIS — R197 Diarrhea, unspecified: Secondary | ICD-10-CM | POA: Diagnosis not present

## 2019-06-21 DIAGNOSIS — I10 Essential (primary) hypertension: Secondary | ICD-10-CM

## 2019-06-21 DIAGNOSIS — M542 Cervicalgia: Secondary | ICD-10-CM | POA: Diagnosis not present

## 2019-06-21 DIAGNOSIS — Z20828 Contact with and (suspected) exposure to other viral communicable diseases: Secondary | ICD-10-CM | POA: Diagnosis not present

## 2019-06-21 DIAGNOSIS — R0789 Other chest pain: Secondary | ICD-10-CM

## 2019-06-21 LAB — COMPREHENSIVE METABOLIC PANEL
ALT: 33 U/L (ref 0–44)
AST: 34 U/L (ref 15–41)
Albumin: 4.1 g/dL (ref 3.5–5.0)
Alkaline Phosphatase: 66 U/L (ref 38–126)
Anion gap: 10 (ref 5–15)
BUN: 6 mg/dL (ref 6–20)
CO2: 22 mmol/L (ref 22–32)
Calcium: 8.9 mg/dL (ref 8.9–10.3)
Chloride: 106 mmol/L (ref 98–111)
Creatinine, Ser: 1 mg/dL (ref 0.61–1.24)
GFR calc Af Amer: >60 mL/min (ref >60)
GFR calc non Af Amer: >60 mL/min (ref >60)
Glucose, Bld: 102 mg/dL — ABNORMAL HIGH (ref 70–99)
Potassium: 4.1 mmol/L (ref 3.5–5.1)
Sodium: 138 mmol/L (ref 135–145)
Total Bilirubin: 1.2 mg/dL (ref 0.3–1.2)
Total Protein: 6.7 g/dL (ref 6.5–8.1)

## 2019-06-21 LAB — TROPONIN I (HIGH SENSITIVITY)
Troponin I (High Sensitivity): 3 ng/L (ref <18)
Troponin I (High Sensitivity): <2 ng/L (ref <18)

## 2019-06-21 LAB — CBC WITH DIFFERENTIAL/PLATELET
Abs Immature Granulocytes: 0.02 10*3/uL (ref 0.00–0.07)
Basophils Absolute: 0 10*3/uL (ref 0.0–0.1)
Basophils Relative: 1 %
Eosinophils Absolute: 0.1 10*3/uL (ref 0.0–0.5)
Eosinophils Relative: 1 %
HCT: 44.6 % (ref 39.0–52.0)
Hemoglobin: 15.6 g/dL (ref 13.0–17.0)
Immature Granulocytes: 0 %
Lymphocytes Relative: 26 %
Lymphs Abs: 2 10*3/uL (ref 0.7–4.0)
MCH: 30.2 pg (ref 26.0–34.0)
MCHC: 35 g/dL (ref 30.0–36.0)
MCV: 86.4 fL (ref 80.0–100.0)
Monocytes Absolute: 0.6 10*3/uL (ref 0.1–1.0)
Monocytes Relative: 8 %
Neutro Abs: 5 10*3/uL (ref 1.7–7.7)
Neutrophils Relative %: 64 %
Platelets: 269 10*3/uL (ref 150–400)
RBC: 5.16 MIL/uL (ref 4.22–5.81)
RDW: 12.5 % (ref 11.5–15.5)
WBC: 7.8 10*3/uL (ref 4.0–10.5)
nRBC: 0 % (ref 0.0–0.2)

## 2019-06-21 LAB — SARS CORONAVIRUS 2 BY RT PCR (HOSPITAL ORDER, PERFORMED IN ~~LOC~~ HOSPITAL LAB): SARS Coronavirus 2: NEGATIVE

## 2019-06-21 NOTE — ED Provider Notes (Signed)
[facility] EMERGENCY DEPARTMENT Provider Note   CSN: [id number] Arrival date & time: 06/21/19  7893     History   Chief Complaint Chief Complaint  Patient presents with  . Hypertension    HPI Jordan Buchanan is a 48 y.o. male.     48 year old male presents with substernal chest discomfort associate with hypertension, diaphoresis, dyspnea.  States he was at work today and became upset and then developed the symptoms.  Was seen here 3 days ago for similar symptoms and had a negative work-up at that time.  Denies any prior cardiac history but does have a history of hypertension.  Patient took his normal blood pressure medications this morning.  States that symptoms have gradually improved.  Has been dealing with GERD for several weeks and states that that has been stable.  Denies any black or bloody stools.  No recent fever, cough, congestion.  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  Patient sent here for further management     Past Medical History:  Diagnosis Date  . Arrhythmia    PALPITATIONS AND PVC - improved with cutting down on caffeine  . Cough variant asthma 05/28/2014   05/27/2014 p extensive coaching HFA effectiveness =    75% > try dulera 100 2bid  - PFT's 07/03/2014 FEV1 4.15 (98%) ratio 89 and no chang p B2 and nl fef25-75  And nl dlco    . Diverticulosis 07/2015   by CT scan  . GERD (gastroesophageal reflux disease)   . Hepatic steatosis   . Hypertension    h/o LVH, resolved  . Migraines   . Prostatitis 05/09/2013  . Seasonal allergies   . 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 Estanislado Pandy)  . Tremor   . Urticaria     Patient Active Problem List   Diagnosis Date Noted  . Left lateral epicondylitis 11/20/2018  . Health maintenance examination 09/08/2018  . Chest pain 07/04/2018  . Urticaria 04/24/2018  . Skin rash 04/24/2018  . Hordeolum internum of right upper eyelid 02/28/2018  . History of asthma  04/28/2017  . Family history of multiple sclerosis 04/27/2017  . Former smoker 04/27/2017  . Spondyloarthropathy   . OSA (obstructive sleep apnea) 08/19/2016  . Low back pain radiating to left lower extremity 08/06/2016  . Obesity, [name], BMI 30.0-34.9 (see actual BMI) 04/21/2016  . Cough, persistent 03/10/2016  . LLQ abdominal pain 07/31/2015  . Daytime somnolence 07/31/2015  . Cough variant asthma  05/28/2014  . Pruritic condition 04/18/2014  . Hepatic steatosis   . Migraines   . Perennial allergic rhinitis   . GERD (gastroesophageal reflux disease)   . Hypertension     Past Surgical History:  Procedure Laterality Date  . ESOPHAGOGASTRODUODENOSCOPY  08/2018   esophagus dilated, biopsy showed chronic gastritis with mod reflux changes [facility])  . NASAL RECONSTRUCTION WITH SEPTAL REPAIR  2017   [place]  . OTHER SURGICAL HISTORY  1992   facial surgery  . ROTATOR CUFF REPAIR  2009   left  . SINOSCOPY    . UMBILICAL HERNIA REPAIR  05/10/2012   Procedure: HERNIA REPAIR UMBILICAL ADULT;  Surgeon: Harl Bowie, MD;  Location: [facility] [facility];  Service: General;  Laterality: N/A;        Home Medications    Prior to Admission medications   Medication Sig Start Date End Date Taking? Authorizing Provider  albuterol (VENTOLIN HFA) 108 (90 Base)  MCG/ACT inhaler Inhale 2 puffs into the lungs every 4 (four) hours as needed for wheezing or shortness of breath. 03/30/17   Parrett, Fonnie Mu, NP  amLODipine (NORVASC) 10 MG tablet TAKE 1 TABLET BY MOUTH EVERY DAY 01/08/19   Ria Bush, MD  cetirizine (ZYRTEC) 10 MG tablet Take 10 mg by mouth at bedtime. As needed for seasonal allergies    [provider]  diclofenac sodium (VOLTAREN) 1 % GEL Apply 2 g topically 4 (four) times daily. 12/25/18   Copland, Frederico Hamman, MD  esomeprazole (NEXIUM) 40 MG capsule TAKE 1 CAPSULE BY MOUTH EVERY DAY BY MOUTH 04/25/19   Ria Bush, MD  methylPREDNISolone (MEDROL DOSEPAK) 4  MG TBPK tablet 6 day dose pack - take as directed 01/03/19   Edrick Kins, DPM  metoprolol succinate (TOPROL-XL) 50 MG 24 hr tablet TAKE 1 TABLET BY MOUTH EVERY DAY 12/01/18   Ria Bush, MD  predniSONE (DELTASONE) 20 MG tablet Take two tablets daily for 3 days followed by one tablet daily for 4 days 02/02/19   Ria Bush, MD    Family History Family History  Problem Relation Age of Onset  . Asthma Father   . Arthritis Father        father, brother, Pgrandfather  . Chronic bronchitis Father   . Allergies Father   . Hypertension Brother        obese  . CAD Paternal Grandfather        MI x2  . Multiple sclerosis Brother   . Stroke Neg Hx   . Cancer Neg Hx     Social History Social History   Tobacco Use  . Smoking status: Former Smoker    Packs/day: 1.00    Years: 13.00    Pack years: 13.00    Types: Cigarettes    Quit date: 12/21/1999    Years since quitting: 19.5  . Smokeless tobacco: Former Systems developer    Types: Snuff    Quit date: 12/20/2013  Substance Use Topics  . Alcohol use: No    Alcohol/week: 0.0 standard drinks    Comment: quit 2002  . Drug use: Not Currently    Types: Marijuana    Comment: quit 2002     Allergies   Patient has no known allergies.   Review of Systems Review of Systems  All other systems reviewed and are negative.    Physical Exam Updated Vital Signs BP 131/81 (BP Location: Right Arm)   Pulse 65   Temp 98.2 F (36.8 C) (Oral)   SpO2 97%   Physical Exam Vitals signs and nursing note reviewed.  Constitutional:      General: He is not in acute distress.    Appearance: Normal appearance. He is well-developed. He is not toxic-appearing.  HENT:     Head: Normocephalic and atraumatic.  Eyes:     General: Lids are normal.     Conjunctiva/sclera: Conjunctivae normal.     Pupils: Pupils are equal, round, and reactive to light.  Neck:     Musculoskeletal: Normal range of motion and neck supple.     Thyroid: No thyroid mass.      Trachea: No tracheal deviation.  Cardiovascular:     Rate and Rhythm: Normal rate and regular rhythm.     Heart sounds: Normal heart sounds. No murmur. No gallop.   Pulmonary:     Effort: Pulmonary effort is normal. No respiratory distress.     Breath sounds: Normal breath sounds. No stridor. No decreased  breath sounds, wheezing, rhonchi or rales.  Abdominal:     General: Bowel sounds are normal. There is no distension.     Palpations: Abdomen is soft.     Tenderness: There is no abdominal tenderness. There is no rebound.  Musculoskeletal: Normal range of motion.        General: No tenderness.  Skin:    General: Skin is warm and dry.     Findings: No abrasion or rash.  Neurological:     Mental Status: He is alert and oriented to person, place, and time.     GCS: GCS eye subscore is 4. GCS verbal subscore is 5. GCS motor subscore is 6.     Cranial Nerves: No cranial nerve deficit.     Sensory: No sensory deficit.  Psychiatric:        Speech: Speech normal.        Behavior: Behavior normal.      ED Treatments / Results  Labs (all labs ordered are listed, but only abnormal results are displayed) Labs Reviewed - No data to display  EKG EKG Interpretation  Date/Time:  Thursday June 21 2019 09:54:23 EDT Ventricular Rate:  63 PR Interval:    QRS Duration: 92 QT Interval:  396 QTC Calculation: 406 R Axis:   70 Text Interpretation:  Sinus rhythm No significant change since last tracing Confirmed by Lacretia Leigh ([id number]) on 06/21/2019 10:06:55 AM   Radiology No results found.  Procedures Procedures (including critical care time)  Medications Ordered in ED Medications - No data to display   Initial Impression / Assessment and Plan / ED Course  I have reviewed the triage vital signs and the nursing notes.  Pertinent labs & imaging results that were available during my care of the patient were reviewed by me and considered in my medical decision making (see chart for  details).        Patient with troponin x2- here.  Has been seen by cardiology as well who has cleared the patient for discharge with outpatient follow-up.  Final Clinical Impressions(s) / ED Diagnoses   Final diagnoses:  Chest pain    ED Discharge Orders    None       Lacretia Leigh, MD 06/21/19 1428

## 2019-06-21 NOTE — ED Triage Notes (Signed)
Pt had episode where "he didn't feel right" arms tingling and hypertensive and heart racing. States he had similar episode Sunday where he also had those symptoms and chest pain and diaphoresis. EKG done and was not having STEMI. He also reports really bad heartburn starting last night. Took meds OTC for it and did not help

## 2019-06-21 NOTE — Discharge Instructions (Signed)
Our office will call for date and time of cardiac CTA you should hear from them on Monday, call after 12 if you have not heard from them.   Decrease salt in your diet.

## 2019-06-21 NOTE — ED Notes (Signed)
Pt is sinus bradycardia on monitor

## 2019-06-21 NOTE — Consult Note (Addendum)
Cardiology Consultation:   Patient ID: Jordan Buchanan MRN: [id number]; DOB: [birth year]  Admit date: 06/21/2019 Date of Consult: 06/21/2019  Primary Care Provider: Ria Bush, MD Primary Cardiologist: Peter Martinique, MD  Primary Electrophysiologist:  None   Patient Profile:   Jordan Buchanan is a 48 y.o. male with a hx of HTN,  Mild LVH that resolved and stopped tobacco in 2006 who is being seen today for the evaluation of chest pain at the request of Dr. Zenia Resides.  History of Present Illness:   Jordan Buchanan last seen by Dr. Martinique 2012 had been pt of Dr. Doreatha Lew, and was followed for HTN and LVH.  On Echo mild MR otherwise normal.  Hx of palpitations and PVC.    Pt seen in ER 06/18/19 with sharp Lt sided chest pain with radiation into lt neck. Lt neck was tender afterwards.   Occurred at rest.  HS troponin 3 ;  DDimer <0.27  And CMP, Lipase and CBC normal.  EKG then SR normal EKG and no ST changes. CXR normal.    Today pt presented this AM with not feeling right with arms tingling and heart palpitations.  HTN.  Also bad heartburn.   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  He rested and BP came down,  But then broke out in a sweat.  + Lt ant chest pain.    EKG:  The EKG was personally reviewed and demonstrates:  SR at 63 and normal EKG, no ST changes and no changes from prior.   Telemetry:  Telemetry was personally reviewed and demonstrates:  SR  HS troponin 3 no change from the 29th.  And follow up <2 NA 138 K+ 4.1, Cr 1.0 AST 34  ALT 33  Hgb 15.6 WBC 7.8 Plts 269  COVID neg CXR 2 V neg   BP 133/84 P 52-61  Afebrile  He has taken his home meds this AM.  Feeling better an no further pain.     Heart Pathway Score: 1    Past Medical History:  Diagnosis Date  . Arrhythmia    PALPITATIONS AND PVC - improved with cutting down on caffeine  . Cough variant asthma 05/28/2014   05/27/2014 p extensive coaching HFA effectiveness =    75% > try  dulera 100 2bid  - PFT's 07/03/2014 FEV1 4.15 (98%) ratio 89 and no chang p B2 and nl fef25-75  And nl dlco    . Diverticulosis 07/2015   by CT scan  . GERD (gastroesophageal reflux disease)   . Hepatic steatosis   . Hypertension    h/o LVH, resolved  . Migraines   . Prostatitis 05/09/2013  . Seasonal allergies   . 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 Estanislado Pandy)  . Tremor   . Urticaria     Past Surgical History:  Procedure Laterality Date  . ESOPHAGOGASTRODUODENOSCOPY  08/2018   esophagus dilated, biopsy showed chronic gastritis with mod reflux changes [facility])  . NASAL RECONSTRUCTION WITH SEPTAL REPAIR  2017   [place]  . OTHER SURGICAL HISTORY  1992   facial surgery  . ROTATOR CUFF REPAIR  2009   left  . SINOSCOPY    . UMBILICAL HERNIA REPAIR  05/10/2012   Procedure: HERNIA REPAIR UMBILICAL ADULT;  Surgeon: Harl Bowie, MD;  Location: [facility];  Service: General;  Laterality: N/A;     Home Medications:  Prior to Admission medications   Medication Sig Start Date End Date Taking? Authorizing Provider  albuterol (VENTOLIN HFA) 108 (90 Base) MCG/ACT inhaler Inhale 2 puffs into the lungs every 4 (four) hours as needed for wheezing or shortness of breath. 03/30/17  Yes Parrett, Tammy S, NP  amLODipine (NORVASC) 10 MG tablet TAKE 1 TABLET BY MOUTH EVERY DAY Patient taking differently: Take 10 mg by mouth daily.  01/08/19  Yes Ria Bush, MD  cetirizine (ZYRTEC) 10 MG tablet Take 10 mg by mouth at bedtime. As needed for seasonal allergies   Yes [provider]  esomeprazole (NEXIUM) 40 MG capsule TAKE 1 CAPSULE BY MOUTH EVERY DAY BY MOUTH Patient taking differently: Take 40 mg by mouth daily.  04/25/19  Yes Ria Bush, MD  metoprolol succinate (TOPROL-XL) 50 MG 24 hr tablet TAKE 1 TABLET BY MOUTH EVERY DAY Patient taking differently: Take 50 mg by mouth daily.  12/01/18  Yes Ria Bush, MD  Multiple  Vitamin (MULTIVITAMIN WITH MINERALS) TABS tablet Take 1 tablet by mouth daily.   Yes [provider]  omega-3 acid ethyl esters (LOVAZA) 1 g capsule Take 1 g by mouth daily.   Yes [provider]  diclofenac sodium (VOLTAREN) 1 % GEL Apply 2 g topically 4 (four) times daily. Patient not taking: Reported on 06/21/2019 12/25/18   Copland, Frederico Hamman, MD  methylPREDNISolone (MEDROL DOSEPAK) 4 MG TBPK tablet 6 day dose pack - take as directed Patient not taking: Reported on 06/21/2019 01/03/19   Edrick Kins, DPM  predniSONE (DELTASONE) 20 MG tablet Take two tablets daily for 3 days followed by one tablet daily for 4 days Patient not taking: Reported on 06/21/2019 02/02/19   Ria Bush, MD    Inpatient Medications: Scheduled Meds:  Continuous Infusions:  PRN Meds:   Allergies:   No Known Allergies  Social History:   Social History   Socioeconomic History  . Marital status: Married    Spouse name: Not on file  . Number of children: 2  . Years of education: Not on file  . Highest education level: Not on file  Occupational History  . Occupation: golf course maintenance    Employer: [employer]  Social Needs  . Financial resource strain: Not on file  . Food insecurity    Worry: Not on file    Inability: Not on file  . Transportation needs    Medical: Not on file    Non-medical: Not on file  Tobacco Use  . Smoking status: Former Smoker    Packs/day: 1.00    Years: 13.00    Pack years: 13.00    Types: Cigarettes    Quit date: 12/21/1999    Years since quitting: 19.5  . Smokeless tobacco: Former Systems developer    Types: Snuff    Quit date: 12/20/2013  Substance and Sexual Activity  . Alcohol use: No    Alcohol/week: 0.0 standard drinks    Comment: quit 2002  . Drug use: Not Currently    Types: Marijuana    Comment: quit 2002  . Sexual activity: Not on file  Lifestyle  . Physical activity    Days per week: Not on file    Minutes per session: Not on file  .  Stress: Not on file  Relationships  . Social Herbalist on phone: Not on file    Gets together: Not on file    Attends religious service: Not on file    Active  member of club or organization: Not on file    Attends meetings of clubs or organizations: Not on file    Relationship status: Not on file  . Intimate partner violence    Fear of current or ex partner: Not on file    Emotionally abused: Not on file    Physically abused: Not on file    Forced sexual activity: Not on file  Other Topics Concern  . 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 Asthma Father   . Arthritis Father        father, brother, Pgrandfather  . Chronic bronchitis Father   . Allergies Father   . Hypertension Brother        obese  . CAD Paternal Grandfather        MI x2  . Multiple sclerosis Brother   . Stroke Neg Hx   . Cancer Neg Hx      ROS:  Please see the history of present illness.  General:no colds or fevers, no weight changes Skin:no rashes or ulcers HEENT:no blurred vision, no congestion CV:see HPI PUL:see HPI GI:no diarrhea constipation or melena, no indigestion GU:no hematuria, no dysuria MS:no joint pain, no claudication Neuro:no syncope, no lightheadedness Endo:no diabetes, no thyroid disease  All other ROS reviewed and negative.     Physical Exam/Data:   Vitals:   06/21/19 1300 06/21/19 1315 06/21/19 1330 06/21/19 1400  BP: 133/84 126/76 129/84 135/84  Pulse: (!) 53 (!) 58 (!) 54 63  Resp: 19 (!) 21 19 (!) 24  Temp:      TempSrc:      SpO2: 96% 93% 95% 94%   No intake or output data in the 24 hours ending 06/21/19 1403 Last 3 Weights 06/18/2019 12/25/2018 11/20/2018  Weight (lbs) 242 lb 8.1 oz 242 lb 8 oz 241 lb  8 oz  Weight (kg) 110 kg 109.997 kg 109.544 kg     There is no height or weight on file to calculate BMI.  General:  Well nourished, well developed, in no acute distress  HEENT: normal Lymph: no adenopathy Neck: no JVD Endocrine:  No thryomegaly Vascular: No carotid bruits; post tib pulses 2+ bilaterally   Cardiac:  normal S1, S2; RRR; no murmur gallup rub or click Lungs:  clear to auscultation bilaterally, no wheezing, rhonchi or rales  Abd: soft, nontender, no hepatomegaly  Ext: no edema Musculoskeletal:  No deformities, BUE and BLE strength normal and equal Skin: warm and dry  Neuro:  Alert and oriented X 3 MAE, follows commands, no focal abnormalities noted Psych:  Normal affect     Relevant CV Studies: Last echo 2010 was normal, no LVH and mild MR  Laboratory Data:  High Sensitivity Troponin:   Recent Labs  Lab 06/18/19 0218 06/21/19 1005 06/21/19 1213  TROPONINIHS 3 3 <2     Cardiac EnzymesNo results for input(s): TROPONINI in the last 168 hours. No results for input(s): TROPIPOC in the last 168 hours.  Chemistry Recent Labs  Lab 06/18/19 0218 06/21/19 1005  NA 139 138  K 3.9 4.1  CL 105 106  CO2 26 22  GLUCOSE 100* 102*  BUN 9 6  CREATININE 0.96 1.00  CALCIUM 8.8* 8.9  GFRNONAA >60 >60  GFRAA >60 >60  ANIONGAP 8 10    Recent Labs  Lab 06/18/19 0218 06/21/19 1005  PROT 6.7 6.7  ALBUMIN 4.0 4.1  AST 21 34  ALT 30 33  ALKPHOS 69 66  BILITOT 0.6 1.2   Hematology Recent Labs  Lab 06/18/19 0218 06/21/19 1005  WBC 8.3 7.8  RBC 5.26 5.16  HGB 15.9 15.6  HCT 46.1 44.6  MCV 87.6 86.4  MCH 30.2 30.2  MCHC 34.5 35.0  RDW 12.3 12.5  PLT 271 269   BNPNo results for input(s): BNP, PROBNP in the last 168 hours.  DDimer  Recent Labs  Lab 06/18/19 0218  DDIMER <0.27     Radiology/Studies:  Dg Chest 2 View  Result Date: 06/21/2019 CLINICAL DATA:  Chest pain and high blood pressure EXAM: CHEST - 2 VIEW COMPARISON:  06/18/2019 FINDINGS: Mild  left base scarring. There is no edema, consolidation, effusion, or pneumothorax. Normal heart size and mediastinal contours. IMPRESSION: Negative chest. Electronically Signed   By: Monte Fantasia M.D.   On: 06/21/2019 10:42   Dg Chest 2 View  Result Date: 06/18/2019 CLINICAL DATA:  48 y/o  M; chest pain. EXAM: CHEST - 2 VIEW COMPARISON:  06/04/2018 chest radiograph FINDINGS: Stable heart size and mediastinal contours are within normal limits. Both lungs are clear. The visualized skeletal structures are unremarkable. IMPRESSION: No active cardiopulmonary disease. Electronically Signed   By: Kristine Garbe M.D.   On: 06/18/2019 03:15    Assessment and Plan:   1. Chest pain with neg HS troponin last visit and neg X 2  today - EKG without ST changes,  CXR clear, ddimer was neg.   Dr. Acie Fredrickson to see. Will arrange outpt cardiac CTA  2. Palpitations  not in ER.  (hx of PVCs)   3. HTN stable amlodipine 10 mg toprol 50 XL      For questions or updates, please contact [place] Please consult [email or web address] for contact info under     Signed, Cecilie Kicks, NP  06/21/2019 2:03 PM   Attending Note:   The patient was seen and examined.  Agree with assessment and plan as noted above.  Changes made to the above note as needed.  Patient seen and independently examined with  Cecilie Kicks, NP .   We discussed all aspects of the encounter. I agree with the assessment and plan as stated above.  1.   Chest pain :   Very atypical.   pleuretic .   Associated with stresss / anxiety which caused his BP to increase.  .   HS troponin is very low / negative x 2. ECG shows no changes.   He has been eating a very poor diet recently .  Lots of fast foods and salty foods.   Ive encouraged him to work on a better diet, exercise, weight loss program .   He needs to avoid salty foods.  We reviewed several take out options that are good for him.  We will arrange a Coronary CT angiogram.   He will call  us back if he has recurrent issues.   2.  HTN:  On amlodipine.   He has been eating more salt than he should and his BP has been higher.  We talked at some length about health options for him.    Ok for DC from the ER    I have spent a total of 40  minutes with patient reviewing hospital  notes , telemetry, EKGs, labs and examining patient as well as establishing an assessment and plan that was discussed with the patient. > 50% of time was spent in direct patient care.   Thayer Headings, Brooke Bonito., MD, [facility] 06/21/2019, 2:35 PM [address]  [place] Pager [phone]

## 2019-06-21 NOTE — Telephone Encounter (Signed)
Please arrive at the [facility] main entrance of [facility] at      (30-45 minutes prior to test start time)  [facility] [place], [place] [address] [phone]  Proceed to the [facility] Radiology Department (First Floor).  Please follow these instructions carefully (unless otherwise directed):  Hold all erectile dysfunction medications at least 48 hours prior to test.  On the Night Before the Test: . Be sure to Drink plenty of water. . Do not consume any caffeinated/decaffeinated beverages or chocolate 12 hours prior to your test. . Do not take any antihistamines 12 hours prior to your test. .   On the Day of the Test: . Drink plenty of water. Do not drink any water within one hour of the test. . Do not eat any food 4 hours prior to the test. . You may take your regular medications prior to the test.  . Take metoprolol (Lopressor) 50 mg two hours prior to test. . If your heart rate is greater than 55 beats per min take 100 mg of Metoprolol         After the Test: . Drink plenty of water. . After receiving IV contrast, you may experience a mild flushed feeling. This is normal. . On occasion, you may experience a mild rash up to 24 hours after the test. This is not dangerous. If this occurs, you can take Benadryl 25 mg and increase your fluid intake. . If you experience trouble breathing, this can be serious. If it is severe call 911 IMMEDIATELY. If it is mild, please call our office.

## 2019-06-21 NOTE — Telephone Encounter (Signed)
Spoke to patient advised I received a message from Cecilie Kicks NP you have a post ED appointment scheduled with Almyra Deforest PA 07/06/19 at 3:45 pm.Patient was given directions to office.Also she wants you to have a cardiac ct for chest pain.Advised our scheduler will be calling back to schedule.Advised I will mail you the instructions.

## 2019-06-28 ENCOUNTER — Telehealth (HOSPITAL_COMMUNITY): Payer: Self-pay | Admitting: *Deleted

## 2019-06-29 ENCOUNTER — Telehealth: Payer: Self-pay | Admitting: *Deleted

## 2019-06-29 ENCOUNTER — Other Ambulatory Visit: Payer: Self-pay

## 2019-06-29 ENCOUNTER — Ambulatory Visit
Admission: RE | Admit: 2019-06-29 | Discharge: 2019-06-29 | Disposition: A | Discharge status: Home / Self Care | Payer: BC Managed Care – PPO | Source: Ambulatory Visit | Attending: Cardiology | Admitting: Cardiology

## 2019-06-29 DIAGNOSIS — R079 Chest pain, unspecified: Secondary | ICD-10-CM | POA: Diagnosis not present

## 2019-06-29 DIAGNOSIS — R0789 Other chest pain: Secondary | ICD-10-CM | POA: Diagnosis not present

## 2019-06-29 MED ORDER — METOPROLOL TARTRATE 5 MG/5ML IV SOLN
5.0000 mg | Freq: Once | INTRAVENOUS | Status: AC
  Administered 2019-06-29: 5 mg via INTRAVENOUS

## 2019-06-29 MED ORDER — NITROGLYCERIN 0.4 MG SL SUBL
0.8000 mg | SUBLINGUAL_TABLET | Freq: Once | SUBLINGUAL | Status: AC
  Administered 2019-06-29: 0.8 mg via SUBLINGUAL

## 2019-06-29 MED ORDER — IOHEXOL 350 MG/ML SOLN
100.0000 mL | Freq: Once | INTRAVENOUS | Status: AC | PRN
  Administered 2019-06-29: 100 mL via INTRAVENOUS

## 2019-06-29 NOTE — Progress Notes (Signed)
Patient tolerated CT without incident. Drank pepsi and ate crackers. Ambulated steady gait to exit.

## 2019-06-29 NOTE — Telephone Encounter (Signed)
Received fax requesting PA for Diclofenac Gel 1%.  PA completed on CoverMyMeds.  Sent to [facility] for review.  Can take up to 72 hours for a decision.

## 2019-07-02 NOTE — Telephone Encounter (Signed)
Completed phone call

## 2019-07-05 ENCOUNTER — Telehealth: Payer: Self-pay | Admitting: Physician Assistant

## 2019-07-05 NOTE — Telephone Encounter (Signed)
PA denied.

## 2019-07-05 NOTE — Telephone Encounter (Signed)
New Message ° ° ° °Left message to confirm appt and answer covid questions  °

## 2019-07-06 ENCOUNTER — Other Ambulatory Visit: Payer: Self-pay

## 2019-07-06 ENCOUNTER — Encounter: Payer: Self-pay | Admitting: Physician Assistant

## 2019-07-06 ENCOUNTER — Ambulatory Visit: Payer: BC Managed Care – PPO | Admitting: Physician Assistant

## 2019-07-06 VITALS — BP 138/84 | HR 87 | Ht 69.75 in | Wt 242.0 lb

## 2019-07-06 DIAGNOSIS — I10 Essential (primary) hypertension: Secondary | ICD-10-CM

## 2019-07-06 DIAGNOSIS — R0789 Other chest pain: Secondary | ICD-10-CM | POA: Diagnosis not present

## 2019-07-06 DIAGNOSIS — R079 Chest pain, unspecified: Secondary | ICD-10-CM

## 2019-07-06 NOTE — Progress Notes (Signed)
Cardiology Office Note    Date:  07/08/2019   ID:  Jordan Buchanan, DOB [birth year], MRN [id number]  PCP:  Ria Bush, MD  Cardiologist:  Dr. Martinique   Chief Complaint  Patient presents with  . Follow-up    EF followup.     History of Present Illness:  Jordan Buchanan is a 48 y.o. male with PMH of HTN, palpitation/PVC and a history of LVH.  Patient stopped smoking in 2006.  He previously followed by Dr. Doreatha Lew however last seen by Dr. Martinique in 2012.  Previous echocardiogram showed mild MR otherwise normal.  Patient was recently seen in the ER on 06/18/2019 with sharp left-sided chest pain radiating to the neck.  High-sensitivity troponin was 3.  D-dimer was negative.  Patient returned to the hospital on 06/21/2019 with heart palpitations and arm tingling.  He also had heartburn as well.  Blood pressure at the fire station was 200/101.  EKG was normal without ischemic changes.   Patient presents today for cardiology office visit.  His blood pressure has normalized.  The elevation of the blood pressure seems to be a one-time thing.  We discussed potential work-up for secondary hypertension in the future if he continued to have blood pressure spikes.  We can potentially consider work-up such as hyperaldosteronism, bilateral renal artery stenosis, and less likely pheochromocytoma.  At this time, I do not recommend any further work-up.  He denies any chest pain or shortness of breath.  He does not have any lower extremity edema, orthopnea or PND.  He can follow-up with cardiology service on a as needed basis.  He has been instructed to contact cardiology if blood pressure spikes again.  He apparently has been dealing with a great amount of stress working for [employer].  Some the stress may contribute to his symptoms.   Past Medical History:  Diagnosis Date  . Arrhythmia    PALPITATIONS AND PVC - improved with cutting down on caffeine  . Cough variant asthma 05/28/2014   05/27/2014 p  extensive coaching HFA effectiveness =    75% > try dulera 100 2bid  - PFT's 07/03/2014 FEV1 4.15 (98%) ratio 89 and no chang p B2 and nl fef25-75  And nl dlco    . Diverticulosis 07/2015   by CT scan  . GERD (gastroesophageal reflux disease)   . Hepatic steatosis   . Hypertension    h/o LVH, resolved  . Migraines   . Prostatitis 05/09/2013  . Seasonal allergies   . 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 Estanislado Pandy)  . Tremor   . Urticaria     Past Surgical History:  Procedure Laterality Date  . ESOPHAGOGASTRODUODENOSCOPY  08/2018   esophagus dilated, biopsy showed chronic gastritis with mod reflux changes [facility])  . NASAL RECONSTRUCTION WITH SEPTAL REPAIR  2017   [place]  . OTHER SURGICAL HISTORY  1992   facial surgery  . ROTATOR CUFF REPAIR  2009   left  . SINOSCOPY    . UMBILICAL HERNIA REPAIR  05/10/2012   Procedure: HERNIA REPAIR UMBILICAL ADULT;  Surgeon: Harl Bowie, MD;  Location: [place];  Service: General;  Laterality: N/A;    Current Medications: Outpatient Medications Prior to Visit  Medication Sig Dispense Refill  . albuterol (VENTOLIN HFA) 108 (90 Base) MCG/ACT inhaler Inhale 2 puffs into the lungs every 4 (four) hours as needed for wheezing or shortness of breath. 1 Inhaler 5  .  amLODipine (NORVASC) 10 MG tablet TAKE 1 TABLET BY MOUTH EVERY DAY (Patient taking differently: Take 10 mg by mouth daily. ) 30 tablet 7  . cetirizine (ZYRTEC) 10 MG tablet Take 10 mg by mouth at bedtime. As needed for seasonal allergies    . diclofenac sodium (VOLTAREN) 1 % GEL Apply 2 g topically 4 (four) times daily. 3 Tube 5  . esomeprazole (NEXIUM) 40 MG capsule TAKE 1 CAPSULE BY MOUTH EVERY DAY BY MOUTH (Patient taking differently: Take 40 mg by mouth daily. ) 30 capsule 3  . methylPREDNISolone (MEDROL DOSEPAK) 4 MG TBPK tablet 6 day dose pack - take as directed 21 tablet 0  . metoprolol succinate (TOPROL-XL) 50 MG 24 hr tablet  TAKE 1 TABLET BY MOUTH EVERY DAY (Patient taking differently: Take 50 mg by mouth daily. ) 90 tablet 3  . Multiple Vitamin (MULTIVITAMIN WITH MINERALS) TABS tablet Take 1 tablet by mouth daily.    Marland Kitchen omega-3 acid ethyl esters (LOVAZA) 1 g capsule Take 1 g by mouth daily.    . predniSONE (DELTASONE) 20 MG tablet Take two tablets daily for 3 days followed by one tablet daily for 4 days 10 tablet 0   No facility-administered medications prior to visit.      Allergies:   Patient has no known allergies.   Social History   Socioeconomic History  . Marital status: Married    Spouse name: Not on file  . Number of children: 2  . Years of education: Not on file  . Highest education level: Not on file  Occupational History  . Occupation: golf course maintenance    Employer: [employer]  Social Needs  . Financial resource strain: Not on file  . Food insecurity    Worry: Not on file    Inability: Not on file  . Transportation needs    Medical: Not on file    Non-medical: Not on file  Tobacco Use  . Smoking status: Former Smoker    Packs/day: 1.00    Years: 13.00    Pack years: 13.00    Types: Cigarettes    Quit date: 12/21/1999    Years since quitting: 19.5  . Smokeless tobacco: Former Systems developer    Types: Snuff    Quit date: 12/20/2013  Substance and Sexual Activity  . Alcohol use: No    Alcohol/week: 0.0 standard drinks    Comment: quit 2002  . Drug use: Not Currently    Types: Marijuana    Comment: quit 2002  . Sexual activity: Not on file  Lifestyle  . Physical activity    Days per week: Not on file    Minutes per session: Not on file  . Stress: Not on file  Relationships  . Social Herbalist on phone: Not on file    Gets together: Not on file    Attends religious service: Not on file    Active member of club or organization: Not on file    Attends meetings of clubs or organizations: Not on file    Relationship status: Not on file  Other Topics Concern  . 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   ROS:   Please see the history of present illness.    ROS All other systems reviewed and are negative.   PHYSICAL EXAM:   VS:  BP 138/84   Pulse 87   Ht 5' 9.75" (1.772 m)   Wt 242 lb (109.8 kg)   SpO2 97%   BMI 34.97 kg/m    GEN: Well nourished, well developed, in no acute distress  HEENT: normal  Neck: no JVD, carotid bruits, or masses Cardiac: RRR; no murmurs, rubs, or gallops,no edema  Respiratory:  clear to auscultation bilaterally, normal work of breathing GI: soft, nontender, nondistended, + BS MS: no deformity or atrophy  Skin: warm and dry, no rash Neuro:  Alert and Oriented x 3, Strength and sensation are intact Psych: euthymic mood, full affect  Wt Readings from Last 3 Encounters:  07/06/19 242 lb (109.8 kg)  06/27/19 242 lb (109.8 kg)  06/18/19 242 lb 8.1 oz (110 kg)      Studies/Labs Reviewed:   EKG:  EKG is not ordered today.   Recent Labs: 06/21/2019: ALT 33; BUN 6; Creatinine, Ser 1.00; Hemoglobin 15.6; Platelets 269; Potassium 4.1; Sodium 138   Lipid Panel    Component Value Date/Time   CHOL 158 06/14/2018   TRIG 100 06/14/2018   HDL 42 06/14/2018   LDLCALC 96 06/14/2018    Additional studies/ records that were reviewed today include:   N/A   ASSESSMENT:    1. Chest pain of uncertain etiology   2. Essential hypertension      PLAN:  In order of problems listed above:  1. Atypical chest pain: He has been under a great amount of stress recently.  Recent  hospitalization for chest pain revealed a negative troponin.  Chest pain occurred in the setting of blood pressure spike into the 200 range.  Since then, he has not really experienced any chest discomfort.  No further work-up is needed at this time.  Hypertension: Blood pressure occasionally spikes however normally his blood pressure is very well controlled.  I do not recommend any change to his blood pressure regimen.  If blood pressure spikes again, we can consider a renal artery Doppler to rule out a secondary cause for hypertension.    Medication Adjustments/Labs and Tests Ordered: Current medicines are reviewed at length with the patient today.  Concerns regarding medicines are outlined above.  Medication changes, Labs and Tests ordered today are listed in the Patient Instructions below. Patient Instructions  Medication Instructions:  Your physician recommends that you continue on your current medications as directed. Please refer to the Current Medication list given to you today.  If you need a refill on your cardiac medications before your next appointment, please call your pharmacy.   Lab work: NONE ordered at this time of appointment   If you have labs (blood work) drawn today and your tests are completely normal, you will receive your results only by: Marland Kitchen MyChart Message (if you have MyChart) OR . A paper copy in the mail If you have any lab test that is abnormal or we need to change your treatment, we will call you to review the results.  Testing/Procedures: NONE ordered at this time of appointment   Follow-Up: At [facility], you and your health needs are our priority.  As part of our continuing  mission to provide you with exceptional heart care, we have created designated Provider Care Teams.  These Care Teams include your primary Cardiologist (physician) and Advanced Practice Providers (APPs -  Physician Assistants and Nurse Practitioners) who all work together to provide you  with the care you need, when you need it. You will need to follow up AS NEEDED with Peter Martinique, MD or one of the following Advanced Practice Providers on your designated Care Team: [place], [place] . Fabian Sharp, PA-C  Any Other Special Instructions Will Be Listed Below (If Applicable).       Hilbert Corrigan, [place]  07/08/2019 10:35 PM    [facility] [place], [place],   [address] Phone: [phone]; Fax: [phone]

## 2019-07-06 NOTE — Patient Instructions (Addendum)
Medication Instructions:  Your physician recommends that you continue on your current medications as directed. Please refer to the Current Medication list given to you today.  If you need a refill on your cardiac medications before your next appointment, please call your pharmacy.   Lab work: NONE ordered at this time of appointment   If you have labs (blood work) drawn today and your tests are completely normal, you will receive your results only by: Marland Kitchen MyChart Message (if you have MyChart) OR . A paper copy in the mail If you have any lab test that is abnormal or we need to change your treatment, we will call you to review the results.  Testing/Procedures: NONE ordered at this time of appointment   Follow-Up: At [facility], you and your health needs are our priority.  As part of our continuing mission to provide you with exceptional heart care, we have created designated Provider Care Teams.  These Care Teams include your primary Cardiologist (physician) and Advanced Practice Providers (APPs -  Physician Assistants and Nurse Practitioners) who all work together to provide you with the care you need, when you need it. You will need to follow up AS NEEDED with Peter Martinique, MD or one of the following Advanced Practice Providers on your designated Care Team: [place], [place] . Fabian Sharp, PA-C  Any Other Special Instructions Will Be Listed Below (If Applicable).

## 2019-07-08 ENCOUNTER — Encounter: Payer: Self-pay | Admitting: Physician Assistant

## 2019-08-12 DIAGNOSIS — R197 Diarrhea, unspecified: Secondary | ICD-10-CM | POA: Diagnosis not present

## 2019-08-12 DIAGNOSIS — R52 Pain, unspecified: Secondary | ICD-10-CM | POA: Diagnosis not present

## 2019-08-12 DIAGNOSIS — R1032 Left lower quadrant pain: Secondary | ICD-10-CM | POA: Diagnosis not present

## 2019-08-12 DIAGNOSIS — R5383 Other fatigue: Secondary | ICD-10-CM | POA: Diagnosis not present

## 2019-08-13 ENCOUNTER — Telehealth: Payer: Self-pay

## 2019-08-13 ENCOUNTER — Other Ambulatory Visit: Payer: Self-pay

## 2019-08-13 DIAGNOSIS — Z20822 Contact with and (suspected) exposure to covid-19: Secondary | ICD-10-CM

## 2019-08-13 DIAGNOSIS — R6889 Other general symptoms and signs: Secondary | ICD-10-CM | POA: Diagnosis not present

## 2019-08-13 NOTE — Telephone Encounter (Addendum)
Would offer virtual visit in next few days, if covid test returns negative could do in office visit.   Reviewed [facility] records - lower abd pain with diarrhea and body aches placed on augmentin. No labwork checked.

## 2019-08-13 NOTE — Telephone Encounter (Signed)
Plz get records from [facility].  Was he given anything for nausea? Would offer to send in nausea medicine. Agree with push small sips of fluids throughout the day and bland foods for bowel rest. Could be viral gastroenteritis. To let us know if not improving for office visit (possiby virtual). Will await Covid test results.

## 2019-08-13 NOTE — Telephone Encounter (Signed)
Called [facility] and requested record.

## 2019-08-13 NOTE — Telephone Encounter (Signed)
Spoke with patient. He is doing a little better this afternoon. He has been using Pepto and Immodium-alternating. He does not need nausea medication at this time doing ok with medications he was using. Patient states he had a temp of 99.5 this afternoon. I advised to call us tomorrow to give [place] an update on how he is doing. He was wondering how long he should wait before following up with Dr Darnell Level. He is not sure why he keeps having fever.

## 2019-08-13 NOTE — Telephone Encounter (Signed)
Spoke with patient to follow up. Patient developed abdominal pain on 08/10/2019. He called and spoke with nurse with [facility] on 8/22 and 8/23. [facility] in [place] on 08/12/2019 and was advised they thought he had viral infection or diverticulitis. He started Augmentin yesterday 08/12/2019. He was having temp around 99ish usually and the highest was 100 to 101. He has not had any fever so far since 5 am today. His abdominal pain has been located lower abdomen area both sides. Has had nausea but no vomiting, chills and body aches present. He went and got tested for COVID this morning at [facility] drive up site at [facility].No known exposure to anyone with COVID that he is aware of. Patient is able to keep liquids down-has been drinking water, gatorade and gingerale. He has been eating blend foods. This morning for about 45 minutes he did not have any abdominal pain so he ate a bite of bacon and egg biscuit and as soon as he took one bite his stomach started to hurt and felt he would get sick. No vomiting. Advised patient I would send note to Dr Darnell Level but in the meantime if pain becomes severe to call 911. His wife is there with him.   Patient Name: [name] Gender: Male DOB: 05/30/1971 Age: 48 Y 9 M 16 D Return Phone Number: Jordan Buchanan:[phone] (Primary), [phone] (Secondary) Address: City/State/Zip: [place] [address] [facility] - Client Client Site [place] Physician Jordan Buchanan - MD Contact Type Call Who Is Calling Patient / Member / Family / Caregiver Call Type Triage / Clinical Relationship To Patient Self Return Phone Number [phone] (Primary) Chief Complaint Abdominal Pain Reason for Call Symptomatic / Request for [place] states that he has a fever of 99, upset stomach, and stomach pain. [place] Not Listed T[place]oc visit Translation No Nurse  Assessment Nurse: Jordan Gibney, RN, [place] Date/Time (Eastern Time): 08/12/2019 9:52:15 AM Confirm and document reason for call. If symptomatic, describe symptoms. ---Caller states he has diarrhea. Has been nauseated. Temp 99 this am. Has intermittent abd pain. Had chills starting on Thursday. Abd pain started on Friday afternoon. Has the patient had close contact with a person known or suspected to have the novel coronavirus illness OR traveled / lives in area with major community spread (including international travel) in the last 14 days from the onset of symptoms? * If Asymptomatic, screen for exposure and travel within the last 14 days. ---No Does the patient have any new or worsening symptoms? ---Yes Will a triage be completed? ---Yes Related visit to physician within the last 2 weeks? ---No Does the PT have any chronic conditions? (i.e. diabetes, asthma, this includes High risk factors for pregnancy, etc.) ---Yes List chronic conditions. ---HTN Is this a behavioral health or substance abuse call? ---No Guidelines Guideline Title Affirmed Question Affirmed Notes Nurse Date/Time (Eastern Time) Abdominal Pain - Male [1] MODERATE pain (e.g., interferes with normal activities) AND [2] pain comes and goes (cramps) AND [3] present > 24 hours (Exception: Jordan Buchanan, Therapist, sports, Vanita Ingles 08/12/2019 9:58:45 AM PLEASE NOTE: All timestamps contained within this report are represented as [place] Standard Time. CONFIDENTIALTY NOTICE: This fax transmission is intended only for the addressee. It contains information that is legally privileged, confidential or otherwise protected from use or disclosure. If you are not the intended recipient, you are strictly prohibited from reviewing, disclosing, copying using or disseminating any of this  information or taking any action in reliance on or regarding this information. If you have received this fax in error, please notify us immediately by telephone so that we can  arrange for its return to [place]. Phone: [phone], Toll-Free: [phone], Fax: [phone] Page: 2 of 2 Call Id: [id number] Guidelines Guideline Title Affirmed Question Affirmed Notes Nurse Date/Time Eilene Ghazi Time) pain with Vomiting or Diarrhea - see that Guideline) Disp. Time Eilene Ghazi Time) Disposition Final User 08/12/2019 10:02:40 AM See PCP within 24 Hours Yes Jordan Gibney, RN, Doreatha Lew Disagree/Comply Comply Caller Understands Yes PreDisposition Did not know what to do Care Advice Given Per Guideline SEE PCP WITHIN 24 HOURS: * IF OFFICE WILL BE CLOSED AND NO PCP (PRIMARY CARE PROVIDER) SECONDLEVEL TRIAGE: You need to be seen within the next 24 hours. A clinic or an urgent care center is often a good source of care if your doctor's office is closed or you can't get an appointment. * Drink adequate fluids. Eat a bland diet. DIET: CALL BACK IF: * Severe pain lasts over 1 hour * You become worse. Referrals GO TO FACILITY OTHER - SPECIFY

## 2019-08-14 LAB — NOVEL CORONAVIRUS, NAA: SARS-CoV-2, NAA: NOT DETECTED

## 2019-08-16 NOTE — Telephone Encounter (Signed)
Left message on vm for pt to call back.  Per Dr. Darnell Level, pt can be scheduled for in office visit since Covid was neg.

## 2019-08-17 NOTE — Telephone Encounter (Signed)
Left message on vm for pt to call back.  Per Dr. Darnell Level, pt can be scheduled for in office visit since Covid was neg.

## 2019-08-22 NOTE — Telephone Encounter (Signed)
Spoke with pt to schedule in office visit.  Says he still has a little diarrhea but abd pain is gone since taking abx.  Scheduled visit for 08/24/19 at 12:15.

## 2019-08-24 ENCOUNTER — Encounter: Payer: Self-pay | Admitting: Family Medicine

## 2019-08-24 ENCOUNTER — Ambulatory Visit (INDEPENDENT_AMBULATORY_CARE_PROVIDER_SITE_OTHER)
Admission: RE | Admit: 2019-08-24 | Discharge: 2019-08-24 | Disposition: A | Discharge status: Home / Self Care | Payer: BC Managed Care – PPO | Source: Ambulatory Visit | Attending: Family Medicine | Admitting: Family Medicine

## 2019-08-24 ENCOUNTER — Ambulatory Visit: Payer: BC Managed Care – PPO | Admitting: Family Medicine

## 2019-08-24 ENCOUNTER — Other Ambulatory Visit: Payer: Self-pay

## 2019-08-24 VITALS — BP 124/80 | HR 79 | Temp 97.6°F | Ht 69.75 in | Wt 236.5 lb

## 2019-08-24 DIAGNOSIS — R159 Full incontinence of feces: Secondary | ICD-10-CM

## 2019-08-24 DIAGNOSIS — R197 Diarrhea, unspecified: Secondary | ICD-10-CM | POA: Diagnosis not present

## 2019-08-24 DIAGNOSIS — K2100 Gastro-esophageal reflux disease with esophagitis, without bleeding: Secondary | ICD-10-CM

## 2019-08-24 DIAGNOSIS — I10 Essential (primary) hypertension: Secondary | ICD-10-CM

## 2019-08-24 DIAGNOSIS — R194 Change in bowel habit: Secondary | ICD-10-CM | POA: Diagnosis not present

## 2019-08-24 DIAGNOSIS — E669 Obesity, unspecified: Secondary | ICD-10-CM

## 2019-08-24 DIAGNOSIS — R1032 Left lower quadrant pain: Secondary | ICD-10-CM

## 2019-08-24 DIAGNOSIS — R079 Chest pain, unspecified: Secondary | ICD-10-CM

## 2019-08-24 DIAGNOSIS — K760 Fatty (change of) liver, not elsewhere classified: Secondary | ICD-10-CM

## 2019-08-24 DIAGNOSIS — Z23 Encounter for immunization: Secondary | ICD-10-CM

## 2019-08-24 DIAGNOSIS — R109 Unspecified abdominal pain: Secondary | ICD-10-CM | POA: Diagnosis not present

## 2019-08-24 NOTE — Patient Instructions (Addendum)
Restart regular probiotic after recent antibiotic course.  Bland diet over next few days.  Continue nexium daily, ok to take pepcid at night.  Xray today to evaluate abdominal symptoms.  We will refer you back to GI for further evaluation.

## 2019-08-24 NOTE — Progress Notes (Addendum)
This visit was conducted in person.  BP 124/80 (BP Location: Left Arm, Patient Position: Sitting, Cuff Size: Large)   Pulse 79   Temp 97.6 F (36.4 C) (Temporal)   Ht 5' 9.75" (1.772 m)   Wt 236 lb 8 oz (107.3 kg)   SpO2 94%   BMI 34.18 kg/m    CC: diarrhea, GERD Subjective:    Patient ID: Jordan Buchanan, male    DOB: [birth year], 48 y.o.   MRN: [id number]  HPI: Jordan Buchanan is a 48 y.o. male presenting on 08/24/2019 for Diarrhea (C/o diarrhea for 2 wks.  Has improved but concerned it continues. Has also had abd pain, nausea, chills and fever  8/22-25.  Seen at [facility] and given abx.  Other sxs have since resolved.  ) and Gastroesophageal Reflux (C/o SOB but resolves with an extra Nexium and/or Pepto Bismol. )   Complicated few months. Seen at ER 06/18/2019 for chest pain, seen back a few days later for ongoing pain with hypertensive urgency. Cards consulted Clinical research associate), note reviewed - atypical pleuritic pain thought GI related due to poor diet. Had coronary CTA with score of zero, no evidence of CAD.  [facility], LLQ abd pain, diarrhea, Tmax 100.1. treated possible diverticulitis with augmentin course. Finished abx yesterday. Covid tested and negative. Symptoms started 2 wks ago yesterday. Overall feeling better but has persistent indigestion and loose stools (4-5x/day). Abd pain has resolved. Still no solid stool. Some dyspnea attributed to GERD - previously improved with pepto bismol and probiotics. Has been taking extra pepcid at night in addition to nexium. Prior to symptoms, normally 2 stools/day.   No dysphagia, early satiety, nausea/vomiting, urinary symptoms. No greasy stools. No blood in stool.   Also notes some fecal incontinence/leaking a few times a week over the past year. Metamucil may have helped with this. Some trouble fully emptying.   Has never had diverticulitis in the past.   GERD - on nexium 40mg  daily.   ESOPHAGOGASTRODUODENOSCOPY 08/2018 - esophagus dilated, biopsy showed chronic gastritis with mod reflux changes [facility])  No h/o colonoscopy.      Relevant past medical, surgical, family and social history reviewed and updated as indicated. Interim medical history since our last visit reviewed. Allergies and medications reviewed and updated. Outpatient Medications Prior to Visit  Medication Sig Dispense Refill  . albuterol (VENTOLIN HFA) 108 (90 Base) MCG/ACT inhaler Inhale 2 puffs into the lungs every 4 (four) hours as needed for wheezing or shortness of breath. 1 Inhaler 5  . amLODipine (NORVASC) 10 MG tablet TAKE 1 TABLET BY MOUTH EVERY DAY (Patient taking differently: Take 10 mg by mouth daily. ) 30 tablet 7  . cetirizine (ZYRTEC) 10 MG tablet Take 10 mg by mouth at bedtime. As needed for seasonal allergies    . diclofenac sodium (VOLTAREN) 1 % GEL Apply 2 g topically 4 (four) times daily. 3 Tube 5  . esomeprazole (NEXIUM) 40 MG capsule TAKE 1 CAPSULE BY MOUTH EVERY DAY BY MOUTH (Patient taking differently: Take 40 mg by mouth daily. ) 30 capsule 3  . metoprolol succinate (TOPROL-XL) 50 MG 24 hr tablet TAKE 1 TABLET BY MOUTH EVERY DAY (Patient taking differently: Take 50 mg by mouth daily. ) 90 tablet 3  . Multiple Vitamin (MULTIVITAMIN WITH MINERALS) TABS tablet Take 1 tablet by mouth daily.    Marland Kitchen omega-3 acid ethyl esters (LOVAZA) 1 g capsule Take 1 g by mouth daily.    Marland Kitchen  methylPREDNISolone (MEDROL DOSEPAK) 4 MG TBPK tablet 6 day dose pack - take as directed 21 tablet 0  . predniSONE (DELTASONE) 20 MG tablet Take two tablets daily for 3 days followed by one tablet daily for 4 days 10 tablet 0   No facility-administered medications prior to visit.      Per HPI unless specifically indicated in ROS section below Review of Systems   Objective:    BP 124/80 (BP Location: Left Arm, Patient Position: Sitting, Cuff Size: Large)   Pulse 79   Temp 97.6 F (36.4 C) (Temporal)   Ht 5' 9.75"  (1.772 m)   Wt 236 lb 8 oz (107.3 kg)   SpO2 94%   BMI 34.18 kg/m   Wt Readings from Last 3 Encounters:  08/24/19 236 lb 8 oz (107.3 kg)  07/06/19 242 lb (109.8 kg)  06/27/19 242 lb (109.8 kg)    Physical Exam Vitals signs and nursing note reviewed.  Constitutional:      General: He is not in acute distress.    Appearance: Normal appearance. He is obese. He is not ill-appearing.  HENT:     Mouth/Throat:     Mouth: Mucous membranes are moist.     Pharynx: No posterior oropharyngeal erythema.  Eyes:     Pupils: Pupils are equal, round, and reactive to light.  Cardiovascular:     Rate and Rhythm: Normal rate and regular rhythm.     Pulses: Normal pulses.     Heart sounds: Normal heart sounds. No murmur.  Pulmonary:     Effort: Pulmonary effort is normal. No respiratory distress.     Breath sounds: Normal breath sounds. No wheezing, rhonchi or rales.  Abdominal:     General: Abdomen is flat. Bowel sounds are normal. There is no distension.     Palpations: Abdomen is soft. There is no mass.     Tenderness: There is no abdominal tenderness. There is no right CVA tenderness, left CVA tenderness, guarding or rebound.     Hernia: No hernia is present.  Genitourinary:    Prostate: Normal. Not enlarged, not tender and no nodules present.     Rectum: Normal. No mass, tenderness, anal fissure, external hemorrhoid or internal hemorrhoid. Normal anal tone.  Musculoskeletal:     Right lower leg: No edema.     Left lower leg: No edema.  Skin:    Findings: No rash.  Neurological:     Mental Status: He is alert.  Psychiatric:        Mood and Affect: Mood normal.        Behavior: Behavior normal.       Results for orders placed or performed in visit on 08/13/19  Novel Coronavirus, NAA ([facility])   Specimen: Oropharyngeal(OP) collection in vial transport medium   OROPHARYNGEA  TESTING  Result Value Ref Range   SARS-CoV-2, NAA Not Detected Not Detected   DG Abd 2 Views CLINICAL  DATA:  Diarrhea, abdominal pain  EXAM: ABDOMEN - 2 VIEW  COMPARISON:  CT 03/11/2018  FINDINGS: The bowel gas pattern is normal. There is no evidence of free air. No radio-opaque calculi or other significant radiographic abnormality is seen.  IMPRESSION: Negative.  Electronically Signed   By: Rolm Baptise M.D.   On: 08/24/2019 19:21   Assessment & Plan:   Problem List Items Addressed This Visit    Obesity, [name], BMI 30.0-34.9 (see actual BMI)    Noted 6 lb weight loss.  LLQ abdominal pain    Overall improved, treated for diverticulitis with augmentin by urgent care. No h/o colonoscopy.       Relevant Orders   DG Abd 2 Views (Completed)   Ambulatory referral to Gastroenterology   Incontinence of feces    Endorses chronic issue for the past 1+ yr. Normal rectal exam today. Given improvement with metamucil, ?overflow diarrhea - suggested continue metamucil, will also check abd series to eval for stool burden. Will refer to GI for further evaluation. Pt agrees with plan.       Relevant Orders   Ambulatory referral to Gastroenterology   Hypertension    BP well controlled at this time - elevations attributed to stress.       Hepatic steatosis    Lab Results  Component Value Date   ALT 33 06/21/2019   AST 34 06/21/2019   ALKPHOS 66 06/21/2019   BILITOT 1.2 06/21/2019        GERD (gastroesophageal reflux disease)    Last EGD 09/2018 with chronic reflux gastritis, now on daily nexium and pepcid with benefit.       Chest pain    Reassuring CTA score of 0. Chest pain thought GI related.       Bowel habit changes - Primary    Persistent loose stools after augmentin course. Recommended restart probiotic, continue bland diet for now. Will refer to GI for further evaluation. Check abd series to eval stool burden.       Relevant Orders   DG Abd 2 Views (Completed)   Ambulatory referral to Gastroenterology    Other Visit Diagnoses    Need for influenza  vaccination       Relevant Orders   Flu Vaccine QUAD 36+ mos IM (Completed)       No orders of the defined types were placed in this encounter.  Orders Placed This Encounter  Procedures  . DG Abd 2 Views    Standing Status:   Future    Number of Occurrences:   1    Standing Expiration Date:   10/23/2020    Order Specific Question:   Reason for Exam (SYMPTOM  OR DIAGNOSIS REQUIRED)    Answer:   diarrhea, abd pain    Order Specific Question:   Preferred imaging location?    Answer:   [facility]    Order Specific Question:   Radiology Contrast Protocol - do NOT remove file path    Answer:   [email or web address]  . Flu Vaccine QUAD 36+ mos IM  . Ambulatory referral to Gastroenterology    Referral Priority:   Routine    Referral Type:   Consultation    Referral Reason:   Specialty Services Required    Number of Visits Requested:   1    Patient Instructions  Restart regular probiotic after recent antibiotic course.  Bland diet over next few days.  Continue nexium daily, ok to take pepcid at night.  Xray today to evaluate abdominal symptoms.  We will refer you back to GI for further evaluation.    Follow up plan: No follow-ups on file.  Ria Bush, MD

## 2019-08-27 DIAGNOSIS — R194 Change in bowel habit: Secondary | ICD-10-CM | POA: Insufficient documentation

## 2019-08-27 DIAGNOSIS — R159 Full incontinence of feces: Secondary | ICD-10-CM | POA: Insufficient documentation

## 2019-08-27 NOTE — Assessment & Plan Note (Addendum)
Endorses chronic issue for the past 1+ yr. Normal rectal exam today. Given improvement with metamucil, ?overflow diarrhea - suggested continue metamucil, will also check abd series to eval for stool burden. Will refer to GI for further evaluation. Pt agrees with plan.

## 2019-08-27 NOTE — Assessment & Plan Note (Signed)
Noted 6 lb weight loss.

## 2019-08-27 NOTE — Addendum Note (Signed)
Addended by: Ria Bush on: 08/27/2019 09:04 AM   Modules accepted: Orders

## 2019-08-27 NOTE — Assessment & Plan Note (Signed)
Lab Results  Component Value Date   ALT 33 06/21/2019   AST 34 06/21/2019   ALKPHOS 66 06/21/2019   BILITOT 1.2 06/21/2019

## 2019-08-27 NOTE — Assessment & Plan Note (Addendum)
Persistent loose stools after augmentin course. Recommended restart probiotic, continue bland diet for now. Will refer to GI for further evaluation. Check abd series to eval stool burden.

## 2019-08-27 NOTE — Assessment & Plan Note (Signed)
Last EGD 09/2018 with chronic reflux gastritis, now on daily nexium and pepcid with benefit.

## 2019-08-27 NOTE — Assessment & Plan Note (Signed)
Overall improved, treated for diverticulitis with augmentin by urgent care. No h/o colonoscopy.

## 2019-08-27 NOTE — Assessment & Plan Note (Signed)
BP well controlled at this time - elevations attributed to stress.

## 2019-08-27 NOTE — Assessment & Plan Note (Signed)
Reassuring CTA score of 0. Chest pain thought GI related.

## 2019-09-06 ENCOUNTER — Other Ambulatory Visit
Admission: RE | Admit: 2019-09-06 | Discharge: 2019-09-06 | Disposition: A | Discharge status: Home / Self Care | Payer: BC Managed Care – PPO | Source: Ambulatory Visit | Attending: Gastroenterology | Admitting: Gastroenterology

## 2019-09-06 ENCOUNTER — Ambulatory Visit
Admission: RE | Admit: 2019-09-06 | Discharge: 2019-09-06 | Disposition: A | Discharge status: Home / Self Care | Payer: BC Managed Care – PPO | Source: Ambulatory Visit | Attending: Gastroenterology | Admitting: Gastroenterology

## 2019-09-06 ENCOUNTER — Other Ambulatory Visit: Payer: Self-pay | Admitting: Gastroenterology

## 2019-09-06 ENCOUNTER — Other Ambulatory Visit: Payer: Self-pay

## 2019-09-06 DIAGNOSIS — R1032 Left lower quadrant pain: Secondary | ICD-10-CM

## 2019-09-06 DIAGNOSIS — M549 Dorsalgia, unspecified: Secondary | ICD-10-CM | POA: Insufficient documentation

## 2019-09-06 DIAGNOSIS — R159 Full incontinence of feces: Secondary | ICD-10-CM | POA: Diagnosis not present

## 2019-09-06 DIAGNOSIS — R197 Diarrhea, unspecified: Secondary | ICD-10-CM | POA: Diagnosis not present

## 2019-09-06 DIAGNOSIS — R112 Nausea with vomiting, unspecified: Secondary | ICD-10-CM

## 2019-09-06 LAB — GASTROINTESTINAL PANEL BY PCR, STOOL (REPLACES STOOL CULTURE)

## 2019-09-06 LAB — POCT I-STAT CREATININE: Creatinine, Ser: 1 mg/dL (ref 0.61–1.24)

## 2019-09-06 LAB — C DIFFICILE QUICK SCREEN W PCR REFLEX
C Diff antigen: POSITIVE — AB
C Diff toxin: NEGATIVE

## 2019-09-06 LAB — CLOSTRIDIUM DIFFICILE BY PCR, REFLEXED: Toxigenic C. Difficile by PCR: POSITIVE — AB

## 2019-09-06 MED ORDER — IOHEXOL 300 MG/ML  SOLN
100.0000 mL | Freq: Once | INTRAMUSCULAR | Status: AC | PRN
  Administered 2019-09-06: 12:00:00 100 mL via INTRAVENOUS

## 2019-09-12 DIAGNOSIS — A0472 Enterocolitis due to Clostridium difficile, not specified as recurrent: Secondary | ICD-10-CM | POA: Diagnosis not present

## 2019-09-12 LAB — CALPROTECTIN, FECAL: Calprotectin, Fecal: 66 ug/g (ref 0–120)

## 2019-10-03 DIAGNOSIS — H5213 Myopia, bilateral: Secondary | ICD-10-CM | POA: Diagnosis not present

## 2019-10-03 DIAGNOSIS — D23111 Other benign neoplasm of skin of right upper eyelid, including canthus: Secondary | ICD-10-CM | POA: Diagnosis not present

## 2019-10-03 DIAGNOSIS — D23121 Other benign neoplasm of skin of left upper eyelid, including canthus: Secondary | ICD-10-CM | POA: Diagnosis not present

## 2019-10-03 DIAGNOSIS — H524 Presbyopia: Secondary | ICD-10-CM | POA: Diagnosis not present

## 2019-11-23 ENCOUNTER — Other Ambulatory Visit: Payer: Self-pay | Admitting: Podiatry

## 2019-11-23 ENCOUNTER — Other Ambulatory Visit: Payer: Self-pay | Admitting: Family Medicine

## 2019-12-05 ENCOUNTER — Other Ambulatory Visit: Payer: Self-pay

## 2019-12-05 ENCOUNTER — Encounter: Payer: Self-pay | Admitting: Family Medicine

## 2019-12-05 ENCOUNTER — Ambulatory Visit (INDEPENDENT_AMBULATORY_CARE_PROVIDER_SITE_OTHER): Payer: BC Managed Care – PPO | Admitting: Family Medicine

## 2019-12-05 VITALS — BP 143/87 | HR 62 | Temp 98.0°F | Wt 230.0 lb

## 2019-12-05 DIAGNOSIS — Z9109 Other allergy status, other than to drugs and biological substances: Secondary | ICD-10-CM | POA: Diagnosis not present

## 2019-12-05 NOTE — Patient Instructions (Signed)
For Covid testing:   Text "COVID" to [phone]  Or visit  [email or web address]

## 2019-12-05 NOTE — Progress Notes (Signed)
I connected with Jordan Buchanan on 12/05/19 at  9:20 AM EST by video and verified that I am speaking with the correct person using two identifiers.   I discussed the limitations, risks, security and privacy concerns of performing an evaluation and management service by video/telephone and the availability of in person appointments. I also discussed with the patient that there may be a patient responsible charge related to this service. The patient expressed understanding and agreed to proceed.  Patient location: Home Provider Location: [place] Participants: Lesleigh Noe and Jordan Buchanan   Subjective:     Jordan Buchanan is a 48 y.o. male presenting for Scratchy throat (nose feels dry, stomach feels off. Started yesterday-12/04/2019. )     Sore Throat  This is a new problem. The current episode started yesterday. The problem has been gradually improving. There has been no fever. The patient is experiencing no pain. Associated symptoms include congestion. Pertinent negatives include no abdominal pain, coughing, diarrhea, ear pain, headaches, shortness of breath or vomiting. Associated symptoms comments: grumbling stomach.   Exposed to someone on Monday - was only there for 20 minutes - socially distancing  Is being treated for the flu - but did not get tested for flu or covid  Was working around a lot of dust yesterday    Review of Systems  HENT: Positive for congestion. Negative for ear pain.   Respiratory: Negative for cough and shortness of breath.   Gastrointestinal: Negative for abdominal pain, diarrhea and vomiting.  Neurological: Negative for headaches.     Social History   Tobacco Use  Smoking Status Former Smoker  . Packs/day: 1.00  . Years: 13.00  . Pack years: 13.00  . Types: Cigarettes  . Quit date: 12/21/1999  . Years since quitting: 19.9  Smokeless Tobacco Former Systems developer  . Types: Snuff  . Quit date: 12/20/2013        Objective:   BP  Readings from Last 3 Encounters:  12/05/19 (!) 143/87  08/24/19 124/80  07/06/19 138/84   Wt Readings from Last 3 Encounters:  12/05/19 230 lb (104.3 kg)  08/24/19 236 lb 8 oz (107.3 kg)  07/06/19 242 lb (109.8 kg)    BP (!) 143/87 Comment: per patient  Pulse 62 Comment: per patient  Temp 98 F (36.7 C) Comment: per patient  Wt 230 lb (104.3 kg) Comment: per patient  BMI 33.24 kg/m    Physical Exam Pulmonary:     Effort: Pulmonary effort is normal. No respiratory distress.  Neurological:     Mental Status: He is oriented to person, place, and time.  Psychiatric:        Mood and Affect: Mood normal.        Behavior: Behavior normal.        Thought Content: Thought content normal.        Judgment: Judgment normal.           Assessment & Plan:   Problem List Items Addressed This Visit    None    Visit Diagnoses    Environmental allergies    -  Primary     Discussed that his exposure - wearing masks, social distancing - is not high risk. And given that his symptoms have already resolved do not feel he needs to pursue COVID testing at this time.   Emphasized that if he develops any new URI or stomach symptoms I would recommend getting tested for COVID and information for scheduling through  cone provided   Interactive audio and video telecommunications were attempted between this provider and patient, however failed, due to patient having technical difficulties OR patient did not have access to video capability.  We continued and completed visit with audio only.   Start Time: 9:34 End Time: 9:46     Return if symptoms worsen or fail to improve.  Lesleigh Noe, MD

## 2019-12-10 ENCOUNTER — Ambulatory Visit: Payer: BC Managed Care – PPO | Attending: Internal Medicine

## 2019-12-10 DIAGNOSIS — Z20822 Contact with and (suspected) exposure to covid-19: Secondary | ICD-10-CM

## 2019-12-10 DIAGNOSIS — Z20828 Contact with and (suspected) exposure to other viral communicable diseases: Secondary | ICD-10-CM | POA: Diagnosis not present

## 2019-12-11 LAB — NOVEL CORONAVIRUS, NAA: SARS-CoV-2, NAA: NOT DETECTED

## 2019-12-21 ENCOUNTER — Other Ambulatory Visit: Payer: Self-pay | Admitting: Family Medicine

## 2019-12-22 ENCOUNTER — Other Ambulatory Visit: Payer: Self-pay | Admitting: Podiatry

## 2020-01-21 ENCOUNTER — Telehealth: Payer: Self-pay | Admitting: Family Medicine

## 2020-01-21 NOTE — Telephone Encounter (Signed)
Please schedule CPE with fasting labs prior with Dr. G. 

## 2020-01-22 NOTE — Telephone Encounter (Signed)
Patient scheduled.

## 2020-01-22 NOTE — Telephone Encounter (Signed)
Noted  

## 2020-02-01 ENCOUNTER — Telehealth: Payer: Self-pay

## 2020-02-01 NOTE — Telephone Encounter (Signed)
LVM w [facility] lab info 2.12.2021 TLJ

## 2020-02-04 ENCOUNTER — Other Ambulatory Visit: Payer: Self-pay | Admitting: Family Medicine

## 2020-02-04 DIAGNOSIS — K760 Fatty (change of) liver, not elsewhere classified: Secondary | ICD-10-CM

## 2020-02-04 DIAGNOSIS — E66811 Obesity, class 1: Secondary | ICD-10-CM

## 2020-02-04 DIAGNOSIS — M47819 Spondylosis without myelopathy or radiculopathy, site unspecified: Secondary | ICD-10-CM

## 2020-02-04 DIAGNOSIS — I10 Essential (primary) hypertension: Secondary | ICD-10-CM

## 2020-02-04 DIAGNOSIS — E669 Obesity, unspecified: Secondary | ICD-10-CM

## 2020-02-05 ENCOUNTER — Other Ambulatory Visit (INDEPENDENT_AMBULATORY_CARE_PROVIDER_SITE_OTHER): Payer: BC Managed Care – PPO

## 2020-02-05 ENCOUNTER — Other Ambulatory Visit: Payer: Self-pay

## 2020-02-05 DIAGNOSIS — K760 Fatty (change of) liver, not elsewhere classified: Secondary | ICD-10-CM | POA: Diagnosis not present

## 2020-02-05 DIAGNOSIS — E669 Obesity, unspecified: Secondary | ICD-10-CM | POA: Diagnosis not present

## 2020-02-05 DIAGNOSIS — I10 Essential (primary) hypertension: Secondary | ICD-10-CM | POA: Diagnosis not present

## 2020-02-05 DIAGNOSIS — M47819 Spondylosis without myelopathy or radiculopathy, site unspecified: Secondary | ICD-10-CM

## 2020-02-05 LAB — CBC WITH DIFFERENTIAL/PLATELET
Basophils Absolute: 0 10*3/uL (ref 0.0–0.1)
Basophils Relative: 0.7 % (ref 0.0–3.0)
Eosinophils Absolute: 0.2 10*3/uL (ref 0.0–0.7)
Eosinophils Relative: 3.1 % (ref 0.0–5.0)
HCT: 45.9 % (ref 39.0–52.0)
Hemoglobin: 15.8 g/dL (ref 13.0–17.0)
Lymphocytes Relative: 30.5 % (ref 12.0–46.0)
Lymphs Abs: 2 10*3/uL (ref 0.7–4.0)
MCHC: 34.4 g/dL (ref 30.0–36.0)
MCV: 88.3 fl (ref 78.0–100.0)
Monocytes Absolute: 0.5 10*3/uL (ref 0.1–1.0)
Monocytes Relative: 7.5 % (ref 3.0–12.0)
Neutro Abs: 3.9 10*3/uL (ref 1.4–7.7)
Neutrophils Relative %: 58.2 % (ref 43.0–77.0)
Platelets: 280 10*3/uL (ref 150.0–400.0)
RBC: 5.19 Mil/uL (ref 4.22–5.81)
RDW: 13.9 % (ref 11.5–15.5)
WBC: 6.7 10*3/uL (ref 4.0–10.5)

## 2020-02-05 LAB — LIPID PANEL
Cholesterol: 144 mg/dL (ref 0–200)
HDL: 42.7 mg/dL (ref >39.00)
LDL Cholesterol: 83 mg/dL (ref 0–99)
NonHDL: 101.1
Total CHOL/HDL Ratio: 3
Triglycerides: 90 mg/dL (ref 0.0–149.0)
VLDL: 18 mg/dL (ref 0.0–40.0)

## 2020-02-05 LAB — COMPREHENSIVE METABOLIC PANEL
ALT: 32 U/L (ref 0–53)
AST: 21 U/L (ref 0–37)
Albumin: 4.3 g/dL (ref 3.5–5.2)
Alkaline Phosphatase: 65 U/L (ref 39–117)
BUN: 8 mg/dL (ref 6–23)
CO2: 27 mEq/L (ref 19–32)
Calcium: 9.1 mg/dL (ref 8.4–10.5)
Chloride: 104 mEq/L (ref 96–112)
Creatinine, Ser: 0.87 mg/dL (ref 0.40–1.50)
GFR: 94.35 mL/min (ref >60.00)
Glucose, Bld: 101 mg/dL — ABNORMAL HIGH (ref 70–99)
Potassium: 4.6 mEq/L (ref 3.5–5.1)
Sodium: 137 mEq/L (ref 135–145)
Total Bilirubin: 0.8 mg/dL (ref 0.2–1.2)
Total Protein: 6.5 g/dL (ref 6.0–8.3)

## 2020-02-05 LAB — MICROALBUMIN / CREATININE URINE RATIO
Creatinine,U: 245.2 mg/dL
Microalb Creat Ratio: 0.7 mg/g (ref 0.0–30.0)
Microalb, Ur: 1.6 mg/dL (ref 0.0–1.9)

## 2020-02-05 LAB — TSH: TSH: 0.62 u[IU]/mL (ref 0.35–4.50)

## 2020-02-05 LAB — SEDIMENTATION RATE: Sed Rate: 4 mm/hr (ref 0–15)

## 2020-02-11 ENCOUNTER — Encounter: Payer: Self-pay | Admitting: Family Medicine

## 2020-02-11 ENCOUNTER — Ambulatory Visit (INDEPENDENT_AMBULATORY_CARE_PROVIDER_SITE_OTHER): Payer: BC Managed Care – PPO | Admitting: Family Medicine

## 2020-02-11 ENCOUNTER — Other Ambulatory Visit: Payer: Self-pay

## 2020-02-11 DIAGNOSIS — R519 Headache, unspecified: Secondary | ICD-10-CM | POA: Diagnosis not present

## 2020-02-11 NOTE — Assessment & Plan Note (Signed)
1d h/o sinus headache most suggestive of acute sinusitis, likely viral given short duration. Supportive home care reviewed including continued mucinex, plenty of fluid, and start flonase.  With HA and recent trip to [facility] [place], reasonable to send for Covid test - signed up at [facility] for tomorrow.

## 2020-02-11 NOTE — Progress Notes (Signed)
Virtual visit completed through [place]. Due to national recommendations of social distancing due to COVID-19, a virtual visit is felt to be most appropriate for this patient at this time. Reviewed limitations of a virtual visit.   Patient location: home Provider location: [place] at [facility], office If any vitals were documented, they were collected by patient at home unless specified below.    BP (!) 148/98   Pulse 78   Temp (!) 97.5 F (36.4 C)   Ht 5' 9.75" (1.772 m)   Wt 235 lb (106.6 kg)   BMI 33.96 kg/m    CC: sinus headache Subjective:    Patient ID: Jordan Buchanan, male    DOB: [birth year], 49 y.o.   MRN: [id number]  HPI: Jordan Buchanan is a 49 y.o. male presenting on 02/11/2020 for Sinus Problem (C/o sinus pressure, pain and fatigue. Sxs started 02/10/20.  Tried Mucinex and Tylenol, which are helping.   [place] for 5 days.  Returned 02/09/19)   Today's physical was postponed due to acute illness symptoms.  1d h/o PNdrainage, sinus pressure headache. Mild scratchy throat and fatigue.  Known allergic rhinitis worse in spring/fall, takes zyrtec for this.   No cough, dyspnea, loss of taste/smell, body aches, abd pain, nausea, diarrhea  Treating with mucinex with benefit.   Recent trip to [place] 2/8-11/2020.  No known sick contacts or covid exposure.     Relevant past medical, surgical, family and social history reviewed and updated as indicated. Interim medical history since our last visit reviewed. Allergies and medications reviewed and updated. Outpatient Medications Prior to Visit  Medication Sig Dispense Refill  . albuterol (VENTOLIN HFA) 108 (90 Base) MCG/ACT inhaler Inhale 2 puffs into the lungs every 4 (four) hours as needed for wheezing or shortness of breath. 1 Inhaler 5  . amLODipine (NORVASC) 10 MG tablet TAKE 1 TABLET BY MOUTH EVERY DAY 30 tablet 0  . cetirizine (ZYRTEC) 10 MG tablet Take 10 mg by mouth at bedtime. As needed for seasonal  allergies    . diclofenac sodium (VOLTAREN) 1 % GEL Apply 2 g topically 4 (four) times daily. (Patient taking differently: Apply 2 g topically 4 (four) times daily as needed. As needed) 3 Tube 5  . meloxicam (MOBIC) 15 MG tablet TAKE 1 TABLET BY MOUTH EVERY DAY (Patient taking differently: As needed) 30 tablet 0  . metoprolol succinate (TOPROL-XL) 50 MG 24 hr tablet TAKE 1 TABLET BY MOUTH EVERY DAY 30 tablet 11  . Multiple Vitamin (MULTIVITAMIN WITH MINERALS) TABS tablet Take 1 tablet by mouth daily.    Marland Kitchen omega-3 acid ethyl esters (LOVAZA) 1 g capsule Take 1 g by mouth daily.    . pantoprazole (PROTONIX) 40 MG tablet Take 1 tablet by mouth 2 (two) times daily.     No facility-administered medications prior to visit.     Per HPI unless specifically indicated in ROS section below Review of Systems Objective:    BP (!) 148/98   Pulse 78   Temp (!) 97.5 F (36.4 C)   Ht 5' 9.75" (1.772 m)   Wt 235 lb (106.6 kg)   BMI 33.96 kg/m   Wt Readings from Last 3 Encounters:  02/11/20 235 lb (106.6 kg)  12/05/19 230 lb (104.3 kg)  08/24/19 236 lb 8 oz (107.3 kg)     Physical exam: Gen: alert, NAD, not ill appearing Pulm: speaks in complete sentences without increased work of breathing Psych: normal mood, normal thought content  Assessment & Plan:   Problem List Items Addressed This Visit    Sinus headache    1d h/o sinus headache most suggestive of acute sinusitis, likely viral given short duration. Supportive home care reviewed including continued mucinex, plenty of fluid, and start flonase.  With HA and recent trip to [facility] [place], reasonable to send for Covid test - signed up at [facility] for tomorrow.       Relevant Orders   Novel Coronavirus, NAA ([facility])       No orders of the defined types were placed in this encounter.  Orders Placed This Encounter  Procedures  . Novel Coronavirus, NAA ([facility])    Order Specific Question:   Is this test for diagnosis or screening     Answer:   Diagnosis of ill patient    Order Specific Question:   Symptomatic for COVID-19 as defined by CDC    Answer:   Yes    Order Specific Question:   Date of Symptom Onset    Answer:   02/10/2020    Order Specific Question:   Hospitalized for COVID-19    Answer:   No    Order Specific Question:   Admitted to ICU for COVID-19    Answer:   No    Order Specific Question:   Previously tested for COVID-19    Answer:   Yes    Order Specific Question:   Resident in a congregate (group) care setting    Answer:   No    Order Specific Question:   Is the patient student?    Answer:   No    Order Specific Question:   Employed in healthcare setting    Answer:   No    I discussed the assessment and treatment plan with the patient. The patient was provided an opportunity to ask questions and all were answered. The patient agreed with the plan and demonstrated an understanding of the instructions. The patient was advised to call back or seek an in-person evaluation if the symptoms worsen or if the condition fails to improve as anticipated.  Follow up plan: No follow-ups on file.  Ria Bush, MD

## 2020-02-12 ENCOUNTER — Ambulatory Visit: Payer: BC Managed Care – PPO | Attending: Internal Medicine

## 2020-02-12 DIAGNOSIS — Z20822 Contact with and (suspected) exposure to covid-19: Secondary | ICD-10-CM

## 2020-02-13 ENCOUNTER — Encounter: Payer: Self-pay | Admitting: Family Medicine

## 2020-02-13 LAB — NOVEL CORONAVIRUS, NAA: SARS-CoV-2, NAA: NOT DETECTED

## 2020-02-18 ENCOUNTER — Telehealth: Payer: Self-pay

## 2020-02-18 MED ORDER — AMOXICILLIN-POT CLAVULANATE 875-125 MG PO TABS: 1.0000 | ORAL_TABLET | Freq: Two times a day (BID) | ORAL | 0 refills | Status: AC

## 2020-02-18 NOTE — Telephone Encounter (Signed)
[place] Night - Client TELEPHONE ADVICE RECORD [facility] Patient Name: Jordan Buchanan Gender: Male DOB: 02-Mar-1971 Age: 49 Y 3 M 20 D Return Phone Number: [phone] (Primary) Address[place] [address] Client [place] Night - Client Client Site [facility] Physician Jordan Buchanan - MD Contact Type Call Who Is Calling Patient / Member / Family / Caregiver Call Type Triage / Clinical Relationship To Patient Self Return Phone Number [phone] (Primary) Chief Complaint Headache Reason for Call Symptomatic / Request for Jordan Buchanan states he was tested for COVID-19, and it came back negative. He still has sinus pressure and a headache. He has now developed eye discharge and would like to be prescribed antibiotics. Translation No Nurse Assessment Nurse: Jordan Rover, RN, Jordan Buchanan Date/Time (Eastern Time): 02/16/2020 4:31:40 PM Confirm and document reason for call. If symptomatic, describe symptoms. ---Caller states he was tested for COVID-19, and it came back negative. He still has sinus pressure and a headache. He has now developed eye discharge and would like to be prescribed antibiotics. Woke up last night and had mucus coming from both eyes described drainage color has had a yellow tint to it. Has the patient had close contact with a person known or suspected to have the novel coronavirus illness OR traveled / lives in area with major community spread (including international travel) in the last 14 days from the onset of symptoms? * If Asymptomatic, screen for exposure and travel within the last 14 days. ---Yes Does the patient have any new or worsening symptoms? ---Yes Will a triage be completed? ---Yes Related visit to physician within the last 2 weeks? ---Yes Does the PT have any chronic conditions? (i.e. diabetes, asthma, this includes  High risk factors for pregnancy, etc.) ---Yes List chronic conditions. ---htn Is this a behavioral health or substance abuse call? ---No Guidelines Guideline Title Affirmed Question Affirmed Notes Nurse Date/Time (Eastern Time) Eye - Pus or Discharge Eyelid is red and painful (or tender to touch) Jordan Rover, RN, Jordan Buchanan 02/16/2020 4:35:50 PM PLEASE NOTE: All timestamps contained within this report are represented as [place] Standard Time. CONFIDENTIALTY NOTICE: This fax transmission is intended only for the addressee. It contains information that is legally privileged, confidential or otherwise protected from use or disclosure. If you are not the intended recipient, you are strictly prohibited from reviewing, disclosing, copying using or disseminating any of this information or taking any action in reliance on or regarding this information. If you have received this fax in error, please notify us immediately by telephone so that we can arrange for its return to [place]. Phone: [phone], Toll-Free: [phone], Fax: [phone] Page: 2 of 2 Call Id: [id number] [place]. Time Jordan Buchanan Time) Disposition Final User 02/16/2020 4:40:51 PM See PCP within 24 Hours Yes Jordan Rover, RN, Jordan Buchanan Caller Disagree/Comply Comply Caller Understands Yes PreDisposition Call Doctor Care Advice Given Per Guideline SEE PCP WITHIN 24 HOURS: CALL BACK IF: * Blurred vision occurs * Fever occurs * You become worse. CARE ADVICE given per Eye - Pus or Discharge (Adult) guideline. Comments User: Jordan Singer, RN Date/Time Jordan Buchanan Time): 02/16/2020 4:37:16 PM rates eye pressure and pain 4-5/10 User: Jordan Singer, RN Date/Time Jordan Buchanan Time): 02/16/2020 4:41:59 PM Patient states he is not going to the doctor. User: Jordan Singer, RN Date/Time Jordan Buchanan Time): 02/16/2020 4:44:15 PM Patient said he saw his doctor a few days ago and was suppose to call back if not better and infact isnt  better but now has eye drainage and pain and  swelling with it. Advised he needs to see pcp within 24 hours and urgent care is a good source for weekend coverage. Pt declined. Referrals GO TO FACILITY REFUSED

## 2020-02-18 NOTE — Telephone Encounter (Signed)
Doesn't need OV.  plz call - I would like to have him start oral augmentin course. This should cover conjunctivitis of eyes. Let us know if not improving with treatment.

## 2020-02-18 NOTE — Telephone Encounter (Signed)
Lvm asking pt to call back.  Need to relay Dr. G's message.  

## 2020-03-06 ENCOUNTER — Ambulatory Visit: Payer: BC Managed Care – PPO | Attending: Internal Medicine

## 2020-03-06 DIAGNOSIS — Z23 Encounter for immunization: Secondary | ICD-10-CM

## 2020-03-06 NOTE — Progress Notes (Signed)
   Covid-19 Vaccination Clinic  Name:  Jordan Buchanan    MRN: [id number] DOB: 02-10-1971  03/06/2020  Jordan Buchanan was observed post Covid-19 immunization for 15 minutes without incident. He was provided with Vaccine Information Sheet and instruction to access the V-Safe system.   Jordan Buchanan was instructed to call 911 with any severe reactions post vaccine: Marland Kitchen Difficulty breathing  . Swelling of face and throat  . A fast heartbeat  . A bad rash all over body  . Dizziness and weakness   Immunizations Administered    Name Date Dose VIS Date Route   Pfizer COVID-19 Vaccine 03/06/2020  8:39 AM 0.3 mL 11/30/2019 Intramuscular   Manufacturer: [place]   Lot: [id number]   [place]: [id number]

## 2020-03-14 ENCOUNTER — Other Ambulatory Visit: Payer: Self-pay

## 2020-03-14 ENCOUNTER — Encounter: Payer: Self-pay | Admitting: Family Medicine

## 2020-03-14 ENCOUNTER — Ambulatory Visit (INDEPENDENT_AMBULATORY_CARE_PROVIDER_SITE_OTHER): Payer: BC Managed Care – PPO | Admitting: Family Medicine

## 2020-03-14 VITALS — BP 122/78 | HR 78 | Temp 97.9°F | Ht 70.0 in | Wt 243.1 lb

## 2020-03-14 DIAGNOSIS — E66811 Obesity, class 1: Secondary | ICD-10-CM

## 2020-03-14 DIAGNOSIS — I10 Essential (primary) hypertension: Secondary | ICD-10-CM

## 2020-03-14 DIAGNOSIS — M47819 Spondylosis without myelopathy or radiculopathy, site unspecified: Secondary | ICD-10-CM

## 2020-03-14 DIAGNOSIS — Z1211 Encounter for screening for malignant neoplasm of colon: Secondary | ICD-10-CM

## 2020-03-14 DIAGNOSIS — K760 Fatty (change of) liver, not elsewhere classified: Secondary | ICD-10-CM

## 2020-03-14 DIAGNOSIS — E669 Obesity, unspecified: Secondary | ICD-10-CM

## 2020-03-14 DIAGNOSIS — K219 Gastro-esophageal reflux disease without esophagitis: Secondary | ICD-10-CM

## 2020-03-14 DIAGNOSIS — Z1283 Encounter for screening for malignant neoplasm of skin: Secondary | ICD-10-CM

## 2020-03-14 DIAGNOSIS — Z0000 Encounter for general adult medical examination without abnormal findings: Secondary | ICD-10-CM | POA: Diagnosis not present

## 2020-03-14 DIAGNOSIS — J3089 Other allergic rhinitis: Secondary | ICD-10-CM

## 2020-03-14 MED ORDER — AMLODIPINE BESYLATE 10 MG PO TABS: 10.0000 mg | ORAL_TABLET | Freq: Every day | ORAL | 3 refills | Status: DC

## 2020-03-14 MED ORDER — METOPROLOL SUCCINATE ER 50 MG PO TB24: 50.0000 mg | ORAL_TABLET | Freq: Every day | ORAL | 3 refills | Status: DC

## 2020-03-14 NOTE — Patient Instructions (Addendum)
You are doing well today Continue metoprolol - watch blood pressures at home. If uncontrolled on metoprolol, restart amlodipine.  We will refer you for colonoscopy - check with insurance on coverage.  Work on regular exercise in routine. Return as needed or in 1 year for next physical .  Health Maintenance, Male Adopting a healthy lifestyle and getting preventive care are important in promoting health and wellness. Ask your health care provider about:  The right schedule for you to have regular tests and exams.  Things you can do on your own to prevent diseases and keep yourself healthy. What should I know about diet, weight, and exercise? Eat a healthy diet   Eat a diet that includes plenty of vegetables, fruits, low-fat dairy products, and lean protein.  Do not eat a lot of foods that are high in solid fats, added sugars, or sodium. Maintain a healthy weight Body mass index (BMI) is a measurement that can be used to identify possible weight problems. It estimates body fat based on height and weight. Your health care provider can help determine your BMI and help you achieve or maintain a healthy weight. Get regular exercise Get regular exercise. This is one of the most important things you can do for your health. Most adults should:  Exercise for at least 150 minutes each week. The exercise should increase your heart rate and make you sweat (moderate-intensity exercise).  Do strengthening exercises at least twice a week. This is in addition to the moderate-intensity exercise.  Spend less time sitting. Even light physical activity can be beneficial. Watch cholesterol and blood lipids Have your blood tested for lipids and cholesterol at 47 years of age, then have this test every 5 years. You may need to have your cholesterol levels checked more often if:  Your lipid or cholesterol levels are high.  You are older than 47 years of age.  You are at high risk for heart disease. What  should I know about cancer screening? Many types of cancers can be detected early and may often be prevented. Depending on your health history and family history, you may need to have cancer screening at various ages. This may include screening for:  Colorectal cancer.  Prostate cancer.  Skin cancer.  Lung cancer. What should I know about heart disease, diabetes, and high blood pressure? Blood pressure and heart disease  High blood pressure causes heart disease and increases the risk of stroke. This is more likely to develop in people who have high blood pressure readings, are of African descent, or are overweight.  Talk with your health care provider about your target blood pressure readings.  Have your blood pressure checked: ? Every 3-5 years if you are 80-25 years of age. ? Every year if you are 32 years old or older.  If you are between the ages of 14 and 84 and are a current or former smoker, ask your health care provider if you should have a one-time screening for abdominal aortic aneurysm (AAA). Diabetes Have regular diabetes screenings. This checks your fasting blood sugar level. Have the screening done:  Once every three years after age 16 if you are at a normal weight and have a low risk for diabetes.  More often and at a younger age if you are overweight or have a high risk for diabetes. What should I know about preventing infection? Hepatitis B If you have a higher risk for hepatitis B, you should be screened for this virus. Talk  with your health care provider to find out if you are at risk for hepatitis B infection. Hepatitis C Blood testing is recommended for:  Everyone born from [birth year] through 1963.  Anyone with known risk factors for hepatitis C. Sexually transmitted infections (STIs)  You should be screened each year for STIs, including gonorrhea and chlamydia, if: ? You are sexually active and are younger than 47 years of age. ? You are older than 47 years of  age and your health care provider tells you that you are at risk for this type of infection. ? Your sexual activity has changed since you were last screened, and you are at increased risk for chlamydia or gonorrhea. Ask your health care provider if you are at risk.  Ask your health care provider about whether you are at high risk for HIV. Your health care provider may recommend a prescription medicine to help prevent HIV infection. If you choose to take medicine to prevent HIV, you should first get tested for HIV. You should then be tested every 3 months for as long as you are taking the medicine. Follow these instructions at home: Lifestyle  Do not use any products that contain nicotine or tobacco, such as cigarettes, e-cigarettes, and chewing tobacco. If you need help quitting, ask your health care provider.  Do not use street drugs.  Do not share needles.  Ask your health care provider for help if you need support or information about quitting drugs. Alcohol use  Do not drink alcohol if your health care provider tells you not to drink.  If you drink alcohol: ? Limit how much you have to 0-2 drinks a day. ? Be aware of how much alcohol is in your drink. In the [place] one drink equals one 12 oz bottle of beer (355 mL), one 5 oz glass of wine (148 mL), or one 1 oz glass of hard liquor (44 mL). General instructions  Schedule regular health, dental, and eye exams.  Stay current with your vaccines.  Tell your health care provider if: ? You often feel depressed. ? You have ever been abused or do not feel safe at home. Summary  Adopting a healthy lifestyle and getting preventive care are important in promoting health and wellness.  Follow your health care provider's instructions about healthy diet, exercising, and getting tested or screened for diseases.  Follow your health care provider's instructions on monitoring your cholesterol and blood pressure. This information is not intended  to replace advice given to you by your health care provider. Make sure you discuss any questions you have with your health care provider. Document Revised: 11/29/2018 Document Reviewed: 11/29/2018 Elsevier Patient Education  2020 [employer].

## 2020-03-14 NOTE — Assessment & Plan Note (Addendum)
Preventative protocols reviewed and updated unless pt declined. Discussed healthy diet and lifestyle.  Discussed new USPSTF recommendations regarding colon cancer screening - will refer to GI for colonoscopy, he will verify coverage with insurance.  He would also like referral to dermatology clinic for skin cancer screening/eval.

## 2020-03-14 NOTE — Progress Notes (Signed)
This visit was conducted in person.  BP 122/78 (BP Location: Left Arm, Patient Position: Sitting, Cuff Size: Large)   Pulse 78   Temp 97.9 F (36.6 C) (Temporal)   Ht 5\' 10"  (1.778 m)   Wt 243 lb 2 oz (110.3 kg)   SpO2 97%   BMI 34.88 kg/m    CC: CPE Subjective:    Patient ID: Jordan Buchanan, male    DOB: 07-11-1971, 49 y.o.   MRN: [id number]  HPI: Jordan Buchanan is a 49 y.o. male presenting on 03/14/2020 for Annual Exam   Physical last month was deferred due to sinusitis/HA. Treated bacterial sinusitis with augmentin course with resolution.   Out of amlodipine for the past month. Not checking BP at home. Continues toprol XL 50mg  daily.  BP Readings from Last 3 Encounters:  03/14/20 122/78  02/11/20 (!) 148/98  12/05/19 (!) 143/87     Preventative: Colon cancer screening - discussed options - would like colonoscopy. Would like to see Dr Alice Reichert.  Cardiac history - had coronary calcium score of 0 (06/2019) Flu shot yearly Tdap 2019 COVID vaccine - 1st pfizer 03/06/20 Seat belt use discussed Sunscreen use discussed. No changing moles on skin. Would like to see derm for skin cancer screening.  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 Interim medical history since our last visit reviewed. Allergies and medications reviewed and updated. Outpatient Medications Prior to Visit  Medication Sig Dispense Refill  . albuterol (VENTOLIN HFA) 108 (90 Base) MCG/ACT inhaler Inhale 2 puffs into the lungs every 4 (four) hours as needed for wheezing or shortness of breath. 1 Inhaler 5  . cetirizine (ZYRTEC) 10 MG tablet Take 10 mg by mouth at bedtime. As needed for seasonal  allergies    . diclofenac sodium (VOLTAREN) 1 % GEL Apply 2 g topically 4 (four) times daily. (Patient taking differently: Apply 2 g topically 4 (four) times daily as needed. As needed) 3 Tube 5  . meloxicam (MOBIC) 15 MG tablet TAKE 1 TABLET BY MOUTH EVERY DAY (Patient taking differently: As needed) 30 tablet 0  . Multiple Vitamin (MULTIVITAMIN WITH MINERALS) TABS tablet Take 1 tablet by mouth daily.    Marland Kitchen omega-3 acid ethyl esters (LOVAZA) 1 g capsule Take 1 g by mouth daily.    . pantoprazole (PROTONIX) 40 MG tablet Take 1 tablet by mouth 2 (two) times daily.    Marland Kitchen amLODipine (NORVASC) 10 MG tablet TAKE 1 TABLET BY MOUTH EVERY DAY 30 tablet 0  . metoprolol succinate (TOPROL-XL) 50 MG 24 hr tablet TAKE 1 TABLET BY MOUTH EVERY DAY 30 tablet 11   No facility-administered medications prior to visit.     Per HPI unless specifically indicated in ROS section below Review of Systems  Constitutional: Negative for activity change, appetite change, chills, fatigue, fever and unexpected weight change.  HENT: Negative for hearing loss.   Eyes: Negative for visual disturbance.  Respiratory: Negative for cough, chest tightness, shortness of breath and wheezing.   Cardiovascular: Negative for chest pain, palpitations and leg swelling.  Gastrointestinal: Negative for abdominal distention, abdominal pain, blood in stool, constipation, diarrhea, nausea and vomiting.  Genitourinary:  Negative for difficulty urinating and hematuria.  Musculoskeletal: Negative for arthralgias, myalgias and neck pain.  Skin: Negative for rash.  Neurological: Negative for dizziness, seizures, syncope and headaches.  Hematological: Negative for adenopathy. Does not bruise/bleed easily.  Psychiatric/Behavioral: Negative for dysphoric mood. The patient is not nervous/anxious.    Objective:    BP 122/78 (BP Location: Left Arm, Patient Position: Sitting, Cuff Size: Large)   Pulse 78   Temp 97.9 F (36.6 C) (Temporal)   Ht 5\' 10"   (1.778 m)   Wt 243 lb 2 oz (110.3 kg)   SpO2 97%   BMI 34.88 kg/m   Wt Readings from Last 3 Encounters:  03/14/20 243 lb 2 oz (110.3 kg)  02/11/20 235 lb (106.6 kg)  12/05/19 230 lb (104.3 kg)    Physical Exam Vitals and nursing note reviewed.  Constitutional:      General: He is not in acute distress.    Appearance: Normal appearance. He is well-developed. He is obese. He is not ill-appearing.  HENT:     Head: Normocephalic and atraumatic.     Right Ear: Hearing, tympanic membrane, ear canal and external ear normal.     Left Ear: Hearing, tympanic membrane, ear canal and external ear normal.     Mouth/Throat:     Pharynx: Uvula midline.  Eyes:     General: No scleral icterus.    Extraocular Movements: Extraocular movements intact.     Conjunctiva/sclera: Conjunctivae normal.     Pupils: Pupils are equal, round, and reactive to light.  Cardiovascular:     Rate and Rhythm: Normal rate and regular rhythm.     Pulses: Normal pulses.          Radial pulses are 2+ on the right side and 2+ on the left side.     Heart sounds: Normal heart sounds. No murmur.  Pulmonary:     Effort: Pulmonary effort is normal. No respiratory distress.     Breath sounds: Normal breath sounds. No wheezing, rhonchi or rales.  Abdominal:     General: Abdomen is flat. Bowel sounds are normal. There is no distension.     Palpations: Abdomen is soft. There is no mass.     Tenderness: There is no abdominal tenderness. There is no guarding or rebound.     Hernia: No hernia is present.  Musculoskeletal:        General: Normal range of motion.     Cervical back: Normal range of motion and neck supple.     Right lower leg: No edema.     Left lower leg: No edema.  Lymphadenopathy:     Cervical: No cervical adenopathy.  Skin:    General: Skin is warm and dry.     Findings: No rash.  Neurological:     General: No focal deficit present.     Mental Status: He is alert and oriented to person, place, and time.      Comments: CN grossly intact, station and gait intact  Psychiatric:        Mood and Affect: Mood normal.        Behavior: Behavior normal.        Thought Content: Thought content normal.        Judgment: Judgment normal.       Results for orders placed or performed in visit on 02/12/20  Novel Coronavirus, NAA ([facility])   Specimen: Nasopharyngeal(NP) swabs in vial transport medium   NASOPHARYNGE  TESTING  Result Value Ref  Range   SARS-CoV-2, NAA Not Detected Not Detected   Assessment & Plan:  This visit occurred during the SARS-CoV-2 public health emergency.  Safety protocols were in place, including screening questions prior to the visit, additional usage of staff PPE, and extensive cleaning of exam room while observing appropriate contact time as indicated for disinfecting solutions.   Problem List Items Addressed This Visit    Spondyloarthropathy    H/o this - diagnosed by Dr Ouida Sills 2017. Currently stable period off disease modifying treatment. Inflammatory markers normal.       Perennial allergic rhinitis    We are entering allergy season - rec continue zyrtec daily with albuterol PRN.  Allergy testing 2019 positive for perennial molds (Dr Verlin Fester)      Obesity, [name], BMI 30.0-34.9 (see actual BMI)    Encouraged ongoing efforts at healthy diet and lifestyle changes to affect sustainable weight loss.       Hypertension    Chronic, stable on toprol XL 50mg  daily.  Has actually been off amlodipine for the past month and still with good control based on today's BP. I have refilled amlodipine but did ask him to monitor at home and only restart if BP >140/90.       Relevant Medications   amLODipine (NORVASC) 10 MG tablet   metoprolol succinate (TOPROL-XL) 50 MG 24 hr tablet   Hepatic steatosis    Recent LFTs normal. Encourage ongoing weight loss.       Health maintenance examination - Primary    Preventative protocols reviewed and updated unless pt  declined. Discussed healthy diet and lifestyle.  Discussed new USPSTF recommendations regarding colon cancer screening - will refer to GI for colonoscopy, he will verify coverage with insurance.  He would also like referral to dermatology clinic for skin cancer screening/eval.       GERD (gastroesophageal reflux disease)    Chronic, stable on protonix.  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 amLODipine (NORVASC) 10 MG tablet    Sig: Take 1 tablet (10 mg total) by mouth daily.    Dispense:  90 tablet    Refill:  3  . metoprolol succinate (TOPROL-XL) 50 MG 24 hr tablet    Sig: Take 1 tablet (50 mg total) by mouth daily. Take with or immediately following a meal.    Dispense:  90 tablet    Refill:  3   Orders Placed This Encounter  Procedures  . Ambulatory referral to Gastroenterology    Referral Priority:   Routine    Referral Type:   Consultation    Referral Reason:   Specialty Services Required    Number of Visits Requested:   1  . Ambulatory referral to Dermatology    Referral Priority:   Routine    Referral Type:   Consultation    Referral Reason:   Specialty Services Required    Requested Specialty:   Dermatology    Number of Visits Requested:   1    Patient instructions: You are doing well today Continue metoprolol - watch blood pressures at home. If uncontrolled on  metoprolol, restart amlodipine.  We will refer you for colonoscopy - check with insurance on coverage.  Work on regular exercise in routine. Return as needed or in 1 year for next physical .  Follow up plan: Return in about 1 year (around 03/14/2021) for annual exam, prior fasting for blood  work.  Ria Bush, MD

## 2020-03-15 ENCOUNTER — Ambulatory Visit: Payer: BC Managed Care – PPO

## 2020-03-15 NOTE — Assessment & Plan Note (Addendum)
We are entering allergy season - rec continue zyrtec daily with albuterol PRN.  Allergy testing 2019 positive for perennial molds (Dr Verlin Fester)

## 2020-03-15 NOTE — Assessment & Plan Note (Signed)
Recent LFTs normal. Encourage ongoing weight loss.

## 2020-03-15 NOTE — Assessment & Plan Note (Signed)
Encouraged ongoing efforts at healthy diet and lifestyle changes to affect sustainable weight loss.

## 2020-03-15 NOTE — Assessment & Plan Note (Addendum)
Chronic, stable on toprol XL 50mg  daily.  Has actually been off amlodipine for the past month and still with good control based on today's BP. I have refilled amlodipine but did ask him to monitor at home and only restart if BP >140/90.

## 2020-03-15 NOTE — Assessment & Plan Note (Signed)
H/o this - diagnosed by Dr Ouida Sills 2017. Currently stable period off disease modifying treatment. Inflammatory markers normal.

## 2020-03-15 NOTE — Assessment & Plan Note (Addendum)
Chronic, stable on protonix.  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

## 2020-03-20 DIAGNOSIS — K5720 Diverticulitis of large intestine with perforation and abscess without bleeding: Secondary | ICD-10-CM

## 2020-03-20 HISTORY — DX: Diverticulitis of large intestine with perforation and abscess without bleeding: K57.20

## 2020-04-01 ENCOUNTER — Ambulatory Visit: Payer: BC Managed Care – PPO | Attending: Internal Medicine

## 2020-04-01 DIAGNOSIS — Z23 Encounter for immunization: Secondary | ICD-10-CM

## 2020-04-01 NOTE — Progress Notes (Signed)
   Covid-19 Vaccination Clinic  Name:  Jordan Buchanan    MRN: [id number] DOB: March 30, 1971  04/01/2020  [name] was observed post Covid-19 immunization for 15 minutes without incident. He was provided with Vaccine Information Sheet and instruction to access the V-Safe system.   [name] was instructed to call 911 with any severe reactions post vaccine: Marland Kitchen Difficulty breathing  . Swelling of face and throat  . A fast heartbeat  . A bad rash all over body  . Dizziness and weakness   Immunizations Administered    Name Date Dose VIS Date Route   Pfizer COVID-19 Vaccine 04/01/2020  8:21 AM 0.3 mL 11/30/2019 Intramuscular   Manufacturer: [place]   Lot: [id number]   [place]: [id number]

## 2020-04-13 ENCOUNTER — Other Ambulatory Visit: Payer: Self-pay

## 2020-04-13 ENCOUNTER — Inpatient Hospital Stay (HOSPITAL_BASED_OUTPATIENT_CLINIC_OR_DEPARTMENT_OTHER)
Admission: EM | Admit: 2020-04-13 | Discharge: 2020-04-19 | DRG: 392 | Disposition: A | Discharge status: Home / Self Care | Payer: BC Managed Care – PPO | Attending: Internal Medicine | Admitting: Internal Medicine

## 2020-04-13 ENCOUNTER — Emergency Department (HOSPITAL_COMMUNITY)
Admission: EM | Admit: 2020-04-13 | Discharge: 2020-04-13 | Discharge status: Absent without leave | Payer: BC Managed Care – PPO

## 2020-04-13 ENCOUNTER — Encounter (HOSPITAL_BASED_OUTPATIENT_CLINIC_OR_DEPARTMENT_OTHER): Payer: Self-pay | Admitting: Emergency Medicine

## 2020-04-13 ENCOUNTER — Emergency Department (HOSPITAL_BASED_OUTPATIENT_CLINIC_OR_DEPARTMENT_OTHER): Payer: BC Managed Care – PPO

## 2020-04-13 DIAGNOSIS — D72829 Elevated white blood cell count, unspecified: Secondary | ICD-10-CM

## 2020-04-13 DIAGNOSIS — K5720 Diverticulitis of large intestine with perforation and abscess without bleeding: Secondary | ICD-10-CM | POA: Diagnosis not present

## 2020-04-13 DIAGNOSIS — Z03818 Encounter for observation for suspected exposure to other biological agents ruled out: Secondary | ICD-10-CM | POA: Diagnosis not present

## 2020-04-13 DIAGNOSIS — G4733 Obstructive sleep apnea (adult) (pediatric): Secondary | ICD-10-CM | POA: Diagnosis not present

## 2020-04-13 DIAGNOSIS — I10 Essential (primary) hypertension: Secondary | ICD-10-CM | POA: Diagnosis not present

## 2020-04-13 DIAGNOSIS — Z87442 Personal history of urinary calculi: Secondary | ICD-10-CM

## 2020-04-13 DIAGNOSIS — Z6834 Body mass index (BMI) 34.0-34.9, adult: Secondary | ICD-10-CM | POA: Diagnosis not present

## 2020-04-13 DIAGNOSIS — Z87891 Personal history of nicotine dependence: Secondary | ICD-10-CM

## 2020-04-13 DIAGNOSIS — J45991 Cough variant asthma: Secondary | ICD-10-CM | POA: Diagnosis not present

## 2020-04-13 DIAGNOSIS — J302 Other seasonal allergic rhinitis: Secondary | ICD-10-CM | POA: Diagnosis not present

## 2020-04-13 DIAGNOSIS — K760 Fatty (change of) liver, not elsewhere classified: Secondary | ICD-10-CM | POA: Diagnosis not present

## 2020-04-13 DIAGNOSIS — Z888 Allergy status to other drugs, medicaments and biological substances status: Secondary | ICD-10-CM | POA: Diagnosis not present

## 2020-04-13 DIAGNOSIS — Z79899 Other long term (current) drug therapy: Secondary | ICD-10-CM

## 2020-04-13 DIAGNOSIS — K5780 Diverticulitis of intestine, part unspecified, with perforation and abscess without bleeding: Secondary | ICD-10-CM

## 2020-04-13 DIAGNOSIS — Z825 Family history of asthma and other chronic lower respiratory diseases: Secondary | ICD-10-CM

## 2020-04-13 DIAGNOSIS — K219 Gastro-esophageal reflux disease without esophagitis: Secondary | ICD-10-CM | POA: Diagnosis not present

## 2020-04-13 DIAGNOSIS — Z20822 Contact with and (suspected) exposure to covid-19: Secondary | ICD-10-CM | POA: Diagnosis not present

## 2020-04-13 DIAGNOSIS — E669 Obesity, unspecified: Secondary | ICD-10-CM | POA: Diagnosis not present

## 2020-04-13 DIAGNOSIS — Z8249 Family history of ischemic heart disease and other diseases of the circulatory system: Secondary | ICD-10-CM | POA: Diagnosis not present

## 2020-04-13 DIAGNOSIS — I700 Atherosclerosis of aorta: Secondary | ICD-10-CM | POA: Diagnosis present

## 2020-04-13 DIAGNOSIS — K5792 Diverticulitis of intestine, part unspecified, without perforation or abscess without bleeding: Secondary | ICD-10-CM

## 2020-04-13 DIAGNOSIS — K5732 Diverticulitis of large intestine without perforation or abscess without bleeding: Secondary | ICD-10-CM | POA: Diagnosis not present

## 2020-04-13 DIAGNOSIS — K4090 Unilateral inguinal hernia, without obstruction or gangrene, not specified as recurrent: Secondary | ICD-10-CM | POA: Diagnosis not present

## 2020-04-13 HISTORY — DX: Diverticulitis of large intestine with perforation and abscess without bleeding: K57.20

## 2020-04-13 LAB — URINALYSIS, ROUTINE W REFLEX MICROSCOPIC
Bilirubin Urine: NEGATIVE
Glucose, UA: NEGATIVE mg/dL
Hgb urine dipstick: NEGATIVE
Ketones, ur: NEGATIVE mg/dL
Leukocytes,Ua: NEGATIVE
Nitrite: NEGATIVE
Protein, ur: NEGATIVE mg/dL
Specific Gravity, Urine: 1.015 (ref 1.005–1.030)
pH: 7.5 (ref 5.0–8.0)

## 2020-04-13 LAB — COMPREHENSIVE METABOLIC PANEL
ALT: 31 U/L (ref 0–44)
AST: 20 U/L (ref 15–41)
Albumin: 4.2 g/dL (ref 3.5–5.0)
Alkaline Phosphatase: 73 U/L (ref 38–126)
Anion gap: 7 (ref 5–15)
BUN: 7 mg/dL (ref 6–20)
CO2: 25 mmol/L (ref 22–32)
Calcium: 8.8 mg/dL — ABNORMAL LOW (ref 8.9–10.3)
Chloride: 101 mmol/L (ref 98–111)
Creatinine, Ser: 0.94 mg/dL (ref 0.61–1.24)
GFR calc Af Amer: >60 mL/min (ref >60)
GFR calc non Af Amer: >60 mL/min (ref >60)
Glucose, Bld: 132 mg/dL — ABNORMAL HIGH (ref 70–99)
Potassium: 3.8 mmol/L (ref 3.5–5.1)
Sodium: 133 mmol/L — ABNORMAL LOW (ref 135–145)
Total Bilirubin: 1.8 mg/dL — ABNORMAL HIGH (ref 0.3–1.2)
Total Protein: 7.4 g/dL (ref 6.5–8.1)

## 2020-04-13 LAB — CBC WITH DIFFERENTIAL/PLATELET
Abs Immature Granulocytes: 0.09 10*3/uL — ABNORMAL HIGH (ref 0.00–0.07)
Basophils Absolute: 0 10*3/uL (ref 0.0–0.1)
Basophils Relative: 0 %
Eosinophils Absolute: 0.1 10*3/uL (ref 0.0–0.5)
Eosinophils Relative: 1 %
HCT: 46.1 % (ref 39.0–52.0)
Hemoglobin: 16.5 g/dL (ref 13.0–17.0)
Immature Granulocytes: 1 %
Lymphocytes Relative: 12 %
Lymphs Abs: 1.9 10*3/uL (ref 0.7–4.0)
MCH: 30.7 pg (ref 26.0–34.0)
MCHC: 35.8 g/dL (ref 30.0–36.0)
MCV: 85.7 fL (ref 80.0–100.0)
Monocytes Absolute: 1.1 10*3/uL — ABNORMAL HIGH (ref 0.1–1.0)
Monocytes Relative: 7 %
Neutro Abs: 12.8 10*3/uL — ABNORMAL HIGH (ref 1.7–7.7)
Neutrophils Relative %: 79 %
Platelets: 276 10*3/uL (ref 150–400)
RBC: 5.38 MIL/uL (ref 4.22–5.81)
RDW: 12.7 % (ref 11.5–15.5)
WBC: 15.9 10*3/uL — ABNORMAL HIGH (ref 4.0–10.5)
nRBC: 0 % (ref 0.0–0.2)

## 2020-04-13 LAB — RESPIRATORY PANEL BY RT PCR (FLU A&B, COVID)
Influenza A by PCR: NEGATIVE
Influenza B by PCR: NEGATIVE
SARS Coronavirus 2 by RT PCR: NEGATIVE

## 2020-04-13 MED ORDER — METOPROLOL TARTRATE 5 MG/5ML IV SOLN: 2.5000 mg | Freq: Four times a day (QID) | INTRAVENOUS | Status: DC | PRN

## 2020-04-13 MED ORDER — HYDROMORPHONE HCL 1 MG/ML IJ SOLN
0.5000 mg | INTRAMUSCULAR | Status: DC | PRN
  Administered 2020-04-13 – 2020-04-15 (×6): 1 mg via INTRAVENOUS
  Filled 2020-04-13 (×7): qty 1

## 2020-04-13 MED ORDER — ONDANSETRON HCL 4 MG PO TABS: 4.0000 mg | ORAL_TABLET | Freq: Four times a day (QID) | ORAL | Status: DC | PRN

## 2020-04-13 MED ORDER — HYDROMORPHONE HCL 1 MG/ML IJ SOLN
1.0000 mg | Freq: Once | INTRAMUSCULAR | Status: AC
  Administered 2020-04-13: 1 mg via INTRAVENOUS
  Filled 2020-04-13: qty 1

## 2020-04-13 MED ORDER — SODIUM CHLORIDE 0.9 % IV SOLN: INTRAVENOUS | Status: DC

## 2020-04-13 MED ORDER — SODIUM CHLORIDE 0.9 % IV BOLUS
1000.0000 mL | Freq: Once | INTRAVENOUS | Status: AC
  Administered 2020-04-13: 1000 mL via INTRAVENOUS

## 2020-04-13 MED ORDER — MORPHINE SULFATE (PF) 4 MG/ML IV SOLN
4.0000 mg | Freq: Once | INTRAVENOUS | Status: AC
  Administered 2020-04-13: 4 mg via INTRAVENOUS
  Filled 2020-04-13: qty 1

## 2020-04-13 MED ORDER — PIPERACILLIN-TAZOBACTAM 3.375 G IVPB
3.3750 g | Freq: Three times a day (TID) | INTRAVENOUS | Status: DC
  Administered 2020-04-13 – 2020-04-19 (×18): 3.375 g via INTRAVENOUS
  Filled 2020-04-13 (×19): qty 50

## 2020-04-13 MED ORDER — ONDANSETRON HCL 4 MG/2ML IJ SOLN
4.0000 mg | Freq: Once | INTRAMUSCULAR | Status: AC
  Administered 2020-04-13: 4 mg via INTRAVENOUS
  Filled 2020-04-13: qty 2

## 2020-04-13 MED ORDER — MORPHINE SULFATE (PF) 2 MG/ML IV SOLN
2.0000 mg | INTRAVENOUS | Status: DC | PRN
  Filled 2020-04-13: qty 1

## 2020-04-13 MED ORDER — ONDANSETRON HCL 4 MG/2ML IJ SOLN: 4.0000 mg | Freq: Four times a day (QID) | INTRAMUSCULAR | Status: DC | PRN

## 2020-04-13 MED ORDER — ALBUTEROL SULFATE (2.5 MG/3ML) 0.083% IN NEBU: 3.0000 mL | INHALATION_SOLUTION | RESPIRATORY_TRACT | Status: DC | PRN

## 2020-04-13 MED ORDER — PIPERACILLIN-TAZOBACTAM 3.375 G IVPB 30 MIN
3.3750 g | Freq: Once | INTRAVENOUS | Status: AC
  Administered 2020-04-13: 3.375 g via INTRAVENOUS
  Filled 2020-04-13 (×2): qty 50

## 2020-04-13 MED ORDER — HYDROMORPHONE HCL 1 MG/ML IJ SOLN
1.0000 mg | Freq: Once | INTRAMUSCULAR | Status: AC
  Administered 2020-04-13: 1 mg via INTRAVENOUS
  Filled 2020-04-13 (×2): qty 1

## 2020-04-13 MED ORDER — ENOXAPARIN SODIUM 40 MG/0.4ML ~~LOC~~ SOLN
40.0000 mg | SUBCUTANEOUS | Status: DC
Start: 2020-04-13 — End: 2020-04-13

## 2020-04-13 NOTE — ED Notes (Signed)
NPO status: Last time solid foods, 04/12/20 @ 1930hrs, Last PO was cup of coffee @ 0600hrs, instructed to remain NPO

## 2020-04-13 NOTE — H&P (Addendum)
History and Physical    Jordan Buchanan FBP:[id number] DOB: September 02, 1971 DOA: 04/13/2020  PCP: Ria Bush, MD   Patient coming from: Home  I have personally briefly reviewed patient's old medical records in [place]  Chief Complaint: Abdominal pain  HPI: Jordan Buchanan is a 49 y.o. male with medical history significant of hypertension, obesity, GERD, migraine, nephrolithiasis, prior abdominal surgeries including umbilical hernia repair presented to [place] with abdominal pain that started.  Pain started in the right flank area and then seemed to move to the diffuse lower abdomen, right more than left.  Pain was constant, pressure-like, worse with movement/position changes with no relieving factors.  He also complained of nausea, dysuria and increased frequency of urination.  He denies fever, vomiting, black or bloody stools, chest pain, shortness of breath, palpitations or loss of consciousness.  ED Course: He was found to have leukocytosis and CT of the abdomen pelvis showed focal perforation in the mid sigmoid with no abscess.  General surgery was consulted who recommended admission under hospitalist service and they will consult.  Hospitalist service was asked to evaluate the patient.  Patient was transferred to [facility].  Review of Systems: As per HPI otherwise all other systems were reviewed and are negative.   Past Medical History:  Diagnosis Date  . Arrhythmia    PALPITATIONS AND PVC - improved with cutting down on caffeine  . Cough variant asthma 05/28/2014   05/27/2014 p extensive coaching HFA effectiveness =    75% > try dulera 100 2bid  - PFT's 07/03/2014 FEV1 4.15 (98%) ratio 89 and no chang p B2 and nl fef25-75  And nl dlco    . Diverticulosis 07/2015   by CT scan  . GERD (gastroesophageal reflux disease)   . Hepatic steatosis   . Hypertension    h/o LVH, resolved  . Migraines   . Prostatitis 05/09/2013  . Seasonal allergies   .  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 Estanislado Pandy)  . Tremor   . Urticaria     Past Surgical History:  Procedure Laterality Date  . ESOPHAGOGASTRODUODENOSCOPY  08/2018   esophagus dilated, biopsy showed chronic gastritis with mod reflux changes [facility])  . NASAL RECONSTRUCTION WITH SEPTAL REPAIR  2017   [place]  . OTHER SURGICAL HISTORY  1992   facial surgery  . ROTATOR CUFF REPAIR  2009   left  . SINOSCOPY    . UMBILICAL HERNIA REPAIR  05/10/2012   Procedure: HERNIA REPAIR UMBILICAL ADULT;  Surgeon: Harl Bowie, MD;  Location: [place];  Service: General;  Laterality: N/A;   Social history  reports that he quit smoking about 20 years ago. His smoking use included cigarettes. He has a 13.00 pack-year smoking history. He quit smokeless tobacco use about 6 years ago.  His smokeless tobacco use included snuff. He reports previous drug use. Drug: Marijuana. He reports that he does not drink alcohol.  Allergies  Allergen Reactions  . Hctz [Hydrochlorothiazide] Rash    Per derm rec stay off HCTZ (2018)    Family History  Problem Relation Age of Onset  . Asthma Father   . Arthritis Father        father, brother, Pgrandfather  . Chronic bronchitis Father   . Allergies Father   . Hypertension Brother        obese  . CAD Paternal Grandfather        MI x2  .  Multiple sclerosis Brother   . Stroke Neg Hx   . Cancer Neg Hx     Prior to Admission medications   Medication Sig Start Date End Date Taking? Authorizing Provider  albuterol (VENTOLIN HFA) 108 (90 Base) MCG/ACT inhaler Inhale 2 puffs into the lungs every 4 (four) hours as needed for wheezing or shortness of breath. 03/30/17   Parrett, Fonnie Mu, NP  amLODipine (NORVASC) 10 MG tablet Take 1 tablet (10 mg total) by mouth daily. 03/14/20   Ria Bush, MD  cetirizine (ZYRTEC) 10 MG tablet Take 10 mg by mouth at bedtime. As needed for seasonal allergies    [provider]  diclofenac sodium (VOLTAREN) 1 % GEL Apply 2 g topically 4 (four) times daily. Patient taking differently: Apply 2 g topically 4 (four) times daily as needed. As needed 12/25/18   Copland, Frederico Hamman, MD  meloxicam (MOBIC) 15 MG tablet TAKE 1 TABLET BY MOUTH EVERY DAY Patient taking differently: As needed 11/23/19   Edrick Kins, DPM  metoprolol succinate (TOPROL-XL) 50 MG 24 hr tablet Take 1 tablet (50 mg total) by mouth daily. Take with or immediately following a meal. 03/14/20   Ria Bush, MD  Multiple Vitamin (MULTIVITAMIN WITH MINERALS) TABS tablet Take 1 tablet by mouth daily.    [provider]  omega-3 acid ethyl esters (LOVAZA) 1 g capsule Take 1 g by mouth daily.    [provider]  pantoprazole (PROTONIX) 40 MG tablet Take 1 tablet by mouth 2 (two) times daily. 09/06/19 09/05/20  [provider]    Physical Exam: Vitals:   04/13/20 0954 04/13/20 0955 04/13/20 1034 04/13/20 1213  BP:  (!) 145/90 129/89 127/86  Pulse:  80 86 80  Resp:  16 18 16   Temp:  98.9 F (37.2 C)    TempSrc:  Oral    SpO2:  99% 97% 95%  Weight: 108.9 kg     Height: 5' 10"  (1.778 m)       Constitutional: NAD, calm, comfortable Vitals:   04/13/20 0954 04/13/20 0955 04/13/20 1034 04/13/20 1213  BP:  (!) 145/90 129/89 127/86  Pulse:  80 86 80  Resp:  16 18 16   Temp:  98.9 F (37.2 C)    TempSrc:  Oral    SpO2:  99% 97% 95%  Weight: 108.9 kg     Height: 5' 10"  (1.778 m)      Eyes: PERRL, lids and conjunctivae normal ENMT: Mucous membranes are dry.  Posterior pharynx clear of any exudate or lesions. Neck: normal, supple, no masses, no thyromegaly Respiratory: bilateral decreased breath sounds at bases, no wheezing, no crackles. Normal respiratory effort. No accessory muscle use.  Cardiovascular: S1 S2 positive, rate controlled. No extremity edema. 2+ pedal pulses.  Abdomen: lower quadrant tenderness present, no rebound tenderness, no masses palpated. No  hepatosplenomegaly. Bowel sounds positive.  Musculoskeletal: no clubbing / cyanosis. No joint deformity upper and lower extremities.  Skin: no rashes, lesions, ulcers. No induration Neurologic: CN 2-12 grossly intact. Moving extremities. No focal neurologic deficits.  Psychiatric: Normal judgment and insight. Alert and oriented x 3. Normal mood.    Labs on Admission: I have personally reviewed following labs and imaging studies  CBC: Recent Labs  Lab 04/13/20 1003  WBC 15.9*  NEUTROABS 12.8*  HGB 16.5  HCT 46.1  MCV 85.7  PLT 740   Basic Metabolic Panel: Recent Labs  Lab 04/13/20 1003  NA 133*  K 3.8  CL 101  CO2  25  GLUCOSE 132*  BUN 7  CREATININE 0.94  CALCIUM 8.8*   GFR: Estimated Creatinine Clearance: 121.4 mL/min (by C-G formula based on SCr of 0.94 mg/dL). Liver Function Tests: Recent Labs  Lab 04/13/20 1003  AST 20  ALT 31  ALKPHOS 73  BILITOT 1.8*  PROT 7.4  ALBUMIN 4.2   No results for input(s): LIPASE, AMYLASE in the last 168 hours. No results for input(s): AMMONIA in the last 168 hours. Coagulation Profile: No results for input(s): INR, PROTIME in the last 168 hours. Cardiac Enzymes: No results for input(s): CKTOTAL, CKMB, CKMBINDEX, TROPONINI in the last 168 hours. BNP (last 3 results) No results for input(s): PROBNP in the last 8760 hours. HbA1C: No results for input(s): HGBA1C in the last 72 hours. CBG: No results for input(s): GLUCAP in the last 168 hours. Lipid Profile: No results for input(s): CHOL, HDL, LDLCALC, TRIG, CHOLHDL, LDLDIRECT in the last 72 hours. Thyroid Function Tests: No results for input(s): TSH, T4TOTAL, FREET4, T3FREE, THYROIDAB in the last 72 hours. Anemia Panel: No results for input(s): VITAMINB12, FOLATE, FERRITIN, TIBC, IRON, RETICCTPCT in the last 72 hours. Urine analysis:    Component Value Date/Time   COLORURINE YELLOW 04/13/2020 1125   APPEARANCEUR CLEAR 04/13/2020 1125   LABSPEC 1.015 04/13/2020 1125    PHURINE 7.5 04/13/2020 1125   GLUCOSEU NEGATIVE 04/13/2020 1125   HGBUR NEGATIVE 04/13/2020 1125   [place] 04/13/2020 1125   BILIRUBINUR negative 01/31/2018 [place] 04/13/2020 1125   PROTEINUR NEGATIVE 04/13/2020 1125   UROBILINOGEN 0.2 01/31/2018 1548   UROBILINOGEN 0.2 09/13/2013 2134   NITRITE NEGATIVE 04/13/2020 1125   LEUKOCYTESUR NEGATIVE 04/13/2020 1125    Radiological Exams on Admission: CT Renal Stone Study  Result Date: 04/13/2020 CLINICAL DATA:  Abdominal pain unspecified. EXAM: CT ABDOMEN AND PELVIS WITHOUT CONTRAST TECHNIQUE: Multidetector CT imaging of the abdomen and pelvis was performed following the standard protocol without IV contrast. COMPARISON:  09/06/2019 FINDINGS: Lower chest: Incidental imaging of the lung bases is unremarkable. Hepatobiliary: Moderate to severe hepatic steatosis. No focal lesion. Lobular hepatic contours. No pericholecystic stranding. No biliary ductal dilation. Pancreas: Pancreas is normal. No signs ductal dilation or inflammation. Spleen: Spleen normal size without focal lesion. Adrenals/Urinary Tract: Adrenal glands are normal. Renal contours are smooth. No hydronephrosis. No nephrolithiasis. Urinary bladder is normal. Stomach/Bowel: Small bowel with normal appearance, stomach with normal appearance. Appendix is normal. Focal perforation, associated with small diverticulum in the mid sigmoid colon in the LEFT lower quadrant. Associated colonic thickening. Minimal diverticulosis elsewhere. Locules of extraluminal gas track into the LEFT retroperitoneum and sigmoid mesentery. There is fascial thickening along the LEFT hemipelvis. No abscess. No gas in the peritoneum proper. No omental stranding. Vascular/Lymphatic: Calcified atheromatous plaque minimal in the abdominal aorta. No evidence of aneurysmal dilation. No adenopathy. No sign of pelvic lymphadenopathy. Prostate with calcifications. Reproductive: As above Other:  Moderate fat containing inguinal hernias. Small fat containing umbilical hernia. Musculoskeletal: No acute musculoskeletal process. IMPRESSION: 1. Focal perforation, associated with small diverticulum in the mid sigmoid colon with locules of extraluminal gas track into the LEFT retroperitoneum and sigmoid mesentery. No abscess. No generalized peritoneal stranding at this time. 2. Moderate to severe hepatic steatosis. Lobular hepatic contours, correlate with any clinical or laboratory evidence of liver disease. 3. Moderate fat containing inguinal hernias and small fat containing umbilical hernia. 4. Aortic atherosclerosis. Critical Value/emergent results were called by telephone at the time of interpretation on 04/13/2020 at 11:05 am to provider  SAMANTHA PETRUCELLI , who verbally acknowledged these results. Aortic Atherosclerosis (ICD10-I70.0). Electronically Signed   By: Zetta Bills M.D.   On: 04/13/2020 11:06     Assessment/Plan  Acute sigmoid diverticulitis with focal perforation -Presented with abdominal pain and CT of the abdomen and pelvis showed focal sigmoid perforation but no abscess -General surgery has been consulted by ED provider who recommended conservative management with antibiotics and admission under hospitalist service -continue Zosyn.  Continue IV fluids.   -N.p.o. for now  Leukocytosis -From above.  Monitor  History of hypertension -Monitor blood pressure.  Use IV antihypertensives if needed  GERD -IV Protonix  DVT prophylaxis: Lovenox Code Status: Full Family Communication: Spoke to patient at bedside Disposition Plan: We will remain inpatient for several days for treatment with IV antibiotics for perforated diverticulitis Consults called: General surgery called by ED provider Admission status: MedSurg/inpatient  Severity of Illness: The appropriate patient status for this patient is INPATIENT. Inpatient status is judged to be reasonable and necessary in order to  provide the required intensity of service to ensure the patient's safety. The patient's presenting symptoms, physical exam findings, and initial radiographic and laboratory data in the context of their chronic comorbidities is felt to place them at high risk for further clinical deterioration. Furthermore, it is not anticipated that the patient will be medically stable for discharge from the hospital within 2 midnights of admission. The following factors support the patient status of inpatient.   " The patient's presenting symptoms include abdominal pain. " The worrisome physical exam findings include lower quadrant tenderness " The initial radiographic and laboratory data are worrisome because of leukocytosis/focal sigmoid perforation. " The chronic co-morbidities include hypertension/GERD.   * I certify that at the point of admission it is my clinical judgment that the patient will require inpatient hospital care spanning beyond 2 midnights from the point of admission due to high intensity of service, high risk for further deterioration and high frequency of surveillance required.Aline August MD [facility]  04/13/2020, 1:37 PM

## 2020-04-13 NOTE — Plan of Care (Signed)
  Problem: Clinical Measurements: Goal: Respiratory complications will improve Outcome: Progressing Goal: Cardiovascular complication will be avoided Outcome: Progressing   Problem: Activity: Goal: Risk for activity intolerance will decrease Outcome: Progressing   Problem: Elimination: Goal: Will not experience complications related to bowel motility Outcome: Progressing

## 2020-04-13 NOTE — ED Notes (Signed)
[facility] ED Nursing Note: pt has bluejeans, pair of socks and glasses on , wife has all his other belongings with her.

## 2020-04-13 NOTE — Consult Note (Signed)
Re:   Jordan Buchanan DOB:   05/20/1971 MRN:   [id number]  Chief Complaint Abdominal pain  ASSESEMENT AND PLAN: 1.  Sigmoid diverticulitis with focal air in mesentery  Plan:  NPO, antibiotics, repeat labs and exams.  2.  HTN x 20 years 3.  GERD 4.  Nephrolithiasis - once 5.  OSA  Does not wear CPAP 6.  Possible inguinal hernias - seen on CT scan - the patient has no symptoms.  Chief Complaint  Patient presents with  . Abdominal Pain   PHYSICIAN REQUESTING CONSULTATION: Ria Bush, MD  HISTORY OF PRESENT ILLNESS: Jordan Buchanan is a 49 y.o. (DOB: 1971-09-14)  white male whose primary care physician is Ria Bush, MD .  Wife, [name], in the room with the patient.   He had not prior history of colon disease.  He has GERD.  He had an umbilical hernia repair in 05/10/2012 by Dr. Ninfa Linden.  Yesterday morning, he developed right back pain.  He took some tylenol.  As the day went on, he had pain that localized more to his left side.  He was up all night and sought medical attention today.  CT scan of abdomen - 04/13/2020 - 1. Focal perforation, associated with small diverticulum in the mid sigmoid colon with locules of extraluminal gas track into the LEFT retroperitoneum and sigmoid mesentery. No abscess. No generalized peritoneal stranding at this time. 2. Moderate to severe hepatic steatosis. Lobular hepatic contours, correlate with any clinical or laboratory evidence of liver disease. 3. Moderate fat containing inguinal hernias and small fat containing umbilical hernia. 4. Aortic atherosclerosis.  WBC - 15,900 - 04/13/2020    Past Medical History:  Diagnosis Date  . Arrhythmia    PALPITATIONS AND PVC - improved with cutting down on caffeine  . Cough variant asthma 05/28/2014   05/27/2014 p extensive coaching HFA effectiveness =    75% > try dulera 100 2bid  - PFT's 07/03/2014 FEV1 4.15 (98%) ratio 89 and no chang p B2 and nl fef25-75  And nl dlco    . Diverticulosis  07/2015   by CT scan  . GERD (gastroesophageal reflux disease)   . Hepatic steatosis   . Hypertension    h/o LVH, resolved  . Migraines   . Prostatitis 05/09/2013  . Seasonal allergies   . 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 Estanislado Pandy)  . Tremor   . Urticaria       Past Surgical History:  Procedure Laterality Date  . ESOPHAGOGASTRODUODENOSCOPY  08/2018   esophagus dilated, biopsy showed chronic gastritis with mod reflux changes [facility])  . NASAL RECONSTRUCTION WITH SEPTAL REPAIR  2017   [place]  . OTHER SURGICAL HISTORY  1992   facial surgery  . ROTATOR CUFF REPAIR  2009   left  . SINOSCOPY    . UMBILICAL HERNIA REPAIR  05/10/2012   Procedure: HERNIA REPAIR UMBILICAL ADULT;  Surgeon: Harl Bowie, MD;  Location: [place];  Service: General;  Laterality: N/A;      Current Facility-Administered Medications  Medication Dose Route Frequency Provider Last Rate Last Admin  . HYDROmorphone (DILAUDID) injection 1 mg  1 mg Intravenous Once Petrucelli, Samantha R, PA-C      . piperacillin-tazobactam (ZOSYN) IVPB 3.375 g  3.375 g Intravenous Q8H Duanne Limerick, RPH          Allergies  Allergen Reactions  . Hctz [Hydrochlorothiazide] Rash    Per derm rec stay off  HCTZ (2018)    REVIEW OF SYSTEMS: Skin:  No history of rash.  No history of abnormal moles. Infection:  No history of hepatitis or HIV.  No history of MRSA. Neurologic:  No history of stroke.  No history of seizure.  No history of headaches. Cardiac:  HTN x 20 years.  Ca++ score of 0 on 06/29/2019. Pulmonary:  OSA, but does not use CPAP.  He does smokeless tobacco.  Endocrine:  No diabetes. No thyroid disease. Gastrointestinal:  See HPI Urologic:  History of nephrolithiasis.   Musculoskeletal:  No history of joint or back disease. Hematologic:  No bleeding disorder.  No history of anemia.  Not anticoagulated. Psycho-social:  The patient is oriented.   The  patient has no obvious psychologic or social impairment to understanding our conversation and plan.  SOCIAL and FAMILY HISTORY: Married.   Wife, [name], in room with patient.  She used to work for [employer], now works for [employer].  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  The patient has no family history of diverticulitis.  PHYSICAL EXAM: BP (!) 142/94   Pulse 80   Temp 99 F (37.2 C) (Oral)   Resp 16   Ht 5' 10"  (1.778 m)   Wt 108.9 kg   SpO2 97%   BMI 34.44 kg/m   General: Obese WM who is alert and generally healthy appearing.  Skin:  Inspection and palpation - no mass or rash. Eyes:  Conjunctiva and lids unremarkable.            Pupils are equal Ears, Nose, Mouth, and Throat:  Ears and nose unremarkable            He is wearing a mask. Neck: Supple. No mass, trachea midline.  No thyroid mass. Lymph Nodes:  No supraclavicular, cervical, or inguinal nodes. Lungs: Normal respiratory effort.  Clear to auscultation and symmetric breath sounds. Heart:  Palpation of the heart is normal.            Auscultation: RRR. No murmur or rub.  Abdomen: Soft. Has BS.  But is tender with guarding in the LLQ. Rectal: Not done. Musculoskeletal:  Good muscle strength and ROM  in upper and lower extremities.  Neurologic:  Grossly intact to motor and sensory function. Psychiatric: Normal judgement and insight. Behavior is normal.            Oriented to time, person, place.   DATA REVIEWED, COUNSELING AND COORDINATION OF CARE: Epic notes reviewed. I have personally seen and evaluated the patient, evaluated laboratory and imaging results, formulated the assessment and plan and placed orders. This requires high medical decision making. Total time spent with patient and charting: 50 minutes This is a consultation.  Alphonsa Overall, MD,  [facility], [address]  [place], [place]    [place] Phone:  [phone] FAX:  [phone]

## 2020-04-13 NOTE — ED Triage Notes (Signed)
Bilateral lower abd pain since yesterday. States pain started in his back. Denies urinary symptoms.

## 2020-04-13 NOTE — ED Notes (Signed)
PT wants to leave, will not wait.Marland KitchenMarland KitchenMarland Kitchen

## 2020-04-13 NOTE — ED Provider Notes (Signed)
[facility] EMERGENCY DEPARTMENT Provider Note   CSN: [id number] Arrival date & time: 04/13/20  0762     History Chief Complaint  Patient presents with  . Abdominal Pain    Jordan Buchanan is a 49 y.o. male with a history of hypertension, migraines, nephrolithiasis and prior abdominal surgeries including umbilical hernia repair who presents to the ED with complaints of abdominal pain that started yesterday. Patient states pain started in the R flank and then seemed to move to the diffuse lower abdomen (R>L). Pain is a constant pressure, worse with position changes/movements/going over bumps in the car. No alleviating factors. Tried tylenol without relief. Reports associated nausea, dysuria, & frequency. Feels somewhat like prior stones, has not required surgical intervention. Denies fever, emesis, melena, hematochezia, constipation, or testicular pain/swelling.   HPI     Past Medical History:  Diagnosis Date  . Arrhythmia    PALPITATIONS AND PVC - improved with cutting down on caffeine  . Cough variant asthma 05/28/2014   05/27/2014 p extensive coaching HFA effectiveness =    75% > try dulera 100 2bid  - PFT's 07/03/2014 FEV1 4.15 (98%) ratio 89 and no chang p B2 and nl fef25-75  And nl dlco    . Diverticulosis 07/2015   by CT scan  . GERD (gastroesophageal reflux disease)   . Hepatic steatosis   . Hypertension    h/o LVH, resolved  . Migraines   . Prostatitis 05/09/2013  . Seasonal allergies   . 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 Estanislado Pandy)  . Tremor   . Urticaria     Patient Active Problem List   Diagnosis Date Noted  . Incontinence of feces 08/27/2019  . Health maintenance examination 09/08/2018  . Chest pain 07/04/2018  . Urticaria 04/24/2018  . Skin rash 04/24/2018  . Family history of multiple sclerosis 04/27/2017  . Former smoker 04/27/2017  . Spondyloarthropathy   . OSA (obstructive sleep apnea) 08/19/2016  . Low back pain  radiating to left lower extremity 08/06/2016  . Obesity, [name], BMI 30.0-34.9 (see actual BMI) 04/21/2016  . Cough, persistent 03/10/2016  . LLQ abdominal pain 07/31/2015  . Cough variant asthma  05/28/2014  . Pruritic condition 04/18/2014  . Hepatic steatosis   . Migraines   . Perennial allergic rhinitis   . GERD (gastroesophageal reflux disease)   . Hypertension     Past Surgical History:  Procedure Laterality Date  . ESOPHAGOGASTRODUODENOSCOPY  08/2018   esophagus dilated, biopsy showed chronic gastritis with mod reflux changes [facility])  . NASAL RECONSTRUCTION WITH SEPTAL REPAIR  2017   [facility]  . OTHER SURGICAL HISTORY  1992   facial surgery  . ROTATOR CUFF REPAIR  2009   left  . SINOSCOPY    . UMBILICAL HERNIA REPAIR  05/10/2012   Procedure: HERNIA REPAIR UMBILICAL ADULT;  Surgeon: Harl Bowie, MD;  Location: [facility];  Service: General;  Laterality: N/A;       Family History  Problem Relation Age of Onset  . Asthma Father   . Arthritis Father        father, brother, Pgrandfather  . Chronic bronchitis Father   . Allergies Father   . Hypertension Brother        obese  . CAD Paternal Grandfather        MI x2  . Multiple sclerosis Brother   . Stroke Neg Hx   . Cancer Neg Hx  Social History   Tobacco Use  . Smoking status: Former Smoker    Packs/day: 1.00    Years: 13.00    Pack years: 13.00    Types: Cigarettes    Quit date: 12/21/1999    Years since quitting: 20.3  . Smokeless tobacco: Former Systems developer    Types: Snuff    Quit date: 12/20/2013  Substance Use Topics  . Alcohol use: No    Alcohol/week: 0.0 standard drinks    Comment: quit 2002  . Drug use: Not Currently    Types: Marijuana    Comment: quit 2002    Home Medications Prior to Admission medications   Medication Sig Start Date End Date Taking? Authorizing Provider  albuterol (VENTOLIN HFA) 108 (90 Base) MCG/ACT inhaler Inhale 2 puffs into the lungs every 4 (four)  hours as needed for wheezing or shortness of breath. 03/30/17   Parrett, Fonnie Mu, NP  amLODipine (NORVASC) 10 MG tablet Take 1 tablet (10 mg total) by mouth daily. 03/14/20   Ria Bush, MD  cetirizine (ZYRTEC) 10 MG tablet Take 10 mg by mouth at bedtime. As needed for seasonal allergies    [provider]  diclofenac sodium (VOLTAREN) 1 % GEL Apply 2 g topically 4 (four) times daily. Patient taking differently: Apply 2 g topically 4 (four) times daily as needed. As needed 12/25/18   Copland, Frederico Hamman, MD  meloxicam (MOBIC) 15 MG tablet TAKE 1 TABLET BY MOUTH EVERY DAY Patient taking differently: As needed 11/23/19   Edrick Kins, DPM  metoprolol succinate (TOPROL-XL) 50 MG 24 hr tablet Take 1 tablet (50 mg total) by mouth daily. Take with or immediately following a meal. 03/14/20   Ria Bush, MD  Multiple Vitamin (MULTIVITAMIN WITH MINERALS) TABS tablet Take 1 tablet by mouth daily.    [provider]  omega-3 acid ethyl esters (LOVAZA) 1 g capsule Take 1 g by mouth daily.    [provider]  pantoprazole (PROTONIX) 40 MG tablet Take 1 tablet by mouth 2 (two) times daily. 09/06/19 09/05/20  [provider]    Allergies    Hctz [hydrochlorothiazide]  Review of Systems   Review of Systems  Constitutional: Negative for chills and fever.  Respiratory: Negative for shortness of breath.   Cardiovascular: Negative for chest pain.  Gastrointestinal: Positive for abdominal pain and nausea. Negative for anal bleeding, blood in stool, constipation and vomiting.  Genitourinary: Positive for dysuria, flank pain and frequency. Negative for scrotal swelling and testicular pain.  All other systems reviewed and are negative.   Physical Exam Updated Vital Signs BP (!) 145/90 (BP Location: Right Arm)   Pulse 80   Temp 98.9 F (37.2 C) (Oral)   Resp 16   Ht 5' 10"  (1.778 m)   Wt 108.9 kg   SpO2 99%   BMI 34.44 kg/m   Physical Exam Vitals and nursing  note reviewed.  Constitutional:      General: He is not in acute distress.    Appearance: He is well-developed. He is not toxic-appearing.  HENT:     Head: Normocephalic and atraumatic.  Eyes:     General:        Right eye: No discharge.        Left eye: No discharge.     Conjunctiva/sclera: Conjunctivae normal.  Cardiovascular:     Rate and Rhythm: Normal rate and regular rhythm.  Pulmonary:     Effort: Pulmonary effort is normal. No respiratory distress.  Breath sounds: Normal breath sounds. No wheezing, rhonchi or rales.  Abdominal:     General: There is no distension.     Palpations: Abdomen is soft.     Tenderness: There is abdominal tenderness (lower abdomen). There is no right CVA tenderness, left CVA tenderness, guarding or rebound.  Musculoskeletal:     Cervical back: Neck supple.  Skin:    General: Skin is warm and dry.     Findings: No rash.  Neurological:     Mental Status: He is alert.     Comments: Clear speech.   Psychiatric:        Behavior: Behavior normal.     ED Results / Procedures / Treatments   Labs (all labs ordered are listed, but only abnormal results are displayed) Labs Reviewed  COMPREHENSIVE METABOLIC PANEL - Abnormal; Notable for the following components:      Result Value   Sodium 133 (*)    Glucose, Bld 132 (*)    Calcium 8.8 (*)    Total Bilirubin 1.8 (*)    All other components within normal limits  CBC WITH DIFFERENTIAL/PLATELET - Abnormal; Notable for the following components:   WBC 15.9 (*)    Neutro Abs 12.8 (*)    Monocytes Absolute 1.1 (*)    Abs Immature Granulocytes 0.09 (*)    All other components within normal limits  URINE CULTURE  RESPIRATORY PANEL BY RT PCR (FLU A&B, COVID)  URINALYSIS, ROUTINE W REFLEX MICROSCOPIC    EKG None  Radiology CT Renal Stone Study  Result Date: 04/13/2020 CLINICAL DATA:  Abdominal pain unspecified. EXAM: CT ABDOMEN AND PELVIS WITHOUT CONTRAST TECHNIQUE: Multidetector CT imaging of  the abdomen and pelvis was performed following the standard protocol without IV contrast. COMPARISON:  09/06/2019 FINDINGS: Lower chest: Incidental imaging of the lung bases is unremarkable. Hepatobiliary: Moderate to severe hepatic steatosis. No focal lesion. Lobular hepatic contours. No pericholecystic stranding. No biliary ductal dilation. Pancreas: Pancreas is normal. No signs ductal dilation or inflammation. Spleen: Spleen normal size without focal lesion. Adrenals/Urinary Tract: Adrenal glands are normal. Renal contours are smooth. No hydronephrosis. No nephrolithiasis. Urinary bladder is normal. Stomach/Bowel: Small bowel with normal appearance, stomach with normal appearance. Appendix is normal. Focal perforation, associated with small diverticulum in the mid sigmoid colon in the LEFT lower quadrant. Associated colonic thickening. Minimal diverticulosis elsewhere. Locules of extraluminal gas track into the LEFT retroperitoneum and sigmoid mesentery. There is fascial thickening along the LEFT hemipelvis. No abscess. No gas in the peritoneum proper. No omental stranding. Vascular/Lymphatic: Calcified atheromatous plaque minimal in the abdominal aorta. No evidence of aneurysmal dilation. No adenopathy. No sign of pelvic lymphadenopathy. Prostate with calcifications. Reproductive: As above Other: Moderate fat containing inguinal hernias. Small fat containing umbilical hernia. Musculoskeletal: No acute musculoskeletal process. IMPRESSION: 1. Focal perforation, associated with small diverticulum in the mid sigmoid colon with locules of extraluminal gas track into the LEFT retroperitoneum and sigmoid mesentery. No abscess. No generalized peritoneal stranding at this time. 2. Moderate to severe hepatic steatosis. Lobular hepatic contours, correlate with any clinical or laboratory evidence of liver disease. 3. Moderate fat containing inguinal hernias and small fat containing umbilical hernia. 4. Aortic  atherosclerosis. Critical Value/emergent results were called by telephone at the time of interpretation on 04/13/2020 at 11:05 am to provider [facility] , who verbally acknowledged these results. Aortic Atherosclerosis (ICD10-I70.0). Electronically Signed   By: Zetta Bills M.D.   On: 04/13/2020 11:06    Procedures .Critical Care Performed by:  Oliveah Zwack, Glynda Jaeger, PA-C Authorized by: Amaryllis Dyke, PA-C    CRITICAL CARE Performed by: Kennith Maes   Total critical care time: 35 minutes  Critical care time was exclusive of separately billable procedures and treating other patients.  Critical care was necessary to treat or prevent imminent or life-threatening deterioration.  Critical care was time spent personally by me on the following activities: development of treatment plan with patient and/or surrogate as well as nursing, discussions with consultants, evaluation of patient's response to treatment, examination of patient, obtaining history from patient or surrogate, ordering and performing treatments and interventions, ordering and review of laboratory studies, ordering and review of radiographic studies, pulse oximetry and re-evaluation of patient's condition.     (including critical care time)  Medications Ordered in ED Medications  piperacillin-tazobactam (ZOSYN) IVPB 3.375 g (3.375 g Intravenous New Bag/Given 04/13/20 1119)  sodium chloride 0.9 % bolus 1,000 mL (1,000 mLs Intravenous New Bag/Given 04/13/20 1011)  ondansetron (ZOFRAN) injection 4 mg (4 mg Intravenous Given 04/13/20 1013)  morphine 4 MG/ML injection 4 mg (4 mg Intravenous Given 04/13/20 1013)  HYDROmorphone (DILAUDID) injection 1 mg (1 mg Intravenous Given 04/13/20 1046)    ED Course  I have reviewed the triage vital signs and the nursing notes.  Pertinent labs & imaging results that were available during my care of the patient were reviewed by me and considered in my medical decision  making (see chart for details).    Jordan Buchanan was evaluated in Emergency Department on 04/13/2020 for the symptoms described in the history of present illness. He/she was evaluated in the context of the global COVID-19 pandemic, which necessitated consideration that the patient might be at risk for infection with the SARS-CoV-2 virus that causes COVID-19. Institutional protocols and algorithms that pertain to the evaluation of patients at risk for COVID-19 are in a state of rapid change based on information released by regulatory bodies including the CDC and federal and state organizations. These policies and algorithms were followed during the patient's care in the ED.  MDM Rules/Calculators/A&P                      Patient presents to the ED with complaints of abdominal pain. Nontoxic, vitals WNL with the exception of mildly elevated BP- doubt HTN emergency. On exam patient is tender to palpation to the lower diffuse lower abdomen. DDx: Appendicitis, nephrolithiasis, perf, obstruction, pyelonephritis, constipation, MSK.   Additional history obtained:  Additional history obtained from nursing note & chart review.   Lab Tests:  I Ordered, reviewed, and interpreted labs, which included:  CBC: Leukocytosis @ 15.9 with left shift. No anemia.  CMP: Mildly elevated T bili & glucose, mild hypocalcemia, otherwise WNL UA: Negative COVID/influenza testing: Negative  Imaging Studies ordered:  I ordered imaging studies which included CT renal stone study, I independently visualized and interpreted imaging & agree with radiology interpretation as detailed above & below.   Medicines ordered, Re-evaluations, Consultation:  I ordered medication Morphine, zofran, & fluids for symptomatic control.   10:38: RE-EVAL: Patient with continued pain, no relief with morphine. Dilaudid ordered.    10:56: CONSULT: Discussed case with radiologist Dr. Jacalyn Lefevre- patient has a focal perforation associated with small  diverticulum in the mid sigmoid colon, extraluminal gas present into the left retroperitoneum and sigmoid mesentery. No abscess. Recommends general surgery consultation.   Zosyn ordered. Consult placed to general surgery.   11:00 Patient & his wife updated @ bedside, pain improved, states  he does not need any further pain medication at this time.   11:13: CONSULT: Discussed with general surgeon Dr. Lucia Gaskins, recommends medicine admission, will see in consultation.  11:33: CONSULT: Discussed with hospitalist Dr. Starla Link- accepts admission.  12:58: Transport at bedside.   Findings and plan of care discussed with supervising physician Dr. Billy Fischer who is in agreement.   Portions of this note were generated with [employer]. Dictation errors may occur despite best attempts at proofreading.  Final Clinical Impression(s) / ED Diagnoses Final diagnoses:  Perforated diverticulum    Rx / DC Orders ED Discharge Orders    None       Amaryllis Dyke, PA-C 04/13/20 1301    Gareth Morgan, MD 04/13/20 2313

## 2020-04-14 DIAGNOSIS — K5720 Diverticulitis of large intestine with perforation and abscess without bleeding: Secondary | ICD-10-CM | POA: Diagnosis not present

## 2020-04-14 DIAGNOSIS — K5780 Diverticulitis of intestine, part unspecified, with perforation and abscess without bleeding: Secondary | ICD-10-CM

## 2020-04-14 LAB — COMPREHENSIVE METABOLIC PANEL
ALT: 31 U/L (ref 0–44)
AST: 23 U/L (ref 15–41)
Albumin: 3.8 g/dL (ref 3.5–5.0)
Alkaline Phosphatase: 60 U/L (ref 38–126)
Anion gap: 9 (ref 5–15)
BUN: 8 mg/dL (ref 6–20)
CO2: 24 mmol/L (ref 22–32)
Calcium: 8.5 mg/dL — ABNORMAL LOW (ref 8.9–10.3)
Chloride: 102 mmol/L (ref 98–111)
Creatinine, Ser: 0.92 mg/dL (ref 0.61–1.24)
GFR calc Af Amer: >60 mL/min (ref >60)
GFR calc non Af Amer: >60 mL/min (ref >60)
Glucose, Bld: 90 mg/dL (ref 70–99)
Potassium: 3.8 mmol/L (ref 3.5–5.1)
Sodium: 135 mmol/L (ref 135–145)
Total Bilirubin: 2.9 mg/dL — ABNORMAL HIGH (ref 0.3–1.2)
Total Protein: 6.9 g/dL (ref 6.5–8.1)

## 2020-04-14 LAB — CBC
HCT: 45 % (ref 39.0–52.0)
Hemoglobin: 15.4 g/dL (ref 13.0–17.0)
MCH: 30.9 pg (ref 26.0–34.0)
MCHC: 34.2 g/dL (ref 30.0–36.0)
MCV: 90.2 fL (ref 80.0–100.0)
Platelets: 258 10*3/uL (ref 150–400)
RBC: 4.99 MIL/uL (ref 4.22–5.81)
RDW: 12.8 % (ref 11.5–15.5)
WBC: 16.6 10*3/uL — ABNORMAL HIGH (ref 4.0–10.5)
nRBC: 0 % (ref 0.0–0.2)

## 2020-04-14 LAB — C-REACTIVE PROTEIN: CRP: 15.7 mg/dL — ABNORMAL HIGH (ref <1.0)

## 2020-04-14 LAB — URINE CULTURE: Culture: NO GROWTH

## 2020-04-14 LAB — HIV ANTIBODY (ROUTINE TESTING W REFLEX): HIV Screen 4th Generation wRfx: NONREACTIVE

## 2020-04-14 MED ORDER — ADULT MULTIVITAMIN W/MINERALS CH
1.0000 | ORAL_TABLET | Freq: Every day | ORAL | Status: DC
  Administered 2020-04-14 – 2020-04-19 (×3): 1 via ORAL
  Filled 2020-04-14 (×5): qty 1

## 2020-04-14 MED ORDER — OMEGA-3-ACID ETHYL ESTERS 1 G PO CAPS
1.0000 g | ORAL_CAPSULE | Freq: Every day | ORAL | Status: DC
  Administered 2020-04-14 – 2020-04-19 (×4): 1 g via ORAL
  Filled 2020-04-14 (×6): qty 1

## 2020-04-14 MED ORDER — METOPROLOL SUCCINATE ER 50 MG PO TB24
50.0000 mg | ORAL_TABLET | Freq: Every day | ORAL | Status: DC
  Administered 2020-04-14 – 2020-04-19 (×6): 50 mg via ORAL
  Filled 2020-04-14 (×6): qty 1

## 2020-04-14 MED ORDER — PANTOPRAZOLE SODIUM 40 MG PO TBEC
40.0000 mg | DELAYED_RELEASE_TABLET | Freq: Two times a day (BID) | ORAL | Status: DC
  Administered 2020-04-14 – 2020-04-19 (×11): 40 mg via ORAL
  Filled 2020-04-14 (×11): qty 1

## 2020-04-14 MED ORDER — LORATADINE 10 MG PO TABS
10.0000 mg | ORAL_TABLET | Freq: Every day | ORAL | Status: DC
  Administered 2020-04-14 – 2020-04-19 (×6): 10 mg via ORAL
  Filled 2020-04-14 (×6): qty 1

## 2020-04-14 MED ORDER — AMLODIPINE BESYLATE 10 MG PO TABS
10.0000 mg | ORAL_TABLET | Freq: Every day | ORAL | Status: DC
  Administered 2020-04-14 – 2020-04-19 (×6): 10 mg via ORAL
  Filled 2020-04-14 (×6): qty 1

## 2020-04-14 NOTE — Progress Notes (Signed)
PROGRESS NOTE    Jordan Buchanan  [id number] DOB: 1971/08/05 DOA: 04/13/2020 PCP: Ria Bush, MD   Brief Narrative:Jordan Buchanan is a 49 y.o. male with medical history significant of hypertension, obesity, GERD, migraine, nephrolithiasis, prior abdominal surgeries including umbilical hernia repair presented to [place] with abdominal pain that started.  Pain started in the right flank area and then seemed to move to the diffuse lower abdomen, right more than left.  Pain was constant, pressure-like, worse with movement/position changes with no relieving factors.  He also complained of nausea, dysuria and increased frequency of urination.  He denies fever, vomiting, black or bloody stools, chest pain, shortness of breath, palpitations or loss of consciousness.  ED Course: He was found to have leukocytosis and CT of the abdomen pelvis showed focal perforation in the mid sigmoid with no abscess.  General surgery was consulted who recommended admission under hospitalist service and they will consult.  Hospitalist service was asked to evaluate the patient.  Patient was transferred to [facility].  Assessment & Plan:   Active Problems:   Diverticulitis of colon with perforation   #1 acute sigmoid diverticulitis -still having abdominal pain. Npo ,ivf pain control Continue Zosyn Diet advancement per surgery CT abdomenFocal perforation, associated with small diverticulum in the mid sigmoid colon with locules of extraluminal gas track into the LEFT retroperitoneum and sigmoid mesentery. No abscess. No generalized peritoneal stranding at this time.  Moderate to severe hepatic steatosis. Lobular hepatic contours, correlate with any clinical or laboratory evidence of liver disease.  Moderate fat containing inguinal hernias and small fat containing umbilical hernia.   #2 HTN on norvasc 10 mg qd and metoprolol 50 mg daily. Blood pressure 129/79.  #3 gerd continue  Protonix  Estimated body mass index is 34.44 kg/m as calculated from the following:   Height as of this encounter: 5\' 10"  (1.778 m).   Weight as of this encounter: 108.9 kg.  DVT prophylaxis: Lovenox Code Status: Full code Family Communication: Discussed with wife at bedside Disposition Plan: Patient came from home Plan to discharge back home when he is medically stable Admitted with acute diverticulitis and focal perforation Barriers to discharge patient with acute diverticulitis n.p.o. IV fluids on antibiotics also needs surgical  clearance for discharge Consultants: ccs   Procedures:none Antimicrobials:zosyn  Subjective: Patient is resting in bed complaining of abdominal fullness and tenderness and pain pain increased with walking   Objective: Vitals:   04/14/20 0507 04/14/20 1020 04/14/20 1042 04/14/20 1322  BP: 125/79 132/88  129/79  Pulse: 91 82  83  Resp: 20 18  16   Temp: 99.7 F (37.6 C) 98 F (36.7 C) 98.4 F (36.9 C) 100 F (37.8 C)  TempSrc:   Oral   SpO2: 95% 96%  95%  Weight:      Height:        Intake/Output Summary (Last 24 hours) at 04/14/2020 1434 Last data filed at 04/14/2020 1400 Gross per 24 hour  Intake 1394.38 ml  Output --  Net 1394.38 ml   Filed Weights   04/13/20 0954  Weight: 108.9 kg    Examination:  General exam: Appears calm and comfortable  Respiratory system: Clear to auscultation. Respiratory effort normal. Cardiovascular system: S1 & S2 heard, RRR. No JVD, murmurs, rubs, gallops or clicks. No pedal edema. Gastrointestinal system: Abdomen is nondistended, soft and tender. No organomegaly or masses felt. Normal bowel sounds heard. Central nervous system: Alert and oriented. No focal neurological deficits. Extremities: Symmetric  5 x 5 power. Skin: No rashes, lesions or ulcers Psychiatry: Judgement and insight appear normal. Mood & affect appropriate.     Data Reviewed: I have personally reviewed following labs and imaging  studies  CBC: Recent Labs  Lab 04/13/20 1003 04/14/20 0336  WBC 15.9* 16.6*  NEUTROABS 12.8*  --   HGB 16.5 15.4  HCT 46.1 45.0  MCV 85.7 90.2  PLT 276 0000000   Basic Metabolic Panel: Recent Labs  Lab 04/13/20 1003 04/14/20 0336  NA 133* 135  K 3.8 3.8  CL 101 102  CO2 25 24  GLUCOSE 132* 90  BUN 7 8  CREATININE 0.94 0.92  CALCIUM 8.8* 8.5*   GFR: Estimated Creatinine Clearance: 124 mL/min (by C-G formula based on SCr of 0.92 mg/dL). Liver Function Tests: Recent Labs  Lab 04/13/20 1003 04/14/20 0336  AST 20 23  ALT 31 31  ALKPHOS 73 60  BILITOT 1.8* 2.9*  PROT 7.4 6.9  ALBUMIN 4.2 3.8   No results for input(s): LIPASE, AMYLASE in the last 168 hours. No results for input(s): AMMONIA in the last 168 hours. Coagulation Profile: No results for input(s): INR, PROTIME in the last 168 hours. Cardiac Enzymes: No results for input(s): CKTOTAL, CKMB, CKMBINDEX, TROPONINI in the last 168 hours. BNP (last 3 results) No results for input(s): PROBNP in the last 8760 hours. HbA1C: No results for input(s): HGBA1C in the last 72 hours. CBG: No results for input(s): GLUCAP in the last 168 hours. Lipid Profile: No results for input(s): CHOL, HDL, LDLCALC, TRIG, CHOLHDL, LDLDIRECT in the last 72 hours. Thyroid Function Tests: No results for input(s): TSH, T4TOTAL, FREET4, T3FREE, THYROIDAB in the last 72 hours. Anemia Panel: No results for input(s): VITAMINB12, FOLATE, FERRITIN, TIBC, IRON, RETICCTPCT in the last 72 hours. Sepsis Labs: No results for input(s): PROCALCITON, LATICACIDVEN in the last 168 hours.  Recent Results (from the past 240 hour(s))  Urine culture     Status: None   Collection Time: 04/13/20 11:25 AM   Specimen: Urine, Random  Result Value Ref Range Status   Specimen Description   Final    URINE, RANDOM Performed at [facility], [address] [place], [place] [address]    Special Requests   Final    NONE Performed at [facility], [address] [place], [place] [address]    Culture   Final    NO GROWTH Performed at [facility], [address] [place], [place] [address]    Report Status 04/14/2020 FINAL  Final  Respiratory Panel by RT PCR (Flu A&B, Covid) - Urine, Clean Catch     Status: None   Collection Time: 04/13/20 11:28 AM   Specimen: Urine, Clean Catch  Result Value Ref Range Status   SARS Coronavirus 2 by RT PCR NEGATIVE NEGATIVE Final    Comment: (NOTE) SARS-CoV-2 target nucleic acids are NOT DETECTED. The SARS-CoV-2 RNA is generally detectable in upper respiratoy specimens during the acute phase of infection. The lowest concentration of SARS-CoV-2 viral copies this assay can detect is 131 copies/mL. A negative result does not preclude SARS-Cov-2 infection and should not be used as the sole basis for treatment or other patient management decisions. A negative result may occur with  improper specimen collection/handling, submission of specimen other than nasopharyngeal swab, presence of viral mutation(s) within the areas targeted by this assay, and inadequate number of viral copies (<131 copies/mL). A negative result must be combined with  clinical observations, patient history, and epidemiological information. The expected result is Negative. Fact Sheet for Patients:  PinkCheek.be Fact Sheet for Healthcare Providers:  GravelBags.it This test is not yet ap proved or cleared by the [place] FDA and  has been authorized for detection and/or diagnosis of SARS-CoV-2 by FDA under an Emergency Use Authorization (EUA). This EUA will remain  in effect (meaning this test can be used) for the duration of the COVID-19 declaration under Section 564(b)(1) of the Act, 21 U.S.C. section 360bbb-3(b)(1), unless the authorization is terminated or revoked sooner.    Influenza A by PCR NEGATIVE NEGATIVE Final   Influenza B by PCR NEGATIVE  NEGATIVE Final    Comment: (NOTE) The Xpert Xpress SARS-CoV-2/FLU/RSV assay is intended as an aid in  the diagnosis of influenza from Nasopharyngeal swab specimens and  should not be used as a sole basis for treatment. Nasal washings and  aspirates are unacceptable for Xpert Xpress SARS-CoV-2/FLU/RSV  testing. Fact Sheet for Patients: PinkCheek.be Fact Sheet for Healthcare Providers: GravelBags.it This test is not yet approved or cleared by the [place] FDA and  has been authorized for detection and/or diagnosis of SARS-CoV-2 by  FDA under an Emergency Use Authorization (EUA). This EUA will remain  in effect (meaning this test can be used) for the duration of the  Covid-19 declaration under Section 564(b)(1) of the Act, 21  U.S.C. section 360bbb-3(b)(1), unless the authorization is  terminated or revoked. Performed at [facility], [address], [place], [place] [address]          Radiology Studies: CT Renal Stone Study  Result Date: 04/13/2020 CLINICAL DATA:  Abdominal pain unspecified. EXAM: CT ABDOMEN AND PELVIS WITHOUT CONTRAST TECHNIQUE: Multidetector CT imaging of the abdomen and pelvis was performed following the standard protocol without IV contrast. COMPARISON:  09/06/2019 FINDINGS: Lower chest: Incidental imaging of the lung bases is unremarkable. Hepatobiliary: Moderate to severe hepatic steatosis. No focal lesion. Lobular hepatic contours. No pericholecystic stranding. No biliary ductal dilation. Pancreas: Pancreas is normal. No signs ductal dilation or inflammation. Spleen: Spleen normal size without focal lesion. Adrenals/Urinary Tract: Adrenal glands are normal. Renal contours are smooth. No hydronephrosis. No nephrolithiasis. Urinary bladder is normal. Stomach/Bowel: Small bowel with normal appearance, stomach with normal appearance. Appendix is normal. Focal perforation, associated with small  diverticulum in the mid sigmoid colon in the LEFT lower quadrant. Associated colonic thickening. Minimal diverticulosis elsewhere. Locules of extraluminal gas track into the LEFT retroperitoneum and sigmoid mesentery. There is fascial thickening along the LEFT hemipelvis. No abscess. No gas in the peritoneum proper. No omental stranding. Vascular/Lymphatic: Calcified atheromatous plaque minimal in the abdominal aorta. No evidence of aneurysmal dilation. No adenopathy. No sign of pelvic lymphadenopathy. Prostate with calcifications. Reproductive: As above Other: Moderate fat containing inguinal hernias. Small fat containing umbilical hernia. Musculoskeletal: No acute musculoskeletal process. IMPRESSION: 1. Focal perforation, associated with small diverticulum in the mid sigmoid colon with locules of extraluminal gas track into the LEFT retroperitoneum and sigmoid mesentery. No abscess. No generalized peritoneal stranding at this time. 2. Moderate to severe hepatic steatosis. Lobular hepatic contours, correlate with any clinical or laboratory evidence of liver disease. 3. Moderate fat containing inguinal hernias and small fat containing umbilical hernia. 4. Aortic atherosclerosis. Critical Value/emergent results were called by telephone at the time of interpretation on 04/13/2020 at 11:05 am to provider [facility] , who verbally acknowledged these results. Aortic Atherosclerosis (ICD10-I70.0). Electronically Signed   By: Jewel Baize.D.  On: 04/13/2020 11:06        Scheduled Meds: . amLODipine  10 mg Oral Daily  . loratadine  10 mg Oral Daily  . metoprolol succinate  50 mg Oral Daily  . multivitamin with minerals  1 tablet Oral Daily  . omega-3 acid ethyl esters  1 g Oral Daily  . pantoprazole  40 mg Oral BID   Continuous Infusions: . sodium chloride 100 mL/hr at 04/14/20 0541  . piperacillin-tazobactam (ZOSYN)  IV 3.375 g (04/14/20 0819)     LOS: 1 day     Georgette Shell,  MD 04/14/2020, 2:34 PM

## 2020-04-14 NOTE — Progress Notes (Signed)
[facility] Progress Note     Subjective: CC:  Endorses lower abdominal pain, improved compared to yesterday. Also reports a fever of 101.9 overnight that I was not documented by nursing. Denies nausea, vomiting, or urinary sxs. Last reported BM was early yesterday and was small, non-bloody. Confirms that this is his first episode of diverticulitis.   Wife, Jordan Buchanan, at bedside. Objective: Vital signs in last 24 hours: Temp:  [98.2 F (36.8 C)-99.9 F (37.7 C)] 99.7 F (37.6 C) (04/26 0507) Pulse Rate:  [80-91] 91 (04/26 0507) Resp:  [16-20] 20 (04/26 0507) BP: (119-142)/(65-94) 125/79 (04/26 0507) SpO2:  [93 %-97 %] 95 % (04/26 0507) Last BM Date: 04/13/20  Intake/Output from previous day: 04/25 0701 - 04/26 0700 In: 1014.4 [P.O.:120; I.V.:829.2; IV Piggyback:65.1] Out: 420 [Urine:420] Intake/Output this shift: No intake/output data recorded.  PE: Gen:  Alert, NAD, pleasant Card:  Regular rate and rhythm, pedal pulses 2+ BL Pulm:  Normal effort, clear to auscultation bilaterally Abd: Soft, non-distended, TTP lower abdomen with guarding, no peritonitis, no HSM Skin: warm and dry, no rashes  Psych: A&Ox3   Lab Results:  Recent Labs    04/13/20 1003 04/14/20 0336  WBC 15.9* 16.6*  HGB 16.5 15.4  HCT 46.1 45.0  PLT 276 258   BMET Recent Labs    04/13/20 1003 04/14/20 0336  NA 133* 135  K 3.8 3.8  CL 101 102  CO2 25 24  GLUCOSE 132* 90  BUN 7 8  CREATININE 0.94 0.92  CALCIUM 8.8* 8.5*   PT/INR No results for input(s): LABPROT, INR in the last 72 hours. CMP     Component Value Date/Time   NA 135 04/14/2020 0336   K 3.8 04/14/2020 0336   CL 102 04/14/2020 0336   CO2 24 04/14/2020 0336   GLUCOSE 90 04/14/2020 0336   BUN 8 04/14/2020 0336   CREATININE 0.92 04/14/2020 0336   CALCIUM 8.5 (L) 04/14/2020 0336   PROT 6.9 04/14/2020 0336   ALBUMIN 3.8 04/14/2020 0336   AST 23 04/14/2020 0336   ALT 31 04/14/2020 0336   ALKPHOS 60 04/14/2020  0336   BILITOT 2.9 (H) 04/14/2020 0336   GFRNONAA >60 04/14/2020 0336   GFRAA >60 04/14/2020 0336   Lipase     Component Value Date/Time   LIPASE 35 06/18/2019 0218       Studies/Results: CT Renal Stone Study  Result Date: 04/13/2020 CLINICAL DATA:  Abdominal pain unspecified. EXAM: CT ABDOMEN AND PELVIS WITHOUT CONTRAST TECHNIQUE: Multidetector CT imaging of the abdomen and pelvis was performed following the standard protocol without IV contrast. COMPARISON:  09/06/2019 FINDINGS: Lower chest: Incidental imaging of the lung bases is unremarkable. Hepatobiliary: Moderate to severe hepatic steatosis. No focal lesion. Lobular hepatic contours. No pericholecystic stranding. No biliary ductal dilation. Pancreas: Pancreas is normal. No signs ductal dilation or inflammation. Spleen: Spleen normal size without focal lesion. Adrenals/Urinary Tract: Adrenal glands are normal. Renal contours are smooth. No hydronephrosis. No nephrolithiasis. Urinary bladder is normal. Stomach/Bowel: Small bowel with normal appearance, stomach with normal appearance. Appendix is normal. Focal perforation, associated with small diverticulum in the mid sigmoid colon in the LEFT lower quadrant. Associated colonic thickening. Minimal diverticulosis elsewhere. Locules of extraluminal gas track into the LEFT retroperitoneum and sigmoid mesentery. There is fascial thickening along the LEFT hemipelvis. No abscess. No gas in the peritoneum proper. No omental stranding. Vascular/Lymphatic: Calcified atheromatous plaque minimal in the abdominal aorta. No evidence of aneurysmal dilation. No adenopathy. No sign of  pelvic lymphadenopathy. Prostate with calcifications. Reproductive: As above Other: Moderate fat containing inguinal hernias. Small fat containing umbilical hernia. Musculoskeletal: No acute musculoskeletal process. IMPRESSION: 1. Focal perforation, associated with small diverticulum in the mid sigmoid colon with locules of  extraluminal gas track into the LEFT retroperitoneum and sigmoid mesentery. No abscess. No generalized peritoneal stranding at this time. 2. Moderate to severe hepatic steatosis. Lobular hepatic contours, correlate with any clinical or laboratory evidence of liver disease. 3. Moderate fat containing inguinal hernias and small fat containing umbilical hernia. 4. Aortic atherosclerosis. Critical Value/emergent results were called by telephone at the time of interpretation on 04/13/2020 at 11:05 am to provider [facility] , who verbally acknowledged these results. Aortic Atherosclerosis (ICD10-I70.0). Electronically Signed   By: Zetta Bills M.D.   On: 04/13/2020 11:06    Anti-infectives: Anti-infectives (From admission, onward)   Start     Dose/Rate Route Frequency Ordered Stop   04/13/20 1700  piperacillin-tazobactam (ZOSYN) IVPB 3.375 g     3.375 g 12.5 mL/hr over 240 Minutes Intravenous Every 8 hours 04/13/20 1214     04/13/20 1115  piperacillin-tazobactam (ZOSYN) IVPB 3.375 g     3.375 g 100 mL/hr over 30 Minutes Intravenous  Once 04/13/20 1105 04/13/20 1130       Assessment/Plan HTN GERD Nephrolithiasis OSA  Possible bilateral inguinal hernias - pt asymptomatic, seen on CT  Sigmoid diverticulitis with focal air in mesentery, no abscess - AFVSS, WBC 16.6 from 15.9 - given reported fever, leukocytosis, and pain/guarding would continue NPO with ice chips - IV Zosyn  - PRN analgesic and antiemetics   FEN: NPO, ice chips  ID: Zosyn VTE: SCD's, recommend starting chemical VTE ppx    LOS: 1 day    [name], [facility]

## 2020-04-15 ENCOUNTER — Inpatient Hospital Stay (HOSPITAL_COMMUNITY): Payer: BC Managed Care – PPO

## 2020-04-15 DIAGNOSIS — K5780 Diverticulitis of intestine, part unspecified, with perforation and abscess without bleeding: Secondary | ICD-10-CM | POA: Diagnosis not present

## 2020-04-15 DIAGNOSIS — K5720 Diverticulitis of large intestine with perforation and abscess without bleeding: Secondary | ICD-10-CM | POA: Diagnosis not present

## 2020-04-15 DIAGNOSIS — K5732 Diverticulitis of large intestine without perforation or abscess without bleeding: Secondary | ICD-10-CM | POA: Diagnosis not present

## 2020-04-15 LAB — CBC
HCT: 42.1 % (ref 39.0–52.0)
Hemoglobin: 14.2 g/dL (ref 13.0–17.0)
MCH: 30.1 pg (ref 26.0–34.0)
MCHC: 33.7 g/dL (ref 30.0–36.0)
MCV: 89.2 fL (ref 80.0–100.0)
Platelets: 227 10*3/uL (ref 150–400)
RBC: 4.72 MIL/uL (ref 4.22–5.81)
RDW: 12.2 % (ref 11.5–15.5)
WBC: 12.3 10*3/uL — ABNORMAL HIGH (ref 4.0–10.5)
nRBC: 0 % (ref 0.0–0.2)

## 2020-04-15 MED ORDER — HYDROMORPHONE HCL 1 MG/ML IJ SOLN
0.5000 mg | INTRAMUSCULAR | Status: DC | PRN
  Administered 2020-04-15 – 2020-04-16 (×5): 1 mg via INTRAVENOUS
  Administered 2020-04-18 (×2): 0.5 mg via INTRAVENOUS
  Filled 2020-04-15 (×8): qty 1

## 2020-04-15 MED ORDER — ALUM & MAG HYDROXIDE-SIMETH 200-200-20 MG/5ML PO SUSP
30.0000 mL | ORAL | Status: AC | PRN
  Administered 2020-04-15 – 2020-04-16 (×3): 30 mL via ORAL
  Filled 2020-04-15 (×3): qty 30

## 2020-04-15 MED ORDER — ENOXAPARIN SODIUM 60 MG/0.6ML ~~LOC~~ SOLN
50.0000 mg | SUBCUTANEOUS | Status: DC
  Administered 2020-04-15 – 2020-04-18 (×4): 50 mg via SUBCUTANEOUS
  Filled 2020-04-15 (×4): qty 0.6

## 2020-04-15 MED ORDER — ACETAMINOPHEN 325 MG PO TABS: 650.0000 mg | ORAL_TABLET | Freq: Four times a day (QID) | ORAL | Status: DC | PRN

## 2020-04-15 MED ORDER — HYDROMORPHONE HCL 1 MG/ML IJ SOLN
1.0000 mg | Freq: Once | INTRAMUSCULAR | Status: AC
  Administered 2020-04-15: 1 mg via INTRAVENOUS

## 2020-04-15 MED ORDER — ACETAMINOPHEN 325 MG PO TABS
650.0000 mg | ORAL_TABLET | Freq: Four times a day (QID) | ORAL | Status: DC
  Administered 2020-04-15 – 2020-04-16 (×3): 650 mg via ORAL
  Filled 2020-04-15 (×3): qty 2

## 2020-04-15 NOTE — Progress Notes (Signed)
Pt reported increased pain after eating a clear liquid meal.  Dr Rodena Piety and [place] notified.

## 2020-04-15 NOTE — Progress Notes (Signed)
[facility] Progress Note     Subjective: CC:  Pain continues to improve, mild pain currently after walking 2 laps. Reports having a small, formed, non-bloody stool yesterday. Denies fever, chills, nausea, or vomiting.   Wife at bedside. WBC 12.3 from 16 Objective: Vital signs in last 24 hours: Temp:  [98.4 F (36.9 C)-100 F (37.8 C)] 98.8 F (37.1 C) (04/27 0523) Pulse Rate:  [73-83] 76 (04/27 0523) Resp:  [16-18] 17 (04/27 0523) BP: (119-129)/(73-79) 121/73 (04/27 0523) SpO2:  [93 %-96 %] 96 % (04/27 0523) Last BM Date: 04/13/20  Intake/Output from previous day: 04/26 0701 - 04/27 0700 In: 1833.8 [P.O.:240; I.V.:1435.9; IV Piggyback:157.9] Out: -  Intake/Output this shift: No intake/output data recorded.  PE: Gen:  Alert, NAD, pleasant Card:  Regular rate and rhythm, pedal pulses 2+ BL Pulm:  Normal effort, clear to auscultation bilaterally Abd: Soft, non-distended, TTP lower abdomen with guarding, no peritonitis, no HSM Skin: warm and dry, no rashes  Psych: A&Ox3   Lab Results:  Recent Labs    04/14/20 0336 04/15/20 0254  WBC 16.6* 12.3*  HGB 15.4 14.2  HCT 45.0 42.1  PLT 258 227   BMET Recent Labs    04/13/20 1003 04/14/20 0336  NA 133* 135  K 3.8 3.8  CL 101 102  CO2 25 24  GLUCOSE 132* 90  BUN 7 8  CREATININE 0.94 0.92  CALCIUM 8.8* 8.5*   PT/INR No results for input(s): LABPROT, INR in the last 72 hours. CMP     Component Value Date/Time   NA 135 04/14/2020 0336   K 3.8 04/14/2020 0336   CL 102 04/14/2020 0336   CO2 24 04/14/2020 0336   GLUCOSE 90 04/14/2020 0336   BUN 8 04/14/2020 0336   CREATININE 0.92 04/14/2020 0336   CALCIUM 8.5 (L) 04/14/2020 0336   PROT 6.9 04/14/2020 0336   ALBUMIN 3.8 04/14/2020 0336   AST 23 04/14/2020 0336   ALT 31 04/14/2020 0336   ALKPHOS 60 04/14/2020 0336   BILITOT 2.9 (H) 04/14/2020 0336   GFRNONAA >60 04/14/2020 0336   GFRAA >60 04/14/2020 0336   Lipase     Component Value  Date/Time   LIPASE 35 06/18/2019 0218       Studies/Results: No results found.  Anti-infectives: Anti-infectives (From admission, onward)   Start     Dose/Rate Route Frequency Ordered Stop   04/13/20 1700  piperacillin-tazobactam (ZOSYN) IVPB 3.375 g     3.375 g 12.5 mL/hr over 240 Minutes Intravenous Every 8 hours 04/13/20 1214     04/13/20 1115  piperacillin-tazobactam (ZOSYN) IVPB 3.375 g     3.375 g 100 mL/hr over 30 Minutes Intravenous  Once 04/13/20 1105 04/13/20 1130       Assessment/Plan HTN GERD Nephrolithiasis OSA  Possible bilateral inguinal hernias - pt asymptomatic, seen on CT  Sigmoid diverticulitis with focal air in mesentery, no abscess - AFVSS, WBC 12.3 from 16 - clinically improving, advance diet to clears - IV Zosyn  - PRN analgesic and antiemetics   FEN: clear liquids, decreased IVF to 50 cc/hr  ID: Zosyn VTE: SCD's, start lovenox    LOS: 2 days    [name], [facility]

## 2020-04-15 NOTE — Progress Notes (Addendum)
PROGRESS NOTE    Jordan Buchanan  [id number] DOB: 08-May-1971 DOA: 04/13/2020 PCP: Ria Bush, MD    Chief Complaint  Patient presents with  . Abdominal Pain    Brief Narrative:Stanford Coakleyis a 49 y.o.malewith medical history significant ofhypertension, obesity, GERD, migraine, nephrolithiasis, prior abdominal surgeries including umbilical hernia repair presented to [facility] with abdominal pain that started. Pain started in the right flank area and then seemed to move to the diffuse lower abdomen, right more than left. Pain was constant, pressure-like, worse with movement/position changes with no relieving factors. He also complained of nausea, dysuria and increased frequency of urination. He denies fever, vomiting, black or bloody stools, chest pain, shortness of breath, palpitations or loss of consciousness.  ED Course:He was found to have leukocytosis and CT of the abdomen pelvis showed focal perforation in the mid sigmoid with no abscess. General surgery was consulted who recommended admission under hospitalist service and they will consult. Hospitalist service was asked to evaluate the patient. Patient was transferred to [facility].  Assessment & Plan:   Active Problems:   Diverticulitis of colon with perforation   Perforated diverticulum  #1 acute sigmoid diverticulitis -patient was started on clear liquid diet by surgery. Patient started with severe abdominal pain not responding to Dilaudid. Advised the nurse to call surgery to get recommendations may be to switch back to n.p.o. continue IV fluids. Continue Zosyn Diet advancement per surgery Patient afebrile white count 12 down from 16.  CT abdomenFocal perforation, associated with small diverticulum in the mid sigmoid colon with locules of extraluminal gas track into the LEFT retroperitoneum and sigmoid mesentery. No abscess. No generalized peritoneal stranding at this time.  Moderate  to severe hepatic steatosis. Lobular hepatic contours, correlate with any clinical or laboratory evidence of liver disease.  Moderate fat containing inguinal hernias and small fat containing umbilical hernia.   #2 HTN 128/91 on Norvasc and metoprolol daily continue the same.    #3 gerd continue Protonix   Subjective:  When I saw him earlier in the morning he was feeling better with decreased pain and ambulated in the hallway later on the nurse called me to report that on clear liquid diet his pain increased without improvement after Dilaudid. Objective: Vitals:   04/14/20 2246 04/15/20 0129 04/15/20 0523 04/15/20 1348  BP: 119/76 120/78 121/73 (!) 128/91  Pulse: 75 73 76 75  Resp: 18 18 17 19   Temp: 98.6 F (37 C) 98.5 F (36.9 C) 98.8 F (37.1 C) 98.6 F (37 C)  TempSrc: Oral Oral Oral Oral  SpO2: 93% 95% 96% 97%  Weight:      Height:        Intake/Output Summary (Last 24 hours) at 04/15/2020 1411 Last data filed at 04/15/2020 0600 Gross per 24 hour  Intake 1453.76 ml  Output --  Net 1453.76 ml   Filed Weights   04/13/20 0954  Weight: 108.9 kg    Examination:  General exam: Appears calm and comfortable  Respiratory system: Clear to auscultation. Respiratory effort normal. Cardiovascular system: S1 & S2 heard, RRR. No JVD, murmurs, rubs, gallops or clicks. No pedal edema. Gastrointestinal system: Abdomen is nondistended, soft and mild tender. No organomegaly or masses felt. Normal bowel sounds heard. Central nervous system: Alert and oriented. No focal neurological deficits. Extremities: Symmetric 5 x 5 power. Skin: No rashes, lesions or ulcers Psychiatry: Judgement and insight appear normal. Mood & affect appropriate.     Data Reviewed: I have personally  reviewed following labs and imaging studies  CBC: Recent Labs  Lab 04/13/20 1003 04/14/20 0336 04/15/20 0254  WBC 15.9* 16.6* 12.3*  NEUTROABS 12.8*  --   --   HGB 16.5 15.4 14.2  HCT 46.1 45.0 42.1   MCV 85.7 90.2 89.2  PLT 276 258 [id number]    Basic Metabolic Panel: Recent Labs  Lab 04/13/20 1003 04/14/20 0336  NA 133* 135  K 3.8 3.8  CL 101 102  CO2 25 24  GLUCOSE 132* 90  BUN 7 8  CREATININE 0.94 0.92  CALCIUM 8.8* 8.5*    GFR: Estimated Creatinine Clearance: 124 mL/min (by C-G formula based on SCr of 0.92 mg/dL).  Liver Function Tests: Recent Labs  Lab 04/13/20 1003 04/14/20 0336  AST 20 23  ALT 31 31  ALKPHOS 73 60  BILITOT 1.8* 2.9*  PROT 7.4 6.9  ALBUMIN 4.2 3.8    CBG: No results for input(s): GLUCAP in the last 168 hours.   Recent Results (from the past 240 hour(s))  Urine culture     Status: None   Collection Time: 04/13/20 11:25 AM   Specimen: Urine, Random  Result Value Ref Range Status   Specimen Description   Final    URINE, RANDOM Performed at [facility], [address] [place], [place] [address]    Special Requests   Final    NONE Performed at [facility], [address] [place], [place] [address]    Culture   Final    NO GROWTH Performed at [facility], [place] [address] [place], [place] [address]    Report Status 04/14/2020 FINAL  Final  Respiratory Panel by RT PCR (Flu A&B, Covid) - Urine, Clean Catch     Status: None   Collection Time: 04/13/20 11:28 AM   Specimen: Urine, Clean Catch  Result Value Ref Range Status   SARS Coronavirus 2 by RT PCR NEGATIVE NEGATIVE Final    Comment: (NOTE) SARS-CoV-2 target nucleic acids are NOT DETECTED. The SARS-CoV-2 RNA is generally detectable in upper respiratoy specimens during the acute phase of infection. The lowest concentration of SARS-CoV-2 viral copies this assay can detect is 131 copies/mL. A negative result does not preclude SARS-Cov-2 infection and should not be used as the sole basis for treatment or other patient management decisions. A negative result may occur with  improper specimen collection/handling, submission of specimen other than  nasopharyngeal swab, presence of viral mutation(s) within the areas targeted by this assay, and inadequate number of viral copies (<131 copies/mL). A negative result must be combined with clinical observations, patient history, and epidemiological information. The expected result is Negative. Fact Sheet for Patients:  PinkCheek.be Fact Sheet for Healthcare Providers:  GravelBags.it This test is not yet ap proved or cleared by the [place] FDA and  has been authorized for detection and/or diagnosis of SARS-CoV-2 by FDA under an Emergency Use Authorization (EUA). This EUA will remain  in effect (meaning this test can be used) for the duration of the COVID-19 declaration under Section 564(b)(1) of the Act, 21 U.S.C. section 360bbb-3(b)(1), unless the authorization is terminated or revoked sooner.    Influenza A by PCR NEGATIVE NEGATIVE Final   Influenza B by PCR NEGATIVE NEGATIVE Final    Comment: (NOTE) The Xpert Xpress SARS-CoV-2/FLU/RSV assay is intended as an aid in  the diagnosis of influenza from Nasopharyngeal swab specimens and  should not be used as a sole basis for treatment.  Nasal washings and  aspirates are unacceptable for Xpert Xpress SARS-CoV-2/FLU/RSV  testing. Fact Sheet for Patients: PinkCheek.be Fact Sheet for Healthcare Providers: GravelBags.it This test is not yet approved or cleared by the [place] FDA and  has been authorized for detection and/or diagnosis of SARS-CoV-2 by  FDA under an Emergency Use Authorization (EUA). This EUA will remain  in effect (meaning this test can be used) for the duration of the  Covid-19 declaration under Section 564(b)(1) of the Act, 21  U.S.C. section 360bbb-3(b)(1), unless the authorization is  terminated or revoked. Performed at [facility], [address] [place], [place] [address]           Radiology Studies: No results found.      Scheduled Meds: . amLODipine  10 mg Oral Daily  . enoxaparin (LOVENOX) injection  50 mg Subcutaneous Q24H  . loratadine  10 mg Oral Daily  . metoprolol succinate  50 mg Oral Daily  . multivitamin with minerals  1 tablet Oral Daily  . omega-3 acid ethyl esters  1 g Oral Daily  . pantoprazole  40 mg Oral BID   Continuous Infusions: . sodium chloride 50 mL/hr at 04/15/20 1007  . piperacillin-tazobactam (ZOSYN)  IV 3.375 g (04/15/20 0915)     LOS: 2 days     Georgette Shell, MD [facility]   To contact the attending provider between 7A-7P or the covering provider during after hours 7P-7A, please log into the web site [email or web address] and access using universal [place] password for that web site. If you do not have the password, please call the hospital operator.  04/15/2020, 2:11 PM

## 2020-04-15 NOTE — Progress Notes (Addendum)
Paged by RN for increased abdominal pain s/p IV dilaudid x2  Patient reports having a BM 15 minutes after drinking some clears, reports a painful and hard stool followed by severe lower abdominal pain with some radiation to his scrotum. Pain described as worse than when he came in to the ED. No fever, chills, nausea, vomiting.   Vitals taken during my exam - afebrile, HR 80, slightly hypertensive ~140/80  Exam: slightly distended, TTP LLQ and suprapubic region, no peritonitis.    No acute surgical needs. Continue to monitor vitals and abdominal exam. May sip on some clear liquids as desired but does not have to. CBC in AM. Will consider KUB or repeat imaging for fever, worsening leukocytosis, or peritonitis.    [name], PA-C   [facility]  Discussed with Melina Modena, PA-C.  Discussed with nurse on floor, Anderson Malta.  Patient and wife expecting AXR this evening.  Will order two view AXR for this evening.  Armandina Gemma, MD [facility] Office: [phone]

## 2020-04-16 DIAGNOSIS — K5720 Diverticulitis of large intestine with perforation and abscess without bleeding: Secondary | ICD-10-CM | POA: Diagnosis not present

## 2020-04-16 LAB — CBC
HCT: 42.4 % (ref 39.0–52.0)
Hemoglobin: 14.5 g/dL (ref 13.0–17.0)
MCH: 30.7 pg (ref 26.0–34.0)
MCHC: 34.2 g/dL (ref 30.0–36.0)
MCV: 89.6 fL (ref 80.0–100.0)
Platelets: 256 10*3/uL (ref 150–400)
RBC: 4.73 MIL/uL (ref 4.22–5.81)
RDW: 12.2 % (ref 11.5–15.5)
WBC: 11.5 10*3/uL — ABNORMAL HIGH (ref 4.0–10.5)
nRBC: 0 % (ref 0.0–0.2)

## 2020-04-16 LAB — BASIC METABOLIC PANEL
Anion gap: 9 (ref 5–15)
BUN: 7 mg/dL (ref 6–20)
CO2: 24 mmol/L (ref 22–32)
Calcium: 8.6 mg/dL — ABNORMAL LOW (ref 8.9–10.3)
Chloride: 101 mmol/L (ref 98–111)
Creatinine, Ser: 0.78 mg/dL (ref 0.61–1.24)
GFR calc Af Amer: >60 mL/min (ref >60)
GFR calc non Af Amer: >60 mL/min (ref >60)
Glucose, Bld: 111 mg/dL — ABNORMAL HIGH (ref 70–99)
Potassium: 3.6 mmol/L (ref 3.5–5.1)
Sodium: 134 mmol/L — ABNORMAL LOW (ref 135–145)

## 2020-04-16 MED ORDER — ACETAMINOPHEN 500 MG PO TABS
1000.0000 mg | ORAL_TABLET | Freq: Four times a day (QID) | ORAL | Status: DC
  Administered 2020-04-16 – 2020-04-19 (×7): 1000 mg via ORAL
  Filled 2020-04-16 (×9): qty 2

## 2020-04-16 MED ORDER — LIDOCAINE 5 % EX PTCH
1.0000 | MEDICATED_PATCH | CUTANEOUS | Status: DC
  Administered 2020-04-16 – 2020-04-18 (×2): 1 via TRANSDERMAL
  Filled 2020-04-16 (×4): qty 1

## 2020-04-16 NOTE — Progress Notes (Signed)
PROGRESS NOTE    Jordan Buchanan    Code Status: Full Code  IR:[id number] DOB: 03-Jun-1971 DOA: 04/13/2020 LOS: 3 days  PCP: Ria Bush, MD CC:  Chief Complaint  Patient presents with  . Abdominal Pain       Hospital Summary   This is a 49 year old male with history of hypertension, obesity, GERD, migraine, nephrolithiasis, past abdominal surgeries who presented to [facility] with abdominal pain and right flank pain worse on exertion with nausea and dysuria and increased urinary frequency.  In the ED was found to have leukocytosis.  CT scan of the abdomen showed focal perforation in the mid sigmoid colon without abscess.  General surgery was consulted and patient was transferred to [facility] for further management.  He has since been on IV antibiotics with slowly advancing diet.  A & P   Active Problems:   Diverticulitis of colon with perforation   Perforated diverticulum   1. Acute sigmoid diverticulitis with focal perforation a. Still with abdominal pain today with slowly improving WBC, currently 11.5 b. Continue current pain regimen and Lidoderm patch added by surgery as patient wished to limit his pain medication use c. Continue clear liquid diet, encourage ambulation continue IV antibiotics (currently day 4/TBD)  2. Hypertension a. Continue Norvasc and metoprolol  3. GERD on Protonix  4. Obesity a. Body mass index is 34.44 kg/m.  DVT prophylaxis: SCDs, Lovenox  Family Communication: Patient's wife at bedside has been updated  Disposition Plan:  Status is: Inpatient  Remains inpatient appropriate because:IV treatments appropriate due to intensity of illness or inability to take PO   Dispo: The patient is from: Home              Anticipated d/c is to: Home              Anticipated d/c date is: 2 days              Patient currently is not medically stable to d/c.           Pressure injury documentation    None  Consultants  General surgery   Procedures    None  Antibiotics   Anti-infectives (From admission, onward)   Start     Dose/Rate Route Frequency Ordered Stop   04/13/20 1700  piperacillin-tazobactam (ZOSYN) IVPB 3.375 g     3.375 g 12.5 mL/hr over 240 Minutes Intravenous Every 8 hours 04/13/20 1214     04/13/20 1115  piperacillin-tazobactam (ZOSYN) IVPB 3.375 g     3.375 g 100 mL/hr over 30 Minutes Intravenous  Once 04/13/20 1105 04/13/20 1130        Subjective   Patient seen and examined at bedside in no acute distress and resting comfortably. No acute events overnight. Ambulating with more pain today after ambulation.  She is to limit his analgesics to limit medication use.  Otherwise no issues  Objective   Vitals:   04/15/20 2134 04/16/20 0513 04/16/20 0518 04/16/20 1435  BP: 129/88 114/83 (!) 155/68 140/89  Pulse: 87 75 79 79  Resp: 18 17 18 17   Temp: 99.5 F (37.5 C) 98.6 F (37 C) 98.5 F (36.9 C) 98.8 F (37.1 C)  TempSrc: Oral Oral Oral Oral  SpO2: 96% 97% 92% 97%  Weight:      Height:        Intake/Output Summary (Last 24 hours) at 04/16/2020 1543 Last data filed at 04/16/2020 1400 Gross per 24 hour  Intake 2897.76 ml  Output --  Net 2897.76 ml   Filed Weights   04/13/20 0954  Weight: 108.9 kg    Examination:  Physical Exam Vitals and nursing note reviewed.  Constitutional:      Appearance: Normal appearance.  HENT:     Head: Normocephalic and atraumatic.  Eyes:     Conjunctiva/sclera: Conjunctivae normal.  Cardiovascular:     Rate and Rhythm: Normal rate and regular rhythm.  Pulmonary:     Effort: Pulmonary effort is normal.     Breath sounds: Normal breath sounds.  Abdominal:     Palpations: Abdomen is soft.     Tenderness: There is generalized abdominal tenderness.  Musculoskeletal:        General: No swelling or tenderness.  Skin:    Coloration: Skin is not jaundiced or pale.  Neurological:     Mental Status: He is alert. Mental status is at baseline.  Psychiatric:         Mood and Affect: Mood normal.        Behavior: Behavior normal.     Data Reviewed: I have personally reviewed following labs and imaging studies  CBC: Recent Labs  Lab 04/13/20 1003 04/14/20 0336 04/15/20 0254 04/16/20 0743  WBC 15.9* 16.6* 12.3* 11.5*  NEUTROABS 12.8*  --   --   --   HGB 16.5 15.4 14.2 14.5  HCT 46.1 45.0 42.1 42.4  MCV 85.7 90.2 89.2 89.6  PLT 276 258 227 [id number]   Basic Metabolic Panel: Recent Labs  Lab 04/13/20 1003 04/14/20 0336 04/16/20 0743  NA 133* 135 134*  K 3.8 3.8 3.6  CL 101 102 101  CO2 25 24 24   GLUCOSE 132* 90 111*  BUN 7 8 7   CREATININE 0.94 0.92 0.78  CALCIUM 8.8* 8.5* 8.6*   GFR: Estimated Creatinine Clearance: 142.6 mL/min (by C-G formula based on SCr of 0.78 mg/dL). Liver Function Tests: Recent Labs  Lab 04/13/20 1003 04/14/20 0336  AST 20 23  ALT 31 31  ALKPHOS 73 60  BILITOT 1.8* 2.9*  PROT 7.4 6.9  ALBUMIN 4.2 3.8   No results for input(s): LIPASE, AMYLASE in the last 168 hours. No results for input(s): AMMONIA in the last 168 hours. Coagulation Profile: No results for input(s): INR, PROTIME in the last 168 hours. Cardiac Enzymes: No results for input(s): CKTOTAL, CKMB, CKMBINDEX, TROPONINI in the last 168 hours. BNP (last 3 results) No results for input(s): PROBNP in the last 8760 hours. HbA1C: No results for input(s): HGBA1C in the last 72 hours. CBG: No results for input(s): GLUCAP in the last 168 hours. Lipid Profile: No results for input(s): CHOL, HDL, LDLCALC, TRIG, CHOLHDL, LDLDIRECT in the last 72 hours. Thyroid Function Tests: No results for input(s): TSH, T4TOTAL, FREET4, T3FREE, THYROIDAB in the last 72 hours. Anemia Panel: No results for input(s): VITAMINB12, FOLATE, FERRITIN, TIBC, IRON, RETICCTPCT in the last 72 hours. Sepsis Labs: No results for input(s): PROCALCITON, LATICACIDVEN in the last 168 hours.  Recent Results (from the past 240 hour(s))  Urine culture     Status: None   Collection  Time: 04/13/20 11:25 AM   Specimen: Urine, Random  Result Value Ref Range Status   Specimen Description   Final    URINE, RANDOM Performed at [facility], [address] [place], [place] [address]    Special Requests   Final    NONE Performed at [facility], [address] [place], [place] [address]    Culture  Final    NO GROWTH Performed at [facility], [place] [address], [place], [place] [address]    Report Status 04/14/2020 FINAL  Final  Respiratory Panel by RT PCR (Flu A&B, Covid) - Urine, Clean Catch     Status: None   Collection Time: 04/13/20 11:28 AM   Specimen: Urine, Clean Catch  Result Value Ref Range Status   SARS Coronavirus 2 by RT PCR NEGATIVE NEGATIVE Final    Comment: (NOTE) SARS-CoV-2 target nucleic acids are NOT DETECTED. The SARS-CoV-2 RNA is generally detectable in upper respiratoy specimens during the acute phase of infection. The lowest concentration of SARS-CoV-2 viral copies this assay can detect is 131 copies/mL. A negative result does not preclude SARS-Cov-2 infection and should not be used as the sole basis for treatment or other patient management decisions. A negative result may occur with  improper specimen collection/handling, submission of specimen other than nasopharyngeal swab, presence of viral mutation(s) within the areas targeted by this assay, and inadequate number of viral copies (<131 copies/mL). A negative result must be combined with clinical observations, patient history, and epidemiological information. The expected result is Negative. Fact Sheet for Patients:  PinkCheek.be Fact Sheet for Healthcare Providers:  GravelBags.it This test is not yet ap proved or cleared by the [place] FDA and  has been authorized for detection and/or diagnosis of SARS-CoV-2 by FDA under an Emergency Use Authorization (EUA). This EUA will remain  in effect  (meaning this test can be used) for the duration of the COVID-19 declaration under Section 564(b)(1) of the Act, 21 U.S.C. section 360bbb-3(b)(1), unless the authorization is terminated or revoked sooner.    Influenza A by PCR NEGATIVE NEGATIVE Final   Influenza B by PCR NEGATIVE NEGATIVE Final    Comment: (NOTE) The Xpert Xpress SARS-CoV-2/FLU/RSV assay is intended as an aid in  the diagnosis of influenza from Nasopharyngeal swab specimens and  should not be used as a sole basis for treatment. Nasal washings and  aspirates are unacceptable for Xpert Xpress SARS-CoV-2/FLU/RSV  testing. Fact Sheet for Patients: PinkCheek.be Fact Sheet for Healthcare Providers: GravelBags.it This test is not yet approved or cleared by the [place] FDA and  has been authorized for detection and/or diagnosis of SARS-CoV-2 by  FDA under an Emergency Use Authorization (EUA). This EUA will remain  in effect (meaning this test can be used) for the duration of the  Covid-19 declaration under Section 564(b)(1) of the Act, 21  U.S.C. section 360bbb-3(b)(1), unless the authorization is  terminated or revoked. Performed at [facility], [address] [place], [place] [address]          Radiology Studies: DG Abd 2 Views  Result Date: 04/15/2020 CLINICAL DATA:  Abdominal pain after bowel movement today. Recent diagnosis of diverticulitis with micro perforation. EXAM: ABDOMEN - 2 VIEW COMPARISON:  CT 04/13/2020 FINDINGS: No free air under the hemidiaphragms. Small foci of extraluminal gas adjacent to the sigmoid colon on CT are not well demonstrated radiographically. Air-filled small bowel in the central abdomen that is prominent with scattered air-fluid levels. Mild gaseous distension of ascending, transverse, and proximal descending colon. Stable osseous structures. IMPRESSION: 1. No free air under the hemidiaphragms. Extraluminal air  adjacent to sigmoid colon on recent CT is not well demonstrated radiographically. 2. Scattered air-fluid levels within prominent small bowel likely reactive ileus. Mild gaseous distension of transverse colon. Electronically Signed   By: Keith Rake M.D.   On: 04/15/2020 20:30  Scheduled Meds: . acetaminophen  1,000 mg Oral Q6H  . amLODipine  10 mg Oral Daily  . enoxaparin (LOVENOX) injection  50 mg Subcutaneous Q24H  . lidocaine  1 patch Transdermal Q24H  . loratadine  10 mg Oral Daily  . metoprolol succinate  50 mg Oral Daily  . multivitamin with minerals  1 tablet Oral Daily  . omega-3 acid ethyl esters  1 g Oral Daily  . pantoprazole  40 mg Oral BID   Continuous Infusions: . sodium chloride 50 mL/hr at 04/16/20 0600  . piperacillin-tazobactam (ZOSYN)  IV 3.375 g (04/16/20 0859)     Time spent: 27 minutes with over 50% of the time coordinating the patient's care    Harold Hedge, DO [facility] Pager [phone]  Call night coverage person covering after 7pm

## 2020-04-16 NOTE — Progress Notes (Signed)
[facility] Progress Note     Subjective: CC:  NAEO. Abdominal pain slightly improved but still has significant LLQ pain making it hard to walk. Denies fever, chills, nausea, vomiting. No BM since painful BM yesterday evening. +flatus.   Objective: Vital signs in last 24 hours: Temp:  [98.5 F (36.9 C)-99.5 F (37.5 C)] 98.5 F (36.9 C) (04/28 0518) Pulse Rate:  [75-87] 79 (04/28 0518) Resp:  [16-19] 18 (04/28 0518) BP: (114-155)/(68-91) 155/68 (04/28 0518) SpO2:  [92 %-97 %] 92 % (04/28 0518) Last BM Date: 04/15/20  Intake/Output from previous day: 04/27 0701 - 04/28 0700 In: 1967.6 [P.O.:600; I.V.:1217.7; IV Piggyback:149.9] Out: -  Intake/Output this shift: No intake/output data recorded.  PE: Gen:  Alert, NAD, pleasant Card:  Regular rate and rhythm, pedal pulses 2+ BL Pulm:  Normal effort, clear to auscultation bilaterally Abd: Soft, mild distention, tender to light palpation LLQ and suprapubic region, no peritonitis, no HSM Skin: warm and dry, no rashes  Psych: A&Ox3   Lab Results:  Recent Labs    04/15/20 0254 04/16/20 0743  WBC 12.3* 11.5*  HGB 14.2 14.5  HCT 42.1 42.4  PLT 227 256   BMET Recent Labs    04/13/20 1003 04/14/20 0336  NA 133* 135  K 3.8 3.8  CL 101 102  CO2 25 24  GLUCOSE 132* 90  BUN 7 8  CREATININE 0.94 0.92  CALCIUM 8.8* 8.5*   PT/INR No results for input(s): LABPROT, INR in the last 72 hours. CMP     Component Value Date/Time   NA 135 04/14/2020 0336   K 3.8 04/14/2020 0336   CL 102 04/14/2020 0336   CO2 24 04/14/2020 0336   GLUCOSE 90 04/14/2020 0336   BUN 8 04/14/2020 0336   CREATININE 0.92 04/14/2020 0336   CALCIUM 8.5 (L) 04/14/2020 0336   PROT 6.9 04/14/2020 0336   ALBUMIN 3.8 04/14/2020 0336   AST 23 04/14/2020 0336   ALT 31 04/14/2020 0336   ALKPHOS 60 04/14/2020 0336   BILITOT 2.9 (H) 04/14/2020 0336   GFRNONAA >60 04/14/2020 0336   GFRAA >60 04/14/2020 0336   Lipase     Component Value  Date/Time   LIPASE 35 06/18/2019 0218       Studies/Results: DG Abd 2 Views  Result Date: 04/15/2020 CLINICAL DATA:  Abdominal pain after bowel movement today. Recent diagnosis of diverticulitis with micro perforation. EXAM: ABDOMEN - 2 VIEW COMPARISON:  CT 04/13/2020 FINDINGS: No free air under the hemidiaphragms. Small foci of extraluminal gas adjacent to the sigmoid colon on CT are not well demonstrated radiographically. Air-filled small bowel in the central abdomen that is prominent with scattered air-fluid levels. Mild gaseous distension of ascending, transverse, and proximal descending colon. Stable osseous structures. IMPRESSION: 1. No free air under the hemidiaphragms. Extraluminal air adjacent to sigmoid colon on recent CT is not well demonstrated radiographically. 2. Scattered air-fluid levels within prominent small bowel likely reactive ileus. Mild gaseous distension of transverse colon. Electronically Signed   By: Keith Rake M.D.   On: 04/15/2020 20:30    Anti-infectives: Anti-infectives (From admission, onward)   Start     Dose/Rate Route Frequency Ordered Stop   04/13/20 1700  piperacillin-tazobactam (ZOSYN) IVPB 3.375 g     3.375 g 12.5 mL/hr over 240 Minutes Intravenous Every 8 hours 04/13/20 1214     04/13/20 1115  piperacillin-tazobactam (ZOSYN) IVPB 3.375 g     3.375 g 100 mL/hr over 30 Minutes Intravenous  Once  04/13/20 1105 04/13/20 1130       Assessment/Plan HTN GERD Nephrolithiasis OSA  Possible bilateral inguinal hernias - pt asymptomatic, seen on CT  Sigmoid diverticulitis with focal air in mesentery, no abscess - AFVSS, WBC 11.5 from 12.3 - continue clear liquids - continue IV Zosyn  - PRN analgesics and antiemetics, add lidoderm patch   FEN: CLD ID: [id number] VTE: SCD's, Lovenox   LOS: 3 days    [name], [facility]

## 2020-04-17 ENCOUNTER — Encounter: Payer: Self-pay | Admitting: Family Medicine

## 2020-04-17 LAB — CBC
HCT: 42.6 % (ref 39.0–52.0)
Hemoglobin: 14.6 g/dL (ref 13.0–17.0)
MCH: 30.2 pg (ref 26.0–34.0)
MCHC: 34.3 g/dL (ref 30.0–36.0)
MCV: 88.2 fL (ref 80.0–100.0)
Platelets: 287 10*3/uL (ref 150–400)
RBC: 4.83 MIL/uL (ref 4.22–5.81)
RDW: 12.2 % (ref 11.5–15.5)
WBC: 9.5 10*3/uL (ref 4.0–10.5)
nRBC: 0 % (ref 0.0–0.2)

## 2020-04-17 LAB — BASIC METABOLIC PANEL
Anion gap: 6 (ref 5–15)
BUN: 5 mg/dL — ABNORMAL LOW (ref 6–20)
CO2: 26 mmol/L (ref 22–32)
Calcium: 8.7 mg/dL — ABNORMAL LOW (ref 8.9–10.3)
Chloride: 104 mmol/L (ref 98–111)
Creatinine, Ser: 0.82 mg/dL (ref 0.61–1.24)
GFR calc Af Amer: >60 mL/min (ref >60)
GFR calc non Af Amer: >60 mL/min (ref >60)
Glucose, Bld: 105 mg/dL — ABNORMAL HIGH (ref 70–99)
Potassium: 3.7 mmol/L (ref 3.5–5.1)
Sodium: 136 mmol/L (ref 135–145)

## 2020-04-17 MED ORDER — SACCHAROMYCES BOULARDII 250 MG PO CAPS
250.0000 mg | ORAL_CAPSULE | Freq: Two times a day (BID) | ORAL | Status: DC
  Administered 2020-04-17 – 2020-04-19 (×5): 250 mg via ORAL
  Filled 2020-04-17 (×5): qty 1

## 2020-04-17 NOTE — Progress Notes (Signed)
[facility] Progress Note     Subjective: CC:  Reports feeling much better. Ambulating and tolerating clears without pain, fever, nausea, or vomiting. Having loose stools.   Objective: Vital signs in last 24 hours: Temp:  [98.3 F (36.8 C)-98.8 F (37.1 C)] 98.5 F (36.9 C) (04/29 0523) Pulse Rate:  [75-79] 77 (04/29 0523) Resp:  [17-18] 18 (04/29 0523) BP: (128-140)/(87-89) 131/88 (04/29 0523) SpO2:  [97 %-98 %] 97 % (04/29 0523) Last BM Date: 04/17/20  Intake/Output from previous day: 04/28 0701 - 04/29 0700 In: 2069.7 [P.O.:720; I.V.:1199.7; IV Piggyback:150] Out: -  Intake/Output this shift: Total I/O In: 360 [P.O.:360] Out: -   PE: Gen:  Alert, NAD, pleasant Card:  Regular rate and rhythm, pedal pulses 2+ BL Pulm:  Normal effort, clear to auscultation bilaterally Abd: Soft, minimal LLQ tenderness without guarding, +BS Skin: warm and dry, no rashes  Psych: A&Ox3   Lab Results:  Recent Labs    04/16/20 0743 04/17/20 0316  WBC 11.5* 9.5  HGB 14.5 14.6  HCT 42.4 42.6  PLT 256 287   BMET Recent Labs    04/16/20 0743 04/17/20 0316  NA 134* 136  K 3.6 3.7  CL 101 104  CO2 24 26  GLUCOSE 111* 105*  BUN 7 5*  CREATININE 0.78 0.82  CALCIUM 8.6* 8.7*   PT/INR No results for input(s): LABPROT, INR in the last 72 hours. CMP     Component Value Date/Time   NA 136 04/17/2020 0316   K 3.7 04/17/2020 0316   CL 104 04/17/2020 0316   CO2 26 04/17/2020 0316   GLUCOSE 105 (H) 04/17/2020 0316   BUN 5 (L) 04/17/2020 0316   CREATININE 0.82 04/17/2020 0316   CALCIUM 8.7 (L) 04/17/2020 0316   PROT 6.9 04/14/2020 0336   ALBUMIN 3.8 04/14/2020 0336   AST 23 04/14/2020 0336   ALT 31 04/14/2020 0336   ALKPHOS 60 04/14/2020 0336   BILITOT 2.9 (H) 04/14/2020 0336   GFRNONAA >60 04/17/2020 0316   GFRAA >60 04/17/2020 0316   Lipase     Component Value Date/Time   LIPASE 35 06/18/2019 0218       Studies/Results: DG Abd 2 Views  Result  Date: 04/15/2020 CLINICAL DATA:  Abdominal pain after bowel movement today. Recent diagnosis of diverticulitis with micro perforation. EXAM: ABDOMEN - 2 VIEW COMPARISON:  CT 04/13/2020 FINDINGS: No free air under the hemidiaphragms. Small foci of extraluminal gas adjacent to the sigmoid colon on CT are not well demonstrated radiographically. Air-filled small bowel in the central abdomen that is prominent with scattered air-fluid levels. Mild gaseous distension of ascending, transverse, and proximal descending colon. Stable osseous structures. IMPRESSION: 1. No free air under the hemidiaphragms. Extraluminal air adjacent to sigmoid colon on recent CT is not well demonstrated radiographically. 2. Scattered air-fluid levels within prominent small bowel likely reactive ileus. Mild gaseous distension of transverse colon. Electronically Signed   By: Keith Rake M.D.   On: 04/15/2020 20:30    Anti-infectives: Anti-infectives (From admission, onward)   Start     Dose/Rate Route Frequency Ordered Stop   04/13/20 1700  piperacillin-tazobactam (ZOSYN) IVPB 3.375 g     3.375 g 12.5 mL/hr over 240 Minutes Intravenous Every 8 hours 04/13/20 1214     04/13/20 1115  piperacillin-tazobactam (ZOSYN) IVPB 3.375 g     3.375 g 100 mL/hr over 30 Minutes Intravenous  Once 04/13/20 1105 04/13/20 1130       Assessment/Plan HTN GERD Nephrolithiasis  OSA  Possible bilateral inguinal hernias - pt asymptomatic, seen on CT  Sigmoid diverticulitis with focal air in mesentery, no abscess - AFVSS, WBC 9.5 - advance to FLD, may have SOFT diet at breakfast if tolerates FLD today. - continue IV Zosyn  - PRN analgesics and antiemetics, add lidoderm patch   FEN: FLD ID: Zosyn VTE: SCD's, Lovenox   LOS: 4 days    [name], [facility]

## 2020-04-17 NOTE — Plan of Care (Signed)
  Problem: Clinical Measurements: Goal: Ability to maintain clinical measurements within normal limits will improve Outcome: Progressing Goal: Will remain free from infection Outcome: Progressing Goal: Diagnostic test results will improve Outcome: Progressing   Problem: Activity: Goal: Risk for activity intolerance will decrease Outcome: Progressing   Problem: Nutrition: Goal: Adequate nutrition will be maintained Outcome: Progressing   Problem: Coping: Goal: Level of anxiety will decrease Outcome: Progressing   Problem: Elimination: Goal: Will not experience complications related to bowel motility Outcome: Progressing   Problem: Pain Managment: Goal: General experience of comfort will improve Outcome: Progressing   Problem: Safety: Goal: Ability to remain free from injury will improve Outcome: Progressing

## 2020-04-17 NOTE — Progress Notes (Signed)
PROGRESS NOTE    Jordan Buchanan    Code Status: Full Code  [id number] DOB: 1971/09/15 DOA: 04/13/2020 LOS: 4 days  PCP: Ria Bush, MD CC:  Chief Complaint  Patient presents with  . Abdominal Pain       Hospital Summary   This is a 49 year old male with history of hypertension, obesity, GERD, migraine, nephrolithiasis, past abdominal surgeries who presented to [facility] with abdominal pain and right flank pain worse on exertion with nausea and dysuria and increased urinary frequency.  In the ED was found to have leukocytosis.  CT scan of the abdomen showed focal perforation in the mid sigmoid colon without abscess.  General surgery was consulted and patient was transferred to [facility] for further management.  He has since been on IV antibiotics with slowly advancing diet.  A & P   Active Problems:   Diverticulitis of colon with perforation   Perforated diverticulum   1. Acute sigmoid diverticulitis with focal perforation a. Still with abdominal pain today with normalized WBC b. Continue current pain regimen with Lidoderm patch which has helped significantly c. Advance to FLD  d. encourage ambulation continue IV antibiotics (currently day 5/TBD)  2. Hypertension a. Continue Norvasc and metoprolol  3. GERD on Protonix  4. Obesity Body mass index is 34.44 kg/m.  DVT prophylaxis: SCDs, Lovenox  Family Communication: Patient's wife at bedside has been updated  Disposition Plan:  Status is: Inpatient  Remains inpatient appropriate because:IV treatments appropriate due to intensity of illness or inability to take PO   Dispo: The patient is from: Home              Anticipated d/c is to: Home              Anticipated d/c date is: 2 days              Patient currently is not medically stable to d/c.           Pressure injury documentation    None  Consultants  General surgery   Procedures  None  Antibiotics   Anti-infectives (From admission, onward)   Start     Dose/Rate Route Frequency Ordered Stop   04/13/20 1700  piperacillin-tazobactam (ZOSYN) IVPB 3.375 g     3.375 g 12.5 mL/hr over 240 Minutes Intravenous Every 8 hours 04/13/20 1214     04/13/20 1115  piperacillin-tazobactam (ZOSYN) IVPB 3.375 g     3.375 g 100 mL/hr over 30 Minutes Intravenous  Once 04/13/20 1105 04/13/20 1130        Subjective   Doing well. With improved but not resolved abdominal pain. Having loose bowel movements and requesting a probiotic. No other issues  Objective   Vitals:   04/16/20 0518 04/16/20 1435 04/16/20 2027 04/17/20 0523  BP: (!) 155/68 140/89 128/87 131/88  Pulse: 79 79 75 77  Resp: 18 17 18 18   Temp: 98.5 F (36.9 C) 98.8 F (37.1 C) 98.3 F (36.8 C) 98.5 F (36.9 C)  TempSrc: Oral Oral Oral Oral  SpO2: 92% 97% 98% 97%  Weight:      Height:        Intake/Output Summary (Last 24 hours) at 04/17/2020 1100 Last data filed at 04/17/2020 0725 Gross per 24 hour  Intake 2109.63 ml  Output --  Net 2109.63 ml   Filed Weights   04/13/20 0954  Weight: 108.9 kg    Examination:  Physical Exam Vitals and nursing note reviewed.  Constitutional:  Appearance: Normal appearance.  HENT:     Head: Normocephalic and atraumatic.  Eyes:     Conjunctiva/sclera: Conjunctivae normal.  Cardiovascular:     Rate and Rhythm: Normal rate and regular rhythm.  Pulmonary:     Effort: Pulmonary effort is normal.     Breath sounds: Normal breath sounds.  Abdominal:     Palpations: Abdomen is soft.     Tenderness: There is abdominal tenderness in the left lower quadrant.  Musculoskeletal:        General: No swelling or tenderness.  Skin:    Coloration: Skin is not jaundiced or pale.  Neurological:     Mental Status: He is alert. Mental status is at baseline.  Psychiatric:        Mood and Affect: Mood normal.        Behavior: Behavior normal.     Data Reviewed: I have personally reviewed following labs and imaging  studies  CBC: Recent Labs  Lab 04/13/20 1003 04/14/20 0336 04/15/20 0254 04/16/20 0743 04/17/20 0316  WBC 15.9* 16.6* 12.3* 11.5* 9.5  NEUTROABS 12.8*  --   --   --   --   HGB 16.5 15.4 14.2 14.5 14.6  HCT 46.1 45.0 42.1 42.4 42.6  MCV 85.7 90.2 89.2 89.6 88.2  PLT 276 258 227 256 [id number]   Basic Metabolic Panel: Recent Labs  Lab 04/13/20 1003 04/14/20 0336 04/16/20 0743 04/17/20 0316  NA 133* 135 134* 136  K 3.8 3.8 3.6 3.7  CL 101 102 101 104  CO2 25 24 24 26   GLUCOSE 132* 90 111* 105*  BUN 7 8 7  5*  CREATININE 0.94 0.92 0.78 0.82  CALCIUM 8.8* 8.5* 8.6* 8.7*   GFR: Estimated Creatinine Clearance: 139.2 mL/min (by C-G formula based on SCr of 0.82 mg/dL). Liver Function Tests: Recent Labs  Lab 04/13/20 1003 04/14/20 0336  AST 20 23  ALT 31 31  ALKPHOS 73 60  BILITOT 1.8* 2.9*  PROT 7.4 6.9  ALBUMIN 4.2 3.8   No results for input(s): LIPASE, AMYLASE in the last 168 hours. No results for input(s): AMMONIA in the last 168 hours. Coagulation Profile: No results for input(s): INR, PROTIME in the last 168 hours. Cardiac Enzymes: No results for input(s): CKTOTAL, CKMB, CKMBINDEX, TROPONINI in the last 168 hours. BNP (last 3 results) No results for input(s): PROBNP in the last 8760 hours. HbA1C: No results for input(s): HGBA1C in the last 72 hours. CBG: No results for input(s): GLUCAP in the last 168 hours. Lipid Profile: No results for input(s): CHOL, HDL, LDLCALC, TRIG, CHOLHDL, LDLDIRECT in the last 72 hours. Thyroid Function Tests: No results for input(s): TSH, T4TOTAL, FREET4, T3FREE, THYROIDAB in the last 72 hours. Anemia Panel: No results for input(s): VITAMINB12, FOLATE, FERRITIN, TIBC, IRON, RETICCTPCT in the last 72 hours. Sepsis Labs: No results for input(s): PROCALCITON, LATICACIDVEN in the last 168 hours.  Recent Results (from the past 240 hour(s))  Urine culture     Status: None   Collection Time: 04/13/20 11:25 AM   Specimen: Urine, Random   Result Value Ref Range Status   Specimen Description   Final    URINE, RANDOM Performed at [facility], [address] [place], [place] [address]    Special Requests   Final    NONE Performed at [facility], [address] [place], [place] [address]    Culture   Final    NO GROWTH Performed at [facility], [place] [address] [place], [place] [address]    Report Status 04/14/2020 FINAL  Final  Respiratory Panel by RT PCR (Flu A&B, Covid) - Urine, Clean Catch     Status: None   Collection Time: 04/13/20 11:28 AM   Specimen: Urine, Clean Catch  Result Value Ref Range Status   SARS Coronavirus 2 by RT PCR NEGATIVE NEGATIVE Final    Comment: (NOTE) SARS-CoV-2 target nucleic acids are NOT DETECTED. The SARS-CoV-2 RNA is generally detectable in upper respiratoy specimens during the acute phase of infection. The lowest concentration of SARS-CoV-2 viral copies this assay can detect is 131 copies/mL. A negative result does not preclude SARS-Cov-2 infection and should not be used as the sole basis for treatment or other patient management decisions. A negative result may occur with  improper specimen collection/handling, submission of specimen other than nasopharyngeal swab, presence of viral mutation(s) within the areas targeted by this assay, and inadequate number of viral copies (<131 copies/mL). A negative result must be combined with clinical observations, patient history, and epidemiological information. The expected result is Negative. Fact Sheet for Patients:  PinkCheek.be Fact Sheet for Healthcare Providers:  GravelBags.it This test is not yet ap proved or cleared by the [place] FDA and  has been authorized for detection and/or diagnosis of SARS-CoV-2 by FDA under an Emergency Use Authorization (EUA). This EUA will remain  in effect (meaning this test can be used) for the duration of  the COVID-19 declaration under Section 564(b)(1) of the Act, 21 U.S.C. section 360bbb-3(b)(1), unless the authorization is terminated or revoked sooner.    Influenza A by PCR NEGATIVE NEGATIVE Final   Influenza B by PCR NEGATIVE NEGATIVE Final    Comment: (NOTE) The Xpert Xpress SARS-CoV-2/FLU/RSV assay is intended as an aid in  the diagnosis of influenza from Nasopharyngeal swab specimens and  should not be used as a sole basis for treatment. Nasal washings and  aspirates are unacceptable for Xpert Xpress SARS-CoV-2/FLU/RSV  testing. Fact Sheet for Patients: PinkCheek.be Fact Sheet for Healthcare Providers: GravelBags.it This test is not yet approved or cleared by the [place] FDA and  has been authorized for detection and/or diagnosis of SARS-CoV-2 by  FDA under an Emergency Use Authorization (EUA). This EUA will remain  in effect (meaning this test can be used) for the duration of the  Covid-19 declaration under Section 564(b)(1) of the Act, 21  U.S.C. section 360bbb-3(b)(1), unless the authorization is  terminated or revoked. Performed at [facility], [address] [place], [place] [address]          Radiology Studies: DG Abd 2 Views  Result Date: 04/15/2020 CLINICAL DATA:  Abdominal pain after bowel movement today. Recent diagnosis of diverticulitis with micro perforation. EXAM: ABDOMEN - 2 VIEW COMPARISON:  CT 04/13/2020 FINDINGS: No free air under the hemidiaphragms. Small foci of extraluminal gas adjacent to the sigmoid colon on CT are not well demonstrated radiographically. Air-filled small bowel in the central abdomen that is prominent with scattered air-fluid levels. Mild gaseous distension of ascending, transverse, and proximal descending colon. Stable osseous structures. IMPRESSION: 1. No free air under the hemidiaphragms. Extraluminal air adjacent to sigmoid colon on recent CT is not well  demonstrated radiographically. 2. Scattered air-fluid levels within prominent small bowel likely reactive ileus. Mild gaseous distension of transverse colon. Electronically Signed   By: Keith Rake M.D.   On: 04/15/2020 20:30        Scheduled Meds: . acetaminophen  1,000 mg Oral Q6H  . amLODipine  10 mg Oral Daily  . enoxaparin (LOVENOX) injection  50 mg Subcutaneous Q24H  . lidocaine  1 patch Transdermal Q24H  . loratadine  10 mg Oral Daily  . metoprolol succinate  50 mg Oral Daily  . multivitamin with minerals  1 tablet Oral Daily  . omega-3 acid ethyl esters  1 g Oral Daily  . pantoprazole  40 mg Oral BID  . saccharomyces boulardii  250 mg Oral BID   Continuous Infusions: . sodium chloride 50 mL/hr at 04/17/20 0600  . piperacillin-tazobactam (ZOSYN)  IV 3.375 g (04/17/20 0912)     Time spent: 25 minutes with over 50% of the time coordinating the patient's care    Harold Hedge, DO [facility] Pager [phone]  Call night coverage person covering after 7pm

## 2020-04-18 LAB — CBC
HCT: 44.2 % (ref 39.0–52.0)
Hemoglobin: 15.1 g/dL (ref 13.0–17.0)
MCH: 30.4 pg (ref 26.0–34.0)
MCHC: 34.2 g/dL (ref 30.0–36.0)
MCV: 88.9 fL (ref 80.0–100.0)
Platelets: 314 10*3/uL (ref 150–400)
RBC: 4.97 MIL/uL (ref 4.22–5.81)
RDW: 12.4 % (ref 11.5–15.5)
WBC: 8.7 10*3/uL (ref 4.0–10.5)
nRBC: 0 % (ref 0.0–0.2)

## 2020-04-18 LAB — BASIC METABOLIC PANEL
Anion gap: 9 (ref 5–15)
BUN: <5 mg/dL — ABNORMAL LOW (ref 6–20)
CO2: 26 mmol/L (ref 22–32)
Calcium: 9 mg/dL (ref 8.9–10.3)
Chloride: 104 mmol/L (ref 98–111)
Creatinine, Ser: 0.94 mg/dL (ref 0.61–1.24)
GFR calc Af Amer: >60 mL/min (ref >60)
GFR calc non Af Amer: >60 mL/min (ref >60)
Glucose, Bld: 99 mg/dL (ref 70–99)
Potassium: 3.7 mmol/L (ref 3.5–5.1)
Sodium: 139 mmol/L (ref 135–145)

## 2020-04-18 MED ORDER — LIDOCAINE VISCOUS HCL 2 % MT SOLN
15.0000 mL | Freq: Once | OROMUCOSAL | Status: DC
  Filled 2020-04-18: qty 15

## 2020-04-18 MED ORDER — ALUM & MAG HYDROXIDE-SIMETH 200-200-20 MG/5ML PO SUSP
30.0000 mL | Freq: Once | ORAL | Status: AC
  Administered 2020-04-18: 30 mL via ORAL
  Filled 2020-04-18: qty 30

## 2020-04-18 MED ORDER — SODIUM CHLORIDE 0.9 % IV SOLN: INTRAVENOUS | Status: DC

## 2020-04-18 NOTE — Progress Notes (Signed)
     Assessment & Plan: Sigmoid diverticulitis with focal air in mesentery, no abscess - advance to soft diet this AM - continue IV Zosyn another 24 hours, then PO abx if continued progress - consult with dietician for diet on discharge - possibly home tomorrow on oral abx's        Armandina Gemma, MD       [facility]       Office: [phone]   Chief Complaint: Sigmoid diverticulitis  Subjective: Patient in bed, minimal pain.  Wife at bedside.  Tolerating full liquid diet.  Objective: Vital signs in last 24 hours: Temp:  [98 F (36.7 C)-98.4 F (36.9 C)] 98 F (36.7 C) (04/30 0557) Pulse Rate:  [79-83] 83 (04/30 0557) Resp:  [16-20] 20 (04/30 0557) BP: (131-137)/(86-90) 132/90 (04/30 0557) SpO2:  [96 %-99 %] 96 % (04/30 0557) Last BM Date: 04/17/20  Intake/Output from previous day: 04/29 0701 - 04/30 0700 In: 1973.1 [P.O.:600; I.V.:1198.9; IV Piggyback:174.2] Out: -  Intake/Output this shift: No intake/output data recorded.  Physical Exam: HEENT - sclerae clear, mucous membranes moist Neck - soft Abdomen - soft without distension; mild tenderness to deep palpation LLQ; no mass; no guarding Ext - no edema, non-tender Neuro - alert & oriented, no focal deficits  Lab Results:  Recent Labs    04/17/20 0316 04/18/20 0318  WBC 9.5 8.7  HGB 14.6 15.1  HCT 42.6 44.2  PLT 287 314   BMET Recent Labs    04/17/20 0316 04/18/20 0318  NA 136 139  K 3.7 3.7  CL 104 104  CO2 26 26  GLUCOSE 105* 99  BUN 5* <5*  CREATININE 0.82 0.94  CALCIUM 8.7* 9.0   PT/INR No results for input(s): LABPROT, INR in the last 72 hours. Comprehensive Metabolic Panel:    Component Value Date/Time   NA 139 04/18/2020 0318   NA 136 04/17/2020 0316   K 3.7 04/18/2020 0318   K 3.7 04/17/2020 0316   CL 104 04/18/2020 0318   CL 104 04/17/2020 0316   CO2 26 04/18/2020 0318   CO2 26 04/17/2020 0316   BUN <5 (L) 04/18/2020 0318   BUN 5 (L) 04/17/2020 0316   CREATININE 0.94 04/18/2020 0318   CREATININE 0.82 04/17/2020 0316   GLUCOSE 99 04/18/2020 0318   GLUCOSE 105 (H) 04/17/2020 0316   CALCIUM 9.0 04/18/2020 0318   CALCIUM 8.7 (L) 04/17/2020 0316   AST 23 04/14/2020 0336   AST 20 04/13/2020 1003   ALT 31 04/14/2020 0336   ALT 31 04/13/2020 1003   ALKPHOS 60 04/14/2020 0336   ALKPHOS 73 04/13/2020 1003   BILITOT 2.9 (H) 04/14/2020 0336   BILITOT 1.8 (H) 04/13/2020 1003   PROT 6.9 04/14/2020 0336   PROT 7.4 04/13/2020 1003   ALBUMIN 3.8 04/14/2020 0336   ALBUMIN 4.2 04/13/2020 1003    Studies/Results: No results found.    Armandina Gemma 04/18/2020  Patient ID: Burton Apley, male   DOB: 04-09-1971, 49 y.o.   MRN: [id number]

## 2020-04-18 NOTE — Progress Notes (Addendum)
PROGRESS NOTE    Jordan Buchanan    Code Status: Full Code  IR:[id number] DOB: 12/04/1971 DOA: 04/13/2020 LOS: 5 days  PCP: Ria Bush, MD CC:  Chief Complaint  Patient presents with  . Abdominal Pain       Hospital Summary   This is a 49 year old male with history of hypertension, obesity, GERD, migraine, nephrolithiasis, past abdominal surgeries who presented to [facility] with abdominal pain and right flank pain worse on exertion with nausea and dysuria and increased urinary frequency.  In the ED was found to have leukocytosis.  CT scan of the abdomen showed focal perforation in the mid sigmoid colon without abscess.  General surgery was consulted and patient was transferred to [facility] for further management.  He has since been on IV antibiotics with slowly advancing diet.  A & P   Active Problems:   Diverticulitis of colon with perforation   Perforated diverticulum   1. Acute sigmoid diverticulitis with focal perforation a. Continue current pain regimen with Lidoderm patch which has helped significantly b. Advance diet c. encourage ambulation continue IV antibiotics (currently day 6/TBD).  Continue another day of IV antibiotics followed by p.o. antibiotics at discharge hopefully tomorrow  2. Hypertension a. Continue Norvasc and metoprolol  3. GERD on Protonix  4. Obesity Body mass index is 34.44 kg/m.  DVT prophylaxis: SCDs, Lovenox  Family Communication: Patient's wife at bedside has been updated  Disposition Plan:  Status is: Inpatient  Remains inpatient appropriate because:IV treatments appropriate due to intensity of illness or inability to take PO   Dispo: The patient is from: Home              Anticipated d/c is to: Home              Anticipated d/c date is: 1 day              Patient currently is not medically stable to d/c.  Continue IV antibiotics.  Hopeful discharge in next 24 to 48 hours pending general surgery recommendations           Pressure  injury documentation    None  Consultants  General surgery   Procedures  None  Antibiotics   Anti-infectives (From admission, onward)   Start     Dose/Rate Route Frequency Ordered Stop   04/13/20 1700  piperacillin-tazobactam (ZOSYN) IVPB 3.375 g     3.375 g 12.5 mL/hr over 240 Minutes Intravenous Every 8 hours 04/13/20 1214     04/13/20 1115  piperacillin-tazobactam (ZOSYN) IVPB 3.375 g     3.375 g 100 mL/hr over 30 Minutes Intravenous  Once 04/13/20 1105 04/13/20 1130        Subjective   Patient seen and examined at bedside no acute distress and resting comfortably.  No events overnight.  Tolerating diet.  Denies any chest pain, shortness of breath, fever, nausea, vomiting, urinary complaints.  Admits to having loose bowel movement.  Otherwise ROS negative   Objective   Vitals:   04/17/20 2254 04/18/20 0557 04/18/20 1013 04/18/20 1449  BP: 137/88 132/90 (!) 143/95 129/82  Pulse: 79 83 82 71  Resp: 18 20  16   Temp: 98.4 F (36.9 C) 98 F (36.7 C) 98.4 F (36.9 C) 98.3 F (36.8 C)  TempSrc: Oral Oral Oral Oral  SpO2: 99% 96% 97% 97%  Weight:      Height:        Intake/Output Summary (Last 24 hours) at 04/18/2020 1604 Last data filed  at 04/18/2020 0600 Gross per 24 hour  Intake 1283.18 ml  Output --  Net 1283.18 ml   Filed Weights   04/13/20 0954  Weight: 108.9 kg    Examination:  Physical Exam Vitals and nursing note reviewed.  Constitutional:      Appearance: Normal appearance.  HENT:     Head: Normocephalic and atraumatic.  Eyes:     Conjunctiva/sclera: Conjunctivae normal.  Cardiovascular:     Rate and Rhythm: Normal rate and regular rhythm.  Pulmonary:     Effort: Pulmonary effort is normal.     Breath sounds: Normal breath sounds.  Abdominal:     General: Abdomen is flat.     Palpations: Abdomen is soft.     Tenderness: There is no abdominal tenderness.  Musculoskeletal:        General: No swelling or tenderness.  Skin:     Coloration: Skin is not jaundiced or pale.  Neurological:     Mental Status: He is alert. Mental status is at baseline.  Psychiatric:        Mood and Affect: Mood normal.        Behavior: Behavior normal.   ss  Data Reviewed: I have personally reviewed following labs and imaging studies  CBC: Recent Labs  Lab 04/13/20 1003 04/13/20 1003 04/14/20 0336 04/15/20 0254 04/16/20 0743 04/17/20 0316 04/18/20 0318  WBC 15.9*   < > 16.6* 12.3* 11.5* 9.5 8.7  NEUTROABS 12.8*  --   --   --   --   --   --   HGB 16.5   < > 15.4 14.2 14.5 14.6 15.1  HCT 46.1   < > 45.0 42.1 42.4 42.6 44.2  MCV 85.7   < > 90.2 89.2 89.6 88.2 88.9  PLT 276   < > 258 227 256 287 314   < > = values in this interval not displayed.   Basic Metabolic Panel: Recent Labs  Lab 04/13/20 1003 04/14/20 0336 04/16/20 0743 04/17/20 0316 04/18/20 0318  NA 133* 135 134* 136 139  K 3.8 3.8 3.6 3.7 3.7  CL 101 102 101 104 104  CO2 25 24 24 26 26   GLUCOSE 132* 90 111* 105* 99  BUN 7 8 7  5* <5*  CREATININE 0.94 0.92 0.78 0.82 0.94  CALCIUM 8.8* 8.5* 8.6* 8.7* 9.0   GFR: Estimated Creatinine Clearance: 121.4 mL/min (by C-G formula based on SCr of 0.94 mg/dL). Liver Function Tests: Recent Labs  Lab 04/13/20 1003 04/14/20 0336  AST 20 23  ALT 31 31  ALKPHOS 73 60  BILITOT 1.8* 2.9*  PROT 7.4 6.9  ALBUMIN 4.2 3.8   No results for input(s): LIPASE, AMYLASE in the last 168 hours. No results for input(s): AMMONIA in the last 168 hours. Coagulation Profile: No results for input(s): INR, PROTIME in the last 168 hours. Cardiac Enzymes: No results for input(s): CKTOTAL, CKMB, CKMBINDEX, TROPONINI in the last 168 hours. BNP (last 3 results) No results for input(s): PROBNP in the last 8760 hours. HbA1C: No results for input(s): HGBA1C in the last 72 hours. CBG: No results for input(s): GLUCAP in the last 168 hours. Lipid Profile: No results for input(s): CHOL, HDL, LDLCALC, TRIG, CHOLHDL, LDLDIRECT in the  last 72 hours. Thyroid Function Tests: No results for input(s): TSH, T4TOTAL, FREET4, T3FREE, THYROIDAB in the last 72 hours. Anemia Panel: No results for input(s): VITAMINB12, FOLATE, FERRITIN, TIBC, IRON, RETICCTPCT in the last 72 hours. Sepsis Labs: No results for input(s): PROCALCITON,  LATICACIDVEN in the last 168 hours.  Recent Results (from the past 240 hour(s))  Urine culture     Status: None   Collection Time: 04/13/20 11:25 AM   Specimen: Urine, Random  Result Value Ref Range Status   Specimen Description   Final    URINE, RANDOM Performed at [facility], [address] [place], [place] [address]    Special Requests   Final    NONE Performed at [facility], [address] [place], [place] [address]    Culture   Final    NO GROWTH Performed at [facility], [place] [address] [place], [place] [address]    Report Status 04/14/2020 FINAL  Final  Respiratory Panel by RT PCR (Flu A&B, Covid) - Urine, Clean Catch     Status: None   Collection Time: 04/13/20 11:28 AM   Specimen: Urine, Clean Catch  Result Value Ref Range Status   SARS Coronavirus 2 by RT PCR NEGATIVE NEGATIVE Final    Comment: (NOTE) SARS-CoV-2 target nucleic acids are NOT DETECTED. The SARS-CoV-2 RNA is generally detectable in upper respiratoy specimens during the acute phase of infection. The lowest concentration of SARS-CoV-2 viral copies this assay can detect is 131 copies/mL. A negative result does not preclude SARS-Cov-2 infection and should not be used as the sole basis for treatment or other patient management decisions. A negative result may occur with  improper specimen collection/handling, submission of specimen other than nasopharyngeal swab, presence of viral mutation(s) within the areas targeted by this assay, and inadequate number of viral copies (<131 copies/mL). A negative result must be combined with clinical observations, patient history, and epidemiological  information. The expected result is Negative. Fact Sheet for Patients:  PinkCheek.be Fact Sheet for Healthcare Providers:  GravelBags.it This test is not yet ap proved or cleared by the [place] FDA and  has been authorized for detection and/or diagnosis of SARS-CoV-2 by FDA under an Emergency Use Authorization (EUA). This EUA will remain  in effect (meaning this test can be used) for the duration of the COVID-19 declaration under Section 564(b)(1) of the Act, 21 U.S.C. section 360bbb-3(b)(1), unless the authorization is terminated or revoked sooner.    Influenza A by PCR NEGATIVE NEGATIVE Final   Influenza B by PCR NEGATIVE NEGATIVE Final    Comment: (NOTE) The Xpert Xpress SARS-CoV-2/FLU/RSV assay is intended as an aid in  the diagnosis of influenza from Nasopharyngeal swab specimens and  should not be used as a sole basis for treatment. Nasal washings and  aspirates are unacceptable for Xpert Xpress SARS-CoV-2/FLU/RSV  testing. Fact Sheet for Patients: PinkCheek.be Fact Sheet for Healthcare Providers: GravelBags.it This test is not yet approved or cleared by the [place] FDA and  has been authorized for detection and/or diagnosis of SARS-CoV-2 by  FDA under an Emergency Use Authorization (EUA). This EUA will remain  in effect (meaning this test can be used) for the duration of the  Covid-19 declaration under Section 564(b)(1) of the Act, 21  U.S.C. section 360bbb-3(b)(1), unless the authorization is  terminated or revoked. Performed at [facility], [address] [place], [place] [address]          Radiology Studies: No results found.      Scheduled Meds: . acetaminophen  1,000 mg Oral Q6H  . amLODipine  10 mg Oral Daily  . enoxaparin (LOVENOX) injection  50 mg Subcutaneous Q24H  . lidocaine  1  patch Transdermal Q24H  .  loratadine  10 mg Oral Daily  . metoprolol succinate  50 mg Oral Daily  . multivitamin with minerals  1 tablet Oral Daily  . omega-3 acid ethyl esters  1 g Oral Daily  . pantoprazole  40 mg Oral BID  . saccharomyces boulardii  250 mg Oral BID   Continuous Infusions: . sodium chloride 50 mL/hr at 04/18/20 0300  . piperacillin-tazobactam (ZOSYN)  IV 3.375 g (04/18/20 1021)     Time spent: 54minutes with over 50% of the time coordinating the patient's care    Harold Hedge, DO [facility] Pager [phone]  Call night coverage person covering after 7pm

## 2020-04-18 NOTE — Plan of Care (Signed)
  Problem: Clinical Measurements: Goal: Diagnostic test results will improve Outcome: Progressing   Problem: Nutrition: Goal: Adequate nutrition will be maintained Outcome: Progressing   Problem: Elimination: Goal: Will not experience complications related to bowel motility Outcome: Progressing   Problem: Pain Managment: Goal: General experience of comfort will improve Outcome: Progressing

## 2020-04-18 NOTE — Progress Notes (Signed)
NUTRITION NOTE  Consult received for low fiber/low residue diet education. Patient is currently on a Soft diet (advanced from FLD to Soft today at 0925).   Will place handouts from the Academy of Nutrition and Dietetics' Nutrition Care Manual in Discharge Instructions: "Low Fiber Nutrition Therapy" and "Lower Fiber Foods List."   Patient diagnosed with sigmoid diverticulitis without abscess. Surgery note from this AM states hopeful for discharge home tomorrow (5/1).    Jarome Matin, MS, RD, LDN, CNSC Inpatient Clinical Dietitian RD pager # available in [place]  After hours/weekend pager # available in [facility]

## 2020-04-19 LAB — CBC
HCT: 45.6 % (ref 39.0–52.0)
Hemoglobin: 15.6 g/dL (ref 13.0–17.0)
MCH: 30.1 pg (ref 26.0–34.0)
MCHC: 34.2 g/dL (ref 30.0–36.0)
MCV: 87.9 fL (ref 80.0–100.0)
Platelets: 385 10*3/uL (ref 150–400)
RBC: 5.19 MIL/uL (ref 4.22–5.81)
RDW: 12.2 % (ref 11.5–15.5)
WBC: 8.2 10*3/uL (ref 4.0–10.5)
nRBC: 0 % (ref 0.0–0.2)

## 2020-04-19 MED ORDER — AMOXICILLIN-POT CLAVULANATE 875-125 MG PO TABS
1.0000 | ORAL_TABLET | Freq: Two times a day (BID) | ORAL | 1 refills | Status: DC
Start: 2020-04-19 — End: 2020-05-03

## 2020-04-19 NOTE — Discharge Instructions (Signed)
Completed antibiotics to help get this attack of diverticulitis resolved.  Augmentin x 7 more days  Consider colonoscopy at some point to rule out any other colon etiologies despite diverticulitis.  Reasonable to discuss with your gastroenterologist, Dr. Alice Reichert  Consider elective colon resection to break the cycle of attacks of diverticulitis.  Can follow-up with [place] surgery/Dr. [name] or others.   Low-Fiber (8 grams) Nutrition Therapy You may need a low-fiber diet if you have Crohn's disease, diverticulitis, gastroparesis, or ulcerative colitis.  A low-fiber diet may also be needed following radiation therapy to the pelvis and lower bowel or recent intestinal surgery. A low-fiber diet reduces the frequency and volume of your stools.  This lessens irritation to the gastrointestinal (GI) tract and can help you heal. Use this diet if you have a stricture so your intestine doesn't get blocked.  The goal of this diet is to get less than 8 grams of fiber daily.  It's also important to eat enough protein foods while you are on a low-fiber diet. Drink nutrition supplements that have 1 gram of fiber or less in each serving.  If your stricture is severe or if your inflammation is severe, drink more liquids to reduce symptoms and to get enough calories and protein.  Tips Eat about 5 to 6 small meals daily or about every 3 to 4 hours.  Do not skip meals. Every time you eat, include a small amount of protein (1 to 2 ounces) plus an additional food.  Low fiber starch foods are the best choice to eat with protein. Limit acidic, spicy and high-fat or fried and greasy foods to reduce GI symptoms. Do not eat raw fruits and vegetables while on this diet.  All fruits and vegetables need to be cooked and without peels or skins. Drink a lot of fluids, at least 8 cups of fluid each day.  Limit drinks with caffeine, sugar, and sugar substitutes. Plain water is the best choice. Avoid mixing drink  packets or flavor drops into water. Take a chewable multivitamin with minerals.  Gummy vitamins do not have enough minerals and can block an ostomy and non-chewable supplements are not easily digested.  Chewable supplements must be used if you have a stricture or ostomy. If you are lactose intolerant, you may need to eat low-lactose dairy products.  Do not take a calcium supplement. They can cause a blockage. It is important to add high-calcium foods gradually to your diet and monitor for symptoms to avoid a blockage. Do not add more fiber to your diet until your health care provider or tells you it's OK.  Fiber is part of whole grains, fruits and vegetables (foods from plants) and needs to be slowly added back into your diet when your body is healed. Choose foods that have been safely handled and prepared to lower your risk of foodborne illness.   Foods Recommended These foods are low in fat and fiber and will help with your GI symptoms.  Grains  Choose grain foods with less than 2 grams of fiber per serving. Refined white flour products--for example, enriched white bread without seeds, crackers or pasta Cream of wheat or rice Grits (fine ground) Tortillas: white flour or corn White rice, well-cooked (do not rinse, or soak before cooking) Cold and hot cereals made from white or refined flour such as crispy rice or corn flakes Protein Foods Lean, very tender, well-cooked poultry or fish; red meats: beef, pork or lamb (slow cook until soft; chop meats  if you have stricture or ostomy) Eggs, well-cooked Smooth nut butters such as almond, peanut, or sunflower Tofu Dairy  If you have lactose intolerance, drinking milk products from cows or goats may make diarrhea worse. Foods marked with an asterisk (*) have lactose. Milk: fat-free, 1% or 2% * (choose best tolerated) Lactose-free milk Buttermilk* Fortified non-dairy milks: almond, cashew, coconut, or rice (be aware that these options are  not good sources of protein so you will need to eat an additional protein food) Kefir* (Don't include kefir in the diet until approved by your health care provider) Yogurt*/lactose-free yogurt (without nuts, fruit, granola or chocolate) Mild cheese* (hard and aged cheeses tend to be lower in lactose such as cheddar, swiss or parmesan) Cottage cheese* or lactose-free cottage cheese Low-fat ice cream* or lactose-free ice cream Sherbet* (usually lower lactose) Vegetables  Canned and well-cooked vegetables without seeds, skins, or hulls Carrots or green beans, cooked White, red or yellow potatoes without skins Strained vegetable juice Fruit  Soft, and well-cooked fruits without skins, seeds, or membranes Canned fruit in juice: peaches, pears, or applesauce Fruit juice without pulp diluted by half with water may be tolerated better Fruit drinks fortified with vitamin C may be tolerated better than 100% fruit juice Oils  When possible, choose healthy oils and fats, such as olive and canola oils, plant oils rather than solid fats. Other  Broth and strained soups made from allowed foods Desserts (small portions) without whole grains, seeds, nuts, raisins, or coconut Jelly (clear)  Foods Not Recommended These foods are higher in fat and fiber and may make your GI symptoms worse.  Grains Bread, whole wheat or with whole grain flour or seeds or nuts Brown rice, quinoa, kasha, barley Tortillas: whole grain Whole wheat pasta Whole grain and high-fiber cereals, including oatmeal, bran flakes or shredded wheat Popcorn Protein Foods Steak, pork chops, or other meats that are fatty or have gristle Fried meat, poultry, or fish Seafood with a tough or rubbery texture, such as shrimp Luncheon meats such as bologna and salami Sausage, bacon, or hot dogs Dried beans, peas, or lentils Hummus Sushi Nuts and chunky nut butters Dairy  Whole milk Pea milk and soymilk (may cause diarrhea, gas,  bloating, and abdominal pain) Cream Half-and-half Sour cream Yogurt with added fruit, nuts, or granola or chocolate Vegetables  Alfalfa or bean sprouts (high fiber and risk for bacteria) Raw or undercooked vegetables: beets; broccoli; brussels sprouts; cabbage; cauliflower; collard, mustard, or turnip greens; corn; cucumber; green peas or any kind of peas; kale; lima beans; mushrooms; okra; olives; pickles and relish; onions; parsnips; peppers; potato skins; sauerkraut; spinach; tomatoes Fruit  Raw fruit Dried fruit Avocado, berries, coconut Canned fruit in syrup Canned fruit with mandarin oranges, papaya or pineapple Fruit juice with pulp Prune juice Fruit skin Oils  Pork rinds    Lower-Fiber Foods List Food Amount Beef, poultry, fish 3 oz Bread, white 1 slice Cheese (all types) 1 oz Cottage cheese  cup Cream of wheat, instant  cup Egg 1 whole Fruit juice  cup (4 oz) Green beans, canned  cup Ice cream  cup Lactose-free milk 1 cup (8 oz) Lettuce (all types) 1 cup Mashed potatoes  cup Milk (all types) 1 cup (8 oz) Nut butters, smooth (peanut, soy, almond, or sunflower) 2 Tbsp Pasta, white  cup Peaches, canned  cup Pears, canned  cup Pudding or tapioca  cup Rice, white  cup Soy milk, rice milk, or almond milk 1 cup (8 oz) Spinach  1 cup raw  cup cooked Tofu  cup Tuna, canned 3 oz Yogurt 6 oz Soy yogurt 6 oz

## 2020-04-19 NOTE — Discharge Summary (Signed)
Physician Discharge Summary  Jordan Buchanan [id number] DOB: Apr 23, 1971   PCP: Ria Bush, MD  Admit date: 04/13/2020 Discharge date: 04/19/2020 Length of Stay: 6 days   Code Status: Full Code  Admitted From:  Home Discharged to:   [place]:  None  Equipment/Devices:  None Discharge Condition:  Stable  Recommendations for Outpatient Follow-up   1. Follow up with general surgery, may need  elective segmental colonic resection 2. Needs to schedule an appointment with GI to get a colonoscopy in 6-8 weeks  Hospital Summary  This is a 49 year old male with history of hypertension, obesity, GERD, migraine, nephrolithiasis, past abdominal surgeries who presented to [facility] with abdominal pain and right flank pain worse on exertion with nausea and dysuria and increased urinary frequency.  In the ED was found to have leukocytosis.  CT scan of the abdomen showed focal perforation in the mid sigmoid colon without abscess.  General surgery was consulted and patient was transferred to [facility] for further management.  He has since completed 7 days of IV antibiotics.  Throughout his course he had intermittent worsening symptoms with advancement of diet.  Patient was able to advance his diet on 5/1 without any recurrence of symptoms.    Per Dr. Jerrell Belfast, general surgery, patient may need elective segmental colonic resection once through this acute.  He will also need a colonoscopy in the next 6 to 8 weeks to rule out malignancy.  GI referrals have been placed in the past so patient should follow-up with his previous referral.  He is discharged with 7 additional days of Augmentin.   A & P   Principal Problem:   Diverticulitis of colon with perforation Active Problems:   Hypertension   OSA (obstructive sleep apnea)   1. Acute sigmoid diverticulitis with focal perforation a. Tolerated advancement of diet b. encourage ambulation   c. Discussed case with Dr. Harlow Asa who is okay with patient being  discharged today with antibiotics d. Discharged with additional 7 days of Augmentin twice daily and will need follow-up with general surgery and GI in the future  2. Hypertension a. Continue Norvasc and metoprolol  3. GERD on Protonix  4. Obesity a. Body mass index is 34.44 kg/m.    Consultants  . General surgery  Procedures  . None  Antibiotics   Anti-infectives (From admission, onward)   Start     Dose/Rate Route Frequency Ordered Stop   04/19/20 0000  amoxicillin-clavulanate (AUGMENTIN) 875-125 MG tablet     1 tablet Oral 2 times daily 04/19/20 1155     04/13/20 1700  piperacillin-tazobactam (ZOSYN) IVPB 3.375 g     3.375 g 12.5 mL/hr over 240 Minutes Intravenous Every 8 hours 04/13/20 1214     04/13/20 1115  piperacillin-tazobactam (ZOSYN) IVPB 3.375 g     3.375 g 100 mL/hr over 30 Minutes Intravenous  Once 04/13/20 1105 04/13/20 1130       Subjective  Patient seen and examined at bedside no acute distress and resting comfortably.  Last night patient had epigastric pain which resolved patient slept comfortably.  He is now tolerating diet.  He was frustrated this morning that he was still hospitalized.  Today he was able to tolerate grilled cheese sandwich without any recurrence of symptoms.  Discussed case with Dr. Harlow Asa who is okay with patient being discharged today with antibiotics  Denies any chest pain, shortness of breath, fever, nausea, vomiting, urinary or bowel complaints. Otherwise ROS negative    Objective  Discharge Exam: Vitals:   04/19/20 0619 04/19/20 1352  BP: (!) 138/99 136/88  Pulse: 68 63  Resp: 18 20  Temp: 97.7 F (36.5 C) (!) 97.5 F (36.4 C)  SpO2: 97% 97%   Vitals:   04/18/20 1449 04/18/20 2159 04/19/20 0619 04/19/20 1352  BP: 129/82 (!) 145/98 (!) 138/99 136/88  Pulse: 71 75 68 63  Resp: 16 16 18 20   Temp: 98.3 F (36.8 C) 98.4 F (36.9 C) 97.7 F (36.5 C) (!) 97.5 F (36.4 C)  TempSrc: Oral Oral Oral Oral  SpO2: 97%  99% 97% 97%  Weight:      Height:        Physical Exam Vitals and nursing note reviewed.  Constitutional:      Appearance: Normal appearance.  HENT:     Head: Normocephalic and atraumatic.  Eyes:     Conjunctiva/sclera: Conjunctivae normal.  Cardiovascular:     Rate and Rhythm: Normal rate and regular rhythm.  Pulmonary:     Effort: Pulmonary effort is normal.     Breath sounds: Normal breath sounds.  Abdominal:     General: Abdomen is flat.     Palpations: Abdomen is soft.  Musculoskeletal:        General: No swelling or tenderness.  Skin:    Coloration: Skin is not jaundiced or pale.  Neurological:     Mental Status: He is alert. Mental status is at baseline.  Psychiatric:        Mood and Affect: Mood normal.        Behavior: Behavior normal.       The results of significant diagnostics from this hospitalization (including imaging, microbiology, ancillary and laboratory) are listed below for reference.     Microbiology: Recent Results (from the past 240 hour(s))  Urine culture     Status: None   Collection Time: 04/13/20 11:25 AM   Specimen: Urine, Random  Result Value Ref Range Status   Specimen Description   Final    URINE, RANDOM Performed at [facility], [address] [place], [place] [address]    Special Requests   Final    NONE Performed at [facility], [address] [place], [place] [address]    Culture   Final    NO GROWTH Performed at [facility], [place] [address], [place], [place] [address]    Report Status 04/14/2020 FINAL  Final  Respiratory Panel by RT PCR (Flu A&B, Covid) - Urine, Clean Catch     Status: None   Collection Time: 04/13/20 11:28 AM   Specimen: Urine, Clean Catch  Result Value Ref Range Status   SARS Coronavirus 2 by RT PCR NEGATIVE NEGATIVE Final    Comment: (NOTE) SARS-CoV-2 target nucleic acids are NOT DETECTED. The SARS-CoV-2 RNA is generally detectable in upper respiratoy specimens  during the acute phase of infection. The lowest concentration of SARS-CoV-2 viral copies this assay can detect is 131 copies/mL. A negative result does not preclude SARS-Cov-2 infection and should not be used as the sole basis for treatment or other patient management decisions. A negative result may occur with  improper specimen collection/handling, submission of specimen other than nasopharyngeal swab, presence of viral mutation(s) within the areas targeted by this assay, and inadequate number of viral copies (<131 copies/mL). A negative result must be combined with clinical observations, patient history, and epidemiological information. The expected result is Negative. Fact Sheet for Patients:  PinkCheek.be Fact Sheet  for Healthcare Providers:  GravelBags.it This test is not yet ap proved or cleared by the [place] and  has been authorized for detection and/or diagnosis of SARS-CoV-2 by FDA under an Emergency Use Authorization (EUA). This EUA will remain  in effect (meaning this test can be used) for the duration of the COVID-19 declaration under Section 564(b)(1) of the Act, 21 U.S.C. section 360bbb-3(b)(1), unless the authorization is terminated or revoked sooner.    Influenza A by PCR NEGATIVE NEGATIVE Final   Influenza B by PCR NEGATIVE NEGATIVE Final    Comment: (NOTE) The Xpert Xpress SARS-CoV-2/FLU/RSV assay is intended as an aid in  the diagnosis of influenza from Nasopharyngeal swab specimens and  should not be used as a sole basis for treatment. Nasal washings and  aspirates are unacceptable for Xpert Xpress SARS-CoV-2/FLU/RSV  testing. Fact Sheet for Patients: PinkCheek.be Fact Sheet for Healthcare Providers: GravelBags.it This test is not yet approved or cleared by the [place] FDA and  has been authorized for detection and/or diagnosis of  SARS-CoV-2 by  FDA under an Emergency Use Authorization (EUA). This EUA will remain  in effect (meaning this test can be used) for the duration of the  Covid-19 declaration under Section 564(b)(1) of the Act, 21  U.S.C. section 360bbb-3(b)(1), unless the authorization is  terminated or revoked. Performed at [facility], [address] [place], [place] [address]      Labs: BNP (last 3 results) No results for input(s): BNP in the last 8760 hours. Basic Metabolic Panel: Recent Labs  Lab 04/13/20 1003 04/14/20 0336 04/16/20 0743 04/17/20 0316 04/18/20 0318  NA 133* 135 134* 136 139  K 3.8 3.8 3.6 3.7 3.7  CL 101 102 101 104 104  CO2 25 24 24 26 26   GLUCOSE 132* 90 111* 105* 99  BUN 7 8 7  5* <5*  CREATININE 0.94 0.92 0.78 0.82 0.94  CALCIUM 8.8* 8.5* 8.6* 8.7* 9.0   Liver Function Tests: Recent Labs  Lab 04/13/20 1003 04/14/20 0336  AST 20 23  ALT 31 31  ALKPHOS 73 60  BILITOT 1.8* 2.9*  PROT 7.4 6.9  ALBUMIN 4.2 3.8   No results for input(s): LIPASE, AMYLASE in the last 168 hours. No results for input(s): AMMONIA in the last 168 hours. CBC: Recent Labs  Lab 04/13/20 1003 04/14/20 0336 04/15/20 0254 04/16/20 0743 04/17/20 0316 04/18/20 0318 04/19/20 0816  WBC 15.9*   < > 12.3* 11.5* 9.5 8.7 8.2  NEUTROABS 12.8*  --   --   --   --   --   --   HGB 16.5   < > 14.2 14.5 14.6 15.1 15.6  HCT 46.1   < > 42.1 42.4 42.6 44.2 45.6  MCV 85.7   < > 89.2 89.6 88.2 88.9 87.9  PLT 276   < > 227 256 287 314 385   < > = values in this interval not displayed.   Cardiac Enzymes: No results for input(s): CKTOTAL, CKMB, CKMBINDEX, TROPONINI in the last 168 hours. BNP: Invalid input(s): POCBNP CBG: No results for input(s): GLUCAP in the last 168 hours. D-Dimer No results for input(s): DDIMER in the last 72 hours. Hgb A1c No results for input(s): HGBA1C in the last 72 hours. Lipid Profile No results for input(s): CHOL, HDL, LDLCALC, TRIG, CHOLHDL, LDLDIRECT  in the last 72 hours. Thyroid function studies No results for input(s): TSH, T4TOTAL, T3FREE, THYROIDAB in the last 72 hours.  Invalid input(s):  FREET3 Anemia work up No results for input(s): VITAMINB12, FOLATE, FERRITIN, TIBC, IRON, RETICCTPCT in the last 72 hours. Urinalysis    Component Value Date/Time   COLORURINE YELLOW 04/13/2020 1125   APPEARANCEUR CLEAR 04/13/2020 1125   LABSPEC 1.015 04/13/2020 1125   PHURINE 7.5 04/13/2020 1125   GLUCOSEU NEGATIVE 04/13/2020 1125   HGBUR NEGATIVE 04/13/2020 1125   BILIRUBINUR NEGATIVE 04/13/2020 1125   BILIRUBINUR negative 01/31/2018 1548   KETONESUR NEGATIVE 04/13/2020 1125   PROTEINUR NEGATIVE 04/13/2020 1125   UROBILINOGEN 0.2 01/31/2018 1548   UROBILINOGEN 0.2 09/13/2013 2134   NITRITE NEGATIVE 04/13/2020 1125   LEUKOCYTESUR NEGATIVE 04/13/2020 1125   Sepsis Labs Invalid input(s): PROCALCITONIN,  WBC,  LACTICIDVEN Microbiology Recent Results (from the past 240 hour(s))  Urine culture     Status: None   Collection Time: 04/13/20 11:25 AM   Specimen: Urine, Random  Result Value Ref Range Status   Specimen Description   Final    URINE, RANDOM Performed at [facility], [address] [place], [place] [address]    Special Requests   Final    NONE Performed at [facility], [address] [place], [place] [address]    Culture   Final    NO GROWTH Performed at [facility], [place] [address] [place], [place] [address]    Report Status 04/14/2020 FINAL  Final  Respiratory Panel by RT PCR (Flu A&B, Covid) - Urine, Clean Catch     Status: None   Collection Time: 04/13/20 11:28 AM   Specimen: Urine, Clean Catch  Result Value Ref Range Status   SARS Coronavirus 2 by RT PCR NEGATIVE NEGATIVE Final    Comment: (NOTE) SARS-CoV-2 target nucleic acids are NOT DETECTED. The SARS-CoV-2 RNA is generally detectable in upper respiratoy specimens during the acute phase of infection. The lowest concentration  of SARS-CoV-2 viral copies this assay can detect is 131 copies/mL. A negative result does not preclude SARS-Cov-2 infection and should not be used as the sole basis for treatment or other patient management decisions. A negative result may occur with  improper specimen collection/handling, submission of specimen other than nasopharyngeal swab, presence of viral mutation(s) within the areas targeted by this assay, and inadequate number of viral copies (<131 copies/mL). A negative result must be combined with clinical observations, patient history, and epidemiological information. The expected result is Negative. Fact Sheet for Patients:  PinkCheek.be Fact Sheet for Healthcare Providers:  GravelBags.it This test is not yet ap proved or cleared by the [place] FDA and  has been authorized for detection and/or diagnosis of SARS-CoV-2 by FDA under an Emergency Use Authorization (EUA). This EUA will remain  in effect (meaning this test can be used) for the duration of the COVID-19 declaration under Section 564(b)(1) of the Act, 21 U.S.C. section 360bbb-3(b)(1), unless the authorization is terminated or revoked sooner.    Influenza A by PCR NEGATIVE NEGATIVE Final   Influenza B by PCR NEGATIVE NEGATIVE Final    Comment: (NOTE) The Xpert Xpress SARS-CoV-2/FLU/RSV assay is intended as an aid in  the diagnosis of influenza from Nasopharyngeal swab specimens and  should not be used as a sole basis for treatment. Nasal washings and  aspirates are unacceptable for Xpert Xpress SARS-CoV-2/FLU/RSV  testing. Fact Sheet for Patients: PinkCheek.be Fact Sheet for Healthcare Providers: GravelBags.it This test is not yet approved or cleared by the [place] FDA and  has been authorized for detection and/or diagnosis of  SARS-CoV-2 by  FDA under an Emergency Use Authorization (EUA).  This EUA will remain  in effect (meaning this test can be used) for the duration of the  Covid-19 declaration under Section 564(b)(1) of the Act, 21  U.S.C. section 360bbb-3(b)(1), unless the authorization is  terminated or revoked. Performed at [facility], [address] [place], [place] [address]     Discharge Instructions     Discharge Instructions    DIET SOFT   Complete by: As directed    Diet - low sodium heart healthy   Complete by: As directed    Discharge instructions   Complete by: As directed    You were seen and examined in the hospital for diverticulitis and cared for by a hospitalist and surgeon  Upon Discharge:  - Take Augmentin twice daily for 7 days - follow up with the general surgeons to discuss any further surgical options in the future - you should make an appointment with your GI doctor to get a colonoscopy in 6-8 weeks   Request that your primary physician go over all hospital tests and procedures/radiological results at the follow up.   Please get all hospital records sent to your physician by signing a hospital release before you go home.   Read the complete instructions along with all the possible side effects for all the medicines you take and that have been prescribed to you. Take any new medicines after you have completely understood and accept all the possible adverse reactions/side effects.   If you have any questions about your discharge medications or the care you received while you were in the hospital, you can call the unit and asked to speak with the hospitalist on call. Once you are discharged, your primary care physician will handle any further medical issues. Please note that NO REFILLS for any discharge medications will be authorized, as it is imperative that you return to your primary care physician (or establish a relationship with a primary care physician if you do not have one) for your aftercare needs so that they can  reassess your need for medications and monitor your lab values.   Do not drive, operate heavy machinery, perform activities at heights, swimming or participation in water activities or provide baby sitting services if your were admitted for loss of consciousness/seizures or if you are on sedating medications including, but not limited to benzodiazepines, sleep medications, narcotic pain medications, etc., until you have been cleared to do so by a medical doctor.   Do not take more than prescribed medications.   Wear a seat belt while driving.  If you have smoked or chewed Tobacco in the last 2 years please stop smoking; also stop any regular Alcohol and/or any Recreational drug use including marijuana.  If you experience worsening of your admission symptoms or develop shortness of breath, chest pain, suicidal or homicidal thoughts or experience a life threatening emergency, you must seek medical attention immediately by calling 911 or calling your PCP immediately.   Increase activity slowly   Complete by: As directed      Allergies as of 04/19/2020      Reactions   Hctz [hydrochlorothiazide] Rash   Per derm rec stay off HCTZ (2018)      Medication List    TAKE these medications   albuterol 108 (90 Base) MCG/ACT inhaler Commonly known as: Ventolin HFA Inhale 2 puffs into the lungs every 4 (four) hours as needed for wheezing or shortness of  breath.   amLODipine 10 MG tablet Commonly known as: NORVASC Take 1 tablet (10 mg total) by mouth daily.   amoxicillin-clavulanate 875-125 MG tablet Commonly known as: AUGMENTIN Take 1 tablet by mouth 2 (two) times daily.   cetirizine 10 MG tablet Commonly known as: ZYRTEC Take 10 mg by mouth daily as needed for allergies.   diclofenac sodium 1 % Gel Commonly known as: VOLTAREN Apply 2 g topically 4 (four) times daily.   meloxicam 15 MG tablet Commonly known as: MOBIC TAKE 1 TABLET BY MOUTH EVERY DAY   metoprolol succinate 50 MG 24 hr  tablet Commonly known as: TOPROL-XL Take 1 tablet (50 mg total) by mouth daily. Take with or immediately following a meal.   multivitamin with minerals Tabs tablet Take 1 tablet by mouth daily.   omega-3 acid ethyl esters 1 g capsule Commonly known as: LOVAZA Take 1 g by mouth daily.   pantoprazole 40 MG tablet Commonly known as: PROTONIX Take 1 tablet by mouth 2 (two) times daily.      Follow-up Information    Michael Boston, MD. Schedule an appointment as soon as possible for a visit in 2 week(s).   Specialty: General Surgery Contact information: [address] [place] [address] [phone]          Allergies  Allergen Reactions  . Hctz [Hydrochlorothiazide] Rash    Per derm rec stay off HCTZ (2018)    Dispo: The patient is from: Home              Anticipated d/c is to: Home              Anticipated d/c date is: today              Patient currently is medically stable to d/c.        Time coordinating discharge: Over 30 minutes   SIGNED:   Harold Hedge, D.O. [facility] Pager: [phone]  04/19/2020, 3:30 PM

## 2020-04-19 NOTE — Progress Notes (Signed)
Jordan Buchanan [id number] 1971/06/07  CARE TEAM:  PCP: Ria Bush, MD  Outpatient Care Team: [name], [name], [name], MD as PCP - Cardiology (Cardiology)  Inpatient Treatment Team: Treatment Team: Attending Provider: Harold Hedge, MD; Consulting Physician: Alphonsa Overall, MD; Consulting Physician: Edison Pace, Md, MD; Rounding Team: Fatima Blank, MD; Technician: Gardiner Ramus, NT; Technician: Linda Hedges, NT; Registered Nurse: Charolette Child, RN   Problem List:   Active Problems:   Diverticulitis of colon with perforation   Perforated diverticulum      * No surgery found *      Assessment  Diverticulitis of colon with perforation Perforated diverticulum  [facility] Stay = 6 days)  Plan: Patient with recurrent diverticulitis.  Discharge with Augmentin x7 days. F/U colonoscopy scheduled in 6 weeks.  -continue advancing diet -oral Augmentin x7 days at discharge -VTE prophylaxis- SCDs, etc -mobilize as tolerated to help recovery -Patient to follow up in our office  30 minutes spent in review, evaluation, examination, counseling, and coordination of care.  More than 50% of that time was spent in counseling.  04/19/2020  D/C patient from hospital when patient meets criteria (anticipate in 0-1 day(s)):  Tolerating oral intake well Ambulating well Adequate pain control without IV medications Urinating  Having flatus Disposition planning in place    Subjective: (Chief complaint)  Patient overall feeling well. Some pain with soft diet, but has since resolved.  He is enthusiastic about discharge.   Objective:  Vital signs:  Vitals:   04/18/20 1013 04/18/20 1449 04/18/20 2159 04/19/20 0619  BP: (!) 143/95 129/82 (!) 145/98 (!) 138/99  Pulse: 82 71 75 68  Resp:  16 16 18   Temp: 98.4 F (36.9 C) 98.3 F (36.8 C) 98.4 F (36.9 C) 97.7 F (36.5 C)  TempSrc: Oral Oral Oral  Oral  SpO2: 97% 97% 99% 97%  Weight:      Height:        Last BM Date: (P) 04/18/20  Intake/Output   Yesterday:  04/30 0701 - 05/01 0700 In: 1172.3 [P.O.:480; I.V.:603.8; IV Piggyback:88.5] Out: -  This shift:  Total I/O In: 120 [P.O.:120] Out: -   Bowel function:  Flatus: YES  BM:  YES  Drain: (No drain)   Physical Exam:  General: Pt awake/alert in no acute distress Eyes: PERRL, normal EOM.  Sclera clear.  No icterus Neuro: CN II-XII intact w/o focal sensory/motor deficits. Lymph: No head/neck/groin lymphadenopathy Psych:  No delerium/psychosis/paranoia.  Oriented x 4 HENT: Normocephalic, Mucus membranes moist.  No thrush Neck: Supple, No tracheal deviation.  No obvious thyromegaly Chest: No pain to chest wall compression.  Good respiratory excursion.  No audible wheezing CV:  Pulses intact.  Regular rhythm.  No major extremity edema MS: Normal AROM mjr joints.  No obvious deformity  Abdomen: Soft.  Nondistended.  Nontender.  No evidence of peritonitis.  No incarcerated hernias.  Ext:   No deformity.  No mjr edema.  No cyanosis Skin: No petechiae / purpurea.  No major sores.  Warm and dry    Results:   Cultures: Recent Results (from the past 720 hour(s))  Urine culture     Status: None   Collection Time: 04/13/20 11:25 AM   Specimen: Urine, Random  Result Value Ref Range Status   Specimen Description   Final    URINE, RANDOM Performed at [facility], [address], [place], [place] [address]  Special Requests   Final    NONE Performed at [facility], [address] [place], [place] [address]    Culture   Final    NO GROWTH Performed at [facility], [place] [address] [place], [place] [address]    Report Status 04/14/2020 FINAL  Final  Respiratory Panel by RT PCR (Flu A&B, Covid) - Urine, Clean Catch     Status: None   Collection Time: 04/13/20 11:28 AM   Specimen: Urine, Clean Catch  Result Value Ref Range Status    SARS Coronavirus 2 by RT PCR NEGATIVE NEGATIVE Final    Comment: (NOTE) SARS-CoV-2 target nucleic acids are NOT DETECTED. The SARS-CoV-2 RNA is generally detectable in upper respiratoy specimens during the acute phase of infection. The lowest concentration of SARS-CoV-2 viral copies this assay can detect is 131 copies/mL. A negative result does not preclude SARS-Cov-2 infection and should not be used as the sole basis for treatment or other patient management decisions. A negative result may occur with  improper specimen collection/handling, submission of specimen other than nasopharyngeal swab, presence of viral mutation(s) within the areas targeted by this assay, and inadequate number of viral copies (<131 copies/mL). A negative result must be combined with clinical observations, patient history, and epidemiological information. The expected result is Negative. Fact Sheet for Patients:  PinkCheek.be Fact Sheet for Healthcare Providers:  GravelBags.it This test is not yet ap proved or cleared by the [place] FDA and  has been authorized for detection and/or diagnosis of SARS-CoV-2 by FDA under an Emergency Use Authorization (EUA). This EUA will remain  in effect (meaning this test can be used) for the duration of the COVID-19 declaration under Section 564(b)(1) of the Act, 21 U.S.C. section 360bbb-3(b)(1), unless the authorization is terminated or revoked sooner.    Influenza A by PCR NEGATIVE NEGATIVE Final   Influenza B by PCR NEGATIVE NEGATIVE Final    Comment: (NOTE) The Xpert Xpress SARS-CoV-2/FLU/RSV assay is intended as an aid in  the diagnosis of influenza from Nasopharyngeal swab specimens and  should not be used as a sole basis for treatment. Nasal washings and  aspirates are unacceptable for Xpert Xpress SARS-CoV-2/FLU/RSV  testing. Fact Sheet for Patients: PinkCheek.be Fact  Sheet for Healthcare Providers: GravelBags.it This test is not yet approved or cleared by the [place] FDA and  has been authorized for detection and/or diagnosis of SARS-CoV-2 by  FDA under an Emergency Use Authorization (EUA). This EUA will remain  in effect (meaning this test can be used) for the duration of the  Covid-19 declaration under Section 564(b)(1) of the Act, 21  U.S.C. section 360bbb-3(b)(1), unless the authorization is  terminated or revoked. Performed at [facility], [address] [place], [place] [address]     Labs: Results for orders placed or performed during the hospital encounter of 04/13/20 (from the past 48 hour(s))  CBC     Status: None   Collection Time: 04/18/20  3:18 AM  Result Value Ref Range   WBC 8.7 4.0 - 10.5 K/uL   RBC 4.97 4.22 - 5.81 MIL/uL   Hemoglobin 15.1 13.0 - 17.0 g/dL   HCT 44.2 39.0 - 52.0 %   MCV 88.9 80.0 - 100.0 fL   MCH 30.4 26.0 - 34.0 pg   MCHC 34.2 30.0 - 36.0 g/dL   RDW 12.4 11.5 - 15.5 %   Platelets 314 150 - 400 K/uL   nRBC 0.0 0.0 -  0.2 %    Comment: Performed at [facility], [place] [address] [place], [place] [id number]  Basic metabolic panel     Status: Abnormal   Collection Time: 04/18/20  3:18 AM  Result Value Ref Range   Sodium 139 135 - 145 mmol/L   Potassium 3.7 3.5 - 5.1 mmol/L   Chloride 104 98 - 111 mmol/L   CO2 26 22 - 32 mmol/L   Glucose, Bld 99 70 - 99 mg/dL    Comment: Glucose reference range applies only to samples taken after fasting for at least 8 hours.   BUN <5 (L) 6 - 20 mg/dL   Creatinine, Ser 0.94 0.61 - 1.24 mg/dL   Calcium 9.0 8.9 - 10.3 mg/dL   GFR calc non Af Amer >60 >60 mL/min   GFR calc Af Amer >60 >60 mL/min   Anion gap 9 5 - 15    Comment: Performed at [facility], [place] [address], [place], [place] [address]  CBC     Status: None   Collection Time: 04/19/20  8:16 AM  Result Value Ref Range   WBC 8.2 4.0  - 10.5 K/uL   RBC 5.19 4.22 - 5.81 MIL/uL   Hemoglobin 15.6 13.0 - 17.0 g/dL   HCT 45.6 39.0 - 52.0 %   MCV 87.9 80.0 - 100.0 fL   MCH 30.1 26.0 - 34.0 pg   MCHC 34.2 30.0 - 36.0 g/dL   RDW 12.2 11.5 - 15.5 %   Platelets 385 150 - 400 K/uL   nRBC 0.0 0.0 - 0.2 %    Comment: Performed at [facility], [place] [address] [place], [place] [address]    Imaging / Studies: No results found.  Medications / Allergies: per chart  Antibiotics: Anti-infectives (From admission, onward)   Start     Dose/Rate Route Frequency Ordered Stop   04/13/20 1700  piperacillin-tazobactam (ZOSYN) IVPB 3.375 g     3.375 g 12.5 mL/hr over 240 Minutes Intravenous Every 8 hours 04/13/20 1214     04/13/20 1115  piperacillin-tazobactam (ZOSYN) IVPB 3.375 g     3.375 g 100 mL/hr over 30 Minutes Intravenous  Once 04/13/20 1105 04/13/20 1130        Note: Portions of this report may have been transcribed using voice recognition software. Every effort was made to ensure accuracy; however, inadvertent computerized transcription errors may be present.   Any transcriptional errors that result from this process are unintentional.     Adin Hector, MD, FACS, MASCRS Gastrointestinal and Minimally Invasive Surgery    [address], [place], [place] [address] [phone] Main / Paging [phone] Fax Please see Amion for pager number, especial 5pm - 7am.

## 2020-04-21 ENCOUNTER — Encounter: Payer: Self-pay | Admitting: Family Medicine

## 2020-04-28 ENCOUNTER — Emergency Department (HOSPITAL_COMMUNITY)
Admission: EM | Admit: 2020-04-28 | Discharge: 2020-04-29 | Disposition: A | Discharge status: Home / Self Care | Payer: BC Managed Care – PPO | Source: Home / Self Care

## 2020-04-28 ENCOUNTER — Encounter (HOSPITAL_COMMUNITY): Payer: Self-pay

## 2020-04-28 ENCOUNTER — Other Ambulatory Visit: Payer: Self-pay

## 2020-04-28 DIAGNOSIS — Z5321 Procedure and treatment not carried out due to patient leaving prior to being seen by health care provider: Secondary | ICD-10-CM | POA: Insufficient documentation

## 2020-04-28 DIAGNOSIS — R1084 Generalized abdominal pain: Secondary | ICD-10-CM | POA: Insufficient documentation

## 2020-04-28 LAB — CBC
HCT: 44.9 % (ref 39.0–52.0)
Hemoglobin: 15.7 g/dL (ref 13.0–17.0)
MCH: 30.8 pg (ref 26.0–34.0)
MCHC: 35 g/dL (ref 30.0–36.0)
MCV: 88 fL (ref 80.0–100.0)
Platelets: 414 10*3/uL — ABNORMAL HIGH (ref 150–400)
RBC: 5.1 MIL/uL (ref 4.22–5.81)
RDW: 12.1 % (ref 11.5–15.5)
WBC: 10.8 10*3/uL — ABNORMAL HIGH (ref 4.0–10.5)
nRBC: 0 % (ref 0.0–0.2)

## 2020-04-28 NOTE — ED Notes (Signed)
Pt asked for food & drink. Advised pt no food or drink prior to MD assessment. Per pt "I'm going to leave and go to the store then damnit." Advised pt if chooses to do so- will be documented as left AMA; will need to recheck in for assessment and assistance. Pt acknowledged/confirmed understanding. [place]eter asked to notify registration/RN if pt is observed leaving ED lobby to parking lot. [employer]

## 2020-04-28 NOTE — ED Triage Notes (Signed)
Dc from hospital last Saturday for diverticulitis. Taking Rx Augmentin as ordered. C/o severe abdominal pain same as before.

## 2020-04-28 NOTE — ED Notes (Signed)
Pt advised ;urine specimen needed when possible; pt provided labeled urine specimen cup. [employer]

## 2020-04-29 ENCOUNTER — Other Ambulatory Visit: Payer: Self-pay

## 2020-04-29 ENCOUNTER — Inpatient Hospital Stay (HOSPITAL_COMMUNITY): Payer: BC Managed Care – PPO

## 2020-04-29 ENCOUNTER — Inpatient Hospital Stay (HOSPITAL_BASED_OUTPATIENT_CLINIC_OR_DEPARTMENT_OTHER)
Admission: EM | Admit: 2020-04-29 | Discharge: 2020-05-03 | DRG: 345 | Disposition: A | Discharge status: Home / Self Care | Payer: BC Managed Care – PPO | Attending: Surgery | Admitting: Surgery

## 2020-04-29 ENCOUNTER — Encounter (HOSPITAL_BASED_OUTPATIENT_CLINIC_OR_DEPARTMENT_OTHER): Payer: Self-pay | Admitting: *Deleted

## 2020-04-29 ENCOUNTER — Emergency Department (HOSPITAL_BASED_OUTPATIENT_CLINIC_OR_DEPARTMENT_OTHER): Payer: BC Managed Care – PPO

## 2020-04-29 DIAGNOSIS — K5732 Diverticulitis of large intestine without perforation or abscess without bleeding: Secondary | ICD-10-CM | POA: Diagnosis not present

## 2020-04-29 DIAGNOSIS — K760 Fatty (change of) liver, not elsewhere classified: Secondary | ICD-10-CM | POA: Diagnosis present

## 2020-04-29 DIAGNOSIS — K7581 Nonalcoholic steatohepatitis (NASH): Secondary | ICD-10-CM | POA: Diagnosis not present

## 2020-04-29 DIAGNOSIS — E66811 Obesity, class 1: Secondary | ICD-10-CM | POA: Diagnosis present

## 2020-04-29 DIAGNOSIS — R1011 Right upper quadrant pain: Secondary | ICD-10-CM

## 2020-04-29 DIAGNOSIS — Z87891 Personal history of nicotine dependence: Secondary | ICD-10-CM

## 2020-04-29 DIAGNOSIS — K5792 Diverticulitis of intestine, part unspecified, without perforation or abscess without bleeding: Secondary | ICD-10-CM | POA: Diagnosis not present

## 2020-04-29 DIAGNOSIS — Z6833 Body mass index (BMI) 33.0-33.9, adult: Secondary | ICD-10-CM | POA: Diagnosis not present

## 2020-04-29 DIAGNOSIS — I10 Essential (primary) hypertension: Secondary | ICD-10-CM | POA: Diagnosis present

## 2020-04-29 DIAGNOSIS — G4733 Obstructive sleep apnea (adult) (pediatric): Secondary | ICD-10-CM | POA: Diagnosis present

## 2020-04-29 DIAGNOSIS — K8010 Calculus of gallbladder with chronic cholecystitis without obstruction: Secondary | ICD-10-CM | POA: Diagnosis present

## 2020-04-29 DIAGNOSIS — K915 Postcholecystectomy syndrome: Secondary | ICD-10-CM | POA: Diagnosis not present

## 2020-04-29 DIAGNOSIS — Z419 Encounter for procedure for purposes other than remedying health state, unspecified: Secondary | ICD-10-CM

## 2020-04-29 DIAGNOSIS — K828 Other specified diseases of gallbladder: Secondary | ICD-10-CM | POA: Diagnosis present

## 2020-04-29 DIAGNOSIS — E669 Obesity, unspecified: Secondary | ICD-10-CM | POA: Diagnosis not present

## 2020-04-29 DIAGNOSIS — Z8249 Family history of ischemic heart disease and other diseases of the circulatory system: Secondary | ICD-10-CM

## 2020-04-29 DIAGNOSIS — Z888 Allergy status to other drugs, medicaments and biological substances status: Secondary | ICD-10-CM

## 2020-04-29 DIAGNOSIS — K810 Acute cholecystitis: Secondary | ICD-10-CM | POA: Diagnosis not present

## 2020-04-29 DIAGNOSIS — R066 Hiccough: Secondary | ICD-10-CM | POA: Diagnosis not present

## 2020-04-29 DIAGNOSIS — Z91048 Other nonmedicinal substance allergy status: Secondary | ICD-10-CM | POA: Diagnosis not present

## 2020-04-29 DIAGNOSIS — K219 Gastro-esophageal reflux disease without esophagitis: Secondary | ICD-10-CM | POA: Diagnosis not present

## 2020-04-29 DIAGNOSIS — Z825 Family history of asthma and other chronic lower respiratory diseases: Secondary | ICD-10-CM

## 2020-04-29 DIAGNOSIS — N200 Calculus of kidney: Secondary | ICD-10-CM | POA: Diagnosis present

## 2020-04-29 DIAGNOSIS — Z79899 Other long term (current) drug therapy: Secondary | ICD-10-CM | POA: Diagnosis not present

## 2020-04-29 DIAGNOSIS — Z20822 Contact with and (suspected) exposure to covid-19: Secondary | ICD-10-CM | POA: Diagnosis present

## 2020-04-29 DIAGNOSIS — Z03818 Encounter for observation for suspected exposure to other biological agents ruled out: Secondary | ICD-10-CM | POA: Diagnosis not present

## 2020-04-29 DIAGNOSIS — K8000 Calculus of gallbladder with acute cholecystitis without obstruction: Secondary | ICD-10-CM | POA: Diagnosis not present

## 2020-04-29 DIAGNOSIS — K5720 Diverticulitis of large intestine with perforation and abscess without bleeding: Secondary | ICD-10-CM | POA: Diagnosis not present

## 2020-04-29 DIAGNOSIS — Z9049 Acquired absence of other specified parts of digestive tract: Secondary | ICD-10-CM

## 2020-04-29 DIAGNOSIS — K812 Acute cholecystitis with chronic cholecystitis: Secondary | ICD-10-CM | POA: Diagnosis not present

## 2020-04-29 DIAGNOSIS — G43909 Migraine, unspecified, not intractable, without status migrainosus: Secondary | ICD-10-CM | POA: Diagnosis not present

## 2020-04-29 LAB — D-DIMER, QUANTITATIVE: D-Dimer, Quant: 0.48 ug/mL-FEU (ref 0.00–0.50)

## 2020-04-29 LAB — URINALYSIS, ROUTINE W REFLEX MICROSCOPIC
Bilirubin Urine: NEGATIVE
Glucose, UA: NEGATIVE mg/dL
Hgb urine dipstick: NEGATIVE
Ketones, ur: NEGATIVE mg/dL
Leukocytes,Ua: NEGATIVE
Nitrite: NEGATIVE
Protein, ur: NEGATIVE mg/dL
Specific Gravity, Urine: 1.02 (ref 1.005–1.030)
pH: 6 (ref 5.0–8.0)

## 2020-04-29 LAB — COMPREHENSIVE METABOLIC PANEL
ALT: 29 U/L (ref 0–44)
AST: 23 U/L (ref 15–41)
Albumin: 4.1 g/dL (ref 3.5–5.0)
Alkaline Phosphatase: 66 U/L (ref 38–126)
Anion gap: 12 (ref 5–15)
BUN: 9 mg/dL (ref 6–20)
CO2: 28 mmol/L (ref 22–32)
Calcium: 9.6 mg/dL (ref 8.9–10.3)
Chloride: 103 mmol/L (ref 98–111)
Creatinine, Ser: 1.02 mg/dL (ref 0.61–1.24)
GFR calc Af Amer: >60 mL/min (ref >60)
GFR calc non Af Amer: >60 mL/min (ref >60)
Glucose, Bld: 119 mg/dL — ABNORMAL HIGH (ref 70–99)
Potassium: 4 mmol/L (ref 3.5–5.1)
Sodium: 143 mmol/L (ref 135–145)
Total Bilirubin: 0.7 mg/dL (ref 0.3–1.2)
Total Protein: 7.7 g/dL (ref 6.5–8.1)

## 2020-04-29 LAB — SARS CORONAVIRUS 2 BY RT PCR (HOSPITAL ORDER, PERFORMED IN ~~LOC~~ HOSPITAL LAB): SARS Coronavirus 2: NEGATIVE

## 2020-04-29 LAB — TROPONIN I (HIGH SENSITIVITY): Troponin I (High Sensitivity): 3 ng/L (ref <18)

## 2020-04-29 LAB — LIPASE, BLOOD: Lipase: 45 U/L (ref 11–51)

## 2020-04-29 LAB — LACTIC ACID, PLASMA: Lactic Acid, Venous: 1.3 mmol/L (ref 0.5–1.9)

## 2020-04-29 MED ORDER — GABAPENTIN 300 MG PO CAPS
300.0000 mg | ORAL_CAPSULE | ORAL | Status: AC
  Administered 2020-04-30: 300 mg via ORAL
  Filled 2020-04-29: qty 1

## 2020-04-29 MED ORDER — PIPERACILLIN-TAZOBACTAM 3.375 G IVPB
3.3750 g | Freq: Three times a day (TID) | INTRAVENOUS | Status: DC
  Administered 2020-04-29 – 2020-05-03 (×11): 3.375 g via INTRAVENOUS
  Filled 2020-04-29 (×15): qty 50

## 2020-04-29 MED ORDER — ACETAMINOPHEN 650 MG RE SUPP: 650.0000 mg | Freq: Four times a day (QID) | RECTAL | Status: DC | PRN

## 2020-04-29 MED ORDER — IOHEXOL 300 MG/ML  SOLN
100.0000 mL | Freq: Once | INTRAMUSCULAR | Status: AC | PRN
  Administered 2020-04-29: 100 mL via INTRAVENOUS

## 2020-04-29 MED ORDER — ALBUTEROL SULFATE (2.5 MG/3ML) 0.083% IN NEBU: 2.5000 mg | INHALATION_SOLUTION | RESPIRATORY_TRACT | Status: DC | PRN

## 2020-04-29 MED ORDER — SODIUM CHLORIDE 0.9 % IV SOLN: INTRAVENOUS | Status: DC

## 2020-04-29 MED ORDER — HYDROMORPHONE HCL 1 MG/ML IJ SOLN
1.0000 mg | Freq: Once | INTRAMUSCULAR | Status: AC
  Administered 2020-04-29: 1 mg via INTRAVENOUS
  Filled 2020-04-29: qty 1

## 2020-04-29 MED ORDER — ONDANSETRON HCL 4 MG/2ML IJ SOLN: 4.0000 mg | Freq: Four times a day (QID) | INTRAMUSCULAR | Status: DC | PRN

## 2020-04-29 MED ORDER — OMEGA-3-ACID ETHYL ESTERS 1 G PO CAPS
1.0000 g | ORAL_CAPSULE | Freq: Every day | ORAL | Status: DC
  Administered 2020-04-29 – 2020-05-03 (×4): 1 g via ORAL
  Filled 2020-04-29 (×4): qty 1

## 2020-04-29 MED ORDER — ONDANSETRON HCL 4 MG PO TABS: 4.0000 mg | ORAL_TABLET | Freq: Four times a day (QID) | ORAL | Status: DC | PRN

## 2020-04-29 MED ORDER — PIPERACILLIN-TAZOBACTAM 3.375 G IVPB 30 MIN
3.3750 g | Freq: Once | INTRAVENOUS | Status: AC
  Administered 2020-04-29: 3.375 g via INTRAVENOUS
  Filled 2020-04-29 (×2): qty 50

## 2020-04-29 MED ORDER — FENTANYL CITRATE (PF) 100 MCG/2ML IJ SOLN
50.0000 ug | INTRAMUSCULAR | Status: DC | PRN
  Administered 2020-04-29: 50 ug via INTRAVENOUS
  Filled 2020-04-29: qty 2

## 2020-04-29 MED ORDER — SACCHAROMYCES BOULARDII 250 MG PO CAPS
250.0000 mg | ORAL_CAPSULE | Freq: Two times a day (BID) | ORAL | Status: DC
  Administered 2020-04-29 – 2020-05-03 (×8): 250 mg via ORAL
  Filled 2020-04-29 (×8): qty 1

## 2020-04-29 MED ORDER — ENOXAPARIN SODIUM 40 MG/0.4ML ~~LOC~~ SOLN
40.0000 mg | SUBCUTANEOUS | Status: DC
  Administered 2020-04-29 – 2020-05-02 (×3): 40 mg via SUBCUTANEOUS
  Filled 2020-04-29 (×4): qty 0.4

## 2020-04-29 MED ORDER — PANTOPRAZOLE SODIUM 40 MG PO TBEC
40.0000 mg | DELAYED_RELEASE_TABLET | Freq: Two times a day (BID) | ORAL | Status: DC
  Administered 2020-04-29 – 2020-05-03 (×8): 40 mg via ORAL
  Filled 2020-04-29 (×8): qty 1

## 2020-04-29 MED ORDER — HYDRALAZINE HCL 20 MG/ML IJ SOLN: 10.0000 mg | Freq: Four times a day (QID) | INTRAMUSCULAR | Status: DC | PRN

## 2020-04-29 MED ORDER — SODIUM CHLORIDE 0.9 % IV BOLUS
1000.0000 mL | Freq: Once | INTRAVENOUS | Status: AC
  Administered 2020-04-29: 1000 mL via INTRAVENOUS

## 2020-04-29 MED ORDER — ACETAMINOPHEN 500 MG PO TABS
1000.0000 mg | ORAL_TABLET | ORAL | Status: AC
  Administered 2020-04-30: 1000 mg via ORAL
  Filled 2020-04-29: qty 2

## 2020-04-29 MED ORDER — PIPERACILLIN-TAZOBACTAM 3.375 G IVPB 30 MIN: 3.3750 g | Freq: Three times a day (TID) | INTRAVENOUS | Status: DC

## 2020-04-29 MED ORDER — ACETAMINOPHEN 325 MG PO TABS: 650.0000 mg | ORAL_TABLET | Freq: Four times a day (QID) | ORAL | Status: DC | PRN

## 2020-04-29 MED ORDER — HYDROMORPHONE HCL 1 MG/ML IJ SOLN
0.5000 mg | INTRAMUSCULAR | Status: DC | PRN
  Administered 2020-04-29: 1 mg via INTRAVENOUS
  Filled 2020-04-29: qty 1

## 2020-04-29 MED ORDER — AMLODIPINE BESYLATE 10 MG PO TABS
10.0000 mg | ORAL_TABLET | Freq: Every day | ORAL | Status: DC
  Administered 2020-04-29 – 2020-05-03 (×3): 10 mg via ORAL
  Filled 2020-04-29 (×4): qty 1

## 2020-04-29 MED ORDER — ONDANSETRON HCL 4 MG/2ML IJ SOLN
4.0000 mg | Freq: Once | INTRAMUSCULAR | Status: AC
  Administered 2020-04-29: 4 mg via INTRAVENOUS
  Filled 2020-04-29: qty 2

## 2020-04-29 MED ORDER — HYDROMORPHONE HCL 1 MG/ML IJ SOLN
1.0000 mg | INTRAMUSCULAR | Status: DC | PRN
  Administered 2020-04-29 – 2020-05-01 (×6): 1 mg via INTRAVENOUS
  Filled 2020-04-29 (×6): qty 1

## 2020-04-29 MED ORDER — METOPROLOL SUCCINATE ER 50 MG PO TB24
50.0000 mg | ORAL_TABLET | Freq: Every day | ORAL | Status: DC
  Administered 2020-04-29 – 2020-05-03 (×5): 50 mg via ORAL
  Filled 2020-04-29 (×5): qty 1

## 2020-04-29 NOTE — H&P (Signed)
History and Physical    Jordan Buchanan [id number] DOB: [birth year] DOA: 04/29/2020  PCP: Ria Bush, MD  Patient coming from: Home  I have personally briefly reviewed patient's old medical records in [place]  Chief Complaint: Abdominal pain  HPI: Jordan Buchanan is a 49 y.o. male with medical history significant of essential hypertension, obesity, GERD, migraine, nephrolithiasis, prior abdominal surgeries including umbilical hernia repair as well as recent hospitalization from 04/13/2020 through 04/19/2020 for acute diverticulitis with contained perforation where he received 7 days of IV antibiotics and was discharged on oral Augmentin for 7 days which he completed just 2 days ago.  According to patient, he was doing fine up until 10 PM last night when he started having abdominal pain.  Per him, his abdominal pain last time was in the lower abdomen but this time it was in the middle and upper abdomen.  It was 10 out of 10, nonradiating, crampy with no aggravating or relieving factor.  It was associated with chills, sweating and some nausea but no fever or vomiting or any diarrhea.  He went to [facility].  Currently he has no abdominal pain or any other symptoms.  ED Course: Upon arrival to ED, he was hemodynamically stable with slightly diastolic elevated blood pressure.  Repeat CT abdomen and pelvis was done which once again showed diverticulitis with contained perforation.  Had mild leukocytosis.  Received Zosyn over there and was transferred to [facility] for admission under hospitalist service.  Review of Systems: As per HPI otherwise negative.    Past Medical History:  Diagnosis Date  . Arrhythmia    PALPITATIONS AND PVC - improved with cutting down on caffeine  . Cough variant asthma 05/28/2014   05/27/2014 p extensive coaching HFA effectiveness =    75% > try dulera 100 2bid  - PFT's 07/03/2014 FEV1 4.15 (98%) ratio 89 and no chang p B2 and nl fef25-75  And nl dlco    .  Diverticulosis 07/2015   by CT scan  . GERD (gastroesophageal reflux disease)   . Hepatic steatosis   . Hypertension    h/o LVH, resolved  . Migraines   . Perforated diverticulum of large intestine 03/2020   hospitalization  . Prostatitis 05/09/2013  . Seasonal allergies   . 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 Estanislado Pandy)  . Tremor   . Urticaria     Past Surgical History:  Procedure Laterality Date  . ESOPHAGOGASTRODUODENOSCOPY  08/2018   esophagus dilated, biopsy showed chronic gastritis with mod reflux changes [facility])  . NASAL RECONSTRUCTION WITH SEPTAL REPAIR  2017   [place]  . OTHER SURGICAL HISTORY  1992   facial surgery  . ROTATOR CUFF REPAIR  2009   left  . SINOSCOPY    . UMBILICAL HERNIA REPAIR  05/10/2012   Procedure: HERNIA REPAIR UMBILICAL ADULT;  Surgeon: Harl Bowie, MD;  Location: [place];  Service: General;  Laterality: N/A;     reports that he quit smoking about 20 years ago. His smoking use included cigarettes. He has a 13.00 pack-year smoking history. He quit smokeless tobacco use about 6 years ago.  His smokeless tobacco use included snuff. He reports previous drug use. Drug: Marijuana. He reports that he does not drink alcohol.  Allergies  Allergen Reactions  . Hctz [Hydrochlorothiazide] Rash    Per derm rec stay off HCTZ (2018)    Family History  Problem Relation Age of Onset  .  Asthma Father   . Arthritis Father        father, brother, Pgrandfather  . Chronic bronchitis Father   . Allergies Father   . Hypertension Brother        obese  . CAD Paternal Grandfather        MI x2  . Multiple sclerosis Brother   . Stroke Neg Hx   . Cancer Neg Hx     Prior to Admission medications   Medication Sig Start Date End Date Taking? Authorizing Provider  albuterol (VENTOLIN HFA) 108 (90 Base) MCG/ACT inhaler Inhale 2 puffs into the lungs every 4 (four) hours as needed for wheezing or shortness of  breath. 03/30/17  Yes Parrett, Tammy S, NP  amLODipine (NORVASC) 10 MG tablet Take 1 tablet (10 mg total) by mouth daily. 03/14/20  Yes Ria Bush, MD  cetirizine (ZYRTEC) 10 MG tablet Take 10 mg by mouth daily.    Yes [provider]  metoprolol succinate (TOPROL-XL) 50 MG 24 hr tablet Take 1 tablet (50 mg total) by mouth daily. Take with or immediately following a meal. 03/14/20  Yes Ria Bush, MD  Multiple Vitamin (MULTIVITAMIN WITH MINERALS) TABS tablet Take 1 tablet by mouth daily.   Yes [provider]  omega-3 acid ethyl esters (LOVAZA) 1 g capsule Take 1 g by mouth daily.   Yes [provider]  pantoprazole (PROTONIX) 40 MG tablet Take 1 tablet by mouth 2 (two) times daily. 09/06/19 09/05/20 Yes [provider]  amoxicillin-clavulanate (AUGMENTIN) 875-125 MG tablet Take 1 tablet by mouth 2 (two) times daily. Patient not taking: Reported on 04/29/2020 04/19/20   Michael Boston, MD  diclofenac sodium (VOLTAREN) 1 % GEL Apply 2 g topically 4 (four) times daily. Patient not taking: Reported on 04/13/2020 12/25/18   Owens Loffler, MD  meloxicam (MOBIC) 15 MG tablet TAKE 1 TABLET BY MOUTH EVERY DAY Patient not taking: Reported on 04/13/2020 11/23/19   Edrick Kins, DPM    Physical Exam: Vitals:   04/29/20 0500 04/29/20 0735 04/29/20 0736 04/29/20 0934  BP: (!) 148/95 130/87    Pulse: 83 77  88  Resp:  18  15  Temp:   98 F (36.7 C) 98.1 F (36.7 C)  TempSrc:   Oral   SpO2: 94% 97%  97%  Weight:      Height:        Constitutional: NAD, calm, comfortable Vitals:   04/29/20 0500 04/29/20 0735 04/29/20 0736 04/29/20 0934  BP: (!) 148/95 130/87    Pulse: 83 77  88  Resp:  18  15  Temp:   98 F (36.7 C) 98.1 F (36.7 C)  TempSrc:   Oral   SpO2: 94% 97%  97%  Weight:      Height:       Eyes: PERRL, lids and conjunctivae normal ENMT: Mucous membranes are moist. Posterior pharynx clear of any exudate or lesions.Normal dentition.  Neck:  normal, supple, no masses, no thyromegaly Respiratory: clear to auscultation bilaterally, no wheezing, no crackles. Normal respiratory effort. No accessory muscle use.  Cardiovascular: Regular rate and rhythm, no murmurs / rubs / gallops. No extremity edema. 2+ pedal pulses. No carotid bruits.  Abdomen: no tenderness, no masses palpated. No hepatosplenomegaly. Bowel sounds positive.  Musculoskeletal: no clubbing / cyanosis. No joint deformity upper and lower extremities. Good ROM, no contractures. Normal muscle tone.  Skin: no rashes, lesions, ulcers. No induration Neurologic: CN 2-12 grossly intact. Sensation intact, DTR normal. Strength  5/5 in all 4.  Psychiatric: Normal judgment and insight. Alert and oriented x 3. Normal mood.    Labs on Admission: I have personally reviewed following labs and imaging studies  CBC: Recent Labs  Lab 04/28/20 2335  WBC 10.8*  HGB 15.7  HCT 44.9  MCV 88.0  PLT 268*   Basic Metabolic Panel: Recent Labs  Lab 04/28/20 2335  NA 143  K 4.0  CL 103  CO2 28  GLUCOSE 119*  BUN 9  CREATININE 1.02  CALCIUM 9.6   GFR: Estimated Creatinine Clearance: 109.4 mL/min (by C-G formula based on SCr of 1.02 mg/dL). Liver Function Tests: Recent Labs  Lab 04/28/20 2335  AST 23  ALT 29  ALKPHOS 66  BILITOT 0.7  PROT 7.7  ALBUMIN 4.1   Recent Labs  Lab 04/28/20 2335  LIPASE 45   No results for input(s): AMMONIA in the last 168 hours. Coagulation Profile: No results for input(s): INR, PROTIME in the last 168 hours. Cardiac Enzymes: No results for input(s): CKTOTAL, CKMB, CKMBINDEX, TROPONINI in the last 168 hours. BNP (last 3 results) No results for input(s): PROBNP in the last 8760 hours. HbA1C: No results for input(s): HGBA1C in the last 72 hours. CBG: No results for input(s): GLUCAP in the last 168 hours. Lipid Profile: No results for input(s): CHOL, HDL, LDLCALC, TRIG, CHOLHDL, LDLDIRECT in the last 72 hours. Thyroid Function Tests: No  results for input(s): TSH, T4TOTAL, FREET4, T3FREE, THYROIDAB in the last 72 hours. Anemia Panel: No results for input(s): VITAMINB12, FOLATE, FERRITIN, TIBC, IRON, RETICCTPCT in the last 72 hours. Urine analysis:    Component Value Date/Time   COLORURINE YELLOW 04/29/2020 0100   APPEARANCEUR CLEAR 04/29/2020 0100   LABSPEC 1.020 04/29/2020 0100   PHURINE 6.0 04/29/2020 0100   GLUCOSEU NEGATIVE 04/29/2020 0100   HGBUR NEGATIVE 04/29/2020 0100   BILIRUBINUR NEGATIVE 04/29/2020 0100   BILIRUBINUR negative 01/31/2018 1548   KETONESUR NEGATIVE 04/29/2020 0100   PROTEINUR NEGATIVE 04/29/2020 0100   UROBILINOGEN 0.2 01/31/2018 1548   UROBILINOGEN 0.2 09/13/2013 2134   NITRITE NEGATIVE 04/29/2020 0100   LEUKOCYTESUR NEGATIVE 04/29/2020 0100    Radiological Exams on Admission: CT ABDOMEN PELVIS W CONTRAST  Result Date: 04/29/2020 CLINICAL DATA:  Abdominal pain, diverticulitis EXAM: CT ABDOMEN AND PELVIS WITH CONTRAST TECHNIQUE: Multidetector CT imaging of the abdomen and pelvis was performed using the standard protocol following bolus administration of intravenous contrast. CONTRAST:  163m OMNIPAQUE IOHEXOL 300 MG/ML  SOLN COMPARISON:  04/13/2020 FINDINGS: Lower chest: No acute pleural or parenchymal lung disease Hepatobiliary: Diffuse hepatic steatosis. No focal abnormality. The gallbladder is unremarkable. Pancreas: Unremarkable. No pancreatic ductal dilatation or surrounding inflammatory changes. Spleen: Normal in size without focal abnormality. Adrenals/Urinary Tract: Adrenal glands are unremarkable. Kidneys are normal, without renal calculi, focal lesion, or hydronephrosis. Bladder is unremarkable. Stomach/Bowel: Wall thickening and pericolonic fat stranding within the sigmoid colon again noted unchanged. There is a small gas/fluid collection in the left lower quadrant measuring 3.0 x 2.7 cm consistent with focal contained perforation. No evidence of peripheral enhancement or abscess at this  time. No bowel obstruction or ileus. Normal appendix right lower quadrant. Vascular/Lymphatic: Aortic atherosclerosis. No enlarged abdominal or pelvic lymph nodes. Reproductive: Prostate is unremarkable. Other: No abdominal wall hernia or abnormality. No abdominopelvic ascites. Musculoskeletal: No acute or destructive bony lesions. Reconstructed images demonstrate no additional findings. IMPRESSION: 1. Continued findings of acute sigmoid diverticulitis. There is continued focal contained perforation, with small gas/fluid collection as above. No evidence of  well-formed abscess at this time. 2. Hepatic steatosis. 3.  Aortic Atherosclerosis (ICD10-I70.0). Electronically Signed   By: Randa Ngo M.D.   On: 04/29/2020 02:23    EKG: Independently reviewed.  Normal sinus rhythm with no acute ST-T wave changes  Assessment/Plan Active Problems:   Hypertension   GERD (gastroesophageal reflux disease)   Acute diverticulitis    Recurrent acute diverticulitis with contained perforation: We will continue Zosyn.  As needed Dilaudid.  Consulted general surgery for their recommendations.  Essential hypertension: Blood pressure much better now.  Resume home medications and monitor.  As needed hydralazine.  GERD: Resume PPI.  DVT prophylaxis: Lovenox Code Status: Full code Family Communication: Wife present at bedside.  Plan of care discussed with patient in length and he verbalized understanding and agreed with it. Disposition Plan: Likely home in next 2 to 3 days Consults called: General surgery Admission status: Inpatient   Status is: Inpatient  Remains inpatient appropriate because:IV treatments appropriate due to intensity of illness or inability to take PO   Dispo: The patient is from: Home              Anticipated d/c is to: Home              Anticipated d/c date is: 2 days              Patient currently is not medically stable to d/c.        Darliss Cheney MD [facility]  04/29/2020, 11:14 AM  To contact the attending provider between 7A-7P or the covering provider during after hours 7P-7A, please log into the web site [email or web address]

## 2020-04-29 NOTE — Plan of Care (Signed)
Plan of care reviewed and discussed with the patient. 

## 2020-04-29 NOTE — H&P (View-Only) (Signed)
[facility] Surgery Consult Note  Jordan Buchanan Jun 18, 1971  [id number].    Requesting MD: Darliss Cheney Chief Complaint/Reason for Consult: diverticulitis   HPI:  Jordan Buchanan is a 49yo male who presented to [facility] last night with recurrent abdominal pain. Patient was admitted 04/13/20 through 04/19/20 with perforated sigmoid diverticulitis. He was treated with 7 days of zosyn and transitioned to oral augmentin x7 days at discharge. States that his symptoms were improving but not fully resolved prior to completing Augmentin on Sunday 5/9. Abdominal pain had resolved but his appetite was not quite back to normal and he was still experiencing early satiety. States that last night he was lying in bed when he experienced burning epigastric pain. Associated with nausea and chills. Denies fever, emesis, diarrhea, or dysuria. Pain was constant and severe, so he came to the ED. Patient has never had a colonoscopy, he was scheduled to see GI in [place] at [facility] today to schedule one.  In the ED he underwent CT scan which showed acute sigmoid diverticulitis with continued focal contained perforation and small gas/fluid collection 3x2.7cm, no evidence of well-formed abscess. WBC 10.8. Patient was admitted to the medical service and started on IV zosyn. Due to the location of his pain and abdominal u/s was ordered and is pending. General surgery asked to see. States that since admission his pain has improved. He reports very mild epigastric and LLQ abdominal pain. Denies n/v.   Abdominal surgical history: umbilical hernia repair 9518 Dr. 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 Negative for fever.  HENT: Negative.   Eyes: Negative.   Respiratory: Negative.   Cardiovascular: Negative.   Gastrointestinal: Positive for abdominal pain, heartburn and nausea. Negative  for constipation, diarrhea and vomiting.  Genitourinary: Negative.   Musculoskeletal: Negative.   Skin: Negative.   Neurological: Negative.    All systems reviewed and otherwise negative except for as above  Family History  Problem Relation Age of Onset  . Asthma Father   . Arthritis Father        father, brother, Pgrandfather  . Chronic bronchitis Father   . Allergies Father   . Hypertension Brother        obese  . CAD Paternal Grandfather        MI x2  . Multiple sclerosis Brother   . Stroke Neg Hx   . Cancer Neg Hx     Past Medical History:  Diagnosis Date  . Arrhythmia    PALPITATIONS AND PVC - improved with cutting down on caffeine  . Cough variant asthma 05/28/2014   05/27/2014 p extensive coaching HFA effectiveness =    75% > try dulera 100 2bid  - PFT's 07/03/2014 FEV1 4.15 (98%) ratio 89 and no chang p B2 and nl fef25-75  And nl dlco    . Diverticulosis 07/2015   by CT scan  . GERD (gastroesophageal reflux disease)   . Hepatic steatosis   . Hypertension    h/o LVH, resolved  . Migraines   . Perforated diverticulum of large intestine 03/2020   hospitalization  . Prostatitis 05/09/2013  . Seasonal allergies   . 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 Estanislado Pandy)  . Tremor   . Urticaria     Past Surgical History:  Procedure Laterality Date  . ESOPHAGOGASTRODUODENOSCOPY  08/2018   esophagus dilated, biopsy showed chronic gastritis with  mod reflux changes [facility])  . NASAL RECONSTRUCTION WITH SEPTAL REPAIR  2017   [place]  . OTHER SURGICAL HISTORY  1992   facial surgery  . ROTATOR CUFF REPAIR  2009   left  . SINOSCOPY    . UMBILICAL HERNIA REPAIR  05/10/2012   Procedure: HERNIA REPAIR UMBILICAL ADULT;  Surgeon: Harl Bowie, MD;  Location: [place];  Service: General;  Laterality: N/A;    Social History:  reports that he quit smoking about 20 years ago. His smoking use included cigarettes. He has a 13.00  pack-year smoking history. He quit smokeless tobacco use about 6 years ago.  His smokeless tobacco use included snuff. He reports previous drug use. Drug: Marijuana. He reports that he does not drink alcohol.  Allergies:  Allergies  Allergen Reactions  . Hctz [Hydrochlorothiazide] Rash    Per derm rec stay off HCTZ (2018)    Medications Prior to Admission  Medication Sig Dispense Refill  . albuterol (VENTOLIN HFA) 108 (90 Base) MCG/ACT inhaler Inhale 2 puffs into the lungs every 4 (four) hours as needed for wheezing or shortness of breath. 1 Inhaler 5  . amLODipine (NORVASC) 10 MG tablet Take 1 tablet (10 mg total) by mouth daily. 90 tablet 3  . cetirizine (ZYRTEC) 10 MG tablet Take 10 mg by mouth daily.     . metoprolol succinate (TOPROL-XL) 50 MG 24 hr tablet Take 1 tablet (50 mg total) by mouth daily. Take with or immediately following a meal. 90 tablet 3  . Multiple Vitamin (MULTIVITAMIN WITH MINERALS) TABS tablet Take 1 tablet by mouth daily.    Marland Kitchen omega-3 acid ethyl esters (LOVAZA) 1 g capsule Take 1 g by mouth daily.    . pantoprazole (PROTONIX) 40 MG tablet Take 1 tablet by mouth 2 (two) times daily.    Marland Kitchen amoxicillin-clavulanate (AUGMENTIN) 875-125 MG tablet Take 1 tablet by mouth 2 (two) times daily. (Patient not taking: Reported on 04/29/2020) 14 tablet 1  . diclofenac sodium (VOLTAREN) 1 % GEL Apply 2 g topically 4 (four) times daily. (Patient not taking: Reported on 04/13/2020) 3 Tube 5  . meloxicam (MOBIC) 15 MG tablet TAKE 1 TABLET BY MOUTH EVERY DAY (Patient not taking: Reported on 04/13/2020) 30 tablet 0    Prior to Admission medications   Medication Sig Start Date End Date Taking? Authorizing Provider  albuterol (VENTOLIN HFA) 108 (90 Base) MCG/ACT inhaler Inhale 2 puffs into the lungs every 4 (four) hours as needed for wheezing or shortness of breath. 03/30/17  Yes Parrett, Tammy S, NP  amLODipine (NORVASC) 10 MG tablet Take 1 tablet (10 mg total) by mouth daily. 03/14/20  Yes  Ria Bush, MD  cetirizine (ZYRTEC) 10 MG tablet Take 10 mg by mouth daily.    Yes [provider]  metoprolol succinate (TOPROL-XL) 50 MG 24 hr tablet Take 1 tablet (50 mg total) by mouth daily. Take with or immediately following a meal. 03/14/20  Yes Ria Bush, MD  Multiple Vitamin (MULTIVITAMIN WITH MINERALS) TABS tablet Take 1 tablet by mouth daily.   Yes [provider]  omega-3 acid ethyl esters (LOVAZA) 1 g capsule Take 1 g by mouth daily.   Yes [provider]  pantoprazole (PROTONIX) 40 MG tablet Take 1 tablet by mouth 2 (two) times daily. 09/06/19 09/05/20 Yes [provider]  amoxicillin-clavulanate (AUGMENTIN) 875-125 MG tablet Take 1 tablet by mouth 2 (two) times daily. Patient not taking: Reported on 04/29/2020 04/19/20   Michael Boston,  MD  diclofenac sodium (VOLTAREN) 1 % GEL Apply 2 g topically 4 (four) times daily. Patient not taking: Reported on 04/13/2020 12/25/18   Owens Loffler, MD  meloxicam (MOBIC) 15 MG tablet TAKE 1 TABLET BY MOUTH EVERY DAY Patient not taking: Reported on 04/13/2020 11/23/19   Edrick Kins, DPM    Blood pressure 130/87, pulse 88, temperature 98.1 F (36.7 C), resp. rate 15, height 5' 10"  (1.778 m), weight 104.3 kg, SpO2 97 %. Physical Exam: General: pleasant, WD/WN white male who is laying in bed in NAD HEENT: head is normocephalic, atraumatic.  Sclera are noninjected.  PERRL.  Ears and nose without any masses or lesions.  Mouth is pink and moist. Dentition fair Heart: regular, rate, and rhythm.  Normal s1,s2. No obvious murmurs, gallops, or rubs noted.  Palpable pedal pulses bilaterally  Lungs: CTAB, no wheezes, rhonchi, or rales noted.  Respiratory effort nonlabored Abd: soft, ND, +BS, no masses, hernias, or organomegaly. Mild subjective LLQ and epigastric TTP without rebound or guarding MS: no BUE/BLE edema, calves soft and nontender Skin: warm and dry with no masses, lesions, or rashes Psych: A&Ox4  with an appropriate affect Neuro: cranial nerves grossly intact, equal strength in BUE/BLE bilaterally, normal speech, thought process intact  Results for orders placed or performed during the hospital encounter of 04/29/20 (from the past 48 hour(s))  Urinalysis, Routine w reflex microscopic     Status: None   Collection Time: 04/29/20  1:00 AM  Result Value Ref Range   Color, Urine YELLOW YELLOW   APPearance CLEAR CLEAR   Specific Gravity, Urine 1.020 1.005 - 1.030   pH 6.0 5.0 - 8.0   Glucose, UA NEGATIVE NEGATIVE mg/dL   Hgb urine dipstick NEGATIVE NEGATIVE   Bilirubin Urine NEGATIVE NEGATIVE   Ketones, ur NEGATIVE NEGATIVE mg/dL   Protein, ur NEGATIVE NEGATIVE mg/dL   Nitrite NEGATIVE NEGATIVE   Leukocytes,Ua NEGATIVE NEGATIVE    Comment: Microscopic not done on urines with negative protein, blood, leukocytes, nitrite, or glucose < 500 mg/dL. Performed at [facility], [address] [place], [place] [address]   Troponin I (High Sensitivity)     Status: None   Collection Time: 04/29/20  1:46 AM  Result Value Ref Range   Troponin I (High Sensitivity) 3 <18 ng/L    Comment: (NOTE) Elevated high sensitivity troponin I (hsTnI) values and significant  changes across serial measurements may suggest ACS but many other  chronic and acute conditions are known to elevate hsTnI results.  Refer to the Links section for chest pain algorithms and additional  guidance. Performed at [facility], [address] [place], [place] [address]   D-dimer, quantitative (not at [facility])     Status: None   Collection Time: 04/29/20  1:46 AM  Result Value Ref Range   D-Dimer, Quant 0.48 0.00 - 0.50 ug/mL-FEU    Comment: (NOTE) At the manufacturer cut-off of 0.50 ug/mL FEU, this assay has been documented to exclude PE with a sensitivity and negative predictive value of 97 to 99%.  At this time, this assay has not been approved by the FDA to exclude DVT/VTE. Results should be  correlated with clinical presentation. Performed at [facility], [address] [place], [place] [address]   Lactic acid, plasma     Status: None   Collection Time: 04/29/20  2:36 AM  Result Value Ref Range   Lactic Acid, Venous 1.3 0.5 - 1.9 mmol/L  Comment: Performed at [facility], [address] [place], [place] [address]  SARS Coronavirus 2 by RT PCR (hospital order, performed in [facility] hospital lab) Nasopharyngeal Nasopharyngeal Swab     Status: None   Collection Time: 04/29/20  4:09 AM   Specimen: Nasopharyngeal Swab  Result Value Ref Range   SARS Coronavirus 2 NEGATIVE NEGATIVE    Comment: (NOTE) SARS-CoV-2 target nucleic acids are NOT DETECTED. The SARS-CoV-2 RNA is generally detectable in upper and lower respiratory specimens during the acute phase of infection. The lowest concentration of SARS-CoV-2 viral copies this assay can detect is 250 copies / mL. A negative result does not preclude SARS-CoV-2 infection and should not be used as the sole basis for treatment or other patient management decisions.  A negative result may occur with improper specimen collection / handling, submission of specimen other than nasopharyngeal swab, presence of viral mutation(s) within the areas targeted by this assay, and inadequate number of viral copies (<250 copies / mL). A negative result must be combined with clinical observations, patient history, and epidemiological information. Fact Sheet for Patients:   StrictlyIdeas.no Fact Sheet for Healthcare Providers: BankingDealers.co.za This test is not yet approved or cleared  by the [place] FDA and has been authorized for detection and/or diagnosis of SARS-CoV-2 by FDA under an Emergency Use Authorization (EUA).  This EUA will remain in effect (meaning this test can be used) for the duration of the COVID-19 declaration under Section 564(b)(1) of the Act,  21 U.S.C. section 360bbb-3(b)(1), unless the authorization is terminated or revoked sooner. Performed at [facility], [address] [place], [place] [address]    CT ABDOMEN PELVIS W CONTRAST  Result Date: 04/29/2020 CLINICAL DATA:  Abdominal pain, diverticulitis EXAM: CT ABDOMEN AND PELVIS WITH CONTRAST TECHNIQUE: Multidetector CT imaging of the abdomen and pelvis was performed using the standard protocol following bolus administration of intravenous contrast. CONTRAST:  11m OMNIPAQUE IOHEXOL 300 MG/ML  SOLN COMPARISON:  04/13/2020 FINDINGS: Lower chest: No acute pleural or parenchymal lung disease Hepatobiliary: Diffuse hepatic steatosis. No focal abnormality. The gallbladder is unremarkable. Pancreas: Unremarkable. No pancreatic ductal dilatation or surrounding inflammatory changes. Spleen: Normal in size without focal abnormality. Adrenals/Urinary Tract: Adrenal glands are unremarkable. Kidneys are normal, without renal calculi, focal lesion, or hydronephrosis. Bladder is unremarkable. Stomach/Bowel: Wall thickening and pericolonic fat stranding within the sigmoid colon again noted unchanged. There is a small gas/fluid collection in the left lower quadrant measuring 3.0 x 2.7 cm consistent with focal contained perforation. No evidence of peripheral enhancement or abscess at this time. No bowel obstruction or ileus. Normal appendix right lower quadrant. Vascular/Lymphatic: Aortic atherosclerosis. No enlarged abdominal or pelvic lymph nodes. Reproductive: Prostate is unremarkable. Other: No abdominal wall hernia or abnormality. No abdominopelvic ascites. Musculoskeletal: No acute or destructive bony lesions. Reconstructed images demonstrate no additional findings. IMPRESSION: 1. Continued findings of acute sigmoid diverticulitis. There is continued focal contained perforation, with small gas/fluid collection as above. No evidence of well-formed abscess at this time. 2. Hepatic steatosis. 3.   Aortic Atherosclerosis (ICD10-I70.0). Electronically Signed   By: MRanda NgoM.D.   On: 04/29/2020 02:23    Anti-infectives (From admission, onward)   Start     Dose/Rate Route Frequency Ordered Stop   04/29/20 1400  piperacillin-tazobactam (ZOSYN) IVPB 3.375 g  Status:  Discontinued     3.375 g 100 mL/hr over 30 Minutes Intravenous Every 8 hours 04/29/20 1118 04/29/20 1124   04/29/20 1000  piperacillin-tazobactam (ZOSYN) IVPB 3.375 g     3.375 g 12.5 mL/hr over 240 Minutes Intravenous Every 8 hours 04/29/20 0430     04/29/20 0330  piperacillin-tazobactam (ZOSYN) IVPB 3.375 g     3.375 g 100 mL/hr over 30 Minutes Intravenous  Once 04/29/20 0327 04/29/20 0423       Assessment/Plan HTN GERD Nephrolithiasis OSA  Obesity BMI 33  Acute sigmoid diverticulitis with perforation Acute cholecystitis - admitted 04/13/20 through 04/19/20 with perforated sigmoid diverticulitis, treated with 7 days of zosyn and 7 days augmentin which he completed 5/9 - readmitted 5/10 with worsening abdominal pain, although pain now more epigastric - u/s shows mild gallbladder wall thickening, pericholecystic fluid and gallbladder sludge, cannot rule out acute acalculous cholecystitis. LFTs and lipase WNL - CT scan fairly similar with little to no improvement in diverticulitis with continued focal contained perforation and small gas/fluid collection 3x2.7cm, no evidence of well-formed abscess  - Discussed treatment options including continued trial of nonoperative management with antibiotics and monitoring; if pain and leukocytosis resolve and patient is able to tolerate diet advancement we could discharge home with a longer course of antibiotics and plan for GI and surgical follow up as previously set out. With his possible cholecystitis, the other option would be laparoscopic cholecystectomy with wash out and drain placement for diverticulitis. Patient agrees to proceed with surgery. Will plan for laparoscopic  cholecystectomy, diagnostic laparoscopy with wash out and drain placement tomorrow. NPO after midnight.  ID - zosyn 5/11>> VTE - SCDs, lovenox FEN - IVF, CLD Foley - none  Wellington Hampshire, PA-C [facility] 04/29/2020, 11:32 AM Please see Amion for pager number during day hours 7:00am-4:30pm

## 2020-04-29 NOTE — ED Notes (Signed)
Up to BR, gait steady

## 2020-04-29 NOTE — ED Triage Notes (Addendum)
Pt c/o epigastric abd pain and nausea  x 2 hrs ago , at [facility] ED  earlier [facility]  Labs drawn , recent d/c from hospital admission x 3 days ago  DX diverticulitis

## 2020-04-29 NOTE — ED Notes (Signed)
[facility] notified (Tammy) - patient ready for transport 

## 2020-04-29 NOTE — ED Notes (Signed)
Report called to Laruth Bouchard, receiving nurse at [facility]. Belongings sent with wife

## 2020-04-29 NOTE — Plan of Care (Signed)

## 2020-04-29 NOTE — Consult Note (Addendum)
[facility] Surgery Consult Note  Jordan Buchanan [birth year]  [id number].    Requesting MD: Darliss Cheney Chief Complaint/Reason for Consult: diverticulitis   HPI:  Jordan Buchanan is a 49yo male who presented to [facility] last night with recurrent abdominal pain. Patient was admitted 04/13/20 through 04/19/20 with perforated sigmoid diverticulitis. He was treated with 7 days of zosyn and transitioned to oral augmentin x7 days at discharge. States that his symptoms were improving but not fully resolved prior to completing Augmentin on Sunday 5/9. Abdominal pain had resolved but his appetite was not quite back to normal and he was still experiencing early satiety. States that last night he was lying in bed when he experienced burning epigastric pain. Associated with nausea and chills. Denies fever, emesis, diarrhea, or dysuria. Pain was constant and severe, so he came to the ED. Patient has never had a colonoscopy, he was scheduled to see GI in [place] at [facility] today to schedule one.  In the ED he underwent CT scan which showed acute sigmoid diverticulitis with continued focal contained perforation and small gas/fluid collection 3x2.7cm, no evidence of well-formed abscess. WBC 10.8. Patient was admitted to the medical service and started on IV zosyn. Due to the location of his pain and abdominal u/s was ordered and is pending. General surgery asked to see. States that since admission his pain has improved. He reports very mild epigastric and LLQ abdominal pain. Denies n/v.   Abdominal surgical history: umbilical hernia repair 5916 Dr. 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 Negative for fever.  HENT: Negative.   Eyes: Negative.   Respiratory: Negative.   Cardiovascular: Negative.   Gastrointestinal: Positive for abdominal pain, heartburn and nausea. Negative  for constipation, diarrhea and vomiting.  Genitourinary: Negative.   Musculoskeletal: Negative.   Skin: Negative.   Neurological: Negative.    All systems reviewed and otherwise negative except for as above  Family History  Problem Relation Age of Onset  . Asthma Father   . Arthritis Father        father, brother, Pgrandfather  . Chronic bronchitis Father   . Allergies Father   . Hypertension Brother        obese  . CAD Paternal Grandfather        MI x2  . Multiple sclerosis Brother   . Stroke Neg Hx   . Cancer Neg Hx     Past Medical History:  Diagnosis Date  . Arrhythmia    PALPITATIONS AND PVC - improved with cutting down on caffeine  . Cough variant asthma 05/28/2014   05/27/2014 p extensive coaching HFA effectiveness =    75% > try dulera 100 2bid  - PFT's 07/03/2014 FEV1 4.15 (98%) ratio 89 and no chang p B2 and nl fef25-75  And nl dlco    . Diverticulosis 07/2015   by CT scan  . GERD (gastroesophageal reflux disease)   . Hepatic steatosis   . Hypertension    h/o LVH, resolved  . Migraines   . Perforated diverticulum of large intestine 03/2020   hospitalization  . Prostatitis 05/09/2013  . Seasonal allergies   . 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 Estanislado Pandy)  . Tremor   . Urticaria     Past Surgical History:  Procedure Laterality Date  . ESOPHAGOGASTRODUODENOSCOPY  08/2018   esophagus dilated, biopsy showed chronic gastritis with  mod reflux changes [facility])  . NASAL RECONSTRUCTION WITH SEPTAL REPAIR  2017   [place]  . OTHER SURGICAL HISTORY  1992   facial surgery  . ROTATOR CUFF REPAIR  2009   left  . SINOSCOPY    . UMBILICAL HERNIA REPAIR  05/10/2012   Procedure: HERNIA REPAIR UMBILICAL ADULT;  Surgeon: Harl Bowie, MD;  Location: [place];  Service: General;  Laterality: N/A;    Social History:  reports that he quit smoking about 20 years ago. His smoking use included cigarettes. He has a 13.00  pack-year smoking history. He quit smokeless tobacco use about 6 years ago.  His smokeless tobacco use included snuff. He reports previous drug use. Drug: Marijuana. He reports that he does not drink alcohol.  Allergies:  Allergies  Allergen Reactions  . Hctz [Hydrochlorothiazide] Rash    Per derm rec stay off HCTZ (2018)    Medications Prior to Admission  Medication Sig Dispense Refill  . albuterol (VENTOLIN HFA) 108 (90 Base) MCG/ACT inhaler Inhale 2 puffs into the lungs every 4 (four) hours as needed for wheezing or shortness of breath. 1 Inhaler 5  . amLODipine (NORVASC) 10 MG tablet Take 1 tablet (10 mg total) by mouth daily. 90 tablet 3  . cetirizine (ZYRTEC) 10 MG tablet Take 10 mg by mouth daily.     . metoprolol succinate (TOPROL-XL) 50 MG 24 hr tablet Take 1 tablet (50 mg total) by mouth daily. Take with or immediately following a meal. 90 tablet 3  . Multiple Vitamin (MULTIVITAMIN WITH MINERALS) TABS tablet Take 1 tablet by mouth daily.    Marland Kitchen omega-3 acid ethyl esters (LOVAZA) 1 g capsule Take 1 g by mouth daily.    . pantoprazole (PROTONIX) 40 MG tablet Take 1 tablet by mouth 2 (two) times daily.    Marland Kitchen amoxicillin-clavulanate (AUGMENTIN) 875-125 MG tablet Take 1 tablet by mouth 2 (two) times daily. (Patient not taking: Reported on 04/29/2020) 14 tablet 1  . diclofenac sodium (VOLTAREN) 1 % GEL Apply 2 g topically 4 (four) times daily. (Patient not taking: Reported on 04/13/2020) 3 Tube 5  . meloxicam (MOBIC) 15 MG tablet TAKE 1 TABLET BY MOUTH EVERY DAY (Patient not taking: Reported on 04/13/2020) 30 tablet 0    Prior to Admission medications   Medication Sig Start Date End Date Taking? Authorizing Provider  albuterol (VENTOLIN HFA) 108 (90 Base) MCG/ACT inhaler Inhale 2 puffs into the lungs every 4 (four) hours as needed for wheezing or shortness of breath. 03/30/17  Yes Parrett, Tammy S, NP  amLODipine (NORVASC) 10 MG tablet Take 1 tablet (10 mg total) by mouth daily. 03/14/20  Yes  Ria Bush, MD  cetirizine (ZYRTEC) 10 MG tablet Take 10 mg by mouth daily.    Yes [provider]  metoprolol succinate (TOPROL-XL) 50 MG 24 hr tablet Take 1 tablet (50 mg total) by mouth daily. Take with or immediately following a meal. 03/14/20  Yes Ria Bush, MD  Multiple Vitamin (MULTIVITAMIN WITH MINERALS) TABS tablet Take 1 tablet by mouth daily.   Yes [provider]  omega-3 acid ethyl esters (LOVAZA) 1 g capsule Take 1 g by mouth daily.   Yes [provider]  pantoprazole (PROTONIX) 40 MG tablet Take 1 tablet by mouth 2 (two) times daily. 09/06/19 09/05/20 Yes [provider]  amoxicillin-clavulanate (AUGMENTIN) 875-125 MG tablet Take 1 tablet by mouth 2 (two) times daily. Patient not taking: Reported on 04/29/2020 04/19/20   Michael Boston,  MD  diclofenac sodium (VOLTAREN) 1 % GEL Apply 2 g topically 4 (four) times daily. Patient not taking: Reported on 04/13/2020 12/25/18   Owens Loffler, MD  meloxicam (MOBIC) 15 MG tablet TAKE 1 TABLET BY MOUTH EVERY DAY Patient not taking: Reported on 04/13/2020 11/23/19   Edrick Kins, DPM    Blood pressure 130/87, pulse 88, temperature 98.1 F (36.7 C), resp. rate 15, height 5' 10"  (1.778 m), weight 104.3 kg, SpO2 97 %. Physical Exam: General: pleasant, WD/WN white male who is laying in bed in NAD HEENT: head is normocephalic, atraumatic.  Sclera are noninjected.  PERRL.  Ears and nose without any masses or lesions.  Mouth is pink and moist. Dentition fair Heart: regular, rate, and rhythm.  Normal s1,s2. No obvious murmurs, gallops, or rubs noted.  Palpable pedal pulses bilaterally  Lungs: CTAB, no wheezes, rhonchi, or rales noted.  Respiratory effort nonlabored Abd: soft, ND, +BS, no masses, hernias, or organomegaly. Mild subjective LLQ and epigastric TTP without rebound or guarding MS: no BUE/BLE edema, calves soft and nontender Skin: warm and dry with no masses, lesions, or rashes Psych: A&Ox4  with an appropriate affect Neuro: cranial nerves grossly intact, equal strength in BUE/BLE bilaterally, normal speech, thought process intact  Results for orders placed or performed during the hospital encounter of 04/29/20 (from the past 48 hour(s))  Urinalysis, Routine w reflex microscopic     Status: None   Collection Time: 04/29/20  1:00 AM  Result Value Ref Range   Color, Urine YELLOW YELLOW   APPearance CLEAR CLEAR   Specific Gravity, Urine 1.020 1.005 - 1.030   pH 6.0 5.0 - 8.0   Glucose, UA NEGATIVE NEGATIVE mg/dL   Hgb urine dipstick NEGATIVE NEGATIVE   Bilirubin Urine NEGATIVE NEGATIVE   Ketones, ur NEGATIVE NEGATIVE mg/dL   Protein, ur NEGATIVE NEGATIVE mg/dL   Nitrite NEGATIVE NEGATIVE   Leukocytes,Ua NEGATIVE NEGATIVE    Comment: Microscopic not done on urines with negative protein, blood, leukocytes, nitrite, or glucose < 500 mg/dL. Performed at [facility], [address] [place], [place] [address]   Troponin I (High Sensitivity)     Status: None   Collection Time: 04/29/20  1:46 AM  Result Value Ref Range   Troponin I (High Sensitivity) 3 <18 ng/L    Comment: (NOTE) Elevated high sensitivity troponin I (hsTnI) values and significant  changes across serial measurements may suggest ACS but many other  chronic and acute conditions are known to elevate hsTnI results.  Refer to the Links section for chest pain algorithms and additional  guidance. Performed at [facility], [address] [place], [place] [address]   D-dimer, quantitative (not at [facility])     Status: None   Collection Time: 04/29/20  1:46 AM  Result Value Ref Range   D-Dimer, Quant 0.48 0.00 - 0.50 ug/mL-FEU    Comment: (NOTE) At the manufacturer cut-off of 0.50 ug/mL FEU, this assay has been documented to exclude PE with a sensitivity and negative predictive value of 97 to 99%.  At this time, this assay has not been approved by the FDA to exclude DVT/VTE. Results should be  correlated with clinical presentation. Performed at [facility], [address] [place], [place] [address]   Lactic acid, plasma     Status: None   Collection Time: 04/29/20  2:36 AM  Result Value Ref Range   Lactic Acid, Venous 1.3 0.5 - 1.9 mmol/L  Comment: Performed at [facility], [address] [place], [place] [address]  SARS Coronavirus 2 by RT PCR (hospital order, performed in [facility] hospital lab) Nasopharyngeal Nasopharyngeal Swab     Status: None   Collection Time: 04/29/20  4:09 AM   Specimen: Nasopharyngeal Swab  Result Value Ref Range   SARS Coronavirus 2 NEGATIVE NEGATIVE    Comment: (NOTE) SARS-CoV-2 target nucleic acids are NOT DETECTED. The SARS-CoV-2 RNA is generally detectable in upper and lower respiratory specimens during the acute phase of infection. The lowest concentration of SARS-CoV-2 viral copies this assay can detect is 250 copies / mL. A negative result does not preclude SARS-CoV-2 infection and should not be used as the sole basis for treatment or other patient management decisions.  A negative result may occur with improper specimen collection / handling, submission of specimen other than nasopharyngeal swab, presence of viral mutation(s) within the areas targeted by this assay, and inadequate number of viral copies (<250 copies / mL). A negative result must be combined with clinical observations, patient history, and epidemiological information. Fact Sheet for Patients:   StrictlyIdeas.no Fact Sheet for Healthcare Providers: BankingDealers.co.za This test is not yet approved or cleared  by the [place] FDA and has been authorized for detection and/or diagnosis of SARS-CoV-2 by FDA under an Emergency Use Authorization (EUA).  This EUA will remain in effect (meaning this test can be used) for the duration of the COVID-19 declaration under Section 564(b)(1) of the Act,  21 U.S.C. section 360bbb-3(b)(1), unless the authorization is terminated or revoked sooner. Performed at [facility], [address] [place], [place] [address]    CT ABDOMEN PELVIS W CONTRAST  Result Date: 04/29/2020 CLINICAL DATA:  Abdominal pain, diverticulitis EXAM: CT ABDOMEN AND PELVIS WITH CONTRAST TECHNIQUE: Multidetector CT imaging of the abdomen and pelvis was performed using the standard protocol following bolus administration of intravenous contrast. CONTRAST:  169m OMNIPAQUE IOHEXOL 300 MG/ML  SOLN COMPARISON:  04/13/2020 FINDINGS: Lower chest: No acute pleural or parenchymal lung disease Hepatobiliary: Diffuse hepatic steatosis. No focal abnormality. The gallbladder is unremarkable. Pancreas: Unremarkable. No pancreatic ductal dilatation or surrounding inflammatory changes. Spleen: Normal in size without focal abnormality. Adrenals/Urinary Tract: Adrenal glands are unremarkable. Kidneys are normal, without renal calculi, focal lesion, or hydronephrosis. Bladder is unremarkable. Stomach/Bowel: Wall thickening and pericolonic fat stranding within the sigmoid colon again noted unchanged. There is a small gas/fluid collection in the left lower quadrant measuring 3.0 x 2.7 cm consistent with focal contained perforation. No evidence of peripheral enhancement or abscess at this time. No bowel obstruction or ileus. Normal appendix right lower quadrant. Vascular/Lymphatic: Aortic atherosclerosis. No enlarged abdominal or pelvic lymph nodes. Reproductive: Prostate is unremarkable. Other: No abdominal wall hernia or abnormality. No abdominopelvic ascites. Musculoskeletal: No acute or destructive bony lesions. Reconstructed images demonstrate no additional findings. IMPRESSION: 1. Continued findings of acute sigmoid diverticulitis. There is continued focal contained perforation, with small gas/fluid collection as above. No evidence of well-formed abscess at this time. 2. Hepatic steatosis. 3.   Aortic Atherosclerosis (ICD10-I70.0). Electronically Signed   By: MRanda NgoM.D.   On: 04/29/2020 02:23    Anti-infectives (From admission, onward)   Start     Dose/Rate Route Frequency Ordered Stop   04/29/20 1400  piperacillin-tazobactam (ZOSYN) IVPB 3.375 g  Status:  Discontinued     3.375 g 100 mL/hr over 30 Minutes Intravenous Every 8 hours 04/29/20 1118 04/29/20 1124   04/29/20 1000  piperacillin-tazobactam (ZOSYN) IVPB 3.375 g     3.375 g 12.5 mL/hr over 240 Minutes Intravenous Every 8 hours 04/29/20 0430     04/29/20 0330  piperacillin-tazobactam (ZOSYN) IVPB 3.375 g     3.375 g 100 mL/hr over 30 Minutes Intravenous  Once 04/29/20 0327 04/29/20 0423       Assessment/Plan HTN GERD Nephrolithiasis OSA  Obesity BMI 33  Acute sigmoid diverticulitis with perforation Acute cholecystitis - admitted 04/13/20 through 04/19/20 with perforated sigmoid diverticulitis, treated with 7 days of zosyn and 7 days augmentin which he completed 5/9 - readmitted 5/10 with worsening abdominal pain, although pain now more epigastric - u/s shows mild gallbladder wall thickening, pericholecystic fluid and gallbladder sludge, cannot rule out acute acalculous cholecystitis. LFTs and lipase WNL - CT scan fairly similar with little to no improvement in diverticulitis with continued focal contained perforation and small gas/fluid collection 3x2.7cm, no evidence of well-formed abscess  - Discussed treatment options including continued trial of nonoperative management with antibiotics and monitoring; if pain and leukocytosis resolve and patient is able to tolerate diet advancement we could discharge home with a longer course of antibiotics and plan for GI and surgical follow up as previously set out. With his possible cholecystitis, the other option would be laparoscopic cholecystectomy with wash out and drain placement for diverticulitis. Patient agrees to proceed with surgery. Will plan for laparoscopic  cholecystectomy, diagnostic laparoscopy with wash out and drain placement tomorrow. NPO after midnight.  ID - zosyn 5/11>> VTE - SCDs, lovenox FEN - IVF, CLD Foley - none  Wellington Hampshire, PA-C [facility] 04/29/2020, 11:32 AM Please see Amion for pager number during day hours 7:00am-4:30pm

## 2020-04-29 NOTE — ED Provider Notes (Signed)
[facility] EMERGENCY DEPARTMENT Provider Note   CSN: [id number] Arrival date & time: 04/29/20  0015     History Chief Complaint  Patient presents with  . Abdominal Pain    Jordan Buchanan is a 49 y.o. male.  Patient here with epigastric and right upper quadrant pain that woke him from sleep approximately 9 PM.  States the pain is constant and radiates to his back.  He has had multiple episodes of nausea and vomiting.  He was at [place] long but left without being seen after labs were drawn.  He denies any chest pain or shortness of breath.  He denies any fever.  Denies any pain with urination or blood in the urine.  Patient was notably admitted April 25 for perforated diverticulitis and spent 5 days in the hospital.  He was discharged on May 1.  States he completed 7 days of Augmentin at home.  He was feeling better at home and did not have any pain again until this evening.  He states this pain is higher up in his abdomen than it was before and now on the right side instead of left but he has similar back pain as he did before.  He still has a gallbladder.  Also has a history of acid reflux but this feels different.  The pain is sharp and constant in his epigastrium and right upper quadrant and is severe.  It radiates to his low back and reminds him of when he had diverticulitis but the pain is higher up in the abdomen today.  The history is provided by the patient and the spouse.  Abdominal Pain Associated symptoms: nausea and vomiting   Associated symptoms: no chest pain, no cough, no diarrhea, no dysuria, no fever, no hematuria and no shortness of breath        Past Medical History:  Diagnosis Date  . Arrhythmia    PALPITATIONS AND PVC - improved with cutting down on caffeine  . Cough variant asthma 05/28/2014   05/27/2014 p extensive coaching HFA effectiveness =    75% > try dulera 100 2bid  - PFT's 07/03/2014 FEV1 4.15 (98%) ratio 89 and no chang p B2 and nl fef25-75  And nl  dlco    . Diverticulosis 07/2015   by CT scan  . GERD (gastroesophageal reflux disease)   . Hepatic steatosis   . Hypertension    h/o LVH, resolved  . Migraines   . Perforated diverticulum of large intestine 03/2020   hospitalization  . Prostatitis 05/09/2013  . Seasonal allergies   . 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 Estanislado Pandy)  . Tremor   . Urticaria     Patient Active Problem List   Diagnosis Date Noted  . Diverticulitis of colon with perforation 04/13/2020  . Incontinence of feces 08/27/2019  . Health maintenance examination 09/08/2018  . Chest pain 07/04/2018  . Urticaria 04/24/2018  . Skin rash 04/24/2018  . Family history of multiple sclerosis 04/27/2017  . Former smoker 04/27/2017  . Spondyloarthropathy   . OSA (obstructive sleep apnea) 08/19/2016  . Low back pain radiating to left lower extremity 08/06/2016  . Obesity, [name], BMI 30.0-34.9 (see actual BMI) 04/21/2016  . Cough, persistent 03/10/2016  . LLQ abdominal pain 07/31/2015  . Cough variant asthma  05/28/2014  . Pruritic condition 04/18/2014  . Hepatic steatosis   . Migraines   . Perennial allergic rhinitis   . GERD (gastroesophageal reflux disease)   .  Hypertension     Past Surgical History:  Procedure Laterality Date  . ESOPHAGOGASTRODUODENOSCOPY  08/2018   esophagus dilated, biopsy showed chronic gastritis with mod reflux changes [facility])  . NASAL RECONSTRUCTION WITH SEPTAL REPAIR  2017   [place]  . OTHER SURGICAL HISTORY  1992   facial surgery  . ROTATOR CUFF REPAIR  2009   left  . SINOSCOPY    . UMBILICAL HERNIA REPAIR  05/10/2012   Procedure: HERNIA REPAIR UMBILICAL ADULT;  Surgeon: Harl Bowie, MD;  Location: [facility];  Service: General;  Laterality: N/A;       Family History  Problem Relation Age of Onset  . Asthma Father   . Arthritis Father        father, brother, Pgrandfather  . Chronic bronchitis Father   .  Allergies Father   . Hypertension Brother        obese  . CAD Paternal Grandfather        MI x2  . Multiple sclerosis Brother   . Stroke Neg Hx   . Cancer Neg Hx     Social History   Tobacco Use  . Smoking status: Former Smoker    Packs/day: 1.00    Years: 13.00    Pack years: 13.00    Types: Cigarettes    Quit date: 12/21/1999    Years since quitting: 20.3  . Smokeless tobacco: Former Systems developer    Types: Snuff    Quit date: 12/20/2013  Substance Use Topics  . Alcohol use: No    Alcohol/week: 0.0 standard drinks    Comment: quit 2002  . Drug use: Not Currently    Types: Marijuana    Comment: quit 2002    Home Medications Prior to Admission medications   Medication Sig Start Date End Date Taking? Authorizing Provider  albuterol (VENTOLIN HFA) 108 (90 Base) MCG/ACT inhaler Inhale 2 puffs into the lungs every 4 (four) hours as needed for wheezing or shortness of breath. 03/30/17   Parrett, Fonnie Mu, NP  amLODipine (NORVASC) 10 MG tablet Take 1 tablet (10 mg total) by mouth daily. 03/14/20   Ria Bush, MD  amoxicillin-clavulanate (AUGMENTIN) 875-125 MG tablet Take 1 tablet by mouth 2 (two) times daily. 04/19/20   Michael Boston, MD  cetirizine (ZYRTEC) 10 MG tablet Take 10 mg by mouth daily as needed for allergies.     [provider]  diclofenac sodium (VOLTAREN) 1 % GEL Apply 2 g topically 4 (four) times daily. Patient not taking: Reported on 04/13/2020 12/25/18   Owens Loffler, MD  meloxicam (MOBIC) 15 MG tablet TAKE 1 TABLET BY MOUTH EVERY DAY Patient not taking: Reported on 04/13/2020 11/23/19   Edrick Kins, DPM  metoprolol succinate (TOPROL-XL) 50 MG 24 hr tablet Take 1 tablet (50 mg total) by mouth daily. Take with or immediately following a meal. 03/14/20   Ria Bush, MD  Multiple Vitamin (MULTIVITAMIN WITH MINERALS) TABS tablet Take 1 tablet by mouth daily.    [provider]  omega-3 acid ethyl esters (LOVAZA) 1 g capsule Take 1 g by mouth daily.     [provider]  pantoprazole (PROTONIX) 40 MG tablet Take 1 tablet by mouth 2 (two) times daily. 09/06/19 09/05/20  [provider]    Allergies    Hctz [hydrochlorothiazide]  Review of Systems   Review of Systems  Constitutional: Positive for activity change and appetite change. Negative for fever.  HENT: Negative for congestion and rhinorrhea.   Eyes:  Negative for visual disturbance.  Respiratory: Negative for cough, chest tightness and shortness of breath.   Cardiovascular: Negative for chest pain.  Gastrointestinal: Positive for abdominal pain, nausea and vomiting. Negative for diarrhea.  Genitourinary: Negative for dysuria and hematuria.  Musculoskeletal: Positive for back pain.  Skin: Negative for rash.  Neurological: Negative for dizziness, weakness and headaches.   all other systems are negative except as noted in the HPI and PMH.    Physical Exam Updated Vital Signs BP (!) 152/98 (BP Location: Right Arm)   Pulse 73   Temp 97.8 F (36.6 C) (Oral)   Resp 18   Ht 5' 10"  (1.778 m)   Wt 104.3 kg   SpO2 100%   BMI 33.00 kg/m   Physical Exam Vitals and nursing note reviewed.  Constitutional:      General: He is not in acute distress.    Appearance: He is well-developed.     Comments: uncomfortable  HENT:     Head: Normocephalic and atraumatic.     Mouth/Throat:     Pharynx: No oropharyngeal exudate.  Eyes:     Conjunctiva/sclera: Conjunctivae normal.     Pupils: Pupils are equal, round, and reactive to light.  Neck:     Comments: No meningismus. Cardiovascular:     Rate and Rhythm: Normal rate and regular rhythm.     Heart sounds: Normal heart sounds. No murmur.  Pulmonary:     Effort: Pulmonary effort is normal. No respiratory distress.     Breath sounds: Normal breath sounds.  Abdominal:     Palpations: Abdomen is soft.     Tenderness: There is abdominal tenderness. There is no guarding or rebound.     Comments: Diffuse abdominal  tenderness worse in the right upper quadrant epigastrium.  Musculoskeletal:        General: Tenderness present. Normal range of motion.     Cervical back: Normal range of motion and neck supple.     Comments: R paraspinal lumbar tenderness  Skin:    General: Skin is warm.  Neurological:     Mental Status: He is alert and oriented to person, place, and time.     Cranial Nerves: No cranial nerve deficit.     Motor: No abnormal muscle tone.     Coordination: Coordination normal.     Comments: No ataxia on finger to nose bilaterally. No pronator drift. 5/5 strength throughout. CN 2-12 intact.Equal grip strength. Sensation intact.   Psychiatric:        Behavior: Behavior normal.     ED Results / Procedures / Treatments   Labs (all labs ordered are listed, but only abnormal results are displayed) Labs Reviewed  SARS CORONAVIRUS 2 BY RT PCR ([facility])  D-DIMER, QUANTITATIVE (NOT AT [facility])  LACTIC ACID, PLASMA  URINALYSIS, ROUTINE W REFLEX MICROSCOPIC  TROPONIN I (HIGH SENSITIVITY)    EKG EKG Interpretation  Date/Time:  Tuesday Apr 29 2020 02:48:10 EDT Ventricular Rate:  81 PR Interval:    QRS Duration: 95 QT Interval:  365 QTC Calculation: 424 R Axis:   64 Text Interpretation: Sinus rhythm Prolonged PR interval No significant change was found Confirmed by Ezequiel Essex [phone]) on 04/29/2020 3:00:49 AM   Radiology CT ABDOMEN PELVIS W CONTRAST  Result Date: 04/29/2020 CLINICAL DATA:  Abdominal pain, diverticulitis EXAM: CT ABDOMEN AND PELVIS WITH CONTRAST TECHNIQUE: Multidetector CT imaging of the abdomen and pelvis was performed using the standard protocol following bolus administration of  intravenous contrast. CONTRAST:  189m OMNIPAQUE IOHEXOL 300 MG/ML  SOLN COMPARISON:  04/13/2020 FINDINGS: Lower chest: No acute pleural or parenchymal lung disease Hepatobiliary: Diffuse hepatic steatosis. No focal abnormality. The gallbladder is  unremarkable. Pancreas: Unremarkable. No pancreatic ductal dilatation or surrounding inflammatory changes. Spleen: Normal in size without focal abnormality. Adrenals/Urinary Tract: Adrenal glands are unremarkable. Kidneys are normal, without renal calculi, focal lesion, or hydronephrosis. Bladder is unremarkable. Stomach/Bowel: Wall thickening and pericolonic fat stranding within the sigmoid colon again noted unchanged. There is a small gas/fluid collection in the left lower quadrant measuring 3.0 x 2.7 cm consistent with focal contained perforation. No evidence of peripheral enhancement or abscess at this time. No bowel obstruction or ileus. Normal appendix right lower quadrant. Vascular/Lymphatic: Aortic atherosclerosis. No enlarged abdominal or pelvic lymph nodes. Reproductive: Prostate is unremarkable. Other: No abdominal wall hernia or abnormality. No abdominopelvic ascites. Musculoskeletal: No acute or destructive bony lesions. Reconstructed images demonstrate no additional findings. IMPRESSION: 1. Continued findings of acute sigmoid diverticulitis. There is continued focal contained perforation, with small gas/fluid collection as above. No evidence of well-formed abscess at this time. 2. Hepatic steatosis. 3.  Aortic Atherosclerosis (ICD10-I70.0). Electronically Signed   By: MRanda NgoM.D.   On: 04/29/2020 02:23    Procedures .Critical Care Performed by: REzequiel Essex MD Authorized by: REzequiel Essex MD   Critical care provider statement:    Critical care time (minutes):  35   Critical care time was exclusive of: perforated diverticulitis.   Critical care was time spent personally by me on the following activities:  Discussions with consultants, evaluation of patient's response to treatment, examination of patient, ordering and performing treatments and interventions, ordering and review of laboratory studies, ordering and review of radiographic studies, pulse oximetry, re-evaluation of  patient's condition, obtaining history from patient or surrogate and review of old charts   (including critical care time)  Medications Ordered in ED Medications  fentaNYL (SUBLIMAZE) injection 50 mcg (50 mcg Intravenous Given 04/29/20 0151)  sodium chloride 0.9 % bolus 1,000 mL (has no administration in time range)  HYDROmorphone (DILAUDID) injection 1 mg (has no administration in time range)  ondansetron (ZOFRAN) injection 4 mg (has no administration in time range)  ondansetron (ZOFRAN) injection 4 mg (4 mg Intravenous Given 04/29/20 0150)  iohexol (OMNIPAQUE) 300 MG/ML solution 100 mL (100 mLs Intravenous Contrast Given 04/29/20 0203)    ED Course  I have reviewed the triage vital signs and the nursing notes.  Pertinent labs & imaging results that were available during my care of the patient were reviewed by me and considered in my medical decision making (see chart for details).    MDM Rules/Calculators/A&P                     Sudden onset of upper abdominal pain with nausea and vomiting.  Admission for diverticulitis 10 days ago which seemed to resolve. Completed 5 days of IV antibiotics and 7 days of antibiotics at home.   Labs today show no leukocytosis.  LFTs and lipase are normal. Lactate normal.   Patient given IV fluids and symptom control.  EKG is sinus rhythm.  Troponin is negative.  Low suspicion for ACS or PE.  Repeat CT scan shows essentially unchanged perforated diverticulitis with fluid collection 3 x 3 cm. No abscess at this time.  Patient feels improved.  Still has abdominal pain and low back pain more on the right side.  Discussed with patient it is unlikely that  he is two separate processes going on and this pain is likely still related to his diverticulitis with perforation. However, will plan for RUQ ultrasound for sake of completeness.  We will restart IV Zosyn. With continued symptoms and unchanged CT findings, patient would benefit from continued antibiotics  and surgical consultation in the morning.  He is agreeable.  Admission discussed with Dr. Alcario Drought. Final Clinical Impression(s) / ED Diagnoses Final diagnoses:  RUQ pain  Diverticulitis of large intestine with perforation without bleeding    Rx / DC Orders ED Discharge Orders    None       Kimbra Marcelino, Annie Main, MD 04/29/20 [phone]

## 2020-04-29 NOTE — ED Notes (Signed)
Pt eloped from waiting area. Vs stable.

## 2020-04-29 NOTE — ED Notes (Signed)
Pt transported to CT via stretcher.  

## 2020-04-29 NOTE — Plan of Care (Signed)
49 yo M with recurrent abd pain.  Just admitted 4/25-5/1 with acute sigmoid diverticulitis with contained perf.  Completed 5 or 6 days of inpt zosyn followed by 7 days of augmentin.  Had been feeling better until earlier in evening yesterday when developed recurrent severe abd pain etc.  CT in ED reveals continued contained perf sigmoid diverticulitis.  WBC 10.8k.  Put back on empiric zosyn, sending pt to med-surg INP bed.  Likely will need general surgery consult after he arrives given that hes now failed medical management for diverticulitis.  From last admit DC summary: it sounds like gen surg was hoping to ultimately do segmental colonic resection as an elective procedure after acute diverticulitis had resolved.  Not sure if they still going to be able to do that though.Marland KitchenMarland Kitchen

## 2020-04-30 ENCOUNTER — Inpatient Hospital Stay (HOSPITAL_COMMUNITY): Payer: BC Managed Care – PPO

## 2020-04-30 ENCOUNTER — Inpatient Hospital Stay (HOSPITAL_COMMUNITY): Payer: BC Managed Care – PPO | Admitting: Anesthesiology

## 2020-04-30 ENCOUNTER — Encounter (HOSPITAL_COMMUNITY): Admission: EM | Disposition: A | Discharge status: Home / Self Care | Payer: Self-pay | Source: Home / Self Care

## 2020-04-30 ENCOUNTER — Telehealth: Payer: Self-pay | Admitting: Family Medicine

## 2020-04-30 ENCOUNTER — Encounter (HOSPITAL_COMMUNITY): Payer: Self-pay | Admitting: Internal Medicine

## 2020-04-30 DIAGNOSIS — K8000 Calculus of gallbladder with acute cholecystitis without obstruction: Secondary | ICD-10-CM

## 2020-04-30 DIAGNOSIS — K5732 Diverticulitis of large intestine without perforation or abscess without bleeding: Secondary | ICD-10-CM | POA: Diagnosis not present

## 2020-04-30 DIAGNOSIS — K5720 Diverticulitis of large intestine with perforation and abscess without bleeding: Principal | ICD-10-CM

## 2020-04-30 DIAGNOSIS — K812 Acute cholecystitis with chronic cholecystitis: Secondary | ICD-10-CM | POA: Diagnosis not present

## 2020-04-30 DIAGNOSIS — G4733 Obstructive sleep apnea (adult) (pediatric): Secondary | ICD-10-CM | POA: Diagnosis not present

## 2020-04-30 DIAGNOSIS — R1011 Right upper quadrant pain: Secondary | ICD-10-CM | POA: Diagnosis not present

## 2020-04-30 DIAGNOSIS — G43909 Migraine, unspecified, not intractable, without status migrainosus: Secondary | ICD-10-CM | POA: Diagnosis not present

## 2020-04-30 DIAGNOSIS — Z9049 Acquired absence of other specified parts of digestive tract: Secondary | ICD-10-CM

## 2020-04-30 DIAGNOSIS — K810 Acute cholecystitis: Secondary | ICD-10-CM | POA: Diagnosis not present

## 2020-04-30 DIAGNOSIS — K915 Postcholecystectomy syndrome: Secondary | ICD-10-CM | POA: Diagnosis not present

## 2020-04-30 HISTORY — PX: CHOLECYSTECTOMY: SHX55

## 2020-04-30 HISTORY — DX: Calculus of gallbladder with acute cholecystitis without obstruction: K80.00

## 2020-04-30 HISTORY — PX: LAPAROSCOPY: SHX197

## 2020-04-30 LAB — CBC
HCT: 42.5 % (ref 39.0–52.0)
Hemoglobin: 14.3 g/dL (ref 13.0–17.0)
MCH: 30 pg (ref 26.0–34.0)
MCHC: 33.6 g/dL (ref 30.0–36.0)
MCV: 89.3 fL (ref 80.0–100.0)
Platelets: 355 10*3/uL (ref 150–400)
RBC: 4.76 MIL/uL (ref 4.22–5.81)
RDW: 12.3 % (ref 11.5–15.5)
WBC: 9.5 10*3/uL (ref 4.0–10.5)
nRBC: 0 % (ref 0.0–0.2)

## 2020-04-30 LAB — COMPREHENSIVE METABOLIC PANEL
ALT: 26 U/L (ref 0–44)
AST: 18 U/L (ref 15–41)
Albumin: 3.7 g/dL (ref 3.5–5.0)
Alkaline Phosphatase: 53 U/L (ref 38–126)
Anion gap: 8 (ref 5–15)
BUN: 7 mg/dL (ref 6–20)
CO2: 25 mmol/L (ref 22–32)
Calcium: 8.9 mg/dL (ref 8.9–10.3)
Chloride: 105 mmol/L (ref 98–111)
Creatinine, Ser: 0.9 mg/dL (ref 0.61–1.24)
GFR calc Af Amer: >60 mL/min (ref >60)
GFR calc non Af Amer: >60 mL/min (ref >60)
Glucose, Bld: 93 mg/dL (ref 70–99)
Potassium: 4 mmol/L (ref 3.5–5.1)
Sodium: 138 mmol/L (ref 135–145)
Total Bilirubin: 1.3 mg/dL — ABNORMAL HIGH (ref 0.3–1.2)
Total Protein: 6.6 g/dL (ref 6.5–8.1)

## 2020-04-30 LAB — SURGICAL PCR SCREEN
MRSA, PCR: NEGATIVE
Staphylococcus aureus: NEGATIVE

## 2020-04-30 SURGERY — LAPAROSCOPIC CHOLECYSTECTOMY WITH INTRAOPERATIVE CHOLANGIOGRAM
Anesthesia: General | Site: Abdomen

## 2020-04-30 MED ORDER — SUGAMMADEX SODIUM 200 MG/2ML IV SOLN
INTRAVENOUS | Status: DC | PRN
  Administered 2020-04-30: 220 mg via INTRAVENOUS

## 2020-04-30 MED ORDER — LIDOCAINE 2% (20 MG/ML) 5 ML SYRINGE
INTRAMUSCULAR | Status: DC | PRN
  Administered 2020-04-30: 60 mg via INTRAVENOUS
  Administered 2020-04-30 (×2): 10 mg via INTRAVENOUS

## 2020-04-30 MED ORDER — ACETAMINOPHEN 500 MG PO TABS
1000.0000 mg | ORAL_TABLET | Freq: Three times a day (TID) | ORAL | Status: DC
  Administered 2020-04-30 – 2020-05-03 (×9): 1000 mg via ORAL
  Filled 2020-04-30 (×9): qty 2

## 2020-04-30 MED ORDER — LIP MEDEX EX OINT
1.0000 "application " | TOPICAL_OINTMENT | Freq: Two times a day (BID) | CUTANEOUS | Status: DC
  Administered 2020-04-30 – 2020-05-03 (×7): 1 via TOPICAL
  Filled 2020-04-30: qty 7

## 2020-04-30 MED ORDER — LACTATED RINGERS IR SOLN
Status: DC | PRN
  Administered 2020-04-30 (×2): 1000 mL

## 2020-04-30 MED ORDER — SUGAMMADEX SODIUM 500 MG/5ML IV SOLN
INTRAVENOUS | Status: AC
  Filled 2020-04-30: qty 5

## 2020-04-30 MED ORDER — MIDAZOLAM HCL 2 MG/2ML IJ SOLN
INTRAMUSCULAR | Status: AC
  Filled 2020-04-30: qty 2

## 2020-04-30 MED ORDER — BUPIVACAINE LIPOSOME 1.3 % IJ SUSP
20.0000 mL | Freq: Once | INTRAMUSCULAR | Status: AC
  Administered 2020-04-30: 20 mL
  Filled 2020-04-30: qty 20

## 2020-04-30 MED ORDER — LACTATED RINGERS IV BOLUS: 1000.0000 mL | Freq: Three times a day (TID) | INTRAVENOUS | Status: AC | PRN

## 2020-04-30 MED ORDER — HYDROMORPHONE HCL 1 MG/ML IJ SOLN
INTRAMUSCULAR | Status: AC
  Filled 2020-04-30: qty 1

## 2020-04-30 MED ORDER — PHENOL 1.4 % MT LIQD: 1.0000 | OROMUCOSAL | Status: DC | PRN

## 2020-04-30 MED ORDER — ALUM & MAG HYDROXIDE-SIMETH 200-200-20 MG/5ML PO SUSP
30.0000 mL | Freq: Four times a day (QID) | ORAL | Status: DC | PRN
  Administered 2020-05-02: 30 mL via ORAL
  Filled 2020-04-30: qty 30

## 2020-04-30 MED ORDER — BUPIVACAINE HCL (PF) 0.25 % IJ SOLN
INTRAMUSCULAR | Status: DC | PRN
  Administered 2020-04-30: 60 mL

## 2020-04-30 MED ORDER — MENTHOL 3 MG MT LOZG: 1.0000 | LOZENGE | OROMUCOSAL | Status: DC | PRN

## 2020-04-30 MED ORDER — KETOROLAC TROMETHAMINE 30 MG/ML IJ SOLN
INTRAMUSCULAR | Status: DC | PRN
Start: 2020-04-30 — End: 2020-04-30
  Administered 2020-04-30: 30 mg via INTRAVENOUS

## 2020-04-30 MED ORDER — LIDOCAINE 2% (20 MG/ML) 5 ML SYRINGE
INTRAMUSCULAR | Status: AC
  Filled 2020-04-30: qty 5

## 2020-04-30 MED ORDER — ONDANSETRON HCL 4 MG/2ML IJ SOLN
INTRAMUSCULAR | Status: DC | PRN
  Administered 2020-04-30: 4 mg via INTRAVENOUS

## 2020-04-30 MED ORDER — HYDROCORTISONE 1 % EX CREA
1.0000 "application " | TOPICAL_CREAM | Freq: Three times a day (TID) | CUTANEOUS | Status: DC | PRN
  Filled 2020-04-30: qty 28

## 2020-04-30 MED ORDER — SODIUM CHLORIDE 0.9 % IV SOLN
INTRAVENOUS | Status: AC
  Filled 2020-04-30: qty 2

## 2020-04-30 MED ORDER — SODIUM CHLORIDE 0.9 % IV SOLN
INTRAVENOUS | Status: DC | PRN
  Administered 2020-04-30: 3.5 mL

## 2020-04-30 MED ORDER — FENTANYL CITRATE (PF) 250 MCG/5ML IJ SOLN
INTRAMUSCULAR | Status: AC
  Filled 2020-04-30: qty 5

## 2020-04-30 MED ORDER — OXYCODONE HCL 5 MG/5ML PO SOLN: 5.0000 mg | Freq: Once | ORAL | Status: DC | PRN

## 2020-04-30 MED ORDER — IBUPROFEN 400 MG PO TABS
800.0000 mg | ORAL_TABLET | Freq: Four times a day (QID) | ORAL | Status: DC
  Administered 2020-04-30 – 2020-05-01 (×6): 800 mg via ORAL
  Filled 2020-04-30 (×6): qty 2

## 2020-04-30 MED ORDER — PROPOFOL 10 MG/ML IV BOLUS
INTRAVENOUS | Status: AC
  Filled 2020-04-30: qty 20

## 2020-04-30 MED ORDER — GABAPENTIN 300 MG PO CAPS
300.0000 mg | ORAL_CAPSULE | Freq: Two times a day (BID) | ORAL | Status: DC
  Administered 2020-04-30 – 2020-05-03 (×6): 300 mg via ORAL
  Filled 2020-04-30 (×7): qty 1

## 2020-04-30 MED ORDER — SODIUM CHLORIDE 0.9% FLUSH: 3.0000 mL | Freq: Two times a day (BID) | INTRAVENOUS | Status: DC

## 2020-04-30 MED ORDER — SODIUM CHLORIDE 0.9 % IR SOLN
Status: DC | PRN
  Administered 2020-04-30: 1000 mL

## 2020-04-30 MED ORDER — BISACODYL 10 MG RE SUPP: 10.0000 mg | Freq: Two times a day (BID) | RECTAL | Status: DC | PRN

## 2020-04-30 MED ORDER — SODIUM CHLORIDE 0.9 % IV SOLN
INTRAVENOUS | Status: DC
  Filled 2020-04-30: qty 6

## 2020-04-30 MED ORDER — BUPIVACAINE HCL (PF) 0.25 % IJ SOLN
INTRAMUSCULAR | Status: AC
  Filled 2020-04-30: qty 60

## 2020-04-30 MED ORDER — SODIUM CHLORIDE 0.9 % IV SOLN: 250.0000 mL | INTRAVENOUS | Status: DC | PRN

## 2020-04-30 MED ORDER — MIDAZOLAM HCL 2 MG/2ML IJ SOLN
INTRAMUSCULAR | Status: DC | PRN
  Administered 2020-04-30: 2 mg via INTRAVENOUS

## 2020-04-30 MED ORDER — PROMETHAZINE HCL 25 MG/ML IJ SOLN: 6.2500 mg | INTRAMUSCULAR | Status: DC | PRN

## 2020-04-30 MED ORDER — OXYCODONE HCL 5 MG PO TABS: 5.0000 mg | ORAL_TABLET | Freq: Once | ORAL | Status: DC | PRN

## 2020-04-30 MED ORDER — PIPERACILLIN-TAZOBACTAM 3.375 G IVPB 30 MIN
3.3750 g | INTRAVENOUS | Status: AC
  Administered 2020-04-30: 3.375 g via INTRAVENOUS
  Filled 2020-04-30: qty 50

## 2020-04-30 MED ORDER — SODIUM CHLORIDE 0.9 % IV SOLN
INTRAVENOUS | Status: DC | PRN
  Administered 2020-04-30: 2 g via INTRAVENOUS

## 2020-04-30 MED ORDER — MAGIC MOUTHWASH
15.0000 mL | Freq: Four times a day (QID) | ORAL | Status: DC | PRN
  Filled 2020-04-30: qty 15

## 2020-04-30 MED ORDER — HYDROMORPHONE HCL 1 MG/ML IJ SOLN
0.2500 mg | INTRAMUSCULAR | Status: DC | PRN
  Administered 2020-04-30 (×4): 0.5 mg via INTRAVENOUS

## 2020-04-30 MED ORDER — ROCURONIUM BROMIDE 10 MG/ML (PF) SYRINGE
PREFILLED_SYRINGE | INTRAVENOUS | Status: AC
  Filled 2020-04-30: qty 10

## 2020-04-30 MED ORDER — ROCURONIUM BROMIDE 10 MG/ML (PF) SYRINGE
PREFILLED_SYRINGE | INTRAVENOUS | Status: DC | PRN
  Administered 2020-04-30: 60 mg via INTRAVENOUS

## 2020-04-30 MED ORDER — SODIUM CHLORIDE 0.9% FLUSH: 3.0000 mL | INTRAVENOUS | Status: DC | PRN

## 2020-04-30 MED ORDER — HYDROMORPHONE HCL 1 MG/ML IJ SOLN
0.2500 mg | INTRAMUSCULAR | Status: DC | PRN
  Administered 2020-04-30 (×3): 0.5 mg via INTRAVENOUS

## 2020-04-30 MED ORDER — PIPERACILLIN-TAZOBACTAM 3.375 G IVPB
INTRAVENOUS | Status: AC
  Filled 2020-04-30: qty 50

## 2020-04-30 MED ORDER — BUPIVACAINE HCL 0.25 % IJ SOLN
INTRAMUSCULAR | Status: AC
  Filled 2020-04-30: qty 2

## 2020-04-30 MED ORDER — DEXAMETHASONE SODIUM PHOSPHATE 10 MG/ML IJ SOLN
INTRAMUSCULAR | Status: AC
  Filled 2020-04-30: qty 1

## 2020-04-30 MED ORDER — PROCHLORPERAZINE EDISYLATE 10 MG/2ML IJ SOLN: 5.0000 mg | INTRAMUSCULAR | Status: DC | PRN

## 2020-04-30 MED ORDER — DEXAMETHASONE SODIUM PHOSPHATE 10 MG/ML IJ SOLN
INTRAMUSCULAR | Status: DC | PRN
  Administered 2020-04-30: 10 mg via INTRAVENOUS

## 2020-04-30 MED ORDER — GUAIFENESIN-DM 100-10 MG/5ML PO SYRP: 10.0000 mL | ORAL_SOLUTION | ORAL | Status: DC | PRN

## 2020-04-30 MED ORDER — ONDANSETRON HCL 4 MG/2ML IJ SOLN
INTRAMUSCULAR | Status: AC
  Filled 2020-04-30: qty 2

## 2020-04-30 MED ORDER — PROPOFOL 10 MG/ML IV BOLUS
INTRAVENOUS | Status: DC | PRN
  Administered 2020-04-30: 150 mg via INTRAVENOUS

## 2020-04-30 MED ORDER — SODIUM CHLORIDE 0.9 % IV SOLN
INTRAVENOUS | Status: DC | PRN
  Administered 2020-04-30: 1000 mL

## 2020-04-30 MED ORDER — HYDROCORTISONE (PERIANAL) 2.5 % EX CREA
1.0000 "application " | TOPICAL_CREAM | Freq: Four times a day (QID) | CUTANEOUS | Status: DC | PRN
  Filled 2020-04-30: qty 28.35

## 2020-04-30 MED ORDER — FENTANYL CITRATE (PF) 250 MCG/5ML IJ SOLN
INTRAMUSCULAR | Status: DC | PRN
  Administered 2020-04-30 (×3): 50 ug via INTRAVENOUS
  Administered 2020-04-30: 100 ug via INTRAVENOUS

## 2020-04-30 MED ORDER — LACTATED RINGERS IV SOLN: INTRAVENOUS | Status: DC

## 2020-04-30 MED ORDER — SIMETHICONE 40 MG/0.6ML PO SUSP
40.0000 mg | Freq: Four times a day (QID) | ORAL | Status: DC | PRN
  Administered 2020-05-02: 40 mg via ORAL
  Filled 2020-04-30 (×3): qty 0.6

## 2020-04-30 SURGICAL SUPPLY — 52 items
APPLIER CLIP 5 13 M/L LIGAMAX5 (MISCELLANEOUS)
APPLIER CLIP ROT 10 11.4 M/L (STAPLE) ×2
CABLE HIGH FREQUENCY MONO STRZ (ELECTRODE) ×2 IMPLANT
CLIP APPLIE 5 13 M/L LIGAMAX5 (MISCELLANEOUS) IMPLANT
CLIP APPLIE ROT 10 11.4 M/L (STAPLE) IMPLANT
COVER MAYO STAND STRL (DRAPES) ×2 IMPLANT
COVER SURGICAL LIGHT HANDLE (MISCELLANEOUS) ×2 IMPLANT
COVER WAND RF STERILE (DRAPES) ×2 IMPLANT
DECANTER SPIKE VIAL GLASS SM (MISCELLANEOUS) ×2 IMPLANT
DEVICE TROCAR PUNCTURE CLOSURE (ENDOMECHANICALS) ×1 IMPLANT
DRAIN CHANNEL 19F RND (DRAIN) ×1 IMPLANT
DRAPE C-ARM 42X120 X-RAY (DRAPES) ×2 IMPLANT
DRAPE UTILITY XL STRL (DRAPES) ×2 IMPLANT
DRAPE WARM FLUID 44X44 (DRAPES) ×2 IMPLANT
DRSG TEGADERM 2-3/8X2-3/4 SM (GAUZE/BANDAGES/DRESSINGS) ×7 IMPLANT
DRSG TEGADERM 4X4.75 (GAUZE/BANDAGES/DRESSINGS) ×1 IMPLANT
ELECT REM PT RETURN 15FT ADLT (MISCELLANEOUS) ×2 IMPLANT
ENDOLOOP SUT PDS II  0 18 (SUTURE)
ENDOLOOP SUT PDS II 0 18 (SUTURE) IMPLANT
GAUZE SPONGE 2X2 8PLY STRL LF (GAUZE/BANDAGES/DRESSINGS) ×1 IMPLANT
GLOVE ECLIPSE 8.0 STRL XLNG CF (GLOVE) ×2 IMPLANT
GLOVE INDICATOR 8.0 STRL GRN (GLOVE) ×2 IMPLANT
GOWN STRL REUS W/TWL XL LVL3 (GOWN DISPOSABLE) ×6 IMPLANT
IRRIG SUCT STRYKERFLOW 2 WTIP (MISCELLANEOUS) ×2
IRRIGATION SUCT STRKRFLW 2 WTP (MISCELLANEOUS) ×1 IMPLANT
KIT BASIN (CUSTOM PROCEDURE TRAY) ×2 IMPLANT
KIT TURNOVER KIT A (KITS) IMPLANT
PAD POSITIONING PINK XL (MISCELLANEOUS) ×2 IMPLANT
PENCIL SMOKE EVACUATOR (MISCELLANEOUS) IMPLANT
POUCH RETRIEVAL ECOSAC 10 (ENDOMECHANICALS) ×1 IMPLANT
POUCH RETRIEVAL ECOSAC 10MM (ENDOMECHANICALS) ×2
PROTECTOR NERVE ULNAR (MISCELLANEOUS) ×1 IMPLANT
SCISSORS LAP 5X35 DISP (ENDOMECHANICALS) ×2 IMPLANT
SEALER TISSUE G2 STRG ARTC 35C (ENDOMECHANICALS) IMPLANT
SET CHOLANGIOGRAPH MIX (MISCELLANEOUS) ×2 IMPLANT
SET TUBE SMOKE EVAC HIGH FLOW (TUBING) ×2 IMPLANT
SLEEVE XCEL OPT CAN 5 100 (ENDOMECHANICALS) ×4 IMPLANT
SPONGE GAUZE 2X2 STER 10/PKG (GAUZE/BANDAGES/DRESSINGS) ×1
SUT MNCRL AB 4-0 PS2 18 (SUTURE) ×3 IMPLANT
SUT SILK 2 0 (SUTURE) ×2
SUT SILK 2 0 SH CR/8 (SUTURE) ×2 IMPLANT
SUT SILK 2-0 18XBRD TIE 12 (SUTURE) ×1 IMPLANT
SUT SILK 3 0 (SUTURE) ×2
SUT SILK 3 0 SH CR/8 (SUTURE) ×2 IMPLANT
SUT SILK 3-0 18XBRD TIE 12 (SUTURE) ×1 IMPLANT
SYR 20ML LL LF (SYRINGE) ×2 IMPLANT
TOWEL OR 17X26 10 PK STRL BLUE (TOWEL DISPOSABLE) ×2 IMPLANT
TOWEL OR NON WOVEN STRL DISP B (DISPOSABLE) ×2 IMPLANT
TRAY FOLEY MTR SLVR 16FR STAT (SET/KITS/TRAYS/PACK) ×1 IMPLANT
TRAY LAPAROSCOPIC (CUSTOM PROCEDURE TRAY) ×2 IMPLANT
TROCAR BLADELESS OPT 5 100 (ENDOMECHANICALS) ×2 IMPLANT
TROCAR XCEL NON-BLD 11X100MML (ENDOMECHANICALS) ×2 IMPLANT

## 2020-04-30 NOTE — Interval H&P Note (Signed)
History and Physical Interval Note:  04/30/2020 9:58 AM  Jordan Buchanan  has presented today for surgery, with the diagnosis of ACUTE CHOLYCYSTITS, ACUTE SIGMOID DIVERTICULITIS.  The various methods of treatment have been discussed with the patient and family. After consideration of risks, benefits and other options for treatment, the patient has consented to  Procedure(s): LAPAROSCOPIC CHOLECYSTECTOMY WITH POSSIBLE INTRAOPERATIVE CHOLANGIOGRAM (N/A) LAPAROSCOPY DIAGNOSTIC WITH WASHOUT AND DRAINAGE PLACEMENT (N/A) as a surgical intervention.  The patient's history has been reviewed, patient examined, no change in status, stable for surgery.  I have reviewed the patient's chart and labs.  Questions were answered to the patient's satisfaction.     Plan lap washout & cholecystectomy  The anatomy & physiology of hepatobiliary & pancreatic function was discussed.  The pathophysiology of gallbladder dysfunction was discussed.  Natural history risks without surgery was discussed.   I feel the risks of no intervention will lead to serious problems that outweigh the operative risks; therefore, I recommended cholecystectomy to remove the pathology.  I explained laparoscopic techniques with possible need for an open approach.  Probable cholangiogram to evaluate the bilary tract was explained as well.    Risks such as bleeding, infection, diarrhea and other bowel changes, abscess, leak, injury to other organs, need for repair of tissues / organs, need for further treatment, stroke, heart attack, death, and other risks were discussed.  I noted a good likelihood this will help address the problem, but there is a chance it may not help.  Possibility that this will not correct all abdominal symptoms was explained.  Goals of post-operative recovery were discussed as well.  We will work to minimize complications.  An educational handout further explaining the pathology and treatment options was given as well.  Questions  were answered.  The patient expresses understanding & wishes to proceed with surgery.  I have re-reviewed the the patient's records, history, medications, and allergies.  I have re-examined the patient.  I again discussed intraoperative plans and goals of post-operative recovery.  The patient agrees to proceed.  Jordan Buchanan  06/28/1971 [id number]  [name], [name], [name], [name], [name], MD as Consulting Physician (General Surgery)  Patient Active Problem List   Diagnosis Date Noted   Acute diverticulitis 04/29/2020   Diverticulitis of colon with perforation 04/13/2020   Incontinence of feces 08/27/2019   Health maintenance examination 09/08/2018   Chest pain 07/04/2018   Urticaria 04/24/2018   Skin rash 04/24/2018   Family history of multiple sclerosis 04/27/2017   Former smoker 04/27/2017   Spondyloarthropathy    OSA (obstructive sleep apnea) 08/19/2016   Low back pain radiating to left lower extremity 08/06/2016   Obesity, [name], BMI 30.0-34.9 (see actual BMI) 04/21/2016   Cough, persistent 03/10/2016   LLQ abdominal pain 07/31/2015   Cough variant asthma  05/28/2014   Pruritic condition 04/18/2014   Hepatic steatosis    Migraines    Perennial allergic rhinitis    GERD (gastroesophageal reflux disease)    Hypertension     Past Medical History:  Diagnosis Date   Arrhythmia    PALPITATIONS AND PVC - improved with cutting down on caffeine   Cough variant asthma 05/28/2014   05/27/2014 p extensive coaching HFA effectiveness =    75% > try dulera 100 2bid  - PFT's 07/03/2014 FEV1 4.15 (98%) ratio 89 and no  chang p B2 and nl fef25-75  And nl dlco     Diverticulosis 07/2015   by CT scan   GERD (gastroesophageal reflux disease)    Hepatic steatosis    Hypertension    h/o LVH, resolved   Migraines     Perforated diverticulum of large intestine 03/2020   hospitalization   Prostatitis 05/09/2013   Seasonal allergies    Spondyloarthropathy    ?AS, HLA-B27 +, CCP neg, prior on humira (Dr. Estanislado Pandy)   Tremor    Urticaria     Past Surgical History:  Procedure Laterality Date   ESOPHAGOGASTRODUODENOSCOPY  08/2018   esophagus dilated, biopsy showed chronic gastritis with mod reflux changes [facility])   NASAL RECONSTRUCTION WITH SEPTAL REPAIR  2017   [place]   OTHER SURGICAL HISTORY  1992   facial surgery   ROTATOR CUFF REPAIR  2009   left   SINOSCOPY     UMBILICAL HERNIA REPAIR  05/10/2012   Procedure: HERNIA REPAIR UMBILICAL ADULT;  Surgeon: Harl Bowie, MD;  Location: [place];  Service: General;  Laterality: N/A;    Social History   Socioeconomic History   Marital status: Married    Spouse name: Not on file   Number of children: 2   Years of education: Not on file   Highest education level: Not on file  Occupational History   Occupation: golf course maintenance    Employer: [employer]  Tobacco Use   Smoking status: Former Smoker    Packs/day: 1.00    Years: 13.00    Pack years: 13.00    Types: Cigarettes    Quit date: 12/21/1999    Years since quitting: 20.3   Smokeless tobacco: Former Systems developer    Types: Snuff    Quit date: 12/20/2013  Substance and Sexual Activity   Alcohol use: No    Alcohol/week: 0.0 standard drinks    Comment: quit 2002   Drug use: Not Currently    Types: Marijuana    Comment: quit 2002   Sexual activity: Not on file  Other Topics Concern   Not on file  Social History Narrative   Lives with wife and 2 daughters, 1 dog   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 [employer]   Edu: HS   Activity: work stays active, some bike riding   Diet: good water, fruits/vegetables some   Social Determinants of Radio broadcast assistant Strain:    Difficulty of Paying Living Expenses:   Food Insecurity:    Worried About Sales executive in the Last Year:    Arboriculturist in the Last Year:   Transportation Needs:    Film/video editor (Medical):    Lack of Transportation (Non-Medical):   Physical Activity:    Days of Exercise per Week:    Minutes of Exercise per Session:   Stress:    Feeling of Stress :   Social Connections:    Frequency of Communication with Friends and Family:    Frequency of Social Gatherings with Friends and Family:    Attends Religious Services:    Active Member of Clubs or Organizations:    Attends Music therapist:    Marital Status:   Intimate Partner Violence:    Fear of Current or Ex-Partner:    Emotionally Abused:    Physically Abused:    Sexually Abused:     Family History  Problem Relation Age of Onset   Asthma Father  Arthritis Father        father, brother, Pgrandfather   Chronic bronchitis Father    Allergies Father    Hypertension Brother        obese   CAD Paternal Grandfather        MI x2   Multiple sclerosis Brother    Stroke Neg Hx    Cancer Neg Hx     Medications Prior to Admission  Medication Sig Dispense Refill Last Dose   albuterol (VENTOLIN HFA) 108 (90 Base) MCG/ACT inhaler Inhale 2 puffs into the lungs every 4 (four) hours as needed for wheezing or shortness of breath. 1 Inhaler 5 Past Week at Unknown time   amLODipine (NORVASC) 10 MG tablet Take 1 tablet (10 mg total) by mouth daily. 90 tablet 3 04/28/2020 at Unknown time   cetirizine (ZYRTEC) 10 MG tablet Take 10 mg by mouth daily.    04/28/2020 at Unknown time   metoprolol succinate (TOPROL-XL) 50 MG 24 hr tablet Take 1 tablet (50 mg total) by mouth daily. Take with or immediately following a meal. 90 tablet 3 04/28/2020 at 0530   Multiple Vitamin (MULTIVITAMIN WITH MINERALS) TABS tablet Take 1 tablet by mouth daily.   04/27/2020   omega-3 acid ethyl esters (LOVAZA) 1 g capsule Take 1 g by mouth daily.   04/28/2020 at Unknown time   pantoprazole (PROTONIX) 40 MG tablet Take 1 tablet by  mouth 2 (two) times daily.   04/28/2020 at Unknown time   amoxicillin-clavulanate (AUGMENTIN) 875-125 MG tablet Take 1 tablet by mouth 2 (two) times daily. (Patient not taking: Reported on 04/29/2020) 14 tablet 1 Not Taking at Unknown time   diclofenac sodium (VOLTAREN) 1 % GEL Apply 2 g topically 4 (four) times daily. (Patient not taking: Reported on 04/13/2020) 3 Tube 5    meloxicam (MOBIC) 15 MG tablet TAKE 1 TABLET BY MOUTH EVERY DAY (Patient not taking: Reported on 04/13/2020) 30 tablet 0     Current Facility-Administered Medications  Medication Dose Route Frequency Provider Last Rate Last Admin   0.9 %  sodium chloride infusion   Intravenous Continuous Darliss Cheney, MD 75 mL/hr at 04/30/20 0158 New Bag at 04/30/20 0158   [MAR Hold] acetaminophen (TYLENOL) tablet 650 mg  650 mg Oral Q6H PRN Darliss Cheney, MD       Or   Doug Sou Hold] acetaminophen (TYLENOL) suppository 650 mg  650 mg Rectal Q6H PRN Darliss Cheney, MD       [MAR Hold] albuterol (PROVENTIL) (2.5 MG/3ML) 0.083% nebulizer solution 2.5 mg  2.5 mg Nebulization Q4H PRN Darliss Cheney, MD       [MAR Hold] amLODipine (NORVASC) tablet 10 mg  10 mg Oral Daily Pahwani, Ravi, MD   10 mg at 04/29/20 1219   bupivacaine liposome (EXPAREL) 1.3 % injection 266 mg  20 mL Infiltration Once Michael Boston, MD       Doug Sou Hold] enoxaparin (LOVENOX) injection 40 mg  40 mg Subcutaneous Q24H Pahwani, Ravi, MD   40 mg at 04/29/20 1221   [MAR Hold] fentaNYL (SUBLIMAZE) injection 50 mcg  50 mcg Intravenous Q30 min PRN Rancour, Annie Main, MD   50 mcg at 04/29/20 0151   [MAR Hold] hydrALAZINE (APRESOLINE) injection 10 mg  10 mg Intravenous Q6H PRN Darliss Cheney, MD       [MAR Hold] HYDROmorphone (DILAUDID) injection 1 mg  1 mg Intravenous Q4H PRN Darliss Cheney, MD   1 mg at 04/29/20 2153   lactated ringers infusion   Intravenous  Continuous Michael Boston, MD 10 mL/hr at 04/30/20 0943 New Bag at 04/30/20 0943   [MAR Hold] metoprolol succinate (TOPROL-XL) 24 hr tablet  50 mg  50 mg Oral Q breakfast Darliss Cheney, MD   50 mg at 04/30/20 0855   [MAR Hold] omega-3 acid ethyl esters (LOVAZA) capsule 1 g  1 g Oral Daily Pahwani, Einar Grad, MD   1 g at 04/29/20 1220   [MAR Hold] ondansetron (ZOFRAN) tablet 4 mg  4 mg Oral Q6H PRN Darliss Cheney, MD       Or   Doug Sou Hold] ondansetron (ZOFRAN) injection 4 mg  4 mg Intravenous Q6H PRN Darliss Cheney, MD       Doug Sou Hold] pantoprazole (PROTONIX) EC tablet 40 mg  40 mg Oral BID Darliss Cheney, MD   40 mg at 04/29/20 2151   [MAR Hold] piperacillin-tazobactam (ZOSYN) IVPB 3.375 g  3.375 g Intravenous Q8H Jennette Kettle M, DO 12.5 mL/hr at 04/30/20 0306 3.375 g at 04/30/20 0306   [MAR Hold] saccharomyces boulardii (FLORASTOR) capsule 250 mg  250 mg Oral BID Meuth, Brooke A, PA-C   250 mg at 04/29/20 2151     Allergies  Allergen Reactions   Hctz [Hydrochlorothiazide] Rash    Per derm rec stay off HCTZ (2018)    BP 124/82 (BP Location: Right Arm)   Pulse 64   Temp 97.8 F (36.6 C)   Resp 18   Ht 5' 10"  (1.778 m)   Wt 104 kg   SpO2 99%   BMI 32.90 kg/m   Labs: Results for orders placed or performed during the hospital encounter of 04/29/20 (from the past 48 hour(s))  Urinalysis, Routine w reflex microscopic     Status: None   Collection Time: 04/29/20  1:00 AM  Result Value Ref Range   Color, Urine YELLOW YELLOW   APPearance CLEAR CLEAR   Specific Gravity, Urine 1.020 1.005 - 1.030   pH 6.0 5.0 - 8.0   Glucose, UA NEGATIVE NEGATIVE mg/dL   Hgb urine dipstick NEGATIVE NEGATIVE   Bilirubin Urine NEGATIVE NEGATIVE   Ketones, ur NEGATIVE NEGATIVE mg/dL   Protein, ur NEGATIVE NEGATIVE mg/dL   Nitrite NEGATIVE NEGATIVE   Leukocytes,Ua NEGATIVE NEGATIVE    Comment: Microscopic not done on urines with negative protein, blood, leukocytes, nitrite, or glucose < 500 mg/dL. Performed at [facility], [address] [place], [place] [address]   Troponin I (High Sensitivity)     Status: None   Collection Time:  04/29/20  1:46 AM  Result Value Ref Range   Troponin I (High Sensitivity) 3 <18 ng/L    Comment: (NOTE) Elevated high sensitivity troponin I (hsTnI) values and significant  changes across serial measurements may suggest ACS but many other  chronic and acute conditions are known to elevate hsTnI results.  Refer to the Links section for chest pain algorithms and additional  guidance. Performed at [facility], [address] [place], [place] [address]   D-dimer, quantitative (not at [facility])     Status: None   Collection Time: 04/29/20  1:46 AM  Result Value Ref Range   D-Dimer, Quant 0.48 0.00 - 0.50 ug/mL-FEU    Comment: (NOTE) At the manufacturer cut-off of 0.50 ug/mL FEU, this assay has been documented to exclude PE with a sensitivity and negative predictive value of 97 to 99%.  At this time, this assay has not been approved by the FDA to exclude DVT/VTE. Results should be correlated  with clinical presentation. Performed at [facility], [address] [place], [place] [address]   Lactic acid, plasma     Status: None   Collection Time: 04/29/20  2:36 AM  Result Value Ref Range   Lactic Acid, Venous 1.3 0.5 - 1.9 mmol/L    Comment: Performed at [facility], [address] [place], [place] [address]  SARS Coronavirus 2 by RT PCR (hospital order, performed in [facility] hospital lab) Nasopharyngeal Nasopharyngeal Swab     Status: None   Collection Time: 04/29/20  4:09 AM   Specimen: Nasopharyngeal Swab  Result Value Ref Range   SARS Coronavirus 2 NEGATIVE NEGATIVE    Comment: (NOTE) SARS-CoV-2 target nucleic acids are NOT DETECTED. The SARS-CoV-2 RNA is generally detectable in upper and lower respiratory specimens during the acute phase of infection. The lowest concentration of SARS-CoV-2 viral copies this assay can detect is 250 copies / mL. A negative result does not preclude SARS-CoV-2 infection and should not be used as the sole basis for  treatment or other patient management decisions.  A negative result may occur with improper specimen collection / handling, submission of specimen other than nasopharyngeal swab, presence of viral mutation(s) within the areas targeted by this assay, and inadequate number of viral copies (<250 copies / mL). A negative result must be combined with clinical observations, patient history, and epidemiological information. Fact Sheet for Patients:   StrictlyIdeas.no Fact Sheet for Healthcare Providers: BankingDealers.co.za This test is not yet approved or cleared  by the [place] FDA and has been authorized for detection and/or diagnosis of SARS-CoV-2 by FDA under an Emergency Use Authorization (EUA).  This EUA will remain in effect (meaning this test can be used) for the duration of the COVID-19 declaration under Section 564(b)(1) of the Act, 21 U.S.C. section 360bbb-3(b)(1), unless the authorization is terminated or revoked sooner. Performed at [facility], [address] [place], [place] [address]   Comprehensive metabolic panel     Status: Abnormal   Collection Time: 04/30/20  3:05 AM  Result Value Ref Range   Sodium 138 135 - 145 mmol/L   Potassium 4.0 3.5 - 5.1 mmol/L   Chloride 105 98 - 111 mmol/L   CO2 25 22 - 32 mmol/L   Glucose, Bld 93 70 - 99 mg/dL    Comment: Glucose reference range applies only to samples taken after fasting for at least 8 hours.   BUN 7 6 - 20 mg/dL   Creatinine, Ser 0.90 0.61 - 1.24 mg/dL   Calcium 8.9 8.9 - 10.3 mg/dL   Total Protein 6.6 6.5 - 8.1 g/dL   Albumin 3.7 3.5 - 5.0 g/dL   AST 18 15 - 41 U/L   ALT 26 0 - 44 U/L   Alkaline Phosphatase 53 38 - 126 U/L   Total Bilirubin 1.3 (H) 0.3 - 1.2 mg/dL   GFR calc non Af Amer >60 >60 mL/min   GFR calc Af Amer >60 >60 mL/min   Anion gap 8 5 - 15    Comment: Performed at [facility], [place] [address], [place], [place]  [address]  CBC     Status: None   Collection Time: 04/30/20  3:05 AM  Result Value Ref Range   WBC 9.5 4.0 - 10.5 K/uL   RBC 4.76 4.22 - 5.81 MIL/uL   Hemoglobin 14.3 13.0 - 17.0 g/dL   HCT 42.5 39.0 - 52.0 %  MCV 89.3 80.0 - 100.0 fL   MCH 30.0 26.0 - 34.0 pg   MCHC 33.6 30.0 - 36.0 g/dL   RDW 12.3 11.5 - 15.5 %   Platelets 355 150 - 400 K/uL   nRBC 0.0 0.0 - 0.2 %    Comment: Performed at [facility], [place] [address] [place], [place] [address]  Surgical pcr screen     Status: None   Collection Time: 04/30/20  6:12 AM   Specimen: Nasal Mucosa; Nasal Swab  Result Value Ref Range   MRSA, PCR NEGATIVE NEGATIVE   Staphylococcus aureus NEGATIVE NEGATIVE    Comment: (NOTE) The Xpert SA Assay (FDA approved for NASAL specimens in patients 28 years of age and older), is one component of a comprehensive surveillance program. It is not intended to diagnose infection nor to guide or monitor treatment. Performed at [facility], [place] [address] [place], [place] [address]     Imaging / Studies: CT ABDOMEN PELVIS W CONTRAST  Result Date: 04/29/2020 CLINICAL DATA:  Abdominal pain, diverticulitis EXAM: CT ABDOMEN AND PELVIS WITH CONTRAST TECHNIQUE: Multidetector CT imaging of the abdomen and pelvis was performed using the standard protocol following bolus administration of intravenous contrast. CONTRAST:  124m OMNIPAQUE IOHEXOL 300 MG/ML  SOLN COMPARISON:  04/13/2020 FINDINGS: Lower chest: No acute pleural or parenchymal lung disease Hepatobiliary: Diffuse hepatic steatosis. No focal abnormality. The gallbladder is unremarkable. Pancreas: Unremarkable. No pancreatic ductal dilatation or surrounding inflammatory changes. Spleen: Normal in size without focal abnormality. Adrenals/Urinary Tract: Adrenal glands are unremarkable. Kidneys are normal, without renal calculi, focal lesion, or hydronephrosis. Bladder is unremarkable. Stomach/Bowel: Wall thickening and  pericolonic fat stranding within the sigmoid colon again noted unchanged. There is a small gas/fluid collection in the left lower quadrant measuring 3.0 x 2.7 cm consistent with focal contained perforation. No evidence of peripheral enhancement or abscess at this time. No bowel obstruction or ileus. Normal appendix right lower quadrant. Vascular/Lymphatic: Aortic atherosclerosis. No enlarged abdominal or pelvic lymph nodes. Reproductive: Prostate is unremarkable. Other: No abdominal wall hernia or abnormality. No abdominopelvic ascites. Musculoskeletal: No acute or destructive bony lesions. Reconstructed images demonstrate no additional findings. IMPRESSION: 1. Continued findings of acute sigmoid diverticulitis. There is continued focal contained perforation, with small gas/fluid collection as above. No evidence of well-formed abscess at this time. 2. Hepatic steatosis. 3.  Aortic Atherosclerosis (ICD10-I70.0). Electronically Signed   By: MRanda NgoM.D.   On: 04/29/2020 02:23   DG Abd 2 Views  Result Date: 04/15/2020 CLINICAL DATA:  Abdominal pain after bowel movement today. Recent diagnosis of diverticulitis with micro perforation. EXAM: ABDOMEN - 2 VIEW COMPARISON:  CT 04/13/2020 FINDINGS: No free air under the hemidiaphragms. Small foci of extraluminal gas adjacent to the sigmoid colon on CT are not well demonstrated radiographically. Air-filled small bowel in the central abdomen that is prominent with scattered air-fluid levels. Mild gaseous distension of ascending, transverse, and proximal descending colon. Stable osseous structures. IMPRESSION: 1. No free air under the hemidiaphragms. Extraluminal air adjacent to sigmoid colon on recent CT is not well demonstrated radiographically. 2. Scattered air-fluid levels within prominent small bowel likely reactive ileus. Mild gaseous distension of transverse colon. Electronically Signed   By: MKeith RakeM.D.   On: 04/15/2020 20:30   CT Renal Stone  Study  Result Date: 04/13/2020 CLINICAL DATA:  Abdominal pain unspecified. EXAM: CT ABDOMEN AND PELVIS WITHOUT CONTRAST TECHNIQUE: Multidetector CT imaging of the abdomen and pelvis was performed following the standard  protocol without IV contrast. COMPARISON:  09/06/2019 FINDINGS: Lower chest: Incidental imaging of the lung bases is unremarkable. Hepatobiliary: Moderate to severe hepatic steatosis. No focal lesion. Lobular hepatic contours. No pericholecystic stranding. No biliary ductal dilation. Pancreas: Pancreas is normal. No signs ductal dilation or inflammation. Spleen: Spleen normal size without focal lesion. Adrenals/Urinary Tract: Adrenal glands are normal. Renal contours are smooth. No hydronephrosis. No nephrolithiasis. Urinary bladder is normal. Stomach/Bowel: Small bowel with normal appearance, stomach with normal appearance. Appendix is normal. Focal perforation, associated with small diverticulum in the mid sigmoid colon in the LEFT lower quadrant. Associated colonic thickening. Minimal diverticulosis elsewhere. Locules of extraluminal gas track into the LEFT retroperitoneum and sigmoid mesentery. There is fascial thickening along the LEFT hemipelvis. No abscess. No gas in the peritoneum proper. No omental stranding. Vascular/Lymphatic: Calcified atheromatous plaque minimal in the abdominal aorta. No evidence of aneurysmal dilation. No adenopathy. No sign of pelvic lymphadenopathy. Prostate with calcifications. Reproductive: As above Other: Moderate fat containing inguinal hernias. Small fat containing umbilical hernia. Musculoskeletal: No acute musculoskeletal process. IMPRESSION: 1. Focal perforation, associated with small diverticulum in the mid sigmoid colon with locules of extraluminal gas track into the LEFT retroperitoneum and sigmoid mesentery. No abscess. No generalized peritoneal stranding at this time. 2. Moderate to severe hepatic steatosis. Lobular hepatic contours, correlate with any  clinical or laboratory evidence of liver disease. 3. Moderate fat containing inguinal hernias and small fat containing umbilical hernia. 4. Aortic atherosclerosis. Critical Value/emergent results were called by telephone at the time of interpretation on 04/13/2020 at 11:05 am to provider [facility] , who verbally acknowledged these results. Aortic Atherosclerosis (ICD10-I70.0). Electronically Signed   By: Zetta Bills M.D.   On: 04/13/2020 11:06   US Abdomen Limited RUQ  Result Date: 04/29/2020 CLINICAL DATA:  Right upper quadrant pain EXAM: ULTRASOUND ABDOMEN LIMITED RIGHT UPPER QUADRANT COMPARISON:  04/29/2020 FINDINGS: Gallbladder: No gallstones identified. Gallbladder sludge and pericholecystic fluid identified. The gallbladder wall appears thickened measuring up to 4 mm. Common bile duct: Diameter: 4.8 mm Liver: Increased echogenicity. No focal abnormality. Portal vein is patent on color Doppler imaging with normal direction of blood flow towards the liver. Other: None. IMPRESSION: 1. Mild gallbladder wall thickening, pericholecystic fluid and gallbladder sludge. Cannot rule out acute acalculous cholecystitis. 2. Echogenic liver compatible with hepatic steatosis. Electronically Signed   By: Kerby Moors M.D.   On: 04/29/2020 11:51     .Adin Hector, M.D., F.A.C.S. Gastrointestinal and Minimally Invasive Surgery [facility] [address], [place], [place] [address] [phone] Main / Paging  04/30/2020 9:58 AM    Adin Hector

## 2020-04-30 NOTE — Anesthesia Postprocedure Evaluation (Signed)
Anesthesia Post Note  Patient: Jordan Buchanan  Procedure(s) Performed: LAPAROSCOPIC CHOLECYSTECTOMY WITH INTRAOPERATIVE CHOLANGIOGRAM, BILATERAL TAP BLOCK (N/A Abdomen) LAPAROSCOPY DIAGNOSTIC WITH WASHOUT AND DRAINAGE PLACEMENT, DRAINAGE OF DIVERTICULITIS ABSCESS (N/A Abdomen)     Patient location during evaluation: PACU Anesthesia Type: General Level of consciousness: awake and alert Pain management: pain level controlled Vital Signs Assessment: post-procedure vital signs reviewed and stable Respiratory status: spontaneous breathing, nonlabored ventilation, respiratory function stable and patient connected to nasal cannula oxygen Cardiovascular status: blood pressure returned to baseline and stable Postop Assessment: no apparent nausea or vomiting Anesthetic complications: no    Last Vitals:  Vitals:   04/30/20 1402 04/30/20 1509  BP: 133/81 124/87  Pulse: 73 63  Resp: 16 16  Temp: 36.9 C (!) 36.4 C  SpO2: 95% 91%    Last Pain:  Vitals:   04/30/20 1509  TempSrc: Oral  PainSc:                  [name]

## 2020-04-30 NOTE — Op Note (Signed)
04/30/2020  PATIENT:  Jordan Buchanan  49 y.o. male  [name], [name], [name], [name], [name], MD as Consulting Physician (General Surgery)  PRE-OPERATIVE DIAGNOSIS:    Acute on Chronic Calculus Cholecystitis  Sigmoid diverticulitis with abscess  POST-OPERATIVE DIAGNOSIS:   Acute on Chronic Calculus Cholecystitis Sigmoid diverticulitis with abscess Liver: Fatty steatohepatitis  PROCEDURE:  Laparoscopic cholecystectomy with intraoperative cholangiogram  Sigmoid colon mobilization with drainage of abscess, washout, and drain placement  SURGEON:  Adin Hector, MD, FACS.  ASSISTANT: Fran Lowes, PA-S, [employer]  ANESTHESIA:    General with endotracheal intubation Local anesthetic as a field block  Nerve block provided with liposomal bupivacaine (Experel) mixed with 0.25% bupivacaine as a Bilateral TAP block x 29mL each side at the level of the transverse abdominis & preperitoneal spaces along the flank at the anterior axillary line, from subcostal ridge to iliac crest under laparoscopic guidance    EBL:  (See Anesthesia Intraoperative Record) Total I/O In: 20 [I.V.:20] Out: 150 [Urine:100; Blood:50]   Delay start of Pharmacological VTE agent (>24hrs) due to surgical blood loss or risk of bleeding:  no  DRAINS: 19 Fr Blake closed bulb drain in the right paramedian port site goes along left paracolic gutter over abscess cavity and down in pelvis.   SPECIMEN: Gallbladder    DISPOSITION OF SPECIMEN:  PATHOLOGY  COUNTS:  YES  PLAN OF CARE: Admit to inpatient   PATIENT DISPOSITION:  PACU - hemodynamically stable.  INDICATION: Pleasant patient with diverticulitis with focal perforation.  Initially improved on antibiotics.  Then had worsening abdominal pain.  Seem more epigastric.  Almost like biliary  colic.  Ultrasound suspicious for cholecystitis.  CT scan notes larger gas pocket with some possible suspicious for worsening abscess.  I recommended laparoscopic cholecystectomy.  Because he is not in particular shock or recommended laparoscopic drainage of abscess since no great window for percutaneous drainage.  Washout with drain placement.  Allow the diverticulitis to get under better control for probable 1 stage operation and minimize Hartman resection with end colostomy and rectal stump this admission.  The anatomy & physiology of hepatobiliary & pancreatic function was discussed.  The pathophysiology of gallbladder dysfunction was discussed.  Natural history risks without surgery was discussed.   I feel the risks of no intervention will lead to serious problems that outweigh the operative risks; therefore, I recommended cholecystectomy to remove the pathology.  I explained laparoscopic techniques with possible need for an open approach.  Probable cholangiogram to evaluate the bilary tract was explained as well.    Pathophysiology of diverticulitis with abscess was discussed.  Admission made for laparoscopic mobilization and aspiration of abscess with probable drain placement.  Risks such as bleeding, infection, abscess, leak, injury to other organs, need for further treatment, heart attack, death, and other risks were discussed.  Risk of need for colectomy/colostomy noted as well.  I noted a good likelihood this will help address the problem.  Possibility that this will not correct all abdominal symptoms was explained.  Goals of post-operative recovery were discussed as well.  We will work to minimize complications.  An educational handout further explaining the pathology and treatment options was given as well.  Questions were answered.  The patient expresses understanding & wishes to proceed with surgery.  OR FINDINGS: Edematous inflamed gallbladder with some ischemia suspicious with acute on  chronic cholecystitis.  Cholangiogram showing classic biliary anatomy without any obstruction or choledocholithiasis.  No leak.  Fatty change in liver with probable steatohepatitis  Thickened sigmoid colon with phlegmon & abscess in mesentery between retroperitoneum/psoas and posterior sigmoid mesentery.  Mainly gas in abscess pocket.  Some purulence.  Able to aspirate and break in cavity up with washout.  Drain placed as noted above.  No evidence of any feculent peritonitis at this time.  CASE DATA:  Type of patient?: LDOW CASE (Surgical Hospitalist [facility] Inpatient)  Status of Case? URGENT Add On  Infection Present At Time Of Surgery (PATOS)?  ABSCESS, PHLEGMON   DESCRIPTION:   Informed consent was confirmed.  The patient underwent general anaesthesia without difficulty.  The patient was positioned appropriately.  VTE prevention in place.  The patient's abdomen was clipped, prepped, & draped in a sterile fashion.  Surgical timeout confirmed our plan.  Peritoneal entry with a laparoscopic port was obtained using optical entry technique in the right upper abdomen as the patient was positioned in reverse Trendelenburg.  Entry was clean.  I induced carbon dioxide insufflation.  Camera inspection revealed no injury.  Extra ports were carefully placed under direct laparoscopic visualization.  I turned attention to the right upper quadrant.  Gallbladder was edematous with inflammation and some ischemia.  Consistent with acute on chronic cholecystitis.  I aspirated the gallbladder with a needle on the laparoscopic suction.  This help decompress the gallbladder.  Still rather thickened with this the gallbladder fundus was elevated cephalad. I used hook cautery to free the peritoneal coverings between the gallbladder and the liver on the posteriolateral and anteriomedial walls.   I used careful blunt and hook dissection to help get a good critical view of the cystic artery and cystic duct. I did further  dissection to free 50%of the gallbladder off the liver bed to get a good critical view of the infundibulum and cystic duct.  I dissected out the cystic artery; and, after getting a good 360 view, ligated the anterior & posterior branches of the cystic artery close on the infundibulum using clips and hook cautery on the gallbladder side..  I skeletonized the cystic duct.  I placed a clip on the infundibulum. I did a partial cystic duct-otomy and ensured patency. I placed a 5 [place] cholangiocatheter through a puncture site at the right subcostal ridge of the abdominal wall and directed it into the cystic duct.  We ran a cholangiogram with dilute radio-opaque contrast and continuous fluoroscopy. Contrast flowed from a side branch consistent with cystic duct cannulization. Contrast flowed up the common hepatic duct into the right and left intrahepatic chains out to secondary radicals. Contrast flowed down the common bile duct easily across the normal ampulla into the duodenum.  This was consistent with a normal cholangiogram.  I removed the cholangiocatheter. I placed clips on the cystic duct x4.  I completed cystic duct transection. I freed the gallbladder from its remaining attachments to the liver. I ensured hemostasis on the gallbladder fossa of the liver and elsewhere. I inspected the rest of the abdomen & detected no injury nor bleeding elsewhere. Placed the gallbladder inside and ecosac bag.  I removed the gallbladder inside the sac out the epigastric port.  Next we turned attention to the sigmoid diverticulitis.  Patient placed in Trendelenburg positioning.  Small bowel and great omentum reflected to expose the sigmoid colon.  Was able to mobilize the left colon and lateral medial fashion starting at the uninflamed descending  colon and coming more distally.  Found an inflamed plane between the rectosigmoid colon and the retroperitoneum and mobilized it over.  Inflamed phlegmonous area was able to probe  into the sigmoid mesentery and break up some inflammation and purulence consistent with a abscess cavity.  Did irrigation over a liter to help wash the abscess cavity out.  Aspirated.  Hemostasis good.  Another irrigation of antibiotic solution (clindamycin/gentamicin).  Placed a drain on the right upper quadrant to go running along the left paracolic gutter and retroperitoneum along over the psoas with the tip down the pelvis.  Allowed the sigmoid colon to roll back over it.  I closed the subxiphoid fascia transversely using 0 Vicryl interrupted stitches under laparoscopic visualization.  Brought the greater omentum down allowed it to come down the pelvis to cover over the sigmoid colon and drain as well.  Evacuated carbon dioxide and remove the ports.  I closed the skin using 4-0 monocryl stitch.  Sterile dressings were applied. The patient was extubated & arrived in the PACU in stable condition..  I had discussed postoperative care with the patient in the holding area. I made an attempt to locate family to discuss patient's status and recommendations.  No one is available at this time.  I will try again later   Adin Hector, M.D., F.A.C.S. Gastrointestinal and Minimally Invasive Surgery [facility] [address] [place], [place] [address] [phone] Main / Paging  04/30/2020 12:03 PM

## 2020-04-30 NOTE — Telephone Encounter (Signed)
Patient's wife called to make sure Dr.G is aware patient is going in for emergency surgery at [facility].

## 2020-04-30 NOTE — Anesthesia Procedure Notes (Signed)
Procedure Name: Intubation Date/Time: 04/30/2020 10:17 AM Performed by: Sharlette Dense, CRNA Patient Re-evaluated:Patient Re-evaluated prior to induction Oxygen Delivery Method: Circle system utilized Preoxygenation: Pre-oxygenation with 100% oxygen Induction Type: IV induction Ventilation: Mask ventilation without difficulty and Oral airway inserted - appropriate to patient size Laryngoscope Size: Miller and 3 Grade View: Grade II Tube type: Oral Tube size: 8.0 mm Number of attempts: 1 Airway Equipment and Method: Stylet Placement Confirmation: ETT inserted through vocal cords under direct vision,  positive ETCO2 and breath sounds checked- equal and bilateral Secured at: 23 cm Tube secured with: Tape Dental Injury: Teeth and Oropharynx as per pre-operative assessment

## 2020-04-30 NOTE — Transfer of Care (Signed)
Immediate Anesthesia Transfer of Care Note  Patient: Jordan Buchanan  Procedure(s) Performed: LAPAROSCOPIC CHOLECYSTECTOMY WITH INTRAOPERATIVE CHOLANGIOGRAM, BILATERAL TAP BLOCK (N/A Abdomen) LAPAROSCOPY DIAGNOSTIC WITH WASHOUT AND DRAINAGE PLACEMENT, DRAINAGE OF DIVERTICULITIS ABSCESS (N/A Abdomen)  Patient Location: PACU  Anesthesia Type:General  Level of Consciousness: drowsy  Airway & Oxygen Therapy: Patient Spontanous Breathing and Patient connected to face mask oxygen  Post-op Assessment: Report given to RN and Post -op Vital signs reviewed and stable  Post vital signs: Reviewed and stable  Last Vitals:  Vitals Value Taken Time  BP 146/108 04/30/20 1211  Temp    Pulse 78 04/30/20 1212  Resp 13 04/30/20 1212  SpO2 98 % 04/30/20 1212  Vitals shown include unvalidated device data.  Last Pain:  Vitals:   04/30/20 0944  TempSrc:   PainSc: 0-No pain      Patients Stated Pain Goal: 2 ([id number])  Complications: No apparent anesthesia complications

## 2020-04-30 NOTE — Telephone Encounter (Signed)
Spoke w/ patient and wife.

## 2020-04-30 NOTE — Progress Notes (Signed)
I discussed operative findings, updated the patient's status, discussed probable steps to recovery, and gave postoperative recommendations to the patient's family.  Recommendations were made.  Questions were answered.  They expressed understanding & appreciation.

## 2020-04-30 NOTE — Progress Notes (Addendum)
PROGRESS NOTE    Jordan Buchanan  [id number] DOB: 04/02/1971 DOA: 04/29/2020 PCP: Ria Bush, MD     Brief Narrative:   49 y.o. WM PMHx Migraine, essential HTN, obesity, GERD, nephrolithiasis,  prior abdominal surgeries including umbilical hernia repair as well as recent hospitalization from 04/13/2020 through 04/19/2020 for acute diverticulitis with contained perforation where he received 7 days of IV antibiotics and was discharged on oral Augmentin for 7 days which he completed just 2 days ago.    According to patient, he was doing fine up until 10 PM last night when he started having abdominal pain.  Per him, his abdominal pain last time was in the lower abdomen but this time it was in the middle and upper abdomen.  It was 10/10, nonradiating, crampy with no aggravating or relieving factor.  It was associated with chills, sweating and some nausea but no fever or vomiting or any diarrhea.  He went to [facility].  Currently he has no abdominal pain or any other symptoms.  ED Course: Upon arrival to ED, he was hemodynamically stable with slightly diastolic elevated blood pressure.  Repeat CT abdomen and pelvis was done which once again showed diverticulitis with contained perforation.  Had mild leukocytosis.  Received Zosyn over there and was transferred to [facility] for admission under hospitalist service.   Subjective: Patient in surgery all day.  Attempted on 2 occasions to see patient   Assessment & Plan:   Active Problems:   Hypertension   GERD (gastroesophageal reflux disease)   Acute diverticulitis   Hypertension   GERD (gastroesophageal reflux disease)   Acute diverticulitis   Recurrent acute diverticulitis with contained perforation/cholecystitis -Per [facility] note 5/1 plan is for laparoscopic cholecystectomy, diagnostic laparoscopy with wash out and drain placement tomorrow  Essential HTN -Medication on hold patient surgery  GERD:.   DVT prophylaxis: Lovenox  code  Status: Full Family Communication:  Disposition Plan:  Status is: Inpatient  Dispo: The patient is from: Home              Anticipated d/c is to:??              Anticipated d/c date is: Per surgery              Patient currently unstable         Consultants:  [facility]    Procedures/Significant Events:   5/1 plan is for laparoscopic cholecystectomy, diagnostic laparoscopy with wash out and drain placement tomorrow   I have personally reviewed and interpreted all radiology studies and my findings are as above.  VENTILATOR SETTINGS:    Cultures   Antimicrobials: Anti-infectives (From admission, onward)   Start     Dose/Rate Stop   04/30/20 1131  clindamycin (CLEOCIN) 900 mg, gentamicin (GARAMYCIN) 240 mg in sodium chloride 0.9 % 1,000 mL for intraperitoneal lavage  Status:  Discontinued      04/30/20 1405   04/30/20 1100  piperacillin-tazobactam (ZOSYN) IVPB 3.375 g     3.375 g 100 mL/hr over 30 Minutes 04/30/20 1130   04/30/20 1015  clindamycin (CLEOCIN) 900 mg, gentamicin (GARAMYCIN) 240 mg in sodium chloride 0.9 % 1,000 mL for intraperitoneal lavage  Status:  Discontinued    Note to Pharmacy: Have in the  [facility] room for final irrigation in bowel surgery case to minimize risk of abscess/infection Pharmacy may adjust dosing strength, schedule, rate of infusion, etc as needed to optimize therapy    04/30/20 1405   04/30/20 1000  sodium chloride  0.9 % with cefoTEtan (CEFOTAN) ADS Med    Note to Pharmacy: Danley Danker   : cabinet override    04/30/20 1028   04/30/20 1000  piperacillin-tazobactam (ZOSYN) 3.375 GM/50ML IVPB    Note to Pharmacy: Danley Danker   : cabinet override    04/30/20 1101   04/29/20 1400  piperacillin-tazobactam (ZOSYN) IVPB 3.375 g  Status:  Discontinued     3.375 g 100 mL/hr over 30 Minutes 04/29/20 1124   04/29/20 1000  piperacillin-tazobactam (ZOSYN) IVPB 3.375 g     3.375 g 12.5 mL/hr over 240 Minutes     04/29/20 0330   piperacillin-tazobactam (ZOSYN) IVPB 3.375 g     3.375 g 100 mL/hr over 30 Minutes 04/29/20 0423       Devices    LINES / TUBES:      Continuous Infusions: . sodium chloride 75 mL/hr at 04/30/20 0158  . piperacillin-tazobactam (ZOSYN)  IV 3.375 g (04/30/20 0306)     Objective: Vitals:   04/29/20 1312 04/29/20 2146 04/30/20 0203 04/30/20 0620  BP: 127/90 129/88 (!) 130/92 122/80  Pulse: 75 63 61 69  Resp: 16 17 17 17   Temp: 97.8 F (36.6 C) 97.8 F (36.6 C) 98 F (36.7 C) 97.8 F (36.6 C)  TempSrc: Oral     SpO2: 97% 97% 93% 93%  Weight:      Height:        Intake/Output Summary (Last 24 hours) at 04/30/2020 0730 Last data filed at 04/30/2020 [id number] Gross per 24 hour  Intake 1706.19 ml  Output --  Net 1706.19 ml   Filed Weights   04/29/20 0019  Weight: 104.3 kg    Examination:  Patient in surgery all day.  Attempted on 2 occasions to see patient .     Data Reviewed: Care during the described time interval was provided by me .  I have reviewed this patient's available data, including medical history, events of note, physical examination, and all test results as part of my evaluation.  CBC: Recent Labs  Lab 04/28/20 2335 04/30/20 0305  WBC 10.8* 9.5  HGB 15.7 14.3  HCT 44.9 42.5  MCV 88.0 89.3  PLT 414* [id number]   Basic Metabolic Panel: Recent Labs  Lab 04/28/20 2335 04/30/20 0305  NA 143 138  K 4.0 4.0  CL 103 105  CO2 28 25  GLUCOSE 119* 93  BUN 9 7  CREATININE 1.02 0.90  CALCIUM 9.6 8.9   GFR: Estimated Creatinine Clearance: 124 mL/min (by C-G formula based on SCr of 0.9 mg/dL). Liver Function Tests: Recent Labs  Lab 04/28/20 2335 04/30/20 0305  AST 23 18  ALT 29 26  ALKPHOS 66 53  BILITOT 0.7 1.3*  PROT 7.7 6.6  ALBUMIN 4.1 3.7   Recent Labs  Lab 04/28/20 2335  LIPASE 45   No results for input(s): AMMONIA in the last 168 hours. Coagulation Profile: No results for input(s): INR, PROTIME in the last 168 hours. Cardiac  Enzymes: No results for input(s): CKTOTAL, CKMB, CKMBINDEX, TROPONINI in the last 168 hours. BNP (last 3 results) No results for input(s): PROBNP in the last 8760 hours. HbA1C: No results for input(s): HGBA1C in the last 72 hours. CBG: No results for input(s): GLUCAP in the last 168 hours. Lipid Profile: No results for input(s): CHOL, HDL, LDLCALC, TRIG, CHOLHDL, LDLDIRECT in the last 72 hours. Thyroid Function Tests: No results for input(s): TSH, T4TOTAL, FREET4, T3FREE, THYROIDAB in the last 72 hours. Anemia Panel: No results  for input(s): VITAMINB12, FOLATE, FERRITIN, TIBC, IRON, RETICCTPCT in the last 72 hours. Sepsis Labs: Recent Labs  Lab 04/29/20 0236  LATICACIDVEN 1.3    Recent Results (from the past 240 hour(s))  SARS Coronavirus 2 by RT PCR (hospital order, performed in [facility] hospital lab) Nasopharyngeal Nasopharyngeal Swab     Status: None   Collection Time: 04/29/20  4:09 AM   Specimen: Nasopharyngeal Swab  Result Value Ref Range Status   SARS Coronavirus 2 NEGATIVE NEGATIVE Final    Comment: (NOTE) SARS-CoV-2 target nucleic acids are NOT DETECTED. The SARS-CoV-2 RNA is generally detectable in upper and lower respiratory specimens during the acute phase of infection. The lowest concentration of SARS-CoV-2 viral copies this assay can detect is 250 copies / mL. A negative result does not preclude SARS-CoV-2 infection and should not be used as the sole basis for treatment or other patient management decisions.  A negative result may occur with improper specimen collection / handling, submission of specimen other than nasopharyngeal swab, presence of viral mutation(s) within the areas targeted by this assay, and inadequate number of viral copies (<250 copies / mL). A negative result must be combined with clinical observations, patient history, and epidemiological information. Fact Sheet for Patients:   StrictlyIdeas.no Fact Sheet for  Healthcare Providers: BankingDealers.co.za This test is not yet approved or cleared  by the [place] FDA and has been authorized for detection and/or diagnosis of SARS-CoV-2 by FDA under an Emergency Use Authorization (EUA).  This EUA will remain in effect (meaning this test can be used) for the duration of the COVID-19 declaration under Section 564(b)(1) of the Act, 21 U.S.C. section 360bbb-3(b)(1), unless the authorization is terminated or revoked sooner. Performed at [facility], [address], [place], [place] [address]   Surgical pcr screen     Status: None   Collection Time: 04/30/20  6:12 AM   Specimen: Nasal Mucosa; Nasal Swab  Result Value Ref Range Status   MRSA, PCR NEGATIVE NEGATIVE Final   Staphylococcus aureus NEGATIVE NEGATIVE Final    Comment: (NOTE) The Xpert SA Assay (FDA approved for NASAL specimens in patients 68 years of age and older), is one component of a comprehensive surveillance program. It is not intended to diagnose infection nor to guide or monitor treatment. Performed at [facility], [place] [address], [place], [place] [address]          Radiology Studies: CT ABDOMEN PELVIS W CONTRAST  Result Date: 04/29/2020 CLINICAL DATA:  Abdominal pain, diverticulitis EXAM: CT ABDOMEN AND PELVIS WITH CONTRAST TECHNIQUE: Multidetector CT imaging of the abdomen and pelvis was performed using the standard protocol following bolus administration of intravenous contrast. CONTRAST:  196mL OMNIPAQUE IOHEXOL 300 MG/ML  SOLN COMPARISON:  04/13/2020 FINDINGS: Lower chest: No acute pleural or parenchymal lung disease Hepatobiliary: Diffuse hepatic steatosis. No focal abnormality. The gallbladder is unremarkable. Pancreas: Unremarkable. No pancreatic ductal dilatation or surrounding inflammatory changes. Spleen: Normal in size without focal abnormality. Adrenals/Urinary Tract: Adrenal glands are unremarkable. Kidneys are  normal, without renal calculi, focal lesion, or hydronephrosis. Bladder is unremarkable. Stomach/Bowel: Wall thickening and pericolonic fat stranding within the sigmoid colon again noted unchanged. There is a small gas/fluid collection in the left lower quadrant measuring 3.0 x 2.7 cm consistent with focal contained perforation. No evidence of peripheral enhancement or abscess at this time. No bowel obstruction or ileus. Normal appendix right lower quadrant. Vascular/Lymphatic: Aortic atherosclerosis. No enlarged abdominal or pelvic lymph nodes. Reproductive: Prostate  is unremarkable. Other: No abdominal wall hernia or abnormality. No abdominopelvic ascites. Musculoskeletal: No acute or destructive bony lesions. Reconstructed images demonstrate no additional findings. IMPRESSION: 1. Continued findings of acute sigmoid diverticulitis. There is continued focal contained perforation, with small gas/fluid collection as above. No evidence of well-formed abscess at this time. 2. Hepatic steatosis. 3.  Aortic Atherosclerosis (ICD10-I70.0). Electronically Signed   By: Randa Ngo M.D.   On: 04/29/2020 02:23   US Abdomen Limited RUQ  Result Date: 04/29/2020 CLINICAL DATA:  Right upper quadrant pain EXAM: ULTRASOUND ABDOMEN LIMITED RIGHT UPPER QUADRANT COMPARISON:  04/29/2020 FINDINGS: Gallbladder: No gallstones identified. Gallbladder sludge and pericholecystic fluid identified. The gallbladder wall appears thickened measuring up to 4 mm. Common bile duct: Diameter: 4.8 mm Liver: Increased echogenicity. No focal abnormality. Portal vein is patent on color Doppler imaging with normal direction of blood flow towards the liver. Other: None. IMPRESSION: 1. Mild gallbladder wall thickening, pericholecystic fluid and gallbladder sludge. Cannot rule out acute acalculous cholecystitis. 2. Echogenic liver compatible with hepatic steatosis. Electronically Signed   By: Kerby Moors M.D.   On: 04/29/2020 11:51         Scheduled Meds: . acetaminophen  1,000 mg Oral On Call to OR  . amLODipine  10 mg Oral Daily  . enoxaparin (LOVENOX) injection  40 mg Subcutaneous Q24H  . gabapentin  300 mg Oral On Call to OR  . metoprolol succinate  50 mg Oral Q breakfast  . omega-3 acid ethyl esters  1 g Oral Daily  . pantoprazole  40 mg Oral BID  . saccharomyces boulardii  250 mg Oral BID   Continuous Infusions: . sodium chloride 75 mL/hr at 04/30/20 0158  . piperacillin-tazobactam (ZOSYN)  IV 3.375 g (04/30/20 0306)     LOS: 1 day    Time spent:40 min    Elliannah Wayment, Geraldo Docker, MD [facility] Pager [phone]  If 7PM-7AM, please contact night-coverage [email or web address] Password [email or web address] 04/30/2020, 7:30 AM

## 2020-04-30 NOTE — Anesthesia Preprocedure Evaluation (Signed)
Anesthesia Evaluation  Patient identified by MRN, date of birth, ID band Patient awake    Reviewed: Allergy & Precautions, NPO status , Patient's Chart, lab work & pertinent test results  Airway Mallampati: III  TM Distance: >3 FB Neck ROM: Full    Dental no notable dental hx.    Pulmonary asthma , sleep apnea , former smoker,    Pulmonary exam normal breath sounds clear to auscultation       Cardiovascular hypertension, Pt. on medications and Pt. on home beta blockers Normal cardiovascular exam Rhythm:Regular Rate:Normal  ECG: SR, rate 81   Neuro/Psych  Headaches, negative psych ROS   GI/Hepatic Neg liver ROS, GERD  Medicated and Controlled,Diverticulosis   Endo/Other  negative endocrine ROS  Renal/GU negative Renal ROS     Musculoskeletal negative musculoskeletal ROS (+)   Abdominal (+) + obese,   Peds  Hematology negative hematology ROS (+)   Anesthesia Other Findings ACUTE CHOLYCYSTITS ACUTE SIGMOID DIVERTICULITIS  Reproductive/Obstetrics                            Anesthesia Physical Anesthesia Plan  ASA: II  Anesthesia Plan: General   Post-op Pain Management:    Induction: Intravenous  PONV Risk Score and Plan: Ondansetron, Dexamethasone, Midazolam and Treatment may vary due to age or medical condition  Airway Management Planned: Oral ETT  Additional Equipment:   Intra-op Plan:   Post-operative Plan: Extubation in OR  Informed Consent: I have reviewed the patients History and Physical, chart, labs and discussed the procedure including the risks, benefits and alternatives for the proposed anesthesia with the patient or authorized representative who has indicated his/her understanding and acceptance.     Dental advisory given  Plan Discussed with: CRNA  Anesthesia Plan Comments:         Anesthesia Quick Evaluation

## 2020-04-30 NOTE — Progress Notes (Signed)
Pt stable in PACU  Could not reach his wife on her phone.  Per pt:  "That happens often."  I discussed operative findings, updated the patient's status, discussed probable steps to recovery, and gave postoperative recommendations to the patient and nurse.  Recommendations were made.  Questions were answered.  They expressed understanding & appreciation.  Adin Hector, MD, FACS, MASCRS Gastrointestinal and Minimally Invasive Surgery  [facility] [address] [place], [place] [address] [phone] Fax [phone] Main/Paging  CONTACT INFORMATION: Weekday (9AM-5PM) concerns: Call [facility] main office at [phone] Weeknight (5PM-9AM) or Weekend/Holiday concerns: Check [email or web address] for General Surgery [facility] coverage (Please, do not use SecureChat as it is not reliable communication to surgeons for patient care)

## 2020-04-30 NOTE — Telephone Encounter (Signed)
Called and left message for [place].  Called wife [name], no answer.  Will follow along hospital chart.

## 2020-05-01 DIAGNOSIS — E669 Obesity, unspecified: Secondary | ICD-10-CM | POA: Diagnosis not present

## 2020-05-01 DIAGNOSIS — I10 Essential (primary) hypertension: Secondary | ICD-10-CM | POA: Diagnosis not present

## 2020-05-01 DIAGNOSIS — K5720 Diverticulitis of large intestine with perforation and abscess without bleeding: Secondary | ICD-10-CM | POA: Diagnosis not present

## 2020-05-01 DIAGNOSIS — K5792 Diverticulitis of intestine, part unspecified, without perforation or abscess without bleeding: Secondary | ICD-10-CM | POA: Diagnosis not present

## 2020-05-01 LAB — CBC WITH DIFFERENTIAL/PLATELET
Abs Immature Granulocytes: 0.06 10*3/uL (ref 0.00–0.07)
Basophils Absolute: 0 10*3/uL (ref 0.0–0.1)
Basophils Relative: 0 %
Eosinophils Absolute: 0 10*3/uL (ref 0.0–0.5)
Eosinophils Relative: 0 %
HCT: 40.8 % (ref 39.0–52.0)
Hemoglobin: 13.7 g/dL (ref 13.0–17.0)
Immature Granulocytes: 0 %
Lymphocytes Relative: 8 %
Lymphs Abs: 1.1 10*3/uL (ref 0.7–4.0)
MCH: 30 pg (ref 26.0–34.0)
MCHC: 33.6 g/dL (ref 30.0–36.0)
MCV: 89.5 fL (ref 80.0–100.0)
Monocytes Absolute: 0.8 10*3/uL (ref 0.1–1.0)
Monocytes Relative: 6 %
Neutro Abs: 11.9 10*3/uL — ABNORMAL HIGH (ref 1.7–7.7)
Neutrophils Relative %: 86 %
Platelets: 380 10*3/uL (ref 150–400)
RBC: 4.56 MIL/uL (ref 4.22–5.81)
RDW: 12.1 % (ref 11.5–15.5)
WBC: 13.9 10*3/uL — ABNORMAL HIGH (ref 4.0–10.5)
nRBC: 0 % (ref 0.0–0.2)

## 2020-05-01 LAB — MAGNESIUM: Magnesium: 2 mg/dL (ref 1.7–2.4)

## 2020-05-01 LAB — COMPREHENSIVE METABOLIC PANEL
ALT: 52 U/L — ABNORMAL HIGH (ref 0–44)
AST: 43 U/L — ABNORMAL HIGH (ref 15–41)
Albumin: 3.5 g/dL (ref 3.5–5.0)
Alkaline Phosphatase: 51 U/L (ref 38–126)
Anion gap: 8 (ref 5–15)
BUN: 10 mg/dL (ref 6–20)
CO2: 23 mmol/L (ref 22–32)
Calcium: 8.6 mg/dL — ABNORMAL LOW (ref 8.9–10.3)
Chloride: 102 mmol/L (ref 98–111)
Creatinine, Ser: 0.89 mg/dL (ref 0.61–1.24)
GFR calc Af Amer: >60 mL/min (ref >60)
GFR calc non Af Amer: >60 mL/min (ref >60)
Glucose, Bld: 126 mg/dL — ABNORMAL HIGH (ref 70–99)
Potassium: 4.3 mmol/L (ref 3.5–5.1)
Sodium: 133 mmol/L — ABNORMAL LOW (ref 135–145)
Total Bilirubin: 1.1 mg/dL (ref 0.3–1.2)
Total Protein: 6.3 g/dL — ABNORMAL LOW (ref 6.5–8.1)

## 2020-05-01 LAB — PHOSPHORUS: Phosphorus: 3.8 mg/dL (ref 2.5–4.6)

## 2020-05-01 LAB — SURGICAL PATHOLOGY

## 2020-05-01 MED ORDER — OXYCODONE HCL 5 MG PO TABS
5.0000 mg | ORAL_TABLET | ORAL | Status: DC | PRN
  Administered 2020-05-01: 10 mg via ORAL
  Administered 2020-05-01 (×2): 5 mg via ORAL
  Administered 2020-05-01 – 2020-05-03 (×5): 10 mg via ORAL
  Filled 2020-05-01 (×2): qty 2
  Filled 2020-05-01 (×2): qty 1
  Filled 2020-05-01 (×5): qty 2

## 2020-05-01 MED ORDER — HYDROMORPHONE HCL 1 MG/ML IJ SOLN: 1.0000 mg | INTRAMUSCULAR | Status: DC | PRN

## 2020-05-01 MED ORDER — CHLORPROMAZINE HCL 25 MG/ML IJ SOLN
25.0000 mg | Freq: Three times a day (TID) | INTRAMUSCULAR | Status: DC | PRN
  Filled 2020-05-01: qty 1

## 2020-05-01 MED ORDER — METHOCARBAMOL 500 MG PO TABS
750.0000 mg | ORAL_TABLET | Freq: Four times a day (QID) | ORAL | Status: DC | PRN
  Administered 2020-05-01 – 2020-05-02 (×3): 750 mg via ORAL
  Filled 2020-05-01 (×4): qty 2

## 2020-05-01 NOTE — Plan of Care (Signed)
Plan of care reviewed and discussed with the patient. 

## 2020-05-01 NOTE — Progress Notes (Signed)
[facility] Progress Note  1 Day Post-Op  Subjective: CC-  Abdomen sore this morning. Main complaint is hiccups. He is passing some flatus, no BM. Denies nausea or vomiting. Tolerating clear liquids. Ambulated in the halls once last night.   Objective: Vital signs in last 24 hours: Temp:  [97.5 F (36.4 C)-98.4 F (36.9 C)] 97.5 F (36.4 C) (05/13 0510) Pulse Rate:  [57-79] 57 (05/13 0510) Resp:  [10-21] 18 (05/13 0510) BP: (107-150)/(70-94) 123/77 (05/13 0510) SpO2:  [89 %-100 %] 96 % (05/13 0510) Weight:  [104 kg] 104 kg (05/12 0944) Last BM Date: 04/28/20  Intake/Output from previous day: 05/12 0701 - 05/13 0700 In: 3297.9 [P.O.:1170; I.V.:1918.1; IV Piggyback:209.9] Out: 2095 [Urine:1275; Drains:770; Blood:50] Intake/Output this shift: No intake/output data recorded.  PE: Gen:  Alert, NAD, pleasant HEENT: EOM's intact, pupils equal and round Pulm: rate and effort normal Abd: Soft, mild distension, mild lower abdominal and epigastric TTP, +BS, lap incisions with cdi dressings in place >> new dressing applied to RLQ dressing due to bloody drainage, drain with bloody fluid in bulb  Lab Results:  Recent Labs    04/30/20 0305 05/01/20 0311  WBC 9.5 13.9*  HGB 14.3 13.7  HCT 42.5 40.8  PLT 355 380   BMET Recent Labs    04/30/20 0305 05/01/20 0311  NA 138 133*  K 4.0 4.3  CL 105 102  CO2 25 23  GLUCOSE 93 126*  BUN 7 10  CREATININE 0.90 0.89  CALCIUM 8.9 8.6*   PT/INR No results for input(s): LABPROT, INR in the last 72 hours. CMP     Component Value Date/Time   NA 133 (L) 05/01/2020 0311   K 4.3 05/01/2020 0311   CL 102 05/01/2020 0311   CO2 23 05/01/2020 0311   GLUCOSE 126 (H) 05/01/2020 0311   BUN 10 05/01/2020 0311   CREATININE 0.89 05/01/2020 0311   CALCIUM 8.6 (L) 05/01/2020 0311   PROT 6.3 (L) 05/01/2020 0311   ALBUMIN 3.5 05/01/2020 0311   AST 43 (H) 05/01/2020 0311   ALT 52 (H) 05/01/2020 0311   ALKPHOS 51 05/01/2020 0311    BILITOT 1.1 05/01/2020 0311   GFRNONAA >60 05/01/2020 0311   GFRAA >60 05/01/2020 0311   Lipase     Component Value Date/Time   LIPASE 45 04/28/2020 2335       Studies/Results: DG Cholangiogram Operative  Result Date: 04/30/2020 CLINICAL DATA:  49 year old male undergoing laparoscopic cholecystectomy. EXAM: INTRAOPERATIVE CHOLANGIOGRAM TECHNIQUE: Cholangiographic images from the C-arm fluoroscopic device were submitted for interpretation post-operatively. Please see the procedural report for the amount of contrast and the fluoroscopy time utilized. COMPARISON:  None. FINDINGS: A single cine clip demonstrates cannulation of the cystic duct remanent and intraoperative cholangiogram. There is no evidence of biliary ductal dilatation, stenosis, stricture or choledocholithiasis. Contrast material refluxes into the distal aspect of the main pancreatic duct. Contrast passes freely through the ampulla and into the duodenum. IMPRESSION: Negative intraoperative cholangiogram. Electronically Signed   By: Jacqulynn Cadet M.D.   On: 04/30/2020 17:05   US Abdomen Limited RUQ  Result Date: 04/29/2020 CLINICAL DATA:  Right upper quadrant pain EXAM: ULTRASOUND ABDOMEN LIMITED RIGHT UPPER QUADRANT COMPARISON:  04/29/2020 FINDINGS: Gallbladder: No gallstones identified. Gallbladder sludge and pericholecystic fluid identified. The gallbladder wall appears thickened measuring up to 4 mm. Common bile duct: Diameter: 4.8 mm Liver: Increased echogenicity. No focal abnormality. Portal vein is patent on color Doppler imaging with normal direction of blood flow towards the  liver. Other: None. IMPRESSION: 1. Mild gallbladder wall thickening, pericholecystic fluid and gallbladder sludge. Cannot rule out acute acalculous cholecystitis. 2. Echogenic liver compatible with hepatic steatosis. Electronically Signed   By: Kerby Moors M.D.   On: 04/29/2020 11:51    Anti-infectives: Anti-infectives (From admission,  onward)   Start     Dose/Rate Route Frequency Ordered Stop   04/30/20 1131  clindamycin (CLEOCIN) 900 mg, gentamicin (GARAMYCIN) 240 mg in sodium chloride 0.9 % 1,000 mL for intraperitoneal lavage  Status:  Discontinued       As needed 04/30/20 1132 04/30/20 1405   04/30/20 1100  piperacillin-tazobactam (ZOSYN) IVPB 3.375 g     3.375 g 100 mL/hr over 30 Minutes Intravenous On call to O.R. 04/30/20 1059 04/30/20 1130   04/30/20 1015  clindamycin (CLEOCIN) 900 mg, gentamicin (GARAMYCIN) 240 mg in sodium chloride 0.9 % 1,000 mL for intraperitoneal lavage  Status:  Discontinued    Note to Pharmacy: Have in the  [place] room for final irrigation in bowel surgery case to minimize risk of abscess/infection Pharmacy may adjust dosing strength, schedule, rate of infusion, etc as needed to optimize therapy    Irrigation To Surgery 04/30/20 1008 04/30/20 1405   04/30/20 1000  sodium chloride 0.9 % with cefoTEtan (CEFOTAN) ADS Med    Note to Pharmacy: Danley Danker   : cabinet override      04/30/20 1000 04/30/20 1028   04/30/20 1000  piperacillin-tazobactam (ZOSYN) 3.375 GM/50ML IVPB    Note to Pharmacy: Danley Danker   : cabinet override      04/30/20 1000 04/30/20 1101   04/29/20 1400  piperacillin-tazobactam (ZOSYN) IVPB 3.375 g  Status:  Discontinued     3.375 g 100 mL/hr over 30 Minutes Intravenous Every 8 hours 04/29/20 1118 04/29/20 1124   04/29/20 1000  piperacillin-tazobactam (ZOSYN) IVPB 3.375 g     3.375 g 12.5 mL/hr over 240 Minutes Intravenous Every 8 hours 04/29/20 0430     04/29/20 0330  piperacillin-tazobactam (ZOSYN) IVPB 3.375 g     3.375 g 100 mL/hr over 30 Minutes Intravenous  Once 04/29/20 0327 04/29/20 0423       Assessment/Plan HTN GERD Nephrolithiasis OSA  Obesity BMI 33  Acute sigmoid diverticulitis with perforation Acute cholecystitis -S/p laparoscopic cholecystectomy with IOC, sigmoid colon mobilization with drainage of abscess, washout, and drain placement 5/12  Dr. [name] - POD#1 - plan to continue drainage and oral antibiotics (cipro/flagyl) at discharge - patient will follow up with Dr. [name] and schedule elective partial colectomy in the next several weeks  ID - zosyn 5/11>> VTE - SCDs, lovenox FEN - IVF, FLD Foley - none Follow up - Dr. [name]   Plan: Advance to full liquids. Continue IV zosyn and drain, monitor output. Thorazine PRN hiccups. Continue scheduled tylenol/ibuprofen, add oxycodone PRN moderate/severe pain and keep IV dilaudid for breakthrough pain. Mobilize. Labs in AM.   LOS: 2 days    Wellington [place], [facility] Surgery 05/01/2020, 8:27 AM Please see Amion for pager number during day hours 7:00am-4:30pm

## 2020-05-01 NOTE — Progress Notes (Signed)
PROGRESS NOTE    Jordan Buchanan  [id number] DOB: [birth year] DOA: 04/29/2020 PCP: Ria Bush, MD     Brief Narrative:   49 y.o. WM PMHx Migraine, essential HTN, obesity, GERD, nephrolithiasis,  prior abdominal surgeries including umbilical hernia repair as well as recent hospitalization from 04/13/2020 through 04/19/2020 for acute diverticulitis with contained perforation where he received 7 days of IV antibiotics and was discharged on oral Augmentin for 7 days which he completed just 2 days ago.    According to patient, he was doing fine up until 10 PM last night when he started having abdominal pain.  Per him, his abdominal pain last time was in the lower abdomen but this time it was in the middle and upper abdomen.  It was 10/10, nonradiating, crampy with no aggravating or relieving factor.  It was associated with chills, sweating and some nausea but no fever or vomiting or any diarrhea.  He went to [facility].  Currently he has no abdominal pain or any other symptoms.  ED Course: Upon arrival to ED, he was hemodynamically stable with slightly diastolic elevated blood pressure.  Repeat CT abdomen and pelvis was done which once again showed diverticulitis with contained perforation.  Had mild leukocytosis.  Received Zosyn over there and was transferred to [facility] for admission under hospitalist service.   Subjective: 5/13 A/O x4, negative CP, negative SOB, appropriate abdominal pain.   Assessment & Plan:   Principal Problem:   Diverticulitis of large intestine with perforation and abscess s/p lap drainage & washout 04/30/2020 Active Problems:   Hypertension   GERD (gastroesophageal reflux disease)   Hepatic steatosis   Obesity, [name], BMI 30.0-34.9 (see actual BMI)   Acute diverticulitis   Acute calculous cholecystitis s/p lap cholecystectomy 04/30/2020   Hypertension   GERD (gastroesophageal reflux disease)   Acute diverticulitis   Recurrent acute diverticulitis with  contained perforation/cholecystitis -Per [facility] note 5/12 plan is for laparoscopic cholecystectomy, diagnostic laparoscopy with wash out and drain placement tomorrow -5/13 1 JP drain in place serosanguineous fluid draining multiple laparoscopic incision sites covered and clean.  Essential HTN -Medication on hold patient surgery -5/13 BP still controlled we will continue holding medication.  GERD:. -Asymptomatic  DVT prophylaxis: Lovenox  code Status: Full Family Communication: 5/13 wife at bedside discussed plan of care answered all questions Disposition Plan:  Status is: Inpatient  Dispo: The patient is from: Home              Anticipated d/c is to:??              Anticipated d/c date is: Per surgery              Patient currently unstable         Consultants:  [facility]    Procedures/Significant Events:   5/12 plan is for laparoscopic cholecystectomy, diagnostic laparoscopy with wash out and drain placement tomorrow   I have personally reviewed and interpreted all radiology studies and my findings are as above.  VENTILATOR SETTINGS:    Cultures   Antimicrobials: Anti-infectives (From admission, onward)   Start     Dose/Rate Stop   04/30/20 1131  clindamycin (CLEOCIN) 900 mg, gentamicin (GARAMYCIN) 240 mg in sodium chloride 0.9 % 1,000 mL for intraperitoneal lavage  Status:  Discontinued      04/30/20 1405   04/30/20 1100  piperacillin-tazobactam (ZOSYN) IVPB 3.375 g     3.375 g 100 mL/hr over 30 Minutes 04/30/20 1130   04/30/20 1015  clindamycin (CLEOCIN) 900 mg, gentamicin (GARAMYCIN) 240 mg in sodium chloride 0.9 % 1,000 mL for intraperitoneal lavage  Status:  Discontinued    Note to Pharmacy: Have in the  OR room for final irrigation in bowel surgery case to minimize risk of abscess/infection Pharmacy may adjust dosing strength, schedule, rate of infusion, etc as needed to optimize therapy    04/30/20 1405   04/30/20 1000  sodium chloride 0.9 % with cefoTEtan  (CEFOTAN) ADS Med    Note to Pharmacy: Danley Danker   : cabinet override    04/30/20 1028   04/30/20 1000  piperacillin-tazobactam (ZOSYN) 3.375 GM/50ML IVPB    Note to Pharmacy: Danley Danker   : cabinet override    04/30/20 1101   04/29/20 1400  piperacillin-tazobactam (ZOSYN) IVPB 3.375 g  Status:  Discontinued     3.375 g 100 mL/hr over 30 Minutes 04/29/20 1124   04/29/20 1000  piperacillin-tazobactam (ZOSYN) IVPB 3.375 g     3.375 g 12.5 mL/hr over 240 Minutes     04/29/20 0330  piperacillin-tazobactam (ZOSYN) IVPB 3.375 g     3.375 g 100 mL/hr over 30 Minutes 04/29/20 0423       Devices    LINES / TUBES:      Continuous Infusions: . sodium chloride 75 mL/hr at 05/01/20 0313  . sodium chloride    . lactated ringers    . piperacillin-tazobactam (ZOSYN)  IV 3.375 g (05/01/20 0310)     Objective: Vitals:   04/30/20 1712 04/30/20 2109 05/01/20 0122 05/01/20 0510  BP: (!) 125/91 120/78 112/73 123/77  Pulse: 78 74 72 (!) 57  Resp: 16 16 16 18   Temp: (!) 97.5 F (36.4 C) 97.8 F (36.6 C) 97.8 F (36.6 C) (!) 97.5 F (36.4 C)  TempSrc: Oral Oral Oral Oral  SpO2: 92% 96% 98% 96%  Weight:      Height:        Intake/Output Summary (Last 24 hours) at 05/01/2020 0801 Last data filed at 05/01/2020 0510 Gross per 24 hour  Intake 3277.94 ml  Output 2095 ml  Net 1182.94 ml   Filed Weights   04/29/20 0019 04/30/20 0944  Weight: 104.3 kg 104 kg   Physical Exam:  General: A/O x4, no acute respiratory distress Eyes: negative scleral hemorrhage, negative anisocoria, negative icterus ENT: Negative Runny nose, negative gingival bleeding, Neck:  Negative scars, masses, torticollis, lymphadenopathy, JVD Lungs: Clear to auscultation bilaterally without wheezes or crackles Cardiovascular: Regular rate and rhythm without murmur gallop or rub normal S1 and S2 Abdomen: Positive abdominal pain, positive distended, positive soft, bowel sounds, no rebound, no ascites, no  appreciable mass, multiple laparoscopic incision sites covered and clean negative site of bleeding, 1 JP drain RLQ draining serosanguineous fluid Extremities: No significant cyanosis, clubbing, or edema bilateral lower extremities Skin: Negative rashes, lesions, ulcers Psychiatric:  Negative depression, negative anxiety, negative fatigue, negative mania  Central nervous system:  Cranial nerves II through XII intact, tongue/uvula midline, all extremities muscle strength 5/5, sensation intact throughout,negative dysarthria, negative expressive aphasia, negative receptive aphasia.   Data Reviewed: Care during the described time interval was provided by me .  I have reviewed this patient's available data, including medical history, events of note, physical examination, and all test results as part of my evaluation.  CBC: Recent Labs  Lab 04/28/20 2335 04/30/20 0305 05/01/20 0311  WBC 10.8* 9.5 13.9*  NEUTROABS  --   --  11.9*  HGB 15.7 14.3 13.7  HCT 44.9  42.5 40.8  MCV 88.0 89.3 89.5  PLT 414* 355 [id number]   Basic Metabolic Panel: Recent Labs  Lab 04/28/20 2335 04/30/20 0305 05/01/20 0311  NA 143 138 133*  K 4.0 4.0 4.3  CL 103 105 102  CO2 28 25 23   GLUCOSE 119* 93 126*  BUN 9 7 10   CREATININE 1.02 0.90 0.89  CALCIUM 9.6 8.9 8.6*  MG  --   --  2.0  PHOS  --   --  3.8   GFR: Estimated Creatinine Clearance: 125.3 mL/min (by C-G formula based on SCr of 0.89 mg/dL). Liver Function Tests: Recent Labs  Lab 04/28/20 2335 04/30/20 0305 05/01/20 0311  AST 23 18 43*  ALT 29 26 52*  ALKPHOS 66 53 51  BILITOT 0.7 1.3* 1.1  PROT 7.7 6.6 6.3*  ALBUMIN 4.1 3.7 3.5   Recent Labs  Lab 04/28/20 2335  LIPASE 45   No results for input(s): AMMONIA in the last 168 hours. Coagulation Profile: No results for input(s): INR, PROTIME in the last 168 hours. Cardiac Enzymes: No results for input(s): CKTOTAL, CKMB, CKMBINDEX, TROPONINI in the last 168 hours. BNP (last 3 results) No  results for input(s): PROBNP in the last 8760 hours. HbA1C: No results for input(s): HGBA1C in the last 72 hours. CBG: No results for input(s): GLUCAP in the last 168 hours. Lipid Profile: No results for input(s): CHOL, HDL, LDLCALC, TRIG, CHOLHDL, LDLDIRECT in the last 72 hours. Thyroid Function Tests: No results for input(s): TSH, T4TOTAL, FREET4, T3FREE, THYROIDAB in the last 72 hours. Anemia Panel: No results for input(s): VITAMINB12, FOLATE, FERRITIN, TIBC, IRON, RETICCTPCT in the last 72 hours. Sepsis Labs: Recent Labs  Lab 04/29/20 0236  LATICACIDVEN 1.3    Recent Results (from the past 240 hour(s))  SARS Coronavirus 2 by RT PCR (hospital order, performed in [facility] hospital lab) Nasopharyngeal Nasopharyngeal Swab     Status: None   Collection Time: 04/29/20  4:09 AM   Specimen: Nasopharyngeal Swab  Result Value Ref Range Status   SARS Coronavirus 2 NEGATIVE NEGATIVE Final    Comment: (NOTE) SARS-CoV-2 target nucleic acids are NOT DETECTED. The SARS-CoV-2 RNA is generally detectable in upper and lower respiratory specimens during the acute phase of infection. The lowest concentration of SARS-CoV-2 viral copies this assay can detect is 250 copies / mL. A negative result does not preclude SARS-CoV-2 infection and should not be used as the sole basis for treatment or other patient management decisions.  A negative result may occur with improper specimen collection / handling, submission of specimen other than nasopharyngeal swab, presence of viral mutation(s) within the areas targeted by this assay, and inadequate number of viral copies (<250 copies / mL). A negative result must be combined with clinical observations, patient history, and epidemiological information. Fact Sheet for Patients:   StrictlyIdeas.no Fact Sheet for Healthcare Providers: BankingDealers.co.za This test is not yet approved or cleared  by the [place] FDA and has been authorized for detection and/or diagnosis of SARS-CoV-2 by FDA under an Emergency Use Authorization (EUA).  This EUA will remain in effect (meaning this test can be used) for the duration of the COVID-19 declaration under Section 564(b)(1) of the Act, 21 U.S.C. section 360bbb-3(b)(1), unless the authorization is terminated or revoked sooner. Performed at [facility], [address], [place], [place] [address]   Surgical pcr screen     Status: None   Collection Time: 04/30/20  6:12 AM   Specimen:  Nasal Mucosa; Nasal Swab  Result Value Ref Range Status   MRSA, PCR NEGATIVE NEGATIVE Final   Staphylococcus aureus NEGATIVE NEGATIVE Final    Comment: (NOTE) The Xpert SA Assay (FDA approved for NASAL specimens in patients 76 years of age and older), is one component of a comprehensive surveillance program. It is not intended to diagnose infection nor to guide or monitor treatment. Performed at [facility], [place] [address], [place], [place] [address]          Radiology Studies: DG Cholangiogram Operative  Result Date: 04/30/2020 CLINICAL DATA:  49 year old male undergoing laparoscopic cholecystectomy. EXAM: INTRAOPERATIVE CHOLANGIOGRAM TECHNIQUE: Cholangiographic images from the C-arm fluoroscopic device were submitted for interpretation post-operatively. Please see the procedural report for the amount of contrast and the fluoroscopy time utilized. COMPARISON:  None. FINDINGS: A single cine clip demonstrates cannulation of the cystic duct remanent and intraoperative cholangiogram. There is no evidence of biliary ductal dilatation, stenosis, stricture or choledocholithiasis. Contrast material refluxes into the distal aspect of the main pancreatic duct. Contrast passes freely through the ampulla and into the duodenum. IMPRESSION: Negative intraoperative cholangiogram. Electronically Signed   By: Jacqulynn Cadet M.D.   On: 04/30/2020  17:05   US Abdomen Limited RUQ  Result Date: 04/29/2020 CLINICAL DATA:  Right upper quadrant pain EXAM: ULTRASOUND ABDOMEN LIMITED RIGHT UPPER QUADRANT COMPARISON:  04/29/2020 FINDINGS: Gallbladder: No gallstones identified. Gallbladder sludge and pericholecystic fluid identified. The gallbladder wall appears thickened measuring up to 4 mm. Common bile duct: Diameter: 4.8 mm Liver: Increased echogenicity. No focal abnormality. Portal vein is patent on color Doppler imaging with normal direction of blood flow towards the liver. Other: None. IMPRESSION: 1. Mild gallbladder wall thickening, pericholecystic fluid and gallbladder sludge. Cannot rule out acute acalculous cholecystitis. 2. Echogenic liver compatible with hepatic steatosis. Electronically Signed   By: Kerby Moors M.D.   On: 04/29/2020 11:51        Scheduled Meds: . acetaminophen  1,000 mg Oral TID  . amLODipine  10 mg Oral Daily  . enoxaparin (LOVENOX) injection  40 mg Subcutaneous Q24H  . gabapentin  300 mg Oral BID  . ibuprofen  800 mg Oral QID  . lip balm  1 application Topical BID  . metoprolol succinate  50 mg Oral Q breakfast  . omega-3 acid ethyl esters  1 g Oral Daily  . pantoprazole  40 mg Oral BID  . saccharomyces boulardii  250 mg Oral BID  . sodium chloride flush  3 mL Intravenous Q12H   Continuous Infusions: . sodium chloride 75 mL/hr at 05/01/20 0313  . sodium chloride    . lactated ringers    . piperacillin-tazobactam (ZOSYN)  IV 3.375 g (05/01/20 0310)     LOS: 2 days    Time spent:40 min    Keyon Winnick, Geraldo Docker, MD [facility] Pager [phone]  If 7PM-7AM, please contact night-coverage [email or web address] Password [email or web address] 05/01/2020, 8:01 AM

## 2020-05-02 DIAGNOSIS — K8000 Calculus of gallbladder with acute cholecystitis without obstruction: Secondary | ICD-10-CM

## 2020-05-02 LAB — CBC WITH DIFFERENTIAL/PLATELET
Abs Immature Granulocytes: 0.05 10*3/uL (ref 0.00–0.07)
Basophils Absolute: 0 10*3/uL (ref 0.0–0.1)
Basophils Relative: 0 %
Eosinophils Absolute: 0.2 10*3/uL (ref 0.0–0.5)
Eosinophils Relative: 2 %
HCT: 37.4 % — ABNORMAL LOW (ref 39.0–52.0)
Hemoglobin: 12.5 g/dL — ABNORMAL LOW (ref 13.0–17.0)
Immature Granulocytes: 0 %
Lymphocytes Relative: 22 %
Lymphs Abs: 2.6 10*3/uL (ref 0.7–4.0)
MCH: 30.3 pg (ref 26.0–34.0)
MCHC: 33.4 g/dL (ref 30.0–36.0)
MCV: 90.6 fL (ref 80.0–100.0)
Monocytes Absolute: 0.9 10*3/uL (ref 0.1–1.0)
Monocytes Relative: 8 %
Neutro Abs: 7.8 10*3/uL — ABNORMAL HIGH (ref 1.7–7.7)
Neutrophils Relative %: 68 %
Platelets: 305 10*3/uL (ref 150–400)
RBC: 4.13 MIL/uL — ABNORMAL LOW (ref 4.22–5.81)
RDW: 12.5 % (ref 11.5–15.5)
WBC: 11.6 10*3/uL — ABNORMAL HIGH (ref 4.0–10.5)
nRBC: 0 % (ref 0.0–0.2)

## 2020-05-02 LAB — COMPREHENSIVE METABOLIC PANEL
ALT: 40 U/L (ref 0–44)
AST: 24 U/L (ref 15–41)
Albumin: 3.3 g/dL — ABNORMAL LOW (ref 3.5–5.0)
Alkaline Phosphatase: 45 U/L (ref 38–126)
Anion gap: 8 (ref 5–15)
BUN: 7 mg/dL (ref 6–20)
CO2: 24 mmol/L (ref 22–32)
Calcium: 8.4 mg/dL — ABNORMAL LOW (ref 8.9–10.3)
Chloride: 105 mmol/L (ref 98–111)
Creatinine, Ser: 0.88 mg/dL (ref 0.61–1.24)
GFR calc Af Amer: >60 mL/min (ref >60)
GFR calc non Af Amer: >60 mL/min (ref >60)
Glucose, Bld: 94 mg/dL (ref 70–99)
Potassium: 4 mmol/L (ref 3.5–5.1)
Sodium: 137 mmol/L (ref 135–145)
Total Bilirubin: 0.9 mg/dL (ref 0.3–1.2)
Total Protein: 6 g/dL — ABNORMAL LOW (ref 6.5–8.1)

## 2020-05-02 LAB — PHOSPHORUS: Phosphorus: 2.8 mg/dL (ref 2.5–4.6)

## 2020-05-02 LAB — MAGNESIUM: Magnesium: 2 mg/dL (ref 1.7–2.4)

## 2020-05-02 MED ORDER — HYDROMORPHONE HCL 1 MG/ML IJ SOLN: 0.5000 mg | INTRAMUSCULAR | Status: DC | PRN

## 2020-05-02 MED ORDER — IBUPROFEN 400 MG PO TABS
800.0000 mg | ORAL_TABLET | Freq: Four times a day (QID) | ORAL | Status: DC | PRN
  Administered 2020-05-02 – 2020-05-03 (×3): 800 mg via ORAL
  Filled 2020-05-02 (×3): qty 2

## 2020-05-02 NOTE — Progress Notes (Signed)
PROGRESS NOTE    Jordan Buchanan  [id number] DOB: 02-05-1971 DOA: 04/29/2020 PCP: Ria Bush, MD     Brief Narrative:   49 y.o. WM PMHx Migraine, essential HTN, obesity, GERD, nephrolithiasis,  prior abdominal surgeries including umbilical hernia repair as well as recent hospitalization from 04/13/2020 through 04/19/2020 for acute diverticulitis with contained perforation where he received 7 days of IV antibiotics and was discharged on oral Augmentin for 7 days which he completed just 2 days ago.    According to patient, he was doing fine up until 10 PM last night when he started having abdominal pain.  Per him, his abdominal pain last time was in the lower abdomen but this time it was in the middle and upper abdomen.  It was 10/10, nonradiating, crampy with no aggravating or relieving factor.  It was associated with chills, sweating and some nausea but no fever or vomiting or any diarrhea.  He went to [facility].  Currently he has no abdominal pain or any other symptoms.  ED Course: Upon arrival to ED, he was hemodynamically stable with slightly diastolic elevated blood pressure.  Repeat CT abdomen and pelvis was done which once again showed diverticulitis with contained perforation.  Had mild leukocytosis.  Received Zosyn over there and was transferred to [facility] for admission under hospitalist service.   Subjective: 5/14 A/O x4, negative CP, negative S OB.  States positive abdominal pain but decreasing from 5/13.  Has had several BM overnight.  Ambulated around the ward.    Assessment & Plan:   Principal Problem:   Diverticulitis of large intestine with perforation and abscess s/p lap drainage & washout 04/30/2020 Active Problems:   Hypertension   GERD (gastroesophageal reflux disease)   Hepatic steatosis   Obesity, [name], BMI 30.0-34.9 (see actual BMI)   Acute diverticulitis   Acute calculous cholecystitis s/p lap cholecystectomy 04/30/2020   Hypertension   GERD  (gastroesophageal reflux disease)   Acute diverticulitis   Recurrent acute diverticulitis with contained perforation/cholecystitis -Per [facility] note 5/12 plan is for laparoscopic cholecystectomy, diagnostic laparoscopy with wash out and drain placement tomorrow -5/14 JP drain x1 in place mild serosanguineous fluid draining. Multiple laparoscopic incision sites covered and clean. -Continue IV antibiotics per surgery. -Per surgery at discharge p.o. ciprofloxacin/Flagyl.  Will follow up with Dr. [name] several weeks to discuss partial colectomy  Essential HTN -Medication on hold patient surgery -5/14 BP still controlled without medication.  Monitor closely.  GERD:. -Asymptomatic  DVT prophylaxis: Lovenox  code Status: Full Family Communication: 5/14 wife at bedside discussed plan of care answered all questions Disposition Plan:  Status is: Inpatient  Dispo: The patient is from: Home              Anticipated d/c is to:??              Anticipated d/c date is: Per surgery              Patient currently unstable         Consultants:  [facility]    Procedures/Significant Events:   5/12 plan is for laparoscopic cholecystectomy, diagnostic laparoscopy with wash out and drain placement tomorrow   I have personally reviewed and interpreted all radiology studies and my findings are as above.  VENTILATOR SETTINGS:    Cultures   Antimicrobials: Anti-infectives (From admission, onward)   Start     Dose/Rate Stop   04/30/20 1131  clindamycin (CLEOCIN) 900 mg, gentamicin (GARAMYCIN) 240 mg in sodium chloride 0.9 %  1,000 mL for intraperitoneal lavage  Status:  Discontinued      04/30/20 1405   04/30/20 1100  piperacillin-tazobactam (ZOSYN) IVPB 3.375 g     3.375 g 100 mL/hr over 30 Minutes 04/30/20 1130   04/30/20 1015  clindamycin (CLEOCIN) 900 mg, gentamicin (GARAMYCIN) 240 mg in sodium chloride 0.9 % 1,000 mL for intraperitoneal lavage  Status:  Discontinued    Note to Pharmacy:  Have in the  [place] room for final irrigation in bowel surgery case to minimize risk of abscess/infection Pharmacy may adjust dosing strength, schedule, rate of infusion, etc as needed to optimize therapy    04/30/20 1405   04/30/20 1000  sodium chloride 0.9 % with cefoTEtan (CEFOTAN) ADS Med    Note to Pharmacy: Danley Danker   : cabinet override    04/30/20 1028   04/30/20 1000  piperacillin-tazobactam (ZOSYN) 3.375 GM/50ML IVPB    Note to Pharmacy: Danley Danker   : cabinet override    04/30/20 1101   04/29/20 1400  piperacillin-tazobactam (ZOSYN) IVPB 3.375 g  Status:  Discontinued     3.375 g 100 mL/hr over 30 Minutes 04/29/20 1124   04/29/20 1000  piperacillin-tazobactam (ZOSYN) IVPB 3.375 g     3.375 g 12.5 mL/hr over 240 Minutes     04/29/20 0330  piperacillin-tazobactam (ZOSYN) IVPB 3.375 g     3.375 g 100 mL/hr over 30 Minutes 04/29/20 0423       Devices    LINES / TUBES:      Continuous Infusions: . sodium chloride 75 mL/hr at 05/02/20 0748  . sodium chloride    . piperacillin-tazobactam (ZOSYN)  IV 3.375 g (05/02/20 1342)     Objective: Vitals:   05/01/20 1411 05/01/20 2234 05/02/20 0606 05/02/20 1401  BP: 132/80 121/82 (!) 122/91 119/87  Pulse: 64 (!) 51 63 69  Resp: 17 18 18 17   Temp: 98.1 F (36.7 C) 97.9 F (36.6 C) 98.5 F (36.9 C) 97.6 F (36.4 C)  TempSrc: Oral Oral Oral   SpO2: 97% 95% 97% 94%  Weight:      Height:        Intake/Output Summary (Last 24 hours) at 05/02/2020 1619 Last data filed at 05/02/2020 [id number] Gross per 24 hour  Intake 1214.87 ml  Output 50 ml  Net 1164.87 ml   Filed Weights   04/29/20 0019 04/30/20 0944  Weight: 104.3 kg 104 kg   Physical Exam:  General: A/O x4, no acute respiratory distress Eyes: negative scleral hemorrhage, negative anisocoria, negative icterus ENT: Negative Runny nose, negative gingival bleeding, Neck:  Negative scars, masses, torticollis, lymphadenopathy, JVD Lungs: Clear to auscultation  bilaterally without wheezes or crackles Cardiovascular: Regular rate and rhythm without murmur gallop or rub normal S1 and S2 Abdomen: Positive abdominal pain, positive distended, positive soft, bowel sounds, no rebound, no ascites, no appreciable mass, multiple laparoscopic incision sites covered and clean negative site of bleeding, 1 JP drain RLQ draining serosanguineous fluid Extremities: No significant cyanosis, clubbing, or edema bilateral lower extremities Skin: Negative rashes, lesions, ulcers Psychiatric:  Negative depression, negative anxiety, negative fatigue, negative mania  Central nervous system:  Cranial nerves II through XII intact, tongue/uvula midline, all extremities muscle strength 5/5, sensation intact throughout,negative dysarthria, negative expressive aphasia, negative receptive aphasia.   Data Reviewed: Care during the described time interval was provided by me .  I have reviewed this patient's available data, including medical history, events of note, physical examination, and all test results as part of  my evaluation.  CBC: Recent Labs  Lab 04/28/20 2335 04/30/20 0305 05/01/20 0311 05/02/20 0331  WBC 10.8* 9.5 13.9* 11.6*  NEUTROABS  --   --  11.9* 7.8*  HGB 15.7 14.3 13.7 12.5*  HCT 44.9 42.5 40.8 37.4*  MCV 88.0 89.3 89.5 90.6  PLT 414* 355 380 [id number]   Basic Metabolic Panel: Recent Labs  Lab 04/28/20 2335 04/30/20 0305 05/01/20 0311 05/02/20 0331  NA 143 138 133* 137  K 4.0 4.0 4.3 4.0  CL 103 105 102 105  CO2 28 25 23 24   GLUCOSE 119* 93 126* 94  BUN 9 7 10 7   CREATININE 1.02 0.90 0.89 0.88  CALCIUM 9.6 8.9 8.6* 8.4*  MG  --   --  2.0 2.0  PHOS  --   --  3.8 2.8   GFR: Estimated Creatinine Clearance: 126.7 mL/min (by C-G formula based on SCr of 0.88 mg/dL). Liver Function Tests: Recent Labs  Lab 04/28/20 2335 04/30/20 0305 05/01/20 0311 05/02/20 0331  AST 23 18 43* 24  ALT 29 26 52* 40  ALKPHOS 66 53 51 45  BILITOT 0.7 1.3* 1.1 0.9    PROT 7.7 6.6 6.3* 6.0*  ALBUMIN 4.1 3.7 3.5 3.3*   Recent Labs  Lab 04/28/20 2335  LIPASE 45   No results for input(s): AMMONIA in the last 168 hours. Coagulation Profile: No results for input(s): INR, PROTIME in the last 168 hours. Cardiac Enzymes: No results for input(s): CKTOTAL, CKMB, CKMBINDEX, TROPONINI in the last 168 hours. BNP (last 3 results) No results for input(s): PROBNP in the last 8760 hours. HbA1C: No results for input(s): HGBA1C in the last 72 hours. CBG: No results for input(s): GLUCAP in the last 168 hours. Lipid Profile: No results for input(s): CHOL, HDL, LDLCALC, TRIG, CHOLHDL, LDLDIRECT in the last 72 hours. Thyroid Function Tests: No results for input(s): TSH, T4TOTAL, FREET4, T3FREE, THYROIDAB in the last 72 hours. Anemia Panel: No results for input(s): VITAMINB12, FOLATE, FERRITIN, TIBC, IRON, RETICCTPCT in the last 72 hours. Sepsis Labs: Recent Labs  Lab 04/29/20 0236  LATICACIDVEN 1.3    Recent Results (from the past 240 hour(s))  SARS Coronavirus 2 by RT PCR (hospital order, performed in [facility] hospital lab) Nasopharyngeal Nasopharyngeal Swab     Status: None   Collection Time: 04/29/20  4:09 AM   Specimen: Nasopharyngeal Swab  Result Value Ref Range Status   SARS Coronavirus 2 NEGATIVE NEGATIVE Final    Comment: (NOTE) SARS-CoV-2 target nucleic acids are NOT DETECTED. The SARS-CoV-2 RNA is generally detectable in upper and lower respiratory specimens during the acute phase of infection. The lowest concentration of SARS-CoV-2 viral copies this assay can detect is 250 copies / mL. A negative result does not preclude SARS-CoV-2 infection and should not be used as the sole basis for treatment or other patient management decisions.  A negative result may occur with improper specimen collection / handling, submission of specimen other than nasopharyngeal swab, presence of viral mutation(s) within the areas targeted by this assay, and  inadequate number of viral copies (<250 copies / mL). A negative result must be combined with clinical observations, patient history, and epidemiological information. Fact Sheet for Patients:   StrictlyIdeas.no Fact Sheet for Healthcare Providers: BankingDealers.co.za This test is not yet approved or cleared  by the [place] FDA and has been authorized for detection and/or diagnosis of SARS-CoV-2 by FDA under an Emergency Use Authorization (EUA).  This EUA will remain in effect (meaning this  test can be used) for the duration of the COVID-19 declaration under Section 564(b)(1) of the Act, 21 U.S.C. section 360bbb-3(b)(1), unless the authorization is terminated or revoked sooner. Performed at [facility], [address] [place], [place] [address]   Surgical pcr screen     Status: None   Collection Time: 04/30/20  6:12 AM   Specimen: Nasal Mucosa; Nasal Swab  Result Value Ref Range Status   MRSA, PCR NEGATIVE NEGATIVE Final   Staphylococcus aureus NEGATIVE NEGATIVE Final    Comment: (NOTE) The Xpert SA Assay (FDA approved for NASAL specimens in patients 22 years of age and older), is one component of a comprehensive surveillance program. It is not intended to diagnose infection nor to guide or monitor treatment. Performed at [facility], [place] [address], [place], [place] [address]          Radiology Studies: No results found.      Scheduled Meds: . acetaminophen  1,000 mg Oral TID  . amLODipine  10 mg Oral Daily  . enoxaparin (LOVENOX) injection  40 mg Subcutaneous Q24H  . gabapentin  300 mg Oral BID  . lip balm  1 application Topical BID  . metoprolol succinate  50 mg Oral Q breakfast  . omega-3 acid ethyl esters  1 g Oral Daily  . pantoprazole  40 mg Oral BID  . saccharomyces boulardii  250 mg Oral BID  . sodium chloride flush  3 mL Intravenous Q12H   Continuous Infusions: .  sodium chloride 75 mL/hr at 05/02/20 0748  . sodium chloride    . piperacillin-tazobactam (ZOSYN)  IV 3.375 g (05/02/20 1342)     LOS: 3 days    Time spent:40 min    Pecola Haxton, Geraldo Docker, MD [facility] Pager [phone]  If 7PM-7AM, please contact night-coverage [email or web address] Password [facility] 05/02/2020, 4:19 PM

## 2020-05-02 NOTE — Progress Notes (Signed)
Jordan Buchanan [id number] 03-20-1971  CARE TEAM:  PCP: Ria Bush, MD  Outpatient Care Team: [name], [name], [name], [name], [name], MD as Consulting Physician (General Surgery)  Inpatient Treatment Team: Treatment Team: Attending Provider: Nolon Nations, MD; Consulting Physician: Edison Pace Md, MD; Registered Nurse: Yvette Rack, RN; Rounding Team: Suzan Garibaldi, MD; Utilization Review: Sindy Guadeloupe, RN; Social Worker: Lia Hopping, LCSW   Problem List:   Principal Problem:   Diverticulitis of large intestine with perforation and abscess s/p lap drainage & washout 04/30/2020 Active Problems:   Hypertension   GERD (gastroesophageal reflux disease)   Hepatic steatosis   Obesity, [name], BMI 30.0-34.9 (see actual BMI)   Acute diverticulitis   Acute calculous cholecystitis s/p lap cholecystectomy 04/30/2020   2 Days Post-Op  04/30/2020  POST-OPERATIVE DIAGNOSIS:   Acute on Chronic Calculus Cholecystitis Sigmoid diverticulitis with abscess Liver: Fatty steatohepatitis  PROCEDURE:  Laparoscopic cholecystectomy with intraoperative cholangiogram  Sigmoid colon mobilization with drainage of abscess, washout, and drain placement  SURGEON:  Adin Hector, MD, FACS.  Assessment  Gradually recovering  [facility] Stay = 3 days)    Assessment/Plan HTN GERD Nephrolithiasis OSA Obesity BMI 33  Acute sigmoid diverticulitis with perforation Acute cholecystitis -S/p laparoscopic cholecystectomy with IOC, sigmoid colon mobilization with drainage of abscess, washout, and drain placement 5/12 Dr. [name]  - continue IV Zosyn - plan to continue drainage and oral antibiotics (cipro/flagyl) at discharge - patient will follow up with Dr. [name] and schedule elective partial colectomy in the  next several weeks - ADAT soft  ID -zosyn 5/11>> VTE -SCDs, lovenox FEN -IVF, FLD Foley -none Follow up - Dr. [name]   Plan: Advance to full liquids. Continue IV zosyn and drain, monitor output. Thorazine PRN hiccups. Continue scheduled tylenol/ibuprofen, add oxycodone PRN moderate/severe pain and keep IV dilaudid for breakthrough pain. Mobilize. Labs in AM.   LOS: 2 days   05/02/2020    Subjective: (Chief complaint)  Less bloated.  Trying to use oral pain medications.  No more hiccups.  Heartburn controlled.  Wife in room.  Nurse just outside door.  Objective:  Vital signs:  Vitals:   05/01/20 0934 05/01/20 1411 05/01/20 2234 05/02/20 0606  BP: 116/82 132/80 121/82 (!) 122/91  Pulse: (!) 58 64 (!) 51 63  Resp: 17 17 18 18   Temp: 97.9 F (36.6 C) 98.1 F (36.7 C) 97.9 F (36.6 C) 98.5 F (36.9 C)  TempSrc:  Oral Oral Oral  SpO2: 99% 97% 95% 97%  Weight:      Height:        Last BM Date: 04/28/20  Intake/Output   Yesterday:  05/13 0701 - 05/14 0700 In: 2177.3 [P.O.:1020; I.V.:957.3; IV Piggyback:200] Out: [id number] [Urine:1025; Drains:60] This shift:  No intake/output data recorded.  Bowel function:  Flatus: YES  BM:  YES  Drain: Serosanguinous   Physical Exam:  General: Pt awake/alert in no acute distress Eyes: PERRL, normal EOM.  Sclera clear.  No icterus Neuro: CN II-XII intact w/o focal sensory/motor deficits. Lymph: No head/neck/groin lymphadenopathy Psych:  No delerium/psychosis/paranoia.  Oriented x 4 HENT: Normocephalic, Mucus membranes moist.  No thrush Neck: Supple, No tracheal deviation.  No obvious thyromegaly Chest: No pain to chest wall compression.  Good respiratory excursion.  No audible wheezing CV:  Pulses intact.  Regular rhythm.  No major extremity  edema MS: Normal AROM mjr joints.  No obvious deformity  Abdomen: Soft.  Mildy distended.  Mildly tender at incisions only.  No evidence of peritonitis.  No incarcerated  hernias.  Ext:   No deformity.  No mjr edema.  No cyanosis Skin: No petechiae / purpurea.  No major sores.  Warm and dry    Results:   Cultures: Recent Results (from the past 720 hour(s))  Urine culture     Status: None   Collection Time: 04/13/20 11:25 AM   Specimen: Urine, Random  Result Value Ref Range Status   Specimen Description   Final    URINE, RANDOM Performed at [facility], [address] [place], [place] [address]    Special Requests   Final    NONE Performed at [facility], [address] [place], [place] [address]    Culture   Final    NO GROWTH Performed at [facility], [place] [address], [place], [place] [address]    Report Status 04/14/2020 FINAL  Final  Respiratory Panel by RT PCR (Flu A&B, Covid) - Urine, Clean Catch     Status: None   Collection Time: 04/13/20 11:28 AM   Specimen: Urine, Clean Catch  Result Value Ref Range Status   SARS Coronavirus 2 by RT PCR NEGATIVE NEGATIVE Final    Comment: (NOTE) SARS-CoV-2 target nucleic acids are NOT DETECTED. The SARS-CoV-2 RNA is generally detectable in upper respiratoy specimens during the acute phase of infection. The lowest concentration of SARS-CoV-2 viral copies this assay can detect is 131 copies/mL. A negative result does not preclude SARS-Cov-2 infection and should not be used as the sole basis for treatment or other patient management decisions. A negative result may occur with  improper specimen collection/handling, submission of specimen other than nasopharyngeal swab, presence of viral mutation(s) within the areas targeted by this assay, and inadequate number of viral copies (<131 copies/mL). A negative result must be combined with clinical observations, patient history, and epidemiological information. The expected result is Negative. Fact Sheet for Patients:  PinkCheek.be Fact Sheet for Healthcare Providers:    GravelBags.it This test is not yet ap proved or cleared by the [place] FDA and  has been authorized for detection and/or diagnosis of SARS-CoV-2 by FDA under an Emergency Use Authorization (EUA). This EUA will remain  in effect (meaning this test can be used) for the duration of the COVID-19 declaration under Section 564(b)(1) of the Act, 21 U.S.C. section 360bbb-3(b)(1), unless the authorization is terminated or revoked sooner.    Influenza A by PCR NEGATIVE NEGATIVE Final   Influenza B by PCR NEGATIVE NEGATIVE Final    Comment: (NOTE) The Xpert Xpress SARS-CoV-2/FLU/RSV assay is intended as an aid in  the diagnosis of influenza from Nasopharyngeal swab specimens and  should not be used as a sole basis for treatment. Nasal washings and  aspirates are unacceptable for Xpert Xpress SARS-CoV-2/FLU/RSV  testing. Fact Sheet for Patients: PinkCheek.be Fact Sheet for Healthcare Providers: GravelBags.it This test is not yet approved or cleared by the [place] FDA and  has been authorized for detection and/or diagnosis of SARS-CoV-2 by  FDA under an Emergency Use Authorization (EUA). This EUA will remain  in effect (meaning this test can be used) for the duration of the  Covid-19 declaration under Section 564(b)(1) of the Act, 21  U.S.C. section 360bbb-3(b)(1), unless the authorization is  terminated or revoked. Performed at [facility],  [address] [place], [place] [address]   SARS Coronavirus 2 by RT PCR (hospital order, performed in [facility] hospital lab) Nasopharyngeal Nasopharyngeal Swab     Status: None   Collection Time: 04/29/20  4:09 AM   Specimen: Nasopharyngeal Swab  Result Value Ref Range Status   SARS Coronavirus 2 NEGATIVE NEGATIVE Final    Comment: (NOTE) SARS-CoV-2 target nucleic acids are NOT DETECTED. The SARS-CoV-2 RNA is generally detectable in upper  and lower respiratory specimens during the acute phase of infection. The lowest concentration of SARS-CoV-2 viral copies this assay can detect is 250 copies / mL. A negative result does not preclude SARS-CoV-2 infection and should not be used as the sole basis for treatment or other patient management decisions.  A negative result may occur with improper specimen collection / handling, submission of specimen other than nasopharyngeal swab, presence of viral mutation(s) within the areas targeted by this assay, and inadequate number of viral copies (<250 copies / mL). A negative result must be combined with clinical observations, patient history, and epidemiological information. Fact Sheet for Patients:   StrictlyIdeas.no Fact Sheet for Healthcare Providers: BankingDealers.co.za This test is not yet approved or cleared  by the [place] FDA and has been authorized for detection and/or diagnosis of SARS-CoV-2 by FDA under an Emergency Use Authorization (EUA).  This EUA will remain in effect (meaning this test can be used) for the duration of the COVID-19 declaration under Section 564(b)(1) of the Act, 21 U.S.C. section 360bbb-3(b)(1), unless the authorization is terminated or revoked sooner. Performed at [facility], [address], [place], [place] [address]   Surgical pcr screen     Status: None   Collection Time: 04/30/20  6:12 AM   Specimen: Nasal Mucosa; Nasal Swab  Result Value Ref Range Status   MRSA, PCR NEGATIVE NEGATIVE Final   Staphylococcus aureus NEGATIVE NEGATIVE Final    Comment: (NOTE) The Xpert SA Assay (FDA approved for NASAL specimens in patients 58 years of age and older), is one component of a comprehensive surveillance program. It is not intended to diagnose infection nor to guide or monitor treatment. Performed at [facility], [place] [address], [place], [place] [address]      Labs: Results for orders placed or performed during the hospital encounter of 04/29/20 (from the past 48 hour(s))  Surgical pathology     Status: None   Collection Time: 04/30/20 11:01 AM  Result Value Ref Range   SURGICAL PATHOLOGY      SURGICAL PATHOLOGY CASE: [id number] PATIENT: Burton Apley Surgical Pathology Report     Clinical History: Acute cholecystitis, acute sigmoid diverticulitis (crm)     FINAL MICROSCOPIC DIAGNOSIS:  A. GALLBLADDER, CHOLECYSTECTOMY: - Acute and chronic cholecystitis. - Attached hepatic parenchyma. - There is no evidence of malignancy.   Sharise Lippy DESCRIPTION:  Size/?Intact: 7.3 x 3.6 x 1.8 cm previously opened gallbladder, received fresh Serosal surface: Smooth and hyperemic Mucosa/Wall: The mucosa is glistening tan-red.  The wall measures up to 0.6 cm in thickness. Contents: There are no contents.  There are no calculi present in the gallbladder or in the specimen container. Cystic duct: Patent Block Summary: 1 block submitted (GRP 04/30/2020)    Final Diagnosis performed by Enid Cutter, MD.   Electronically signed 05/01/2020 Technical component performed at [place] [address], [place], [place] [address].  Professional co mponent performed at [employer]. [facility], [place] [address], [place], [place]  [id number].  Immunohistochemistry Technical component (if applicable) was performed at [facility]. [address], [place], [place], [place] [address].   IMMUNOHISTOCHEMISTRY DISCLAIMER (if applicable): Some of these immunohistochemical stains may have been developed and the performance characteristics determine by [facility]. Some may not have been cleared or approved by the U.S. Food and Drug Administration. The FDA has determined that such clearance or approval is not necessary. This test is used for clinical purposes. It should not be regarded as  investigational or for research. This laboratory is certified under the [place] (CLIA-88) as qualified to perform high complexity clinical laboratory testing.  The controls stained appropriately.   Comprehensive metabolic panel     Status: Abnormal   Collection Time: 05/01/20  3:11 AM  Result Value Ref Range   Sodium 133 (L) 135 - 145 mmol/L   Potassium 4.3 3.5 - 5.1 mmol/L   Chloride 102 98 - 111 mmol/L   CO2 23 22 - 32 mmol/L   Glucose, Bld 126 (H) 70 - 99 mg/dL    Comment: Glucose reference range applies only to samples taken after fasting for at least 8 hours.   BUN 10 6 - 20 mg/dL   Creatinine, Ser 0.89 0.61 - 1.24 mg/dL   Calcium 8.6 (L) 8.9 - 10.3 mg/dL   Total Protein 6.3 (L) 6.5 - 8.1 g/dL   Albumin 3.5 3.5 - 5.0 g/dL   AST 43 (H) 15 - 41 U/L   ALT 52 (H) 0 - 44 U/L   Alkaline Phosphatase 51 38 - 126 U/L   Total Bilirubin 1.1 0.3 - 1.2 mg/dL   GFR calc non Af Amer >60 >60 mL/min   GFR calc Af Amer >60 >60 mL/min   Anion gap 8 5 - 15    Comment: Performed at [facility], [place] [address] [place], [place] [address]  Magnesium     Status: None   Collection Time: 05/01/20  3:11 AM  Result Value Ref Range   Magnesium 2.0 1.7 - 2.4 mg/dL    Comment: Performed at [facility], [place] [address] [place], [place] [address]  Phosphorus     Status: None   Collection Time: 05/01/20  3:11 AM  Result Value Ref Range   Phosphorus 3.8 2.5 - 4.6 mg/dL    Comment: Performed at [facility], [place] [address], [place], [place] [address]  CBC with Differential/Platelet     Status: Abnormal   Collection Time: 05/01/20  3:11 AM  Result Value Ref Range   WBC 13.9 (H) 4.0 - 10.5 K/uL   RBC 4.56 4.22 - 5.81 MIL/uL   Hemoglobin 13.7 13.0 - 17.0 g/dL   HCT 40.8 39.0 - 52.0 %   MCV 89.5 80.0 - 100.0 fL   MCH 30.0 26.0 - 34.0 pg   MCHC 33.6 30.0 - 36.0 g/dL   RDW 12.1 11.5 - 15.5 %   Platelets  380 150 - 400 K/uL   nRBC 0.0 0.0 - 0.2 %   Neutrophils Relative % 86 %   Neutro Abs 11.9 (H) 1.7 - 7.7 K/uL   Lymphocytes Relative 8 %   Lymphs Abs 1.1 0.7 - 4.0 K/uL   Monocytes Relative 6 %   Monocytes Absolute 0.8 0.1 - 1.0 K/uL   Eosinophils Relative 0 %   Eosinophils Absolute 0.0 0.0 - 0.5 K/uL   Basophils Relative 0 %   Basophils Absolute 0.0 0.0 -  0.1 K/uL   Immature Granulocytes 0 %   Abs Immature Granulocytes 0.06 0.00 - 0.07 K/uL    Comment: Performed at [facility], [place] [address], [place], [place] [address]  Comprehensive metabolic panel     Status: Abnormal   Collection Time: 05/02/20  3:31 AM  Result Value Ref Range   Sodium 137 135 - 145 mmol/L   Potassium 4.0 3.5 - 5.1 mmol/L   Chloride 105 98 - 111 mmol/L   CO2 24 22 - 32 mmol/L   Glucose, Bld 94 70 - 99 mg/dL    Comment: Glucose reference range applies only to samples taken after fasting for at least 8 hours.   BUN 7 6 - 20 mg/dL   Creatinine, Ser 0.88 0.61 - 1.24 mg/dL   Calcium 8.4 (L) 8.9 - 10.3 mg/dL   Total Protein 6.0 (L) 6.5 - 8.1 g/dL   Albumin 3.3 (L) 3.5 - 5.0 g/dL   AST 24 15 - 41 U/L   ALT 40 0 - 44 U/L   Alkaline Phosphatase 45 38 - 126 U/L   Total Bilirubin 0.9 0.3 - 1.2 mg/dL   GFR calc non Af Amer >60 >60 mL/min   GFR calc Af Amer >60 >60 mL/min   Anion gap 8 5 - 15    Comment: Performed at [facility], [place] [address], [place], [place] [address]  Magnesium     Status: None   Collection Time: 05/02/20  3:31 AM  Result Value Ref Range   Magnesium 2.0 1.7 - 2.4 mg/dL    Comment: Performed at [facility], [place] [address], [place], [place] [address]  Phosphorus     Status: None   Collection Time: 05/02/20  3:31 AM  Result Value Ref Range   Phosphorus 2.8 2.5 - 4.6 mg/dL    Comment: Performed at [facility], [place] [address], [place], [place] [address]  CBC with Differential/Platelet     Status: Abnormal    Collection Time: 05/02/20  3:31 AM  Result Value Ref Range   WBC 11.6 (H) 4.0 - 10.5 K/uL   RBC 4.13 (L) 4.22 - 5.81 MIL/uL   Hemoglobin 12.5 (L) 13.0 - 17.0 g/dL   HCT 37.4 (L) 39.0 - 52.0 %   MCV 90.6 80.0 - 100.0 fL   MCH 30.3 26.0 - 34.0 pg   MCHC 33.4 30.0 - 36.0 g/dL   RDW 12.5 11.5 - 15.5 %   Platelets 305 150 - 400 K/uL   nRBC 0.0 0.0 - 0.2 %   Neutrophils Relative % 68 %   Neutro Abs 7.8 (H) 1.7 - 7.7 K/uL   Lymphocytes Relative 22 %   Lymphs Abs 2.6 0.7 - 4.0 K/uL   Monocytes Relative 8 %   Monocytes Absolute 0.9 0.1 - 1.0 K/uL   Eosinophils Relative 2 %   Eosinophils Absolute 0.2 0.0 - 0.5 K/uL   Basophils Relative 0 %   Basophils Absolute 0.0 0.0 - 0.1 K/uL   Immature Granulocytes 0 %   Abs Immature Granulocytes 0.05 0.00 - 0.07 K/uL    Comment: Performed at [facility], [place] [address], [place], [place] [address]    Imaging / Studies: DG Cholangiogram Operative  Result Date: 04/30/2020 CLINICAL DATA:  49 year old male undergoing laparoscopic cholecystectomy. EXAM: INTRAOPERATIVE CHOLANGIOGRAM TECHNIQUE: Cholangiographic images from the C-arm fluoroscopic device were submitted for interpretation post-operatively. Please see the procedural report for the amount of contrast and the  fluoroscopy time utilized. COMPARISON:  None. FINDINGS: A single cine clip demonstrates cannulation of the cystic duct remanent and intraoperative cholangiogram. There is no evidence of biliary ductal dilatation, stenosis, stricture or choledocholithiasis. Contrast material refluxes into the distal aspect of the main pancreatic duct. Contrast passes freely through the ampulla and into the duodenum. IMPRESSION: Negative intraoperative cholangiogram. Electronically Signed   By: Jacqulynn Cadet M.D.   On: 04/30/2020 17:05    Medications / Allergies: per chart  Antibiotics: Anti-infectives (From admission, onward)   Start     Dose/Rate Route Frequency Ordered Stop   04/30/20  1131  clindamycin (CLEOCIN) 900 mg, gentamicin (GARAMYCIN) 240 mg in sodium chloride 0.9 % 1,000 mL for intraperitoneal lavage  Status:  Discontinued       As needed 04/30/20 1132 04/30/20 1405   04/30/20 1100  piperacillin-tazobactam (ZOSYN) IVPB 3.375 g     3.375 g 100 mL/hr over 30 Minutes Intravenous On call to O.R. 04/30/20 1059 04/30/20 1130   04/30/20 1015  clindamycin (CLEOCIN) 900 mg, gentamicin (GARAMYCIN) 240 mg in sodium chloride 0.9 % 1,000 mL for intraperitoneal lavage  Status:  Discontinued    Note to Pharmacy: Have in the  [place] room for final irrigation in bowel surgery case to minimize risk of abscess/infection Pharmacy may adjust dosing strength, schedule, rate of infusion, etc as needed to optimize therapy    Irrigation To Surgery 04/30/20 1008 04/30/20 1405   04/30/20 1000  sodium chloride 0.9 % with cefoTEtan (CEFOTAN) ADS Med    Note to Pharmacy: Danley Danker   : cabinet override      04/30/20 1000 04/30/20 1028   04/30/20 1000  piperacillin-tazobactam (ZOSYN) 3.375 GM/50ML IVPB    Note to Pharmacy: Danley Danker   : cabinet override      04/30/20 1000 04/30/20 1101   04/29/20 1400  piperacillin-tazobactam (ZOSYN) IVPB 3.375 g  Status:  Discontinued     3.375 g 100 mL/hr over 30 Minutes Intravenous Every 8 hours 04/29/20 1118 04/29/20 1124   04/29/20 1000  piperacillin-tazobactam (ZOSYN) IVPB 3.375 g     3.375 g 12.5 mL/hr over 240 Minutes Intravenous Every 8 hours 04/29/20 0430     04/29/20 0330  piperacillin-tazobactam (ZOSYN) IVPB 3.375 g     3.375 g 100 mL/hr over 30 Minutes Intravenous  Once 04/29/20 0327 04/29/20 0423        Note: Portions of this report may have been transcribed using voice recognition software. Every effort was made to ensure accuracy; however, inadvertent computerized transcription errors may be present.   Any transcriptional errors that result from this process are unintentional.    Adin Hector, MD, FACS, MASCRS Gastrointestinal  and Minimally Invasive Surgery  [facility] Surgery [address] [place], [place] [address] [phone] Fax [phone] Main/Paging  CONTACT INFORMATION: Weekday (9AM-5PM) concerns: Call [facility] main office at [phone] Weeknight (5PM-9AM) or Weekend/Holiday concerns: Check [email or web address] for General Surgery [facility] coverage (Please, do not use SecureChat as it is not reliable communication to surgeons for patient care)      05/02/2020  9:22 AM

## 2020-05-03 LAB — CBC WITH DIFFERENTIAL/PLATELET
Abs Immature Granulocytes: 0.05 10*3/uL (ref 0.00–0.07)
Basophils Absolute: 0.1 10*3/uL (ref 0.0–0.1)
Basophils Relative: 0 %
Eosinophils Absolute: 0.3 10*3/uL (ref 0.0–0.5)
Eosinophils Relative: 2 %
HCT: 39.7 % (ref 39.0–52.0)
Hemoglobin: 13.2 g/dL (ref 13.0–17.0)
Immature Granulocytes: 0 %
Lymphocytes Relative: 14 %
Lymphs Abs: 2.1 10*3/uL (ref 0.7–4.0)
MCH: 29.9 pg (ref 26.0–34.0)
MCHC: 33.2 g/dL (ref 30.0–36.0)
MCV: 89.8 fL (ref 80.0–100.0)
Monocytes Absolute: 1.3 10*3/uL — ABNORMAL HIGH (ref 0.1–1.0)
Monocytes Relative: 9 %
Neutro Abs: 11.1 10*3/uL — ABNORMAL HIGH (ref 1.7–7.7)
Neutrophils Relative %: 75 %
Platelets: 318 10*3/uL (ref 150–400)
RBC: 4.42 MIL/uL (ref 4.22–5.81)
RDW: 12.3 % (ref 11.5–15.5)
WBC: 14.9 10*3/uL — ABNORMAL HIGH (ref 4.0–10.5)
nRBC: 0 % (ref 0.0–0.2)

## 2020-05-03 LAB — COMPREHENSIVE METABOLIC PANEL
ALT: 29 U/L (ref 0–44)
AST: 16 U/L (ref 15–41)
Albumin: 3.2 g/dL — ABNORMAL LOW (ref 3.5–5.0)
Alkaline Phosphatase: 45 U/L (ref 38–126)
Anion gap: 8 (ref 5–15)
BUN: 5 mg/dL — ABNORMAL LOW (ref 6–20)
CO2: 25 mmol/L (ref 22–32)
Calcium: 8.4 mg/dL — ABNORMAL LOW (ref 8.9–10.3)
Chloride: 101 mmol/L (ref 98–111)
Creatinine, Ser: 0.93 mg/dL (ref 0.61–1.24)
GFR calc Af Amer: >60 mL/min (ref >60)
GFR calc non Af Amer: >60 mL/min (ref >60)
Glucose, Bld: 96 mg/dL (ref 70–99)
Potassium: 3.8 mmol/L (ref 3.5–5.1)
Sodium: 134 mmol/L — ABNORMAL LOW (ref 135–145)
Total Bilirubin: 1.4 mg/dL — ABNORMAL HIGH (ref 0.3–1.2)
Total Protein: 6 g/dL — ABNORMAL LOW (ref 6.5–8.1)

## 2020-05-03 LAB — MAGNESIUM: Magnesium: 1.9 mg/dL (ref 1.7–2.4)

## 2020-05-03 LAB — PHOSPHORUS: Phosphorus: 4 mg/dL (ref 2.5–4.6)

## 2020-05-03 MED ORDER — METRONIDAZOLE 500 MG PO TABS
500.0000 mg | ORAL_TABLET | Freq: Three times a day (TID) | ORAL | 0 refills | Status: AC
Start: 2020-05-03 — End: 2020-05-13

## 2020-05-03 MED ORDER — CIPROFLOXACIN HCL 500 MG PO TABS
500.0000 mg | ORAL_TABLET | Freq: Two times a day (BID) | ORAL | 2 refills | Status: DC
Start: 2020-05-03 — End: 2020-06-17

## 2020-05-03 MED ORDER — OXYCODONE HCL 5 MG PO TABS: 5.0000 mg | ORAL_TABLET | Freq: Four times a day (QID) | ORAL | 0 refills | Status: DC | PRN

## 2020-05-03 NOTE — Discharge Summary (Signed)
Patient discharged by General Surgery.

## 2020-05-03 NOTE — Progress Notes (Signed)
[place] Surgery Office:  [phone] General Surgery Progress Note   LOS: 4 days  POD -  3 Days Post-Op  Assessment and Plan: 1.  LapAROSCOPIC CHOLECYSTECTOMY WITH INTRAOPERATIVE CHOLANGIOGRAM, LAPAROSCOPY DIAGNOSTIC WITH WASHOUT AND DRAINAGE PLACEMENT, DRAINAGE OF DIVERTICULITIS ABSCESS - 04/30/2020 - Gross  For cholecystitis and diverticulitis   WBC - 14,900 - 05/03/2020 (up a little from yesterday)  Has RUQ drain - but this is draining colon  Zosyn -   Taking diet, no abdominal pain, no fever, ready to go home.  I think he will be okay going home.  He will follow up with Dr. [name] in about 2 weeks.  2.  DVT prophylaxis - Lovenox   Principal Problem:   Diverticulitis of large intestine with perforation and abscess s/p lap drainage & washout 04/30/2020 Active Problems:   Hypertension   GERD (gastroesophageal reflux disease)   Hepatic steatosis   Obesity, [name], BMI 30.0-34.9 (see actual BMI)   Acute diverticulitis   Acute calculous cholecystitis s/p lap cholecystectomy 04/30/2020  Subjective:  Doing well and ready to go home.  Wife [name] with the patient.  Objective:   Vitals:   05/02/20 2149 05/03/20 0524  BP: 135/76 (!) 141/98  Pulse: 61 71  Resp: 15 15  Temp: 98.3 F (36.8 C) 98.8 F (37.1 C)  SpO2: 94% 97%     Intake/Output from previous day:  05/14 0701 - 05/15 0700 In: 360 [P.O.:360] Out: 14 [Urine:4; Drains:10]  Intake/Output this shift:  Total I/O In: 450 [I.V.:300; IV Piggyback:150] Out: 40 [Drains:40]   Physical Exam:   General: Obese WM who is alert and oriented.    HEENT: Normal. Pupils equal. .   Lungs: Clear   Abdomen: Soft   Wound: Drain in RUQ   Lab Results:    Recent Labs    05/02/20 0331 05/03/20 0326  WBC 11.6* 14.9*  HGB 12.5* 13.2  HCT 37.4* 39.7  PLT 305 318    BMET   Recent Labs    05/02/20 0331 05/03/20 0326  NA 137 134*  K 4.0 3.8  CL 105 101  CO2 24 25  GLUCOSE 94 96  BUN 7 5*  CREATININE 0.88  0.93  CALCIUM 8.4* 8.4*    PT/INR  No results for input(s): LABPROT, INR in the last 72 hours.  ABG  No results for input(s): PHART, HCO3 in the last 72 hours.  Invalid input(s): PCO2, PO2   Studies/Results:  No results found.   Anti-infectives:   Anti-infectives (From admission, onward)   Start     Dose/Rate Route Frequency Ordered Stop   04/30/20 1131  clindamycin (CLEOCIN) 900 mg, gentamicin (GARAMYCIN) 240 mg in sodium chloride 0.9 % 1,000 mL for intraperitoneal lavage  Status:  Discontinued       As needed 04/30/20 1132 04/30/20 1405   04/30/20 1100  piperacillin-tazobactam (ZOSYN) IVPB 3.375 g     3.375 g 100 mL/hr over 30 Minutes Intravenous On call to O.R. 04/30/20 1059 04/30/20 1130   04/30/20 1015  clindamycin (CLEOCIN) 900 mg, gentamicin (GARAMYCIN) 240 mg in sodium chloride 0.9 % 1,000 mL for intraperitoneal lavage  Status:  Discontinued    Note to Pharmacy: Have in the  [place] room for final irrigation in bowel surgery case to minimize risk of abscess/infection Pharmacy may adjust dosing strength, schedule, rate of infusion, etc as needed to optimize therapy    Irrigation To Surgery 04/30/20 1008 04/30/20 1405   04/30/20 1000  sodium chloride 0.9 %  with cefoTEtan (CEFOTAN) ADS Med    Note to Pharmacy: Danley Danker   : cabinet override      04/30/20 1000 04/30/20 1028   04/30/20 1000  piperacillin-tazobactam (ZOSYN) 3.375 GM/50ML IVPB    Note to Pharmacy: Danley Danker   : cabinet override      04/30/20 1000 04/30/20 1101   04/29/20 1400  piperacillin-tazobactam (ZOSYN) IVPB 3.375 g  Status:  Discontinued     3.375 g 100 mL/hr over 30 Minutes Intravenous Every 8 hours 04/29/20 1118 04/29/20 1124   04/29/20 1000  piperacillin-tazobactam (ZOSYN) IVPB 3.375 g     3.375 g 12.5 mL/hr over 240 Minutes Intravenous Every 8 hours 04/29/20 0430     04/29/20 0330  piperacillin-tazobactam (ZOSYN) IVPB 3.375 g     3.375 g 100 mL/hr over 30 Minutes Intravenous  Once 04/29/20 0327  04/29/20 0423      Alphonsa Overall, MD, [facility] Surgery Office: [phone] 05/03/2020

## 2020-05-03 NOTE — Progress Notes (Signed)
Reviewed AVS with patient and wife.  Questions asked and answered.  Patient alert and oriented, skin warm and dry.  Taken via wheelchair to main lobby and transferred to the care of his wife without concerns or complaints.

## 2020-05-03 NOTE — Discharge Summary (Signed)
Physician Discharge Summary  Patient ID:  Jordan Buchanan  MRN: [id number]  DOB/AGE: January 09, 1971 49 y.o.  Admit date: 04/29/2020 Discharge date: 05/03/2020  Discharge Diagnoses:   Principal Problem:   Diverticulitis of large intestine with perforation and abscess s/p lap drainage & washout 04/30/2020 Active Problems:   Hypertension   GERD (gastroesophageal reflux disease)   Hepatic steatosis   Obesity, [name], BMI 30.0-34.9 (see actual BMI)   Acute diverticulitis   Acute calculous cholecystitis s/p lap cholecystectomy 04/30/2020   Operation: Procedure(s):  LAPAROSCOPIC CHOLECYSTECTOMY WITH INTRAOPERATIVE CHOLANGIOGRAM, BILATERAL  LAPAROSCOPY DIAGNOSTIC WITH WASHOUT AND DRAINAGE PLACEMENT, DRAINAGE OF DIVERTICULITIS ABSCESS on 04/30/2020 [name]  Discharged Condition: good  Hospital Course: Jordan Buchanan is an 49 y.o. male whose primary care physician is Ria Bush, MD and who was admitted 04/29/2020 with a chief complaint of  Chief Complaint  Patient presents with  . Abdominal Pain  .   He was brought to the operating room on 04/30/2020 and underwent   LAPAROSCOPIC CHOLECYSTECTOMY WITH INTRAOPERATIVE CHOLANGIOGRAM, BILATERAL  LAPAROSCOPY DIAGNOSTIC WITH WASHOUT AND DRAINAGE PLACEMENT, DRAINAGE OF DIVERTICULITIS ABSCESS.   He is now 3 days post op.  He is afebrile, tolerating a diet, and having only post surgical pain.   His wife, [name], is in the room with him. He is ready to go home. He already knows the plan to follow up with Dr. [name].  The discharge instructions were reviewed with the patient.  Consults: None  Significant Diagnostic Studies: Results for orders placed or performed during the hospital encounter of 04/29/20  SARS Coronavirus 2 by RT PCR (hospital order, performed in [facility] hospital lab) Nasopharyngeal Nasopharyngeal Swab   Specimen: Nasopharyngeal Swab  Result Value Ref Range   SARS Coronavirus 2 NEGATIVE NEGATIVE  Surgical pcr screen    Specimen: Nasal Mucosa; Nasal Swab  Result Value Ref Range   MRSA, PCR NEGATIVE NEGATIVE   Staphylococcus aureus NEGATIVE NEGATIVE  D-dimer, quantitative (not at [facility])  Result Value Ref Range   D-Dimer, Quant 0.48 0.00 - 0.50 ug/mL-FEU  Lactic acid, plasma  Result Value Ref Range   Lactic Acid, Venous 1.3 0.5 - 1.9 mmol/L  Urinalysis, Routine w reflex microscopic  Result Value Ref Range   Color, Urine YELLOW YELLOW   APPearance CLEAR CLEAR   Specific Gravity, Urine 1.020 1.005 - 1.030   pH 6.0 5.0 - 8.0   Glucose, UA NEGATIVE NEGATIVE mg/dL   Hgb urine dipstick NEGATIVE NEGATIVE   Bilirubin Urine NEGATIVE NEGATIVE   Ketones, ur NEGATIVE NEGATIVE mg/dL   Protein, ur NEGATIVE NEGATIVE mg/dL   Nitrite NEGATIVE NEGATIVE   Leukocytes,Ua NEGATIVE NEGATIVE  Comprehensive metabolic panel  Result Value Ref Range   Sodium 138 135 - 145 mmol/L   Potassium 4.0 3.5 - 5.1 mmol/L   Chloride 105 98 - 111 mmol/L   CO2 25 22 - 32 mmol/L   Glucose, Bld 93 70 - 99 mg/dL   BUN 7 6 - 20 mg/dL   Creatinine, Ser 0.90 0.61 - 1.24 mg/dL   Calcium 8.9 8.9 - 10.3 mg/dL   Total Protein 6.6 6.5 - 8.1 g/dL   Albumin 3.7 3.5 - 5.0 g/dL   AST 18 15 - 41 U/L   ALT 26 0 - 44 U/L   Alkaline Phosphatase 53 38 - 126 U/L   Total Bilirubin 1.3 (H) 0.3 - 1.2 mg/dL   GFR calc non Af Amer >60 >60 mL/min   GFR calc Af Amer >60 >60 mL/min  Anion gap 8 5 - 15  CBC  Result Value Ref Range   WBC 9.5 4.0 - 10.5 K/uL   RBC 4.76 4.22 - 5.81 MIL/uL   Hemoglobin 14.3 13.0 - 17.0 g/dL   HCT 42.5 39.0 - 52.0 %   MCV 89.3 80.0 - 100.0 fL   MCH 30.0 26.0 - 34.0 pg   MCHC 33.6 30.0 - 36.0 g/dL   RDW 12.3 11.5 - 15.5 %   Platelets 355 150 - 400 K/uL   nRBC 0.0 0.0 - 0.2 %  Comprehensive metabolic panel  Result Value Ref Range   Sodium 133 (L) 135 - 145 mmol/L   Potassium 4.3 3.5 - 5.1 mmol/L   Chloride 102 98 - 111 mmol/L   CO2 23 22 - 32 mmol/L   Glucose, Bld 126 (H) 70 - 99 mg/dL   BUN 10 6 - 20 mg/dL    Creatinine, Ser 0.89 0.61 - 1.24 mg/dL   Calcium 8.6 (L) 8.9 - 10.3 mg/dL   Total Protein 6.3 (L) 6.5 - 8.1 g/dL   Albumin 3.5 3.5 - 5.0 g/dL   AST 43 (H) 15 - 41 U/L   ALT 52 (H) 0 - 44 U/L   Alkaline Phosphatase 51 38 - 126 U/L   Total Bilirubin 1.1 0.3 - 1.2 mg/dL   GFR calc non Af Amer >60 >60 mL/min   GFR calc Af Amer >60 >60 mL/min   Anion gap 8 5 - 15  Magnesium  Result Value Ref Range   Magnesium 2.0 1.7 - 2.4 mg/dL  Phosphorus  Result Value Ref Range   Phosphorus 3.8 2.5 - 4.6 mg/dL  CBC with Differential/Platelet  Result Value Ref Range   WBC 13.9 (H) 4.0 - 10.5 K/uL   RBC 4.56 4.22 - 5.81 MIL/uL   Hemoglobin 13.7 13.0 - 17.0 g/dL   HCT 40.8 39.0 - 52.0 %   MCV 89.5 80.0 - 100.0 fL   MCH 30.0 26.0 - 34.0 pg   MCHC 33.6 30.0 - 36.0 g/dL   RDW 12.1 11.5 - 15.5 %   Platelets 380 150 - 400 K/uL   nRBC 0.0 0.0 - 0.2 %   Neutrophils Relative % 86 %   Neutro Abs 11.9 (H) 1.7 - 7.7 K/uL   Lymphocytes Relative 8 %   Lymphs Abs 1.1 0.7 - 4.0 K/uL   Monocytes Relative 6 %   Monocytes Absolute 0.8 0.1 - 1.0 K/uL   Eosinophils Relative 0 %   Eosinophils Absolute 0.0 0.0 - 0.5 K/uL   Basophils Relative 0 %   Basophils Absolute 0.0 0.0 - 0.1 K/uL   Immature Granulocytes 0 %   Abs Immature Granulocytes 0.06 0.00 - 0.07 K/uL  Comprehensive metabolic panel  Result Value Ref Range   Sodium 137 135 - 145 mmol/L   Potassium 4.0 3.5 - 5.1 mmol/L   Chloride 105 98 - 111 mmol/L   CO2 24 22 - 32 mmol/L   Glucose, Bld 94 70 - 99 mg/dL   BUN 7 6 - 20 mg/dL   Creatinine, Ser 0.88 0.61 - 1.24 mg/dL   Calcium 8.4 (L) 8.9 - 10.3 mg/dL   Total Protein 6.0 (L) 6.5 - 8.1 g/dL   Albumin 3.3 (L) 3.5 - 5.0 g/dL   AST 24 15 - 41 U/L   ALT 40 0 - 44 U/L   Alkaline Phosphatase 45 38 - 126 U/L   Total Bilirubin 0.9 0.3 - 1.2 mg/dL   GFR calc non  Af Amer >60 >60 mL/min   GFR calc Af Amer >60 >60 mL/min   Anion gap 8 5 - 15  Magnesium  Result Value Ref Range   Magnesium 2.0 1.7 - 2.4  mg/dL  Phosphorus  Result Value Ref Range   Phosphorus 2.8 2.5 - 4.6 mg/dL  CBC with Differential/Platelet  Result Value Ref Range   WBC 11.6 (H) 4.0 - 10.5 K/uL   RBC 4.13 (L) 4.22 - 5.81 MIL/uL   Hemoglobin 12.5 (L) 13.0 - 17.0 g/dL   HCT 37.4 (L) 39.0 - 52.0 %   MCV 90.6 80.0 - 100.0 fL   MCH 30.3 26.0 - 34.0 pg   MCHC 33.4 30.0 - 36.0 g/dL   RDW 12.5 11.5 - 15.5 %   Platelets 305 150 - 400 K/uL   nRBC 0.0 0.0 - 0.2 %   Neutrophils Relative % 68 %   Neutro Abs 7.8 (H) 1.7 - 7.7 K/uL   Lymphocytes Relative 22 %   Lymphs Abs 2.6 0.7 - 4.0 K/uL   Monocytes Relative 8 %   Monocytes Absolute 0.9 0.1 - 1.0 K/uL   Eosinophils Relative 2 %   Eosinophils Absolute 0.2 0.0 - 0.5 K/uL   Basophils Relative 0 %   Basophils Absolute 0.0 0.0 - 0.1 K/uL   Immature Granulocytes 0 %   Abs Immature Granulocytes 0.05 0.00 - 0.07 K/uL  Comprehensive metabolic panel  Result Value Ref Range   Sodium 134 (L) 135 - 145 mmol/L   Potassium 3.8 3.5 - 5.1 mmol/L   Chloride 101 98 - 111 mmol/L   CO2 25 22 - 32 mmol/L   Glucose, Bld 96 70 - 99 mg/dL   BUN 5 (L) 6 - 20 mg/dL   Creatinine, Ser 0.93 0.61 - 1.24 mg/dL   Calcium 8.4 (L) 8.9 - 10.3 mg/dL   Total Protein 6.0 (L) 6.5 - 8.1 g/dL   Albumin 3.2 (L) 3.5 - 5.0 g/dL   AST 16 15 - 41 U/L   ALT 29 0 - 44 U/L   Alkaline Phosphatase 45 38 - 126 U/L   Total Bilirubin 1.4 (H) 0.3 - 1.2 mg/dL   GFR calc non Af Amer >60 >60 mL/min   GFR calc Af Amer >60 >60 mL/min   Anion gap 8 5 - 15  Magnesium  Result Value Ref Range   Magnesium 1.9 1.7 - 2.4 mg/dL  Phosphorus  Result Value Ref Range   Phosphorus 4.0 2.5 - 4.6 mg/dL  CBC with Differential/Platelet  Result Value Ref Range   WBC 14.9 (H) 4.0 - 10.5 K/uL   RBC 4.42 4.22 - 5.81 MIL/uL   Hemoglobin 13.2 13.0 - 17.0 g/dL   HCT 39.7 39.0 - 52.0 %   MCV 89.8 80.0 - 100.0 fL   MCH 29.9 26.0 - 34.0 pg   MCHC 33.2 30.0 - 36.0 g/dL   RDW 12.3 11.5 - 15.5 %   Platelets 318 150 - 400 K/uL   nRBC  0.0 0.0 - 0.2 %   Neutrophils Relative % 75 %   Neutro Abs 11.1 (H) 1.7 - 7.7 K/uL   Lymphocytes Relative 14 %   Lymphs Abs 2.1 0.7 - 4.0 K/uL   Monocytes Relative 9 %   Monocytes Absolute 1.3 (H) 0.1 - 1.0 K/uL   Eosinophils Relative 2 %   Eosinophils Absolute 0.3 0.0 - 0.5 K/uL   Basophils Relative 0 %   Basophils Absolute 0.1 0.0 - 0.1 K/uL   Immature  Granulocytes 0 %   Abs Immature Granulocytes 0.05 0.00 - 0.07 K/uL  Surgical pathology  Result Value Ref Range   SURGICAL PATHOLOGY      SURGICAL PATHOLOGY CASE: [id number] PATIENT: Burton Apley Surgical Pathology Report     Clinical History: Acute cholecystitis, acute sigmoid diverticulitis (crm)     FINAL MICROSCOPIC DIAGNOSIS:  A. GALLBLADDER, CHOLECYSTECTOMY: - Acute and chronic cholecystitis. - Attached hepatic parenchyma. - There is no evidence of malignancy.   GROSS DESCRIPTION:  Size/?Intact: 7.3 x 3.6 x 1.8 cm previously opened gallbladder, received fresh Serosal surface: Smooth and hyperemic Mucosa/Wall: The mucosa is glistening tan-red.  The wall measures up to 0.6 cm in thickness. Contents: There are no contents.  There are no calculi present in the gallbladder or in the specimen container. Cystic duct: Patent Block Summary: 1 block submitted (GRP 04/30/2020)    Final Diagnosis performed by Enid Cutter, MD.   Electronically signed 05/01/2020 Technical component performed at [facility] [address] [place], [place] [address].  Professional co mponent performed at [employer]. [facility], [place] [address] [place], [place] [address].  Immunohistochemistry Technical component (if applicable) was performed at [facility]. [address] [place], [place], [place] [address].   IMMUNOHISTOCHEMISTRY DISCLAIMER (if applicable): Some of these immunohistochemical stains may have been developed and the performance characteristics determine by [facility]. Some may not have been cleared or approved by the U.S. Food and Drug Administration. The FDA has determined that such clearance or approval is not necessary. This test is used for clinical purposes. It should not be regarded as investigational or for research. This laboratory is certified under the [employer] (CLIA-88) as qualified to perform high complexity clinical laboratory testing.  The controls stained appropriately.   Troponin I (High Sensitivity)  Result Value Ref Range   Troponin I (High Sensitivity) 3 <18 ng/L    DG Cholangiogram Operative  Result Date: 04/30/2020 CLINICAL DATA:  49 year old male undergoing laparoscopic cholecystectomy. EXAM: INTRAOPERATIVE CHOLANGIOGRAM TECHNIQUE: Cholangiographic images from the C-arm fluoroscopic device were submitted for interpretation post-operatively. Please see the procedural report for the amount of contrast and the fluoroscopy time utilized. COMPARISON:  None. FINDINGS: A single cine clip demonstrates cannulation of the cystic duct remanent and intraoperative cholangiogram. There is no evidence of biliary ductal dilatation, stenosis, stricture or choledocholithiasis. Contrast material refluxes into the distal aspect of the main pancreatic duct. Contrast passes freely through the ampulla and into the duodenum. IMPRESSION: Negative intraoperative cholangiogram. Electronically Signed   By: Jacqulynn Cadet M.D.   On: 04/30/2020 17:05   CT ABDOMEN PELVIS W CONTRAST  Result Date: 04/29/2020 CLINICAL DATA:  Abdominal pain, diverticulitis EXAM: CT ABDOMEN AND PELVIS WITH CONTRAST TECHNIQUE: Multidetector CT imaging of the abdomen and pelvis was performed using the standard protocol following bolus administration of intravenous contrast. CONTRAST:  159mL OMNIPAQUE IOHEXOL 300 MG/ML  SOLN COMPARISON:  04/13/2020 FINDINGS: Lower chest: No acute pleural or parenchymal lung disease Hepatobiliary:  Diffuse hepatic steatosis. No focal abnormality. The gallbladder is unremarkable. Pancreas: Unremarkable. No pancreatic ductal dilatation or surrounding inflammatory changes. Spleen: Normal in size without focal abnormality. Adrenals/Urinary Tract: Adrenal glands are unremarkable. Kidneys are normal, without renal calculi, focal lesion, or hydronephrosis. Bladder is unremarkable. Stomach/Bowel: Wall thickening and pericolonic fat stranding within the sigmoid colon again noted unchanged. There is a small gas/fluid collection in the left lower quadrant measuring 3.0 x 2.7 cm consistent with focal contained perforation.  No evidence of peripheral enhancement or abscess at this time. No bowel obstruction or ileus. Normal appendix right lower quadrant. Vascular/Lymphatic: Aortic atherosclerosis. No enlarged abdominal or pelvic lymph nodes. Reproductive: Prostate is unremarkable. Other: No abdominal wall hernia or abnormality. No abdominopelvic ascites. Musculoskeletal: No acute or destructive bony lesions. Reconstructed images demonstrate no additional findings. IMPRESSION: 1. Continued findings of acute sigmoid diverticulitis. There is continued focal contained perforation, with small gas/fluid collection as above. No evidence of well-formed abscess at this time. 2. Hepatic steatosis. 3.  Aortic Atherosclerosis (ICD10-I70.0). Electronically Signed   By: Randa Ngo M.D.   On: 04/29/2020 02:23   DG Abd 2 Views  Result Date: 04/15/2020 CLINICAL DATA:  Abdominal pain after bowel movement today. Recent diagnosis of diverticulitis with micro perforation. EXAM: ABDOMEN - 2 VIEW COMPARISON:  CT 04/13/2020 FINDINGS: No free air under the hemidiaphragms. Small foci of extraluminal gas adjacent to the sigmoid colon on CT are not well demonstrated radiographically. Air-filled small bowel in the central abdomen that is prominent with scattered air-fluid levels. Mild gaseous distension of ascending, transverse, and proximal  descending colon. Stable osseous structures. IMPRESSION: 1. No free air under the hemidiaphragms. Extraluminal air adjacent to sigmoid colon on recent CT is not well demonstrated radiographically. 2. Scattered air-fluid levels within prominent small bowel likely reactive ileus. Mild gaseous distension of transverse colon. Electronically Signed   By: Keith Rake M.D.   On: 04/15/2020 20:30   CT Renal Stone Study  Result Date: 04/13/2020 CLINICAL DATA:  Abdominal pain unspecified. EXAM: CT ABDOMEN AND PELVIS WITHOUT CONTRAST TECHNIQUE: Multidetector CT imaging of the abdomen and pelvis was performed following the standard protocol without IV contrast. COMPARISON:  09/06/2019 FINDINGS: Lower chest: Incidental imaging of the lung bases is unremarkable. Hepatobiliary: Moderate to severe hepatic steatosis. No focal lesion. Lobular hepatic contours. No pericholecystic stranding. No biliary ductal dilation. Pancreas: Pancreas is normal. No signs ductal dilation or inflammation. Spleen: Spleen normal size without focal lesion. Adrenals/Urinary Tract: Adrenal glands are normal. Renal contours are smooth. No hydronephrosis. No nephrolithiasis. Urinary bladder is normal. Stomach/Bowel: Small bowel with normal appearance, stomach with normal appearance. Appendix is normal. Focal perforation, associated with small diverticulum in the mid sigmoid colon in the LEFT lower quadrant. Associated colonic thickening. Minimal diverticulosis elsewhere. Locules of extraluminal gas track into the LEFT retroperitoneum and sigmoid mesentery. There is fascial thickening along the LEFT hemipelvis. No abscess. No gas in the peritoneum proper. No omental stranding. Vascular/Lymphatic: Calcified atheromatous plaque minimal in the abdominal aorta. No evidence of aneurysmal dilation. No adenopathy. No sign of pelvic lymphadenopathy. Prostate with calcifications. Reproductive: As above Other: Moderate fat containing inguinal hernias. Small  fat containing umbilical hernia. Musculoskeletal: No acute musculoskeletal process. IMPRESSION: 1. Focal perforation, associated with small diverticulum in the mid sigmoid colon with locules of extraluminal gas track into the LEFT retroperitoneum and sigmoid mesentery. No abscess. No generalized peritoneal stranding at this time. 2. Moderate to severe hepatic steatosis. Lobular hepatic contours, correlate with any clinical or laboratory evidence of liver disease. 3. Moderate fat containing inguinal hernias and small fat containing umbilical hernia. 4. Aortic atherosclerosis. Critical Value/emergent results were called by telephone at the time of interpretation on 04/13/2020 at 11:05 am to provider [facility] , who verbally acknowledged these results. Aortic Atherosclerosis (ICD10-I70.0). Electronically Signed   By: Zetta Bills M.D.   On: 04/13/2020 11:06   US Abdomen Limited RUQ  Result Date: 04/29/2020 CLINICAL DATA:  Right upper quadrant pain EXAM: ULTRASOUND ABDOMEN LIMITED  RIGHT UPPER QUADRANT COMPARISON:  04/29/2020 FINDINGS: Gallbladder: No gallstones identified. Gallbladder sludge and pericholecystic fluid identified. The gallbladder wall appears thickened measuring up to 4 mm. Common bile duct: Diameter: 4.8 mm Liver: Increased echogenicity. No focal abnormality. Portal vein is patent on color Doppler imaging with normal direction of blood flow towards the liver. Other: None. IMPRESSION: 1. Mild gallbladder wall thickening, pericholecystic fluid and gallbladder sludge. Cannot rule out acute acalculous cholecystitis. 2. Echogenic liver compatible with hepatic steatosis. Electronically Signed   By: Kerby Moors M.D.   On: 04/29/2020 11:51    Discharge Exam:  Vitals:   05/02/20 2149 05/03/20 0524  BP: 135/76 (!) 141/98  Pulse: 61 71  Resp: 15 15  Temp: 98.3 F (36.8 C) 98.8 F (37.1 C)  SpO2: 94% 97%    General: Obese WM who is alert and generally healthy appearing.  Lungs:  Clear to auscultation and symmetric breath sounds. Heart:  RRR. No murmur or rub. Abdomen: Soft. No mass.  Normal bowel sounds.  Incisions are covered.  He has a drain out of his RUQ.  Only 40 cc recorded over the last 24 hours.  Discharge Medications:   Allergies as of 05/03/2020      Reactions   Hctz [hydrochlorothiazide] Rash   Per derm rec stay off HCTZ (2018)      Medication List    STOP taking these medications   amoxicillin-clavulanate 875-125 MG tablet Commonly known as: AUGMENTIN     TAKE these medications   albuterol 108 (90 Base) MCG/ACT inhaler Commonly known as: Ventolin HFA Inhale 2 puffs into the lungs every 4 (four) hours as needed for wheezing or shortness of breath.   amLODipine 10 MG tablet Commonly known as: NORVASC Take 1 tablet (10 mg total) by mouth daily.   cetirizine 10 MG tablet Commonly known as: ZYRTEC Take 10 mg by mouth daily.   ciprofloxacin 500 MG tablet Commonly known as: Cipro Take 1 tablet (500 mg total) by mouth 2 (two) times daily.   diclofenac sodium 1 % Gel Commonly known as: VOLTAREN Apply 2 g topically 4 (four) times daily.   meloxicam 15 MG tablet Commonly known as: MOBIC TAKE 1 TABLET BY MOUTH EVERY DAY   metoprolol succinate 50 MG 24 hr tablet Commonly known as: TOPROL-XL Take 1 tablet (50 mg total) by mouth daily. Take with or immediately following a meal.   metroNIDAZOLE 500 MG tablet Commonly known as: Flagyl Take 1 tablet (500 mg total) by mouth 3 (three) times daily for 10 days.   multivitamin with minerals Tabs tablet Take 1 tablet by mouth daily.   omega-3 acid ethyl esters 1 g capsule Commonly known as: LOVAZA Take 1 g by mouth daily.   oxyCODONE 5 MG immediate release tablet Commonly known as: Oxy IR/ROXICODONE Take 1 tablet (5 mg total) by mouth every 6 (six) hours as needed for severe pain.   pantoprazole 40 MG tablet Commonly known as: PROTONIX Take 1 tablet by mouth 2 (two) times daily.        Disposition: Discharge disposition: 01-Home or Self Care       Discharge Instructions    Diet - low sodium heart healthy   Complete by: As directed    Increase activity slowly   Complete by: As directed       Follow-up Information    Michael Boston, MD. Go on 05/13/2020.   Specialty: General Surgery Why: Please arrive at 11:00am for your appointment Bring photo ID and insurance  information. Contact information: [address] [place] [address] [phone]            Signed: Alphonsa Overall, M.D., [facility] Surgery Office:  [phone]  05/03/2020, 12:38 PM

## 2020-05-03 NOTE — Discharge Instructions (Signed)
DRAIN CARE:   You have a closed bulb drain to help you heal.  A bulb drain is a small, plastic reservoir which creates a gentle suction. It is used to remove excess fluid from a surgical wound. The color and amount of fluid will vary. Immediately after surgery, the fluid is bright red. It may gradually change to a yellow color. When the amount decreases to about 1 or 2 tablespoons (15 to 30 cc) per 24 hours, your caregiver will usually remove it. JP Care  The Jackson-Pratt drainage system has flexible tubing attached to a soft, plastic bulb with a stopper. The drainage end of the tubing, which is flat and white, goes into your body through a small opening near your incision (surgical cut). A stitch holds the drainage end in place. The rest of the tube is outside your body, attached to the bulb. When the bulb is compressed with the stopper in place, it creates a vacuum. This causes a constant gentle suction, which helps draw out fluid that collects under your incision. The bulb should be compressed at all times, except when you are emptying the drainage.  How long you will have your Jackson-Pratt depends on your surgery and the amount of fluid is draining. This is different for everyone. The Jackson-Pratt is usually removed when the drainage is 30 mL or less over 24 hours. To keep track of how much drainage you're having, you will record the amount in a drainage log. It's important to bring the log with you to your follow-up appointments.  Caring for Your Jackson-Pratt at Home In order to care for your Jackson-Pratt at home, you or your caregiver will do the following:  Empty the drain once a day and record the color and amount of drainage  Care for the area where the tubing enters your skin by washing with soap and water.  Milk the tubing to help move clots into the bulb.  Do this before you empty and measure your drainage. Look in the mirror at the tubing. This will help you see where your hands  need to be. Pinch the tubing close to where it goes into your skin between your thumb and forefinger. With the thumb and forefinger of your other hand, pinch the tubing right below your other fingers. Keep your fingers pinched and slide them down the tubing, pushing any clots down toward the bulb. You may want to use alcohol swabs to help you slide your fingers down the tubing. Repeat steps 3 and 4 as necessary to push clots from the tubing into the bulb. If you are not able to move a clot into the bulb, call your doctor's office. The fluid may leak around the insertion site if a clot is blocking the drainage flow. If there is fluid in the bulb and no leakage at the insertion site, the drain is working.  How to Empty Your Jackson-Pratt and Record the Drainage You will need to empty your Jackson-Pratt every day  Gather the following supplies:  Measuring container your nurse gave you Jackson-Pratt Drainage Record  Pen or pencil  Instructions Clean an area to work on. Clean your hands thoroughly. Unplug the stopper on top of your Jackson-Pratt. This will cause the bulb to expand. Do not touch the inside of the stopper or the inner area of the opening on the bulb. Turn your Jackson-Pratt upside down, gently squeeze the bulb, and pour the drainage into the measuring container. Turn your Jackson-Pratt right side up. Squeeze  the bulb until your fingers feel the palm of your hand. Keep squeezing the bulb while you replug the stopper. Make sure the bulb stays fully compressed to ensure constant, gentle suction.    Check the amount and color of drainage in the measuring container. The first couple days after surgery the fluid may be dark red. This is normal. As you heal the fluid may look pink or pale yellow. Record this amount and the color of drainage on your Jackson-Pratt Drainage Record. Flush the drainage down the toilet and rinse the measuring container with water.  Caring for the  Insertion Site  Once you have emptied the drainage, clean your hands again. Check the area around the insertion site. Look for tenderness, swelling, or pus. If you have any of these, or if you have a temperature of 101 F (38.3 C) or higher, you may have an infection. Call your doctor's office.  Sometimes, the drain causes redness the size of a dime at your insertion site. This is normal. Your healthcare provider will tell you if you should place a bandage over the insertion site.  Wash drain site with soap & water (dilute hydrogen peroxide PRN) daily & replace clean dressing / tape   DAILY CARE  Keep the bulb compressed at all times, except while emptying it. The compression creates suction.   Keep sites where the tubes enter the skin dry and covered with a light bandage (dressing).   Tape the tubes to your skin, 1 to 2 inches below the insertion sites, to keep from pulling on your stitches. Tubes are stitched in place and will not slip out.   Pin the bulb to your shirt (not to your pants) with a safety pin.   For the first few days after surgery, there usually is more fluid in the bulb. Empty the bulb whenever it becomes half full because the bulb does not create enough suction if it is too full. Include this amount in your 24 hour totals.   When the amount of drainage decreases, empty the bulb at the same time every day. Write down the amounts and the 24 hour totals. Your caregiver will want to know them. This helps your caregiver know when the tubes can be removed.   (We anticipate removing the drain in 1-3 weeks, depending on when the output is <48mL a day for 2+ days)  If there is drainage around the tube sites, change dressings and keep the area dry. If you see a clot in the tube, leave it alone. However, if the tube does not appear to be draining, let your caregiver know.  TO EMPTY THE BULB  Open the stopper to release suction.   Holding the stopper out of the way, pour  drainage into the measuring cup that was sent home with you.   Measure and write down the amount. If there are 2 bulbs, note the amount of drainage from bulb 1 or bulb 2 and keep the totals separate. Your caregiver will want to know which tube is draining more.   Compress the bulb by folding it in half.   Replace the stopper.   Check the tape that holds the tube to your skin, and pin the bulb to your shirt.  SEEK MEDICAL CARE IF:  The drainage develops a bad odor.   You have an oral temperature above 102 F (38.9 C).   The amount of drainage from your wound suddenly increases or decreases.   You accidentally pull  out your drain.   You have any other questions or concerns.  MAKE SURE YOU:   Understand these instructions.   Will watch your condition.   Will get help right away if you are not doing well or get worse.     Call our office if you have any questions about your drain. [phone]     Diverticulitis  Diverticulitis is infection or inflammation of small pouches (diverticula) in the colon that form due to a condition called diverticulosis. Diverticula can trap stool (feces) and bacteria, causing infection and inflammation. Diverticulitis may cause severe stomach pain and diarrhea. It may lead to tissue damage in the colon that causes bleeding. The diverticula may also burst (rupture) and cause infected stool to enter other areas of the abdomen. Complications of diverticulitis can include:  Bleeding.  Severe infection.  Severe pain.  Rupture (perforation) of the colon.  Blockage (obstruction) of the colon. What are the causes? This condition is caused by stool becoming trapped in the diverticula, which allows bacteria to grow in the diverticula. This leads to inflammation and infection. What increases the risk? You are more likely to develop this condition if:  You have diverticulosis. The risk for diverticulosis increases if: ? You are overweight or  obese. ? You use tobacco products. ? You do not get enough exercise.  You eat a diet that does not include enough fiber. High-fiber foods include fruits, vegetables, beans, nuts, and whole grains. What are the signs or symptoms? Symptoms of this condition may include:  Pain and tenderness in the abdomen. The pain is normally located on the left side of the abdomen, but it may occur in other areas.  Fever and chills.  Bloating.  Cramping.  Nausea.  Vomiting.  Changes in bowel routines.  Blood in your stool. How is this diagnosed? This condition is diagnosed based on:  Your medical history.  A physical exam.  Tests to make sure there is nothing else causing your condition. These tests may include: ? Blood tests. ? Urine tests. ? Imaging tests of the abdomen, including X-rays, ultrasounds, MRIs, or CT scans. How is this treated? Most cases of this condition are mild and can be treated at home. Treatment may include:  Taking over-the-counter pain medicines.  Following a clear liquid diet.  Taking antibiotic medicines by mouth.  Rest. More severe cases may need to be treated at a hospital. Treatment may include:  Not eating or drinking.  Taking prescription pain medicine.  Receiving antibiotic medicines through an IV tube.  Receiving fluids and nutrition through an IV tube.  Surgery. When your condition is under control, your health care provider may recommend that you have a colonoscopy. This is an exam to look at the entire large intestine. During the exam, a lubricated, bendable tube is inserted into the anus and then passed into the rectum, colon, and other parts of the large intestine. A colonoscopy can show how severe your diverticula are and whether something else may be causing your symptoms. Follow these instructions at home: Medicines  Take over-the-counter and prescription medicines only as told by your health care provider. These include fiber  supplements, probiotics, and stool softeners.  If you were prescribed an antibiotic medicine, take it as told by your health care provider. Do not stop taking the antibiotic even if you start to feel better.  Do not drive or use heavy machinery while taking prescription pain medicine. General instructions   Follow a full liquid diet or another  diet as directed by your health care provider. After your symptoms improve, your health care provider may tell you to change your diet. He or she may recommend that you eat a diet that contains at least 25 g (25 grams) of fiber daily. Fiber makes it easier to pass stool. Healthy sources of fiber include: ? Berries. One cup contains 4-8 grams of fiber. ? Beans or lentils. One half cup contains 5-8 grams of fiber. ? Green vegetables. One cup contains 4 grams of fiber.  Exercise for at least 30 minutes, 3 times each week. You should exercise hard enough to raise your heart rate and break a sweat.  Keep all follow-up visits as told by your health care provider. This is important. You may need a colonoscopy.  Get help right away if:  Your pain gets worse.  Your symptoms do not get better with treatment.  Your symptoms suddenly get worse.  You have a fever.  You vomit more than one time.  You have stools that are bloody, black, or tarry. Summary  Diverticulitis is infection or inflammation of small pouches (diverticula) in the colon that form due to a condition called diverticulosis. Diverticula can trap stool (feces) and bacteria, causing infection and inflammation.  You are at higher risk for this condition if you have diverticulosis and you eat a diet that does not include enough fiber.  Most cases of this condition are mild and can be treated at home. More severe cases may need to be treated at a hospital.  When your condition is under control, your health care provider may recommend that you have an exam called a colonoscopy. This exam can  show how severe your diverticula are and whether something else may be causing your symptoms. This information is not intended to replace advice given to you by your health care provider. Make sure you discuss any questions you have with your health care provider.  [facility] - DISCHARGE INSTRUCTIONS TO PATIENT  Activity:  Driving - May drive when off pain meds                       Practice your Covid-19 protection:  Wear a mask, social distance, and wash your hands frequently  Diet:  Low residue diet  Follow up appointment:  Call Dr. Clyda Greener office [facility]) at [phone] for an appointment in 2 weeks.  Medications and dosages:  Resume your home medications.  You have a prescription for:  Cipro, Flagyl, oxycodone  Call Dr. [name] or his office  [phone]) if you have:  Temperature greater than 100.4,  Persistent nausea and vomiting,  Severe uncontrolled pain,  Any other questions or concerns you may have after discharge.  In an emergency, call 911 or go to an Emergency Department at a nearby hospital

## 2020-05-03 NOTE — Plan of Care (Signed)
  Problem: Clinical Measurements: Goal: Ability to maintain clinical measurements within normal limits will improve Outcome: Progressing Goal: Will remain free from infection Outcome: Progressing Goal: Diagnostic test results will improve Outcome: Progressing Goal: Respiratory complications will improve Outcome: Progressing   Problem: Activity: Goal: Risk for activity intolerance will decrease Outcome: Progressing   Problem: Nutrition: Goal: Adequate nutrition will be maintained Outcome: Progressing   Problem: Coping: Goal: Level of anxiety will decrease Outcome: Progressing   Problem: Elimination: Goal: Will not experience complications related to bowel motility Outcome: Progressing   Problem: Pain Managment: Goal: General experience of comfort will improve Outcome: Progressing   Problem: Safety: Goal: Ability to remain free from injury will improve Outcome: Progressing   Problem: Skin Integrity: Goal: Risk for impaired skin integrity will decrease Outcome: Progressing   Problem: Clinical Measurements: Goal: Postoperative complications will be avoided or minimized Outcome: Progressing   Problem: Skin Integrity: Goal: Demonstration of wound healing without infection will improve Outcome: Progressing

## 2020-05-06 ENCOUNTER — Encounter: Payer: Self-pay | Admitting: Family Medicine

## 2020-05-07 ENCOUNTER — Telehealth: Payer: BC Managed Care – PPO | Admitting: Family Medicine

## 2020-05-07 ENCOUNTER — Other Ambulatory Visit: Payer: Self-pay

## 2020-05-07 NOTE — Telephone Encounter (Signed)
Spoke with pt scheduling MyChart video visit today with Dr. Glori Bickers at 2:00.

## 2020-05-09 DIAGNOSIS — D72829 Elevated white blood cell count, unspecified: Secondary | ICD-10-CM | POA: Diagnosis not present

## 2020-05-09 DIAGNOSIS — K5720 Diverticulitis of large intestine with perforation and abscess without bleeding: Secondary | ICD-10-CM | POA: Diagnosis not present

## 2020-05-09 DIAGNOSIS — Z8719 Personal history of other diseases of the digestive system: Secondary | ICD-10-CM | POA: Diagnosis not present

## 2020-05-09 DIAGNOSIS — Z9049 Acquired absence of other specified parts of digestive tract: Secondary | ICD-10-CM | POA: Diagnosis not present

## 2020-05-13 ENCOUNTER — Encounter (HOSPITAL_COMMUNITY): Payer: Self-pay | Admitting: Surgery

## 2020-05-13 ENCOUNTER — Ambulatory Visit (HOSPITAL_COMMUNITY): Payer: Self-pay | Admitting: Surgery

## 2020-05-13 DIAGNOSIS — B370 Candidal stomatitis: Secondary | ICD-10-CM | POA: Insufficient documentation

## 2020-05-13 NOTE — H&P (Signed)
Jordan Buchanan Appointment: 05/13/2020 11:30 AM Location: [facility] Patient #: [id number] DOB: August 01, 1971 Married / Language: Jordan Buchanan / Race: White Male  History of Present Illness Jordan Hector MD; 05/13/2020 12:32 PM) The patient is a 49 year old male who presents with diverticulitis. Note for "Diverticulitis": ` ` ` The patient returns s/p laparoscopic cholecystectomy for acute cholecystitis.  Drainage of sigmoid abscess with washout for worsening diverticulitis with perforation and abscess. 04/30/2020      The patient returns to clinic after surgery, gradually improving. Patient diagnosed with diverticulitis with microperforation. Admitted with antibiotics. Then returned with upper abdominal pain and found to have cholecystitis. Progressive gas formation suspicious for worsening abscess. Therefore today warranted cholecystectomy. Opened up and drained and washed out his abscess and left a surgical drain. Improved and went home postoperative day #3  Tolerating a soft diet. He did have some complaints of plaques and oral burning suspicious for thrush. Nystatin swish and swallow ordered. Sent home on oral Cipro and Flagyl since he had progression of his diverticulitis with abscess on Augmentin. Pain from the incisions is fading away. Drain is putting out 10-15 millimeters day. Some old blood but more thin. His bowels are normalizing. His abdominal pain is gone down. Appetite and energy level improving. He is wondering if he can get back to doing some work but is hesitant to do unrestricted activity. Denies nausea, constipation/diarrhea, worsening fatigue, high fevers, or other concerns. `  Pathology: SURGICAL PATHOLOGY CASE: [id number] PATIENT: Jordan Buchanan Surgical Pathology Report     Clinical History: Acute cholecystitis, acute sigmoid diverticulitis (crm)     FINAL MICROSCOPIC DIAGNOSIS:  A. GALLBLADDER, CHOLECYSTECTOMY: - Acute and  chronic cholecystitis. - Attached hepatic parenchyma. - There is no evidence of malignancy.   Dael Howland DESCRIPTION:  Size/?Intact: 7.3 x 3.6 x 1.8 cm previously opened gallbladder, received fresh Serosal surface: Smooth and hyperemic Mucosa/Wall: The mucosa is glistening tan-red. The wall measures up to 0.6 cm in thickness. Contents: There are no contents. There are no calculi present in the gallbladder or in the specimen container. Cystic duct: Patent Block Summary: 1 block submitted (GRP 04/30/2020)    Final Diagnosis performed by Enid Cutter, MD. Electronically signed 05/01/2020 Technical component performed at [facility] [address], [place], [place] [address]. Professional component performed at [employer]. [facility], [place] [address] [place], [place] [address]. Immunohistochemistry Technical component (if applicable) was performed at [facility]. [address] [place], [place], [place] [address]. IMMUNOHISTOCHEMISTRY DISCLAIMER (if applicable): Some of these immunohistochemical stains may have been developed and the performance characteristics determine by [facility]. Some may not have been cleared or approved by the U.S. Food and Drug Administration. The FDA has determined that such clearance or approval is not necessary. This test is used for clinical purposes. It should not be regarded as investigational or for research. This laboratory is certified under the [place] (CLIA-88) as qualified to perform high complexity clinical laboratory testing. The controls stained appropriately. ` ` `   04/30/2020  PATIENT: Jordan Buchanan 49 y.o. male  [name], [name], [name], [name], [name], MD as  Consulting Physician (General Surgery)  PRE-OPERATIVE DIAGNOSIS:   Acute on Chronic Calculus Cholecystitis Sigmoid diverticulitis with abscess  POST-OPERATIVE DIAGNOSIS:  Acute on Chronic Calculus Cholecystitis Sigmoid diverticulitis with  abscess Liver: Fatty steatohepatitis  PROCEDURE: Laparoscopic cholecystectomy with intraoperative cholangiogram Sigmoid colon mobilization with drainage of abscess, washout, and drain placement  SURGEON: Jordan Hector, MD, FACS.  ASSISTANT: Fran Lowes, PA-S, [employer]  ANESTHESIA:  General with endotracheal intubation Local anesthetic as a field block  Nerve block provided with liposomal bupivacaine (Experel) mixed with 0.25% bupivacaine as a Bilateral TAP block x 73mL each side at the level of the transverse abdominis & preperitoneal spaces along the flank at the anterior axillary line, from subcostal ridge to iliac crest under laparoscopic guidance   EBL: (See Anesthesia Intraoperative Record) Total I/O In: 20 [I.V.:20] Out: 150 [Urine:100; Blood:50]   Delay start of Pharmacological VTE agent (>24hrs) due to surgical blood loss or risk of bleeding: no  DRAINS: 19 Fr Blake closed bulb drain in the right paramedian port site goes along left paracolic gutter over abscess cavity and down in pelvis.  SPECIMEN: Gallbladder   DISPOSITION OF SPECIMEN: PATHOLOGY  COUNTS: YES  PLAN OF CARE: Admit to inpatient  PATIENT DISPOSITION: PACU - hemodynamically stable.  INDICATION: Pleasant patient with diverticulitis with focal perforation. Initially improved on antibiotics. Then had worsening abdominal pain. Seem more epigastric. Almost like biliary colic. Ultrasound suspicious for cholecystitis. CT scan notes larger gas pocket with some possible suspicious for worsening abscess. I recommended laparoscopic cholecystectomy. Because he is not in particular shock or recommended laparoscopic drainage of abscess since no  great window for percutaneous drainage. Washout with drain placement. Allow the diverticulitis to get under better control for probable 1 stage operation and minimize Hartman resection with end colostomy and rectal stump this admission.  The anatomy & physiology of hepatobiliary & pancreatic function was discussed. The pathophysiology of gallbladder dysfunction was discussed. Natural history risks without surgery was discussed. I feel the risks of no intervention will lead to serious problems that outweigh the operative risks; therefore, I recommended cholecystectomy to remove the pathology. I explained laparoscopic techniques with possible need for an open approach. Probable cholangiogram to evaluate the bilary tract was explained as well.   Pathophysiology of diverticulitis with abscess was discussed. Admission made for laparoscopic mobilization and aspiration of abscess with probable drain placement.  Risks such as bleeding, infection, abscess, leak, injury to other organs, need for further treatment, heart attack, death, and other risks were discussed. Risk of need for colectomy/colostomy noted as well. I noted a good likelihood this will help address the problem. Possibility that this will not correct all abdominal symptoms was explained. Goals of post-operative recovery were discussed as well. We will work to minimize complications. An educational handout further explaining the pathology and treatment options was given as well. Questions were answered. The patient expresses understanding & wishes to proceed with surgery.  OR FINDINGS: Edematous inflamed gallbladder with some ischemia suspicious with acute on chronic cholecystitis.  Cholangiogram showing classic biliary anatomy without any obstruction or choledocholithiasis. No leak. Fatty change in liver with probable steatohepatitis  Thickened sigmoid colon with phlegmon & abscess in mesentery between retroperitoneum/psoas and  posterior sigmoid mesentery. Mainly gas in abscess pocket. Some purulence. Able to aspirate and break in cavity up with washout. Drain placed as noted above. No evidence of any feculent peritonitis at this time.  CASE DATA:  Type of patient?: LDOW CASE (Surgical Hospitalist [facility] Inpatient)  Status of Case? URGENT Add On  Infection Present At Time Of Surgery (PATOS)? ABSCESS, PHLEGMON   DESCRIPTION:  Informed consent was confirmed. The patient underwent general anaesthesia without difficulty. The patient  was positioned appropriately. VTE prevention in place. The patient's abdomen was clipped, prepped, & draped in a sterile fashion. Surgical timeout confirmed our plan.  Peritoneal entry with a laparoscopic port was obtained using optical entry technique in the right upper abdomen as the patient was positioned in reverse Trendelenburg. Entry was clean. I induced carbon dioxide insufflation. Camera inspection revealed no injury. Extra ports were carefully placed under direct laparoscopic visualization.  I turned attention to the right upper quadrant. Gallbladder was edematous with inflammation and some ischemia. Consistent with acute on chronic cholecystitis. I aspirated the gallbladder with a needle on the laparoscopic suction. This help decompress the gallbladder. Still rather thickened with this the gallbladder fundus was elevated cephalad. I used hook cautery to free the peritoneal coverings between the gallbladder and the liver on the posteriolateral and anteriomedial walls. I used careful blunt and hook dissection to help get a good critical view of the cystic artery and cystic duct. I did further dissection to free 50%of the gallbladder off the liver bed to get a good critical view of the infundibulum and cystic duct. I dissected out the cystic artery; and, after getting a good 360 view, ligated the anterior & posterior branches of the cystic artery close on the infundibulum  using clips and hook cautery on the gallbladder side..  I skeletonized the cystic duct. I placed a clip on the infundibulum. I did a partial cystic duct-otomy and ensured patency. I placed a 5 [place] cholangiocatheter through a puncture site at the right subcostal ridge of the abdominal wall and directed it into the cystic duct. We ran a cholangiogram with dilute radio-opaque contrast and continuous fluoroscopy. Contrast flowed from a side branch consistent with cystic duct cannulization. Contrast flowed up the common hepatic duct into the right and left intrahepatic chains out to secondary radicals. Contrast flowed down the common bile duct easily across the normal ampulla into the duodenum. This was consistent with a normal cholangiogram. I removed the cholangiocatheter. I placed clips on the cystic duct x4. I completed cystic duct transection. I freed the gallbladder from its remaining attachments to the liver. I ensured hemostasis on the gallbladder fossa of the liver and elsewhere. I inspected the rest of the abdomen & detected no injury nor bleeding elsewhere. Placed the gallbladder inside and ecosac bag. I removed the gallbladder inside the sac out the epigastric port.  Next we turned attention to the sigmoid diverticulitis. Patient placed in Trendelenburg positioning. Small bowel and great omentum reflected to expose the sigmoid colon. Was able to mobilize the left colon and lateral medial fashion starting at the uninflamed descending colon and coming more distally. Found an inflamed plane between the rectosigmoid colon and the retroperitoneum and mobilized it over. Inflamed phlegmonous area was able to probe into the sigmoid mesentery and break up some inflammation and purulence consistent with a abscess cavity. Did irrigation over a liter to help wash the abscess cavity out. Aspirated. Hemostasis good. Another irrigation of antibiotic solution (clindamycin/gentamicin). Placed a drain  on the right upper quadrant to go running along the left paracolic gutter and retroperitoneum along over the psoas with the tip down the pelvis. Allowed the sigmoid colon to roll back over it.  I closed the subxiphoid fascia transversely using 0 Vicryl interrupted stitches under laparoscopic visualization. Brought the greater omentum down allowed it to come down the pelvis to cover over the sigmoid colon and drain as well. Evacuated carbon dioxide and remove the ports. I closed the skin using  4-0 monocryl stitch. Sterile dressings were applied. The patient was extubated & arrived in the PACU in stable condition..  I had discussed postoperative care with the patient in the holding area. I made an attempt to locate family to discuss patient's status and recommendations. No one is available at this time. I will try again later   Jordan Buchanan, M.D., F.A.C.S. Gastrointestinal and Minimally Invasive Surgery [facility] [address], [place], [place] [address] [phone] Main / Paging  04/30/2020 12:03 PM   Past Surgical History (Chanel Teressa Senter, [place]; 05/13/2020 11:41 AM) Gallbladder Surgery - Laparoscopic Oral Surgery Shoulder Surgery Bilateral. Ventral / Umbilical Hernia Surgery Left.  Allergies (Chanel Teressa Senter, CMA; 05/13/2020 11:41 AM) No Known Drug Allergies [05/13/2020]: Allergies Reconciled  Medication History (Chanel Teressa Senter, CMA; 05/13/2020 11:42 AM) Metoprolol Succinate ER (50MG  Tablet ER 24HR, Oral) Active. amLODIPine Besylate (10MG  Tablet, Oral) Active. Medications Reconciled  Social History Antonietta Jewel, CMA; 05/13/2020 11:41 AM) Alcohol use Remotely quit alcohol use. Caffeine use Carbonated beverages, Coffee, Tea. Illicit drug use Remotely quit drug use. Tobacco use Former smoker.  Family History ([place], [place]; 05/13/2020 11:41 AM) Hypertension Father.  Other Problems (Chanel Teressa Senter, CMA; 05/13/2020 11:41  AM) Gastroesophageal Reflux Disease High blood pressure Sleep Apnea Umbilical Hernia Repair     Review of Systems (Chanel Nolan CMA; 05/13/2020 11:41 AM) General Not Present- Appetite Loss, Chills, Fatigue, Fever, Night Sweats, Weight Gain and Weight Loss. Skin Not Present- Change in Wart/Mole, Dryness, Hives, Jaundice, New Lesions, Non-Healing Wounds, Rash and Ulcer. HEENT Present- Seasonal Allergies and Wears glasses/contact lenses. Not Present- Earache, Hearing Loss, Hoarseness, Nose Bleed, Oral Ulcers, Ringing in the Ears, Sinus Pain, Sore Throat, Visual Disturbances and Yellow Eyes. Respiratory Not Present- Bloody sputum, Chronic Cough, Difficulty Breathing, Snoring and Wheezing. Breast Not Present- Breast Mass, Breast Pain, Nipple Discharge and Skin Changes. Cardiovascular Not Present- Chest Pain, Difficulty Breathing Lying Down, Leg Cramps, Palpitations, Rapid Heart Rate, Shortness of Breath and Swelling of Extremities. Gastrointestinal Present- Abdominal Pain and Change in Bowel Habits. Not Present- Bloating, Bloody Stool, Chronic diarrhea, Constipation, Difficulty Swallowing, Excessive gas, Gets full quickly at meals, Hemorrhoids, Indigestion, Nausea, Rectal Pain and Vomiting. Male Genitourinary Not Present- Blood in Urine, Change in Urinary Stream, Frequency, Impotence, Nocturia, Painful Urination, Urgency and Urine Leakage. Musculoskeletal Not Present- Back Pain, Joint Pain, Joint Stiffness, Muscle Pain, Muscle Weakness and Swelling of Extremities. Neurological Not Present- Decreased Memory, Fainting, Headaches, Numbness, Seizures, Tingling, Tremor, Trouble walking and Weakness. Psychiatric Not Present- Anxiety, Bipolar, Change in Sleep Pattern, Depression, Fearful and Frequent crying. Endocrine Not Present- Cold Intolerance, Excessive Hunger, Hair Changes, Heat Intolerance, Hot flashes and New Diabetes. Hematology Not Present- Blood Thinners, Easy Bruising, Excessive bleeding,  Gland problems, HIV and Persistent Infections.  Vitals (Chanel Nolan CMA; 05/13/2020 11:42 AM) 05/13/2020 11:42 AM Weight: 229.25 lb Height: 70in Body Surface Area: 2.21 m Body Mass Index: 32.89 kg/m  Temp.: 98.60F  Pulse: 89 (Regular)  BP: 132/82(Sitting, Left Arm, Standard)        Physical Exam Jordan Hector MD; 05/13/2020 12:30 PM)  General Mental Status-Alert. General Appearance-Not in acute distress, Not Sickly. Orientation-Oriented X3. Hydration-Well hydrated. Voice-Normal. Note: Calm. Moving normally. Not toxic. Not sickly. No guarding.  Integumentary Global Assessment Upon inspection and palpation of skin surfaces of the - Axillae: non-tender, no inflammation or ulceration, no drainage. and Distribution of scalp and body hair is normal. General Characteristics Temperature - normal warmth is noted.  Head and Neck Head-normocephalic, atraumatic  with no lesions or palpable masses. Face Global Assessment - atraumatic, no absence of expression. Neck Global Assessment - no abnormal movements, no bruit auscultated on the right, no bruit auscultated on the left, no decreased range of motion, non-tender. Trachea-midline. Thyroid Gland Characteristics - non-tender.  Eye Eyeball - Left-Extraocular movements intact, No Nystagmus - Left. Eyeball - Right-Extraocular movements intact, No Nystagmus - Right. Cornea - Left-No Hazy - Left. Cornea - Right-No Hazy - Right. Sclera/Conjunctiva - Left-No scleral icterus, No Discharge - Left. Sclera/Conjunctiva - Right-No scleral icterus, No Discharge - Right. Pupil - Left-Direct reaction to light normal. Pupil - Right-Direct reaction to light normal.  ENMT Ears Pinna - Left - no drainage observed, no generalized tenderness observed. Pinna - Right - no drainage observed, no generalized tenderness observed. Nose and Sinuses External Inspection of the Nose - no destructive lesion  observed. Inspection of the nares - Left - quiet respiration. Inspection of the nares - Right - quiet respiration. Mouth and Throat Lips - Upper Lip - no fissures observed, no pallor noted. Lower Lip - no fissures observed, no pallor noted. Nasopharynx - no discharge present. Oral Cavity/Oropharynx - Tongue - no dryness observed. Oral Mucosa - no cyanosis observed. Hypopharynx - no evidence of airway distress observed. Note: Oropharynx seems clear without any obvious plaques or pharyngitis.  Chest and Lung Exam Inspection Movements - Normal and Symmetrical. Accessory muscles - No use of accessory muscles in breathing. Palpation Palpation of the chest reveals - Non-tender. Auscultation Breath sounds - Normal and Clear.  Cardiovascular Auscultation Rhythm - Regular. Murmurs & Other Heart Sounds - Auscultation of the heart reveals - No Murmurs and No Systolic Clicks.  Abdomen Inspection Inspection of the abdomen reveals - No Visible peristalsis and No Abnormal pulsations. Umbilicus - No Bleeding, No Urine drainage. Palpation/Percussion Palpation and Percussion of the abdomen reveal - Soft, Non Tender, No Rebound tenderness, No Rigidity (guarding) and No Cutaneous hyperesthesia. Note: Abdomen soft. Nontender. Not distended. No umbilical or incisional hernias. No guarding. Laparoscopic incisions with normal healing ridges. No more lower quadrant or suprapubic pain  Drainage of right upper quadrant serosanguineous with low volume. 10-15 millimeters should day for the past week. I removed the drain. He tolerated well.  Male Genitourinary Sexual Maturity Tanner 5 - Adult hair pattern and Adult penile size and shape.  Peripheral Vascular Upper Extremity Inspection - Left - No Cyanotic nailbeds - Left, Not Ischemic. Inspection - Right - No Cyanotic nailbeds - Right, Not Ischemic.  Neurologic Neurologic evaluation reveals -normal attention span and ability to concentrate, able to name  objects and repeat phrases. Appropriate fund of knowledge , normal sensation and normal coordination. Mental Status Affect - not angry, not paranoid. Cranial Nerves-Normal Bilaterally. Gait-Normal.  Neuropsychiatric Mental status exam performed with findings of-able to articulate well with normal speech/language, rate, volume and coherence, thought content normal with ability to perform basic computations and apply abstract reasoning and no evidence of hallucinations, delusions, obsessions or homicidal/suicidal ideation.  Musculoskeletal Global Assessment Spine, Ribs and Pelvis - no instability, subluxation or laxity. Right Upper Extremity - no instability, subluxation or laxity.  Lymphatic Head & Neck  General Head & Neck Lymphatics: Bilateral - Description - No Localized lymphadenopathy. Axillary  General Axillary Region: Bilateral - Description - No Localized lymphadenopathy. Femoral & Inguinal  Generalized Femoral & Inguinal Lymphatics: Left - Description - No Localized lymphadenopathy. Right - Description - No Localized lymphadenopathy.    Assessment & Plan Jordan Hector MD; 05/13/2020 12:31 PM)  DIVERTICULITIS OF LARGE INTESTINE WITH PERFORATION WITHOUT BLEEDING (K57.20) Impression: Recovering status post remission with laparoscopic washout and drain of progressive diverticulitis abscess from sigmoid colon.  Complete Cipro and Flagyl. Last dose today. Restart if having worsening symptoms. CT scan repeat have worsened back on oral antibiotics.  Drain output was low-volume and serosanguineous and is markedly improved. Therefore I removed the drain.  Continue low fiber/soft diet this week. Can gradually advance to high-fiber diet until surgery. If worsening symptoms, stay on a low residue/soft diet until gets surgery.  At some point he would benefit from surgery to remove this problem area of his colon. Sigmoid colectomy for Identically would wait about 6 weeks from  last admission. That would allow a better chance of robotic minimally invasive sigmoid colectomy with immediate anastomosis, lowering the chance of conversion to open, need for colostomy, etc. abscess was primarily in the mesentery and not in the retroperitoneum, so I do not think we need to do any firefly or stenting or ostomy marking at this time.  Ideally he would get a colonoscopy the day before surgery to make sure there is no malignancy or other abnormalities. He is interested in getting this done in [place]. We'll try and set up with the [facility] group.  There hoping to try and get this done to further develop [place] disappears June 1. We will try and set up in late June if possible. I did caution them may not be possible. We will see.  Current Plans Instructed to schedule colonoscopywith a gastroenterologist You are being scheduled for surgery- Our schedulers will call you.  You should hear from our office's scheduling department within 5 working days about the location, date, and time of surgery. We try to make accommodations for patient's preferences in scheduling surgery, but sometimes the OR schedule or the surgeon's schedule prevents [place] from making those accommodations.  If you have not heard from our office [phone]) in 5 working days, call the office and ask for your surgeon's nurse.  If you have other questions about your diagnosis, plan, or surgery, call the office and ask for your surgeon's nurse.   ABSCESS OF SIGMOID COLON DUE TO DIVERTICULITIS (K57.20) Impression: Improved status post washout and drainage. I removed the drains and serosanguineous. Reassuring.   PREOP COLON - ENCOUNTER FOR PREOPERATIVE EXAMINATION FOR GENERAL SURGICAL PROCEDURE [id number])  Current Plans Written instructions provided The anatomy & physiology of the digestive tract was discussed. The pathophysiology of the colon was discussed. Natural history risks without surgery was  discussed. I feel the risks of no intervention will lead to serious problems that outweigh the operative risks; therefore, I recommended a partial colectomy to remove the pathology. Minimally invasive (Robotic/Laparoscopic) & open techniques were discussed.  Risks such as bleeding, infection, abscess, leak, reoperation, possible ostomy, hernia, heart attack, death, and other risks were discussed. I noted a good likelihood this will help address the problem. Goals of post-operative recovery were discussed as well. Need for adequate nutrition, daily bowel regimen and healthy physical activity, to optimize recovery was noted as well. We will work to minimize complications. Educational materials were available as well. Questions were answered. The patient expresses understanding & wishes to proceed with surgery.  Pt Education - [facility] Colon Bowel Prep 2018 ERAS/Miralax/Antibiotics Started Neomycin Sulfate 500 MG Oral Tablet, 2 (two) Tablet SEE NOTE, #6, 05/13/2020, No Refill. Local Order: Pharmacist Notes: TAKE TWO TABLETS AT 2 PM, 3 PM, AND 10 PM THE DAY PRIOR TO SURGERY Started Flagyl  500 MG Oral Tablet, 2 (two) Tablet SEE NOTE, #6, 05/13/2020, No Refill. Local Order: Pharmacist Notes: Take at 2pm, 3pm, and 10pm the day prior to your colon operation Pt Education - Pamphlet Given - Laparoscopic Colorectal Surgery: discussed with patient and provided information. Pt Education - [facility] Colectomy post-op instructions: discussed with patient and provided information.  ORAL THRUSH (B37.0) Impression: Some odynophagia with some partial improvement on nystatin. Due to short course of fluconazole to see if that will help turn things around. Lozenges and mouthwash as needed.  Markedly worse mass gastrology evaluated. Hopefully not too likely at this point  Current Plans Started Fluconazole 200 MG Oral Tablet, 1 (one) Tablet daily, 3 days starting 05/13/2020, Ref. x1.  ACUTE CHOLECYSTITIS WITH CHRONIC  CHOLECYSTITIS (K81.2) Impression: Allowing status post laparoscopic cholecystectomy for acute on chronic cholecystitis. Recovering status post lap Cholecystectomy for acute and chronic cholecystitis. Pathology benign.  Current Plans Pt Education - Education: Pathology Report given to patient Pt Education - Laparoscopic Cholecystectomy: gallbladder  Jordan Hector, MD, FACS, MASCRS Gastrointestinal and Minimally Invasive Surgery  [facility] [address], [place], [place] [address] [phone] Fax [phone] Main/Paging  CONTACT INFORMATION: Weekday (9AM-5PM) concerns: Call [facility] main office at [phone] Weeknight (5PM-9AM) or Weekend/Holiday concerns: Check [email or web address] for General Surgery [facility] coverage (Please, do not use SecureChat as it is not reliable communication to operating surgeons for immediate patient care)

## 2020-05-15 ENCOUNTER — Ambulatory Visit (INDEPENDENT_AMBULATORY_CARE_PROVIDER_SITE_OTHER): Payer: BC Managed Care – PPO | Admitting: Dermatology

## 2020-05-15 ENCOUNTER — Other Ambulatory Visit: Payer: Self-pay

## 2020-05-15 DIAGNOSIS — L814 Other melanin hyperpigmentation: Secondary | ICD-10-CM

## 2020-05-15 DIAGNOSIS — D485 Neoplasm of uncertain behavior of skin: Secondary | ICD-10-CM

## 2020-05-15 DIAGNOSIS — D225 Melanocytic nevi of trunk: Secondary | ICD-10-CM | POA: Diagnosis not present

## 2020-05-15 DIAGNOSIS — L918 Other hypertrophic disorders of the skin: Secondary | ICD-10-CM

## 2020-05-15 DIAGNOSIS — D239 Other benign neoplasm of skin, unspecified: Secondary | ICD-10-CM

## 2020-05-15 DIAGNOSIS — Z1283 Encounter for screening for malignant neoplasm of skin: Secondary | ICD-10-CM | POA: Diagnosis not present

## 2020-05-15 DIAGNOSIS — D229 Melanocytic nevi, unspecified: Secondary | ICD-10-CM | POA: Diagnosis not present

## 2020-05-15 DIAGNOSIS — D1801 Hemangioma of skin and subcutaneous tissue: Secondary | ICD-10-CM

## 2020-05-15 DIAGNOSIS — L820 Inflamed seborrheic keratosis: Secondary | ICD-10-CM | POA: Diagnosis not present

## 2020-05-15 DIAGNOSIS — L578 Other skin changes due to chronic exposure to nonionizing radiation: Secondary | ICD-10-CM

## 2020-05-15 DIAGNOSIS — L821 Other seborrheic keratosis: Secondary | ICD-10-CM

## 2020-05-15 DIAGNOSIS — D224 Melanocytic nevi of scalp and neck: Secondary | ICD-10-CM | POA: Diagnosis not present

## 2020-05-15 HISTORY — DX: Other benign neoplasm of skin, unspecified: D23.9

## 2020-05-15 NOTE — Patient Instructions (Signed)

## 2020-05-15 NOTE — Progress Notes (Signed)
New Patient Visit  Subjective  Jordan Buchanan is a 49 y.o. male who presents for the following: Annual Exam (patient has noticed irritating skin lesions on the upper eyelids). The patient presents for Total-Body Skin Exam (TBSE) for skin cancer screening and mole check.  The following portions of the chart were reviewed this encounter and updated as appropriate:  Tobacco  Allergies  Meds  Problems  Med Hx  Surg Hx  Fam Hx     Review of Systems:  No other skin or systemic complaints except as noted in HPI or Assessment and Plan.  Objective  Well appearing patient in no apparent distress; mood and affect are within normal limits.  A full examination was performed including scalp, head, eyes, ears, nose, lips, neck, chest, axillae, abdomen, back, buttocks, bilateral upper extremities, bilateral lower extremities, hands, feet, fingers, toes, fingernails, and toenails. All findings within normal limits unless otherwise noted below.  Objective  L upper eyelid x 5, R axilla, shoulders x 2 (8): Erythematous keratotic or waxy stuck-on papule or plaque.   Objective  L lat base of neck: 0.5 cm irregular brown macule  Objective  L lat pectoral: Light brown macule  Objective  upper eyelids: Fleshy, skin-colored pedunculated papules.     Assessment & Plan  Inflamed seborrheic keratosis (8) L upper eyelid x 5, R axilla, shoulders x 2  Destruction of lesion - L upper eyelid x 5, R axilla, shoulders x 2 Complexity: simple   Destruction method: cryotherapy   Informed consent: discussed and consent obtained   Timeout:  patient name, date of birth, surgical site, and procedure verified Lesion destroyed using liquid nitrogen: Yes   Region frozen until ice ball extended beyond lesion: Yes   Outcome: patient tolerated procedure well with no complications   Post-procedure details: wound care instructions given    Neoplasm of uncertain behavior of skin L lat base of neck  Epidermal  / dermal shaving  Lesion length (cm):  0.5 Lesion width (cm):  0.5 Margin per side (cm):  0.2 Total excision diameter (cm):  0.9 Informed consent: discussed and consent obtained   Timeout: patient name, date of birth, surgical site, and procedure verified   Procedure prep:  Patient was prepped and draped in usual sterile fashion Prep type:  Isopropyl alcohol Anesthesia: the lesion was anesthetized in a standard fashion   Anesthetic:  1% lidocaine w/ epinephrine 1-100,000 buffered w/ 8.4% NaHCO3 Instrument used: flexible razor blade   Hemostasis achieved with: pressure, aluminum chloride and electrodesiccation   Outcome: patient tolerated procedure well   Post-procedure details: sterile dressing applied and wound care instructions given   Dressing type: bandage and petrolatum    Specimen 1 - Surgical pathology Differential Diagnosis: D48.5 r/o dysplastic nevus Check Margins: No 0.5 cm irregular brown macule  Nevus L lat pectoral  Congenital nevus - Benign, observe.    Skin cancer screening  Skin tag upper eyelids  Benign, observe.     Lentigines - Scattered tan macules - Discussed due to sun exposure - Benign, observe - Call for any changes  Seborrheic Keratoses - Stuck-on, waxy, tan-brown papules and plaques  - Discussed benign etiology and prognosis. - Observe - Call for any changes  Melanocytic Nevi - Tan-brown and/or pink-flesh-colored symmetric macules and papules - Benign appearing on exam today - Observation - Call clinic for new or changing moles - Recommend daily use of broad spectrum spf 30+ sunscreen to sun-exposed areas.   Hemangiomas - Red papules - Discussed  benign nature - Observe - Call for any changes  Actinic Damage - diffuse scaly erythematous macules with underlying dyspigmentation - Recommend daily broad spectrum sunscreen SPF 30+ to sun-exposed areas, reapply every 2 hours as needed.  - Call for new or changing lesions.  Skin  cancer screening performed today.  Return in about 3 months (around 08/15/2020).  Luther Redo, CMA, am acting as scribe for Sarina Ser, MD .  Documentation: I have reviewed the above documentation for accuracy and completeness, and I agree with the above.  Sarina Ser, MD

## 2020-05-24 ENCOUNTER — Encounter: Payer: Self-pay | Admitting: Dermatology

## 2020-05-26 ENCOUNTER — Telehealth: Payer: Self-pay

## 2020-05-26 NOTE — Telephone Encounter (Signed)
Patient informed of pathology results 

## 2020-05-26 NOTE — Telephone Encounter (Signed)
-----   Message from Ralene Bathe, MD sent at 05/23/2020  2:48 PM EDT ----- [place],  Dysplastic Moderate Recheck next visit in 3 mos

## 2020-06-03 NOTE — Patient Instructions (Addendum)
DUE TO COVID-19 ONLY ONE VISITOR IS ALLOWED TO COME WITH YOU AND STAY IN THE WAITING ROOM ONLY DURING PRE OP AND PROCEDURE DAY OF SURGERY. THE 1 VISITOR MAY VISIT WITH YOU AFTER SURGERY IN YOUR PRIVATE ROOM DURING VISITING HOURS ONLY!  YOU NEED TO HAVE A COVID 19 TEST ON: 06/13/20 @ 2:00 pm, THIS TEST MUST BE DONE BEFORE SURGERY, COME  [place], [place] , [address].  ([place]) ONCE YOUR COVID TEST IS COMPLETED, PLEASE BEGIN THE QUARANTINE INSTRUCTIONS AS OUTLINED IN YOUR HANDOUT.                Nazar Kuan    Your procedure is scheduled on: 06/17/20   Report to [facility] Main  Entrance   Report to admitting at: 11:00 AM     Call this number if you have problems the morning of surgery [phone]    Remember:   DRINK 2 PRESURGERY ENSURE DRINKS THE NIGHT BEFORE SURGERY AT  1000 PM AND 1 PRESURGERY DRINK THE DAY OF THE PROCEDURE 3 HOURS PRIOR TO SCHEDULED SURGERY. NO SOLIDS AFTER MIDNIGHT THE DAY PRIOR TO THE SURGERY. NOTHING BY MOUTH EXCEPT CLEAR LIQUIDS UNTIL THREE HOURS PRIOR TO SCHEDULED SURGERY. PLEASE FINISH PRESURGERY ENSURE DRINK PER SURGEON ORDER 3 HOURS PRIOR TO SCHEDULED SURGERY TIME WHICH NEEDS TO BE COMPLETED AT: 10:00 am. PLEASE DRINK PLENTY CLEAR LIQUIDS THE DAY OF THE PREP.   CLEAR LIQUID DIET   Foods Allowed                                                                     Foods Excluded  Coffee and tea, regular and decaf                             liquids that you cannot  Plain Jell-O any favor except red or purple                                           see through such as: Fruit ices (not with fruit pulp)                                     milk, soups, orange juice  Iced Popsicles                                    All solid food Carbonated beverages, regular and diet                                    Cranberry, grape and apple juices Sports drinks like Gatorade Lightly seasoned clear broth or consume(fat  free) Sugar, honey syrup  Sample Menu Breakfast  Lunch                                     Supper Cranberry juice                    Beef broth                            Chicken broth Jell-O                                     Grape juice                           Apple juice Coffee or tea                        Jell-O                                      Popsicle                                                Coffee or tea                        Coffee or tea  _____________________________________________________________________  BRUSH YOUR TEETH MORNING OF SURGERY AND RINSE YOUR MOUTH OUT, NO CHEWING GUM CANDY OR MINTS.     Take these medicines the morning of surgery with A SIP OF WATER: AMLODIPINE,CETIRIZINE,METOPROLOL,PANTOPRAZOLE.                                 You may not have any metal on your body including hair pins and              piercings  Do not wear jewelry, lotions, powders or perfumes, deodorant              Men may shave face and neck.   Do not bring valuables to the hospital. [place].  Contacts, dentures or bridgework may not be worn into surgery.  Leave suitcase in the car. After surgery it may be brought to your room.     Patients discharged the day of surgery will not be allowed to drive home. IF YOU ARE HAVING SURGERY AND GOING HOME THE SAME DAY, YOU MUST HAVE AN ADULT TO DRIVE YOU HOME AND BE WITH YOU FOR 24 HOURS. YOU MAY GO HOME BY TAXI OR UBER OR ORTHERWISE, BUT AN ADULT MUST ACCOMPANY YOU HOME AND STAY WITH YOU FOR 24 HOURS.  Name and phone number of your driver:  Special Instructions: N/A              Please read over the following fact sheets you were given: _____________________________________________________________________  [facility] - Preparing for Surgery Before surgery, you can play an important role.  Because  skin is not sterile, your skin needs to be as free  of germs as possible.  You can reduce the number of germs on your skin by washing with CHG (chlorahexidine gluconate) soap before surgery.  CHG is an antiseptic cleaner which kills germs and bonds with the skin to continue killing germs even after washing. Please DO NOT use if you have an allergy to CHG or antibacterial soaps.  If your skin becomes reddened/irritated stop using the CHG and inform your nurse when you arrive at Short Stay. Do not shave (including legs and underarms) for at least 48 hours prior to the first CHG shower.  You may shave your face/neck. Please follow these instructions carefully:  1.  Shower with CHG Soap the night before surgery and the  morning of Surgery.  2.  If you choose to wash your hair, wash your hair first as usual with your  normal  shampoo.  3.  After you shampoo, rinse your hair and body thoroughly to remove the  shampoo.                           4.  Use CHG as you would any other liquid soap.  You can apply chg directly  to the skin and wash                       Gently with a scrungie or clean washcloth.  5.  Apply the CHG Soap to your body ONLY FROM THE NECK DOWN.   Do not use on face/ open                           Wound or open sores. Avoid contact with eyes, ears mouth and genitals (private parts).                       Wash face,  Genitals (private parts) with your normal soap.             6.  Wash thoroughly, paying special attention to the area where your surgery  will be performed.  7.  Thoroughly rinse your body with warm water from the neck down.  8.  DO NOT shower/wash with your normal soap after using and rinsing off  the CHG Soap.                9.  Pat yourself dry with a clean towel.            10.  Wear clean pajamas.            11.  Place clean sheets on your bed the night of your first shower and do not  sleep with pets. Day of Surgery : Do not apply any lotions/deodorants the morning of surgery.  Please wear clean clothes to the  hospital/surgery center.  FAILURE TO FOLLOW THESE INSTRUCTIONS MAY RESULT IN THE CANCELLATION OF YOUR SURGERY PATIENT SIGNATURE_________________________________  NURSE SIGNATURE__________________________________  ________________________________________________________________________   Adam Phenix  An incentive spirometer is a tool that can help keep your lungs clear and active. This tool measures how well you are filling your lungs with each breath. Taking long deep breaths may help reverse or decrease the chance of developing breathing (pulmonary) problems (especially infection) following:  A long period of time when you are unable to move or be active. BEFORE THE PROCEDURE   If  the spirometer includes an indicator to show your best effort, your nurse or respiratory therapist will set it to a desired goal.  If possible, sit up straight or lean slightly forward. Try not to slouch.  Hold the incentive spirometer in an upright position. INSTRUCTIONS FOR USE  1. Sit on the edge of your bed if possible, or sit up as far as you can in bed or on a chair. 2. Hold the incentive spirometer in an upright position. 3. Breathe out normally. 4. Place the mouthpiece in your mouth and seal your lips tightly around it. 5. Breathe in slowly and as deeply as possible, raising the piston or the ball toward the top of the column. 6. Hold your breath for 3-5 seconds or for as long as possible. Allow the piston or ball to fall to the bottom of the column. 7. Remove the mouthpiece from your mouth and breathe out normally. 8. Rest for a few seconds and repeat Steps 1 through 7 at least 10 times every 1-2 hours when you are awake. Take your time and take a few normal breaths between deep breaths. 9. The spirometer may include an indicator to show your best effort. Use the indicator as a goal to work toward during each repetition. 10. After each set of 10 deep breaths, practice coughing to be sure  your lungs are clear. If you have an incision (the cut made at the time of surgery), support your incision when coughing by placing a pillow or rolled up towels firmly against it. Once you are able to get out of bed, walk around indoors and cough well. You may stop using the incentive spirometer when instructed by your caregiver.  RISKS AND COMPLICATIONS  Take your time so you do not get dizzy or light-headed.  If you are in pain, you may need to take or ask for pain medication before doing incentive spirometry. It is harder to take a deep breath if you are having pain. AFTER USE  Rest and breathe slowly and easily.  It can be helpful to keep track of a log of your progress. Your caregiver can provide you with a simple table to help with this. If you are using the spirometer at home, follow these instructions: [place] IF:   You are having difficultly using the spirometer.  You have trouble using the spirometer as often as instructed.  Your pain medication is not giving enough relief while using the spirometer.  You develop fever of 100.5 F (38.1 C) or higher. SEEK IMMEDIATE MEDICAL CARE IF:   You cough up bloody sputum that had not been present before.  You develop fever of 102 F (38.9 C) or greater.  You develop worsening pain at or near the incision site. MAKE SURE YOU:   Understand these instructions.  Will watch your condition.  Will get help right away if you are not doing well or get worse. Document Released: 04/18/2007 Document Revised: 02/28/2012 Document Reviewed: 06/19/2007 [facility] Patient Information 2014 [place], [place].   ________________________________________________________________________

## 2020-06-04 ENCOUNTER — Encounter (HOSPITAL_COMMUNITY)
Admission: RE | Admit: 2020-06-04 | Discharge: 2020-06-04 | Disposition: A | Discharge status: Home / Self Care | Payer: BC Managed Care – PPO | Source: Ambulatory Visit | Attending: Surgery | Admitting: Surgery

## 2020-06-04 ENCOUNTER — Other Ambulatory Visit (HOSPITAL_COMMUNITY): Payer: BC Managed Care – PPO

## 2020-06-04 ENCOUNTER — Other Ambulatory Visit: Payer: Self-pay

## 2020-06-04 ENCOUNTER — Encounter (HOSPITAL_COMMUNITY): Payer: Self-pay

## 2020-06-04 DIAGNOSIS — Z01812 Encounter for preprocedural laboratory examination: Secondary | ICD-10-CM | POA: Diagnosis not present

## 2020-06-04 HISTORY — DX: Pneumonia, unspecified organism: J18.9

## 2020-06-04 LAB — BASIC METABOLIC PANEL
Anion gap: 11 (ref 5–15)
BUN: 10 mg/dL (ref 6–20)
CO2: 23 mmol/L (ref 22–32)
Calcium: 8.9 mg/dL (ref 8.9–10.3)
Chloride: 102 mmol/L (ref 98–111)
Creatinine, Ser: 0.77 mg/dL (ref 0.61–1.24)
GFR calc Af Amer: >60 mL/min (ref >60)
GFR calc non Af Amer: >60 mL/min (ref >60)
Glucose, Bld: 105 mg/dL — ABNORMAL HIGH (ref 70–99)
Potassium: 4 mmol/L (ref 3.5–5.1)
Sodium: 136 mmol/L (ref 135–145)

## 2020-06-04 LAB — CBC
HCT: 45.7 % (ref 39.0–52.0)
Hemoglobin: 15.9 g/dL (ref 13.0–17.0)
MCH: 30.2 pg (ref 26.0–34.0)
MCHC: 34.8 g/dL (ref 30.0–36.0)
MCV: 86.7 fL (ref 80.0–100.0)
Platelets: 267 10*3/uL (ref 150–400)
RBC: 5.27 MIL/uL (ref 4.22–5.81)
RDW: 13 % (ref 11.5–15.5)
WBC: 8 10*3/uL (ref 4.0–10.5)
nRBC: 0 % (ref 0.0–0.2)

## 2020-06-04 NOTE — Progress Notes (Signed)
COVID Vaccine Completed:yes Date COVID Vaccine completed:04/01/20 COVID vaccine manufacturer: *[place]   PCP -  Dr. Ria Bush. LOV: 03/14/20 Cardiologist - Dr. Peter Martinique. LOV: 07/08/19  Chest x-ray - 03/22/19 EKG - 04/29/20. EPIC Stress Test -  ECHO -  Cardiac Cath -   Sleep Study -  CPAP -   Fasting Blood Sugar -  Checks Blood Sugar _____ times a day  Blood Thinner Instructions: Aspirin Instructions: Last Dose:  Anesthesia review: Hx: HTN,arrhytmias  Patient denies shortness of breath, fever, cough and chest pain at PAT appointment   Patient verbalized understanding of instructions that were given to them at the PAT appointment. Patient was also instructed that they will need to review over the PAT instructions again at home before surgery.

## 2020-06-05 LAB — HEMOGLOBIN A1C
Hgb A1c MFr Bld: 5.2 % (ref 4.8–5.6)
Mean Plasma Glucose: 103 mg/dL

## 2020-06-12 DIAGNOSIS — Z01812 Encounter for preprocedural laboratory examination: Secondary | ICD-10-CM | POA: Diagnosis not present

## 2020-06-13 ENCOUNTER — Other Ambulatory Visit (HOSPITAL_COMMUNITY)
Admission: RE | Admit: 2020-06-13 | Discharge: 2020-06-13 | Disposition: A | Discharge status: Home / Self Care | Payer: BC Managed Care – PPO | Source: Ambulatory Visit | Attending: Surgery | Admitting: Surgery

## 2020-06-13 DIAGNOSIS — Z20822 Contact with and (suspected) exposure to covid-19: Secondary | ICD-10-CM | POA: Diagnosis not present

## 2020-06-13 DIAGNOSIS — Z01812 Encounter for preprocedural laboratory examination: Secondary | ICD-10-CM | POA: Diagnosis not present

## 2020-06-13 LAB — SARS CORONAVIRUS 2 (TAT 6-24 HRS): SARS Coronavirus 2: NEGATIVE

## 2020-06-16 DIAGNOSIS — K5669 Other partial intestinal obstruction: Secondary | ICD-10-CM | POA: Diagnosis not present

## 2020-06-16 DIAGNOSIS — K56699 Other intestinal obstruction unspecified as to partial versus complete obstruction: Secondary | ICD-10-CM | POA: Diagnosis not present

## 2020-06-16 DIAGNOSIS — K5720 Diverticulitis of large intestine with perforation and abscess without bleeding: Secondary | ICD-10-CM | POA: Diagnosis not present

## 2020-06-16 DIAGNOSIS — K5732 Diverticulitis of large intestine without perforation or abscess without bleeding: Secondary | ICD-10-CM | POA: Diagnosis not present

## 2020-06-16 DIAGNOSIS — K640 First degree hemorrhoids: Secondary | ICD-10-CM | POA: Diagnosis not present

## 2020-06-16 MED ORDER — BUPIVACAINE LIPOSOME 1.3 % IJ SUSP
20.0000 mL | INTRAMUSCULAR | Status: DC
  Filled 2020-06-16: qty 20

## 2020-06-16 NOTE — Anesthesia Preprocedure Evaluation (Addendum)
Anesthesia Evaluation  Patient identified by MRN, date of birth, ID band Patient awake    Reviewed: Allergy & Precautions, NPO status , Patient's Chart, lab work & pertinent test results  Airway Mallampati: II  TM Distance: >3 FB Neck ROM: Full    Dental no notable dental hx. (+) Teeth Intact, Dental Advisory Given   Pulmonary asthma , sleep apnea , former smoker,    Pulmonary exam normal breath sounds clear to auscultation       Cardiovascular hypertension, Pt. on medications Normal cardiovascular exam Rhythm:Regular Rate:Normal     Neuro/Psych  Headaches, negative psych ROS   GI/Hepatic Neg liver ROS, GERD  Medicated,  Endo/Other  negative endocrine ROSLab Results      Component                Value               Date                      CREATININE               0.77                06/04/2020                BUN                      10                  06/04/2020                NA                       136                 06/04/2020                K                        4.0                 06/04/2020                CL                       102                 06/04/2020                CO2                      23                  06/04/2020             Renal/GU Lab Results      Component                Value               Date                      CREATININE               0.77                06/04/2020  BUN                      10                  06/04/2020                NA                       136                 06/04/2020                K                        4.0                 06/04/2020                CL                       102                 06/04/2020                CO2                      23                  06/04/2020                Musculoskeletal negative musculoskeletal ROS (+)   Abdominal (+) + obese,   Peds  Hematology Lab Results      Component                Value                Date                      WBC                      8.0                 06/04/2020                HGB                      15.9                06/04/2020                HCT                      45.7                06/04/2020                MCV                      86.7                06/04/2020                PLT                      267  06/04/2020              Anesthesia Other Findings   Reproductive/Obstetrics                            Anesthesia Physical Anesthesia Plan  ASA: II  Anesthesia Plan: General   Post-op Pain Management:    Induction: Intravenous  PONV Risk Score and Plan: 4 or greater and Treatment may vary due to age or medical condition, Ondansetron, Dexamethasone and Midazolam  Airway Management Planned: Oral ETT  Additional Equipment: None  Intra-op Plan:   Post-operative Plan: Extubation in OR  Informed Consent: I have reviewed the patients History and Physical, chart, labs and discussed the procedure including the risks, benefits and alternatives for the proposed anesthesia with the patient or authorized representative who has indicated his/her understanding and acceptance.     Dental advisory given  Plan Discussed with: CRNA  Anesthesia Plan Comments: (GA w lidocaine infusion)       Anesthesia Quick Evaluation

## 2020-06-17 ENCOUNTER — Inpatient Hospital Stay (HOSPITAL_COMMUNITY): Payer: BC Managed Care – PPO | Admitting: Anesthesiology

## 2020-06-17 ENCOUNTER — Inpatient Hospital Stay (HOSPITAL_COMMUNITY)
Admission: RE | Admit: 2020-06-17 | Discharge: 2020-06-20 | DRG: 331 | Disposition: A | Discharge status: Home / Self Care | Payer: BC Managed Care – PPO | Attending: Surgery | Admitting: Surgery

## 2020-06-17 ENCOUNTER — Encounter (HOSPITAL_COMMUNITY): Payer: Self-pay | Admitting: Surgery

## 2020-06-17 ENCOUNTER — Inpatient Hospital Stay (HOSPITAL_COMMUNITY): Payer: BC Managed Care – PPO | Admitting: Physician Assistant

## 2020-06-17 ENCOUNTER — Encounter (HOSPITAL_COMMUNITY)
Admission: RE | Disposition: A | Discharge status: Home / Self Care | Payer: Self-pay | Source: Home / Self Care | Attending: Surgery

## 2020-06-17 ENCOUNTER — Other Ambulatory Visit: Payer: Self-pay

## 2020-06-17 DIAGNOSIS — Z8261 Family history of arthritis: Secondary | ICD-10-CM | POA: Diagnosis not present

## 2020-06-17 DIAGNOSIS — K219 Gastro-esophageal reflux disease without esophagitis: Secondary | ICD-10-CM | POA: Diagnosis not present

## 2020-06-17 DIAGNOSIS — Z820 Family history of epilepsy and other diseases of the nervous system: Secondary | ICD-10-CM

## 2020-06-17 DIAGNOSIS — E669 Obesity, unspecified: Secondary | ICD-10-CM | POA: Diagnosis not present

## 2020-06-17 DIAGNOSIS — K5732 Diverticulitis of large intestine without perforation or abscess without bleeding: Secondary | ICD-10-CM | POA: Diagnosis present

## 2020-06-17 DIAGNOSIS — G4730 Sleep apnea, unspecified: Secondary | ICD-10-CM | POA: Diagnosis present

## 2020-06-17 DIAGNOSIS — Z825 Family history of asthma and other chronic lower respiratory diseases: Secondary | ICD-10-CM

## 2020-06-17 DIAGNOSIS — Z8249 Family history of ischemic heart disease and other diseases of the circulatory system: Secondary | ICD-10-CM

## 2020-06-17 DIAGNOSIS — Z6833 Body mass index (BMI) 33.0-33.9, adult: Secondary | ICD-10-CM

## 2020-06-17 DIAGNOSIS — G4733 Obstructive sleep apnea (adult) (pediatric): Secondary | ICD-10-CM | POA: Diagnosis not present

## 2020-06-17 DIAGNOSIS — K5720 Diverticulitis of large intestine with perforation and abscess without bleeding: Principal | ICD-10-CM | POA: Diagnosis present

## 2020-06-17 DIAGNOSIS — I10 Essential (primary) hypertension: Secondary | ICD-10-CM | POA: Diagnosis present

## 2020-06-17 DIAGNOSIS — K5792 Diverticulitis of intestine, part unspecified, without perforation or abscess without bleeding: Secondary | ICD-10-CM | POA: Diagnosis not present

## 2020-06-17 DIAGNOSIS — E66811 Obesity, class 1: Secondary | ICD-10-CM | POA: Diagnosis present

## 2020-06-17 DIAGNOSIS — Z87891 Personal history of nicotine dependence: Secondary | ICD-10-CM

## 2020-06-17 HISTORY — PX: PROCTOSCOPY: SHX2266

## 2020-06-17 HISTORY — PX: OTHER SURGICAL HISTORY: SHX169

## 2020-06-17 LAB — TYPE AND SCREEN
ABO/RH(D): O POS
Antibody Screen: NEGATIVE

## 2020-06-17 LAB — ABO/RH: ABO/RH(D): O POS

## 2020-06-17 SURGERY — COLECTOMY, SIGMOID, ROBOT-ASSISTED
Anesthesia: General | Site: Abdomen

## 2020-06-17 MED ORDER — BUPIVACAINE-EPINEPHRINE 0.25% -1:200000 IJ SOLN
INTRAMUSCULAR | Status: AC
  Filled 2020-06-17: qty 2

## 2020-06-17 MED ORDER — EPHEDRINE 5 MG/ML INJ
INTRAVENOUS | Status: AC
  Filled 2020-06-17: qty 10

## 2020-06-17 MED ORDER — MIDAZOLAM HCL 2 MG/2ML IJ SOLN
INTRAMUSCULAR | Status: AC
  Filled 2020-06-17: qty 2

## 2020-06-17 MED ORDER — SODIUM CHLORIDE 0.9 % IV SOLN: Freq: Three times a day (TID) | INTRAVENOUS | Status: AC | PRN

## 2020-06-17 MED ORDER — BUPIVACAINE-EPINEPHRINE (PF) 0.25% -1:200000 IJ SOLN
INTRAMUSCULAR | Status: DC | PRN
  Administered 2020-06-17: 50 mL

## 2020-06-17 MED ORDER — SODIUM CHLORIDE 0.9 % IV SOLN
2.0000 g | Freq: Two times a day (BID) | INTRAVENOUS | Status: AC
  Administered 2020-06-18: 2 g via INTRAVENOUS
  Filled 2020-06-17: qty 2

## 2020-06-17 MED ORDER — ALBUTEROL SULFATE (2.5 MG/3ML) 0.083% IN NEBU: 2.5000 mg | INHALATION_SOLUTION | RESPIRATORY_TRACT | Status: DC | PRN

## 2020-06-17 MED ORDER — SODIUM CHLORIDE 0.9 % IV SOLN
2.0000 g | INTRAVENOUS | Status: AC
  Administered 2020-06-17: 2 g via INTRAVENOUS
  Filled 2020-06-17: qty 2

## 2020-06-17 MED ORDER — SUGAMMADEX SODIUM 500 MG/5ML IV SOLN
INTRAVENOUS | Status: DC | PRN
  Administered 2020-06-17: 250 mg via INTRAVENOUS

## 2020-06-17 MED ORDER — PROPOFOL 10 MG/ML IV BOLUS
INTRAVENOUS | Status: AC
  Filled 2020-06-17: qty 40

## 2020-06-17 MED ORDER — MIDAZOLAM HCL 5 MG/5ML IJ SOLN
INTRAMUSCULAR | Status: DC | PRN
  Administered 2020-06-17: 2 mg via INTRAVENOUS

## 2020-06-17 MED ORDER — OMEGA-3-ACID ETHYL ESTERS 1 G PO CAPS
1.0000 g | ORAL_CAPSULE | Freq: Every day | ORAL | Status: DC
  Administered 2020-06-18 – 2020-06-20 (×3): 1 g via ORAL
  Filled 2020-06-17 (×3): qty 1

## 2020-06-17 MED ORDER — ALVIMOPAN 12 MG PO CAPS
12.0000 mg | ORAL_CAPSULE | ORAL | Status: AC
  Administered 2020-06-17: 12 mg via ORAL
  Filled 2020-06-17: qty 1

## 2020-06-17 MED ORDER — BUPIVACAINE LIPOSOME 1.3 % IJ SUSP
INTRAMUSCULAR | Status: DC | PRN
  Administered 2020-06-17: 20 mL

## 2020-06-17 MED ORDER — POLYETHYLENE GLYCOL 3350 17 GM/SCOOP PO POWD: 1.0000 | Freq: Once | ORAL | Status: DC

## 2020-06-17 MED ORDER — LACTATED RINGERS IV SOLN: INTRAVENOUS | Status: AC

## 2020-06-17 MED ORDER — DEXAMETHASONE SODIUM PHOSPHATE 10 MG/ML IJ SOLN
INTRAMUSCULAR | Status: AC
  Filled 2020-06-17: qty 1

## 2020-06-17 MED ORDER — METOPROLOL TARTRATE 12.5 MG HALF TABLET
12.5000 mg | ORAL_TABLET | Freq: Two times a day (BID) | ORAL | Status: DC
  Administered 2020-06-18 – 2020-06-20 (×5): 12.5 mg via ORAL
  Filled 2020-06-17 (×6): qty 1

## 2020-06-17 MED ORDER — PROCHLORPERAZINE MALEATE 10 MG PO TABS
10.0000 mg | ORAL_TABLET | Freq: Four times a day (QID) | ORAL | Status: DC | PRN
  Filled 2020-06-17: qty 1

## 2020-06-17 MED ORDER — CELECOXIB 200 MG PO CAPS
200.0000 mg | ORAL_CAPSULE | ORAL | Status: AC
  Administered 2020-06-17: 200 mg via ORAL
  Filled 2020-06-17: qty 1

## 2020-06-17 MED ORDER — BUPIVACAINE-EPINEPHRINE 0.25% -1:200000 IJ SOLN
INTRAMUSCULAR | Status: AC
  Filled 2020-06-17: qty 1

## 2020-06-17 MED ORDER — ONDANSETRON HCL 4 MG PO TABS
4.0000 mg | ORAL_TABLET | Freq: Four times a day (QID) | ORAL | Status: DC | PRN
  Administered 2020-06-17: 4 mg via ORAL
  Filled 2020-06-17: qty 1

## 2020-06-17 MED ORDER — ROCURONIUM BROMIDE 10 MG/ML (PF) SYRINGE
PREFILLED_SYRINGE | INTRAVENOUS | Status: AC
  Filled 2020-06-17: qty 20

## 2020-06-17 MED ORDER — LACTATED RINGERS IR SOLN
Status: DC | PRN
  Administered 2020-06-17 (×2): 1000 mL

## 2020-06-17 MED ORDER — MAGIC MOUTHWASH
15.0000 mL | Freq: Four times a day (QID) | ORAL | Status: DC | PRN
  Filled 2020-06-17: qty 15

## 2020-06-17 MED ORDER — ACETAMINOPHEN 500 MG PO TABS
1000.0000 mg | ORAL_TABLET | ORAL | Status: AC
  Administered 2020-06-17: 1000 mg via ORAL
  Filled 2020-06-17: qty 2

## 2020-06-17 MED ORDER — ALUM & MAG HYDROXIDE-SIMETH 200-200-20 MG/5ML PO SUSP: 30.0000 mL | Freq: Four times a day (QID) | ORAL | Status: DC | PRN

## 2020-06-17 MED ORDER — HYDROMORPHONE HCL 1 MG/ML IJ SOLN
INTRAMUSCULAR | Status: AC
  Filled 2020-06-17: qty 1

## 2020-06-17 MED ORDER — ONDANSETRON HCL 4 MG/2ML IJ SOLN: 4.0000 mg | Freq: Four times a day (QID) | INTRAMUSCULAR | Status: DC | PRN

## 2020-06-17 MED ORDER — LACTATED RINGERS IV SOLN: INTRAVENOUS | Status: DC

## 2020-06-17 MED ORDER — ENOXAPARIN SODIUM 40 MG/0.4ML ~~LOC~~ SOLN
40.0000 mg | SUBCUTANEOUS | Status: DC
  Administered 2020-06-18 – 2020-06-20 (×3): 40 mg via SUBCUTANEOUS
  Filled 2020-06-17 (×3): qty 0.4

## 2020-06-17 MED ORDER — ENOXAPARIN SODIUM 40 MG/0.4ML ~~LOC~~ SOLN
40.0000 mg | Freq: Once | SUBCUTANEOUS | Status: AC
  Administered 2020-06-17: 40 mg via SUBCUTANEOUS
  Filled 2020-06-17: qty 0.4

## 2020-06-17 MED ORDER — METRONIDAZOLE 500 MG PO TABS: 1000.0000 mg | ORAL_TABLET | ORAL | Status: DC

## 2020-06-17 MED ORDER — FENTANYL CITRATE (PF) 100 MCG/2ML IJ SOLN
INTRAMUSCULAR | Status: DC | PRN
  Administered 2020-06-17 (×4): 50 ug via INTRAVENOUS

## 2020-06-17 MED ORDER — KETAMINE HCL 10 MG/ML IJ SOLN
INTRAMUSCULAR | Status: AC
  Filled 2020-06-17: qty 1

## 2020-06-17 MED ORDER — ONDANSETRON HCL 4 MG/2ML IJ SOLN
INTRAMUSCULAR | Status: DC | PRN
  Administered 2020-06-17: 4 mg via INTRAVENOUS

## 2020-06-17 MED ORDER — KETOROLAC TROMETHAMINE 30 MG/ML IJ SOLN: 30.0000 mg | Freq: Once | INTRAMUSCULAR | Status: DC | PRN

## 2020-06-17 MED ORDER — MEPERIDINE HCL 50 MG/ML IJ SOLN
INTRAMUSCULAR | Status: AC
  Filled 2020-06-17: qty 1

## 2020-06-17 MED ORDER — PHENYLEPHRINE HCL-NACL 10-0.9 MG/250ML-% IV SOLN
INTRAVENOUS | Status: DC | PRN
  Administered 2020-06-17: 15 ug/min via INTRAVENOUS

## 2020-06-17 MED ORDER — DIPHENHYDRAMINE HCL 12.5 MG/5ML PO ELIX
12.5000 mg | ORAL_SOLUTION | Freq: Four times a day (QID) | ORAL | Status: DC | PRN
  Administered 2020-06-19: 12.5 mg via ORAL
  Filled 2020-06-17: qty 5

## 2020-06-17 MED ORDER — SUCCINYLCHOLINE CHLORIDE 200 MG/10ML IV SOSY
PREFILLED_SYRINGE | INTRAVENOUS | Status: AC
  Filled 2020-06-17: qty 10

## 2020-06-17 MED ORDER — LORATADINE 10 MG PO TABS
10.0000 mg | ORAL_TABLET | Freq: Every day | ORAL | Status: DC
  Administered 2020-06-18 – 2020-06-20 (×3): 10 mg via ORAL
  Filled 2020-06-17 (×3): qty 1

## 2020-06-17 MED ORDER — PROMETHAZINE HCL 25 MG/ML IJ SOLN: 6.2500 mg | INTRAMUSCULAR | Status: DC | PRN

## 2020-06-17 MED ORDER — SUGAMMADEX SODIUM 500 MG/5ML IV SOLN
INTRAVENOUS | Status: AC
  Filled 2020-06-17: qty 5

## 2020-06-17 MED ORDER — ENSURE SURGERY PO LIQD
237.0000 mL | Freq: Two times a day (BID) | ORAL | Status: DC
  Administered 2020-06-18 – 2020-06-19 (×2): 237 mL via ORAL
  Filled 2020-06-17 (×6): qty 237

## 2020-06-17 MED ORDER — DIPHENHYDRAMINE HCL 50 MG/ML IJ SOLN: 12.5000 mg | Freq: Four times a day (QID) | INTRAMUSCULAR | Status: DC | PRN

## 2020-06-17 MED ORDER — TRAMADOL HCL 50 MG PO TABS
50.0000 mg | ORAL_TABLET | Freq: Four times a day (QID) | ORAL | Status: DC | PRN
  Administered 2020-06-17 – 2020-06-18 (×3): 100 mg via ORAL
  Administered 2020-06-19 (×2): 50 mg via ORAL
  Administered 2020-06-19 – 2020-06-20 (×3): 100 mg via ORAL
  Filled 2020-06-17 (×2): qty 1
  Filled 2020-06-17 (×6): qty 2

## 2020-06-17 MED ORDER — LIDOCAINE 2% (20 MG/ML) 5 ML SYRINGE
INTRAMUSCULAR | Status: DC | PRN
  Administered 2020-06-17: 1.5 mg/kg/h via INTRAVENOUS
  Administered 2020-06-17: 100 mg via INTRAVENOUS

## 2020-06-17 MED ORDER — NEOMYCIN SULFATE 500 MG PO TABS: 1000.0000 mg | ORAL_TABLET | ORAL | Status: DC

## 2020-06-17 MED ORDER — DEXAMETHASONE SODIUM PHOSPHATE 10 MG/ML IJ SOLN
INTRAMUSCULAR | Status: DC | PRN
  Administered 2020-06-17: 10 mg via INTRAVENOUS

## 2020-06-17 MED ORDER — GABAPENTIN 100 MG PO CAPS
200.0000 mg | ORAL_CAPSULE | Freq: Three times a day (TID) | ORAL | Status: DC
  Administered 2020-06-17 – 2020-06-18 (×4): 200 mg via ORAL
  Filled 2020-06-17 (×4): qty 2

## 2020-06-17 MED ORDER — ROCURONIUM BROMIDE 10 MG/ML (PF) SYRINGE
PREFILLED_SYRINGE | INTRAVENOUS | Status: DC | PRN
  Administered 2020-06-17: 20 mg via INTRAVENOUS
  Administered 2020-06-17: 80 mg via INTRAVENOUS
  Administered 2020-06-17: 20 mg via INTRAVENOUS

## 2020-06-17 MED ORDER — AMLODIPINE BESYLATE 10 MG PO TABS
10.0000 mg | ORAL_TABLET | Freq: Every day | ORAL | Status: DC
  Administered 2020-06-18 – 2020-06-20 (×3): 10 mg via ORAL
  Filled 2020-06-17 (×3): qty 1

## 2020-06-17 MED ORDER — MEPERIDINE HCL 50 MG/ML IJ SOLN
6.2500 mg | INTRAMUSCULAR | Status: DC | PRN
  Administered 2020-06-17: 12.5 mg via INTRAVENOUS

## 2020-06-17 MED ORDER — ALVIMOPAN 12 MG PO CAPS
12.0000 mg | ORAL_CAPSULE | Freq: Two times a day (BID) | ORAL | Status: DC
  Administered 2020-06-18: 12 mg via ORAL
  Filled 2020-06-17: qty 1

## 2020-06-17 MED ORDER — PROCHLORPERAZINE EDISYLATE 10 MG/2ML IJ SOLN: 5.0000 mg | Freq: Four times a day (QID) | INTRAMUSCULAR | Status: DC | PRN

## 2020-06-17 MED ORDER — ACETAMINOPHEN 500 MG PO TABS
1000.0000 mg | ORAL_TABLET | Freq: Four times a day (QID) | ORAL | Status: DC
  Administered 2020-06-17 – 2020-06-20 (×8): 1000 mg via ORAL
  Filled 2020-06-17 (×8): qty 2

## 2020-06-17 MED ORDER — PROPOFOL 10 MG/ML IV BOLUS
INTRAVENOUS | Status: DC | PRN
  Administered 2020-06-17: 20 mg via INTRAVENOUS
  Administered 2020-06-17: 200 mg via INTRAVENOUS
  Administered 2020-06-17: 30 mg via INTRAVENOUS

## 2020-06-17 MED ORDER — LIDOCAINE HCL 2 % IJ SOLN
INTRAMUSCULAR | Status: AC
  Filled 2020-06-17: qty 20

## 2020-06-17 MED ORDER — PHENYLEPHRINE 40 MCG/ML (10ML) SYRINGE FOR IV PUSH (FOR BLOOD PRESSURE SUPPORT)
PREFILLED_SYRINGE | INTRAVENOUS | Status: AC
  Filled 2020-06-17: qty 10

## 2020-06-17 MED ORDER — LIP MEDEX EX OINT
1.0000 "application " | TOPICAL_OINTMENT | Freq: Two times a day (BID) | CUTANEOUS | Status: DC
  Administered 2020-06-17 – 2020-06-20 (×6): 1 via TOPICAL
  Filled 2020-06-17 (×2): qty 7

## 2020-06-17 MED ORDER — ORAL CARE MOUTH RINSE: 15.0000 mL | Freq: Once | OROMUCOSAL | Status: DC

## 2020-06-17 MED ORDER — METOPROLOL TARTRATE 5 MG/5ML IV SOLN: 5.0000 mg | Freq: Four times a day (QID) | INTRAVENOUS | Status: DC | PRN

## 2020-06-17 MED ORDER — PANTOPRAZOLE SODIUM 40 MG PO TBEC
40.0000 mg | DELAYED_RELEASE_TABLET | Freq: Two times a day (BID) | ORAL | Status: DC
  Administered 2020-06-17 – 2020-06-20 (×6): 40 mg via ORAL
  Filled 2020-06-17 (×6): qty 1

## 2020-06-17 MED ORDER — KETAMINE HCL 10 MG/ML IJ SOLN
INTRAMUSCULAR | Status: DC | PRN
  Administered 2020-06-17: 35 mg via INTRAVENOUS

## 2020-06-17 MED ORDER — PHENYLEPHRINE 40 MCG/ML (10ML) SYRINGE FOR IV PUSH (FOR BLOOD PRESSURE SUPPORT)
PREFILLED_SYRINGE | INTRAVENOUS | Status: DC | PRN
  Administered 2020-06-17 (×3): 80 ug via INTRAVENOUS
  Administered 2020-06-17: 120 ug via INTRAVENOUS

## 2020-06-17 MED ORDER — HYDROMORPHONE HCL 1 MG/ML IJ SOLN
0.2500 mg | INTRAMUSCULAR | Status: DC | PRN
  Administered 2020-06-17 (×2): 0.5 mg via INTRAVENOUS

## 2020-06-17 MED ORDER — LIDOCAINE 2% (20 MG/ML) 5 ML SYRINGE
INTRAMUSCULAR | Status: AC
  Filled 2020-06-17: qty 5

## 2020-06-17 MED ORDER — GABAPENTIN 300 MG PO CAPS
300.0000 mg | ORAL_CAPSULE | ORAL | Status: AC
  Administered 2020-06-17: 300 mg via ORAL
  Filled 2020-06-17: qty 1

## 2020-06-17 MED ORDER — FENTANYL CITRATE (PF) 250 MCG/5ML IJ SOLN
INTRAMUSCULAR | Status: AC
  Filled 2020-06-17: qty 5

## 2020-06-17 MED ORDER — ALBUTEROL SULFATE HFA 108 (90 BASE) MCG/ACT IN AERS: 2.0000 | INHALATION_SPRAY | RESPIRATORY_TRACT | Status: DC | PRN

## 2020-06-17 MED ORDER — BISACODYL 5 MG PO TBEC: 20.0000 mg | DELAYED_RELEASE_TABLET | Freq: Once | ORAL | Status: DC

## 2020-06-17 MED ORDER — CHLORHEXIDINE GLUCONATE 0.12 % MT SOLN: 15.0000 mL | Freq: Once | OROMUCOSAL | Status: DC

## 2020-06-17 MED ORDER — HYDROMORPHONE HCL 1 MG/ML IJ SOLN
0.5000 mg | INTRAMUSCULAR | Status: DC | PRN
  Administered 2020-06-17: 1 mg via INTRAVENOUS
  Administered 2020-06-18 – 2020-06-19 (×6): 2 mg via INTRAVENOUS
  Administered 2020-06-19: 0.5 mg via INTRAVENOUS
  Administered 2020-06-20: 1 mg via INTRAVENOUS
  Filled 2020-06-17: qty 1
  Filled 2020-06-17: qty 2
  Filled 2020-06-17: qty 1
  Filled 2020-06-17: qty 2
  Filled 2020-06-17: qty 1
  Filled 2020-06-17 (×4): qty 2

## 2020-06-17 MED ORDER — INDOCYANINE GREEN 25 MG IV SOLR
INTRAVENOUS | Status: DC | PRN
  Administered 2020-06-17: 7.5 mg via INTRAVENOUS

## 2020-06-17 MED ORDER — 0.9 % SODIUM CHLORIDE (POUR BTL) OPTIME
TOPICAL | Status: DC | PRN
  Administered 2020-06-17: 2000 mL

## 2020-06-17 SURGICAL SUPPLY — 100 items
APPLIER CLIP 5 13 M/L LIGAMAX5 (MISCELLANEOUS)
APPLIER CLIP ROT 10 11.4 M/L (STAPLE)
BLADE EXTENDED COATED 6.5IN (ELECTRODE) IMPLANT
CANNULA REDUC XI 12-8 STAPL (CANNULA)
CANNULA REDUCER 12-8 DVNC XI (CANNULA) IMPLANT
CELLS DAT CNTRL 66122 CELL SVR (MISCELLANEOUS) IMPLANT
CHLORAPREP W/TINT 26 (MISCELLANEOUS) IMPLANT
CLIP APPLIE 5 13 M/L LIGAMAX5 (MISCELLANEOUS) IMPLANT
CLIP APPLIE ROT 10 11.4 M/L (STAPLE) IMPLANT
COVER SURGICAL LIGHT HANDLE (MISCELLANEOUS) ×6 IMPLANT
COVER TIP SHEARS 8 DVNC (MISCELLANEOUS) ×2 IMPLANT
COVER TIP SHEARS 8MM DA VINCI (MISCELLANEOUS) ×3
COVER WAND RF STERILE (DRAPES) ×3 IMPLANT
DECANTER SPIKE VIAL GLASS SM (MISCELLANEOUS) ×3 IMPLANT
DEVICE TROCAR PUNCTURE CLOSURE (ENDOMECHANICALS) IMPLANT
DRAIN CHANNEL 19F RND (DRAIN) IMPLANT
DRAPE ARM DVNC X/XI (DISPOSABLE) ×8 IMPLANT
DRAPE COLUMN DVNC XI (DISPOSABLE) ×2 IMPLANT
DRAPE DA VINCI XI ARM (DISPOSABLE) ×12
DRAPE DA VINCI XI COLUMN (DISPOSABLE) ×3
DRAPE SURG IRRIG POUCH 19X23 (DRAPES) ×3 IMPLANT
DRSG OPSITE POSTOP 4X10 (GAUZE/BANDAGES/DRESSINGS) IMPLANT
DRSG OPSITE POSTOP 4X6 (GAUZE/BANDAGES/DRESSINGS) ×3 IMPLANT
DRSG OPSITE POSTOP 4X8 (GAUZE/BANDAGES/DRESSINGS) IMPLANT
DRSG TEGADERM 2-3/8X2-3/4 SM (GAUZE/BANDAGES/DRESSINGS) ×15 IMPLANT
DRSG TEGADERM 4X4.75 (GAUZE/BANDAGES/DRESSINGS) IMPLANT
ELECT PENCIL ROCKER SW 15FT (MISCELLANEOUS) ×3 IMPLANT
ELECT REM PT RETURN 15FT ADLT (MISCELLANEOUS) ×3 IMPLANT
ENDOLOOP SUT PDS II  0 18 (SUTURE)
ENDOLOOP SUT PDS II 0 18 (SUTURE) IMPLANT
EVACUATOR SILICONE 100CC (DRAIN) IMPLANT
GAUZE SPONGE 2X2 8PLY STRL LF (GAUZE/BANDAGES/DRESSINGS) ×2 IMPLANT
GLOVE ECLIPSE 8.0 STRL XLNG CF (GLOVE) ×9 IMPLANT
GLOVE INDICATOR 8.0 STRL GRN (GLOVE) ×9 IMPLANT
GOWN STRL REUS W/TWL XL LVL3 (GOWN DISPOSABLE) ×9 IMPLANT
GRASPER SUT TROCAR 14GX15 (MISCELLANEOUS) IMPLANT
HANDLE SUCTION POOLE (INSTRUMENTS) ×2 IMPLANT
HOLDER FOLEY CATH W/STRAP (MISCELLANEOUS) ×3 IMPLANT
IRRIG SUCT STRYKERFLOW 2 WTIP (MISCELLANEOUS) ×3
IRRIGATION SUCT STRKRFLW 2 WTP (MISCELLANEOUS) ×2 IMPLANT
KIT PROCEDURE DA VINCI SI (MISCELLANEOUS) ×3
KIT PROCEDURE DVNC SI (MISCELLANEOUS) ×2 IMPLANT
KIT SIGMOIDOSCOPE (SET/KITS/TRAYS/PACK) ×3 IMPLANT
KIT TURNOVER KIT A (KITS) IMPLANT
NEEDLE INSUFFLATION 14GA 120MM (NEEDLE) ×3 IMPLANT
PACK CARDIOVASCULAR III (CUSTOM PROCEDURE TRAY) ×3 IMPLANT
PACK COLON (CUSTOM PROCEDURE TRAY) ×3 IMPLANT
PAD POSITIONING PINK XL (MISCELLANEOUS) ×3 IMPLANT
PORT LAP GEL ALEXIS MED 5-9CM (MISCELLANEOUS) ×3 IMPLANT
PROTECTOR NERVE ULNAR (MISCELLANEOUS) ×6 IMPLANT
RELOAD STAPLER 4.3X60 GRN DVNC (STAPLE) ×2 IMPLANT
RTRCTR WOUND ALEXIS 18CM MED (MISCELLANEOUS)
SCISSORS LAP 5X35 DISP (ENDOMECHANICALS) ×3 IMPLANT
SEAL CANN UNIV 5-8 DVNC XI (MISCELLANEOUS) ×8 IMPLANT
SEAL XI 5MM-8MM UNIVERSAL (MISCELLANEOUS) ×12
SEALER VESSEL DA VINCI XI (MISCELLANEOUS) ×3
SEALER VESSEL EXT DVNC XI (MISCELLANEOUS) ×2 IMPLANT
SOLUTION ELECTROLUBE (MISCELLANEOUS) ×3 IMPLANT
SPONGE GAUZE 2X2 STER 10/PKG (GAUZE/BANDAGES/DRESSINGS) ×1
STAPLER 45 BLU RELOAD XI (STAPLE) IMPLANT
STAPLER 45 BLUE RELOAD XI (STAPLE)
STAPLER 45 DA VINCI SURE FORM (STAPLE)
STAPLER 45 GREEN RELOAD XI (STAPLE)
STAPLER 45 GRN RELOAD XI (STAPLE) IMPLANT
STAPLER 45 SUREFORM DVNC (STAPLE) IMPLANT
STAPLER 60 DA VINCI SURE FORM (STAPLE) ×3
STAPLER 60 SUREFORM DVNC (STAPLE) ×2 IMPLANT
STAPLER CANNULA SEAL DVNC XI (STAPLE) ×2 IMPLANT
STAPLER CANNULA SEAL XI (STAPLE) ×3
STAPLER ECHELON POWER CIR 31 (STAPLE) ×3 IMPLANT
STAPLER RELOAD 4.3X60 GREEN (STAPLE) ×3
STAPLER RELOAD 4.3X60 GRN DVNC (STAPLE) ×2
STOPCOCK 4 WAY LG BORE MALE ST (IV SETS) ×6 IMPLANT
SUCTION POOLE HANDLE (INSTRUMENTS) ×3
SURGILUBE 2OZ TUBE FLIPTOP (MISCELLANEOUS) ×3 IMPLANT
SUT MNCRL AB 4-0 PS2 18 (SUTURE) ×3 IMPLANT
SUT PDS AB 1 CT1 27 (SUTURE) ×6 IMPLANT
SUT PROLENE 0 CT 2 (SUTURE) IMPLANT
SUT PROLENE 2 0 KS (SUTURE) ×3 IMPLANT
SUT PROLENE 2 0 SH DA (SUTURE) IMPLANT
SUT SILK 2 0 (SUTURE)
SUT SILK 2 0 SH CR/8 (SUTURE) IMPLANT
SUT SILK 2-0 18XBRD TIE 12 (SUTURE) IMPLANT
SUT SILK 3 0 (SUTURE)
SUT SILK 3 0 SH CR/8 (SUTURE) ×3 IMPLANT
SUT SILK 3-0 18XBRD TIE 12 (SUTURE) IMPLANT
SUT V-LOC BARB 180 2/0GR6 GS22 (SUTURE)
SUT VIC AB 3-0 SH 18 (SUTURE) IMPLANT
SUT VIC AB 3-0 SH 27 (SUTURE)
SUT VIC AB 3-0 SH 27XBRD (SUTURE) IMPLANT
SUT VICRYL 0 UR6 27IN ABS (SUTURE) ×3 IMPLANT
SUTURE V-LC BRB 180 2/0GR6GS22 (SUTURE) IMPLANT
SYR 10ML ECCENTRIC (SYRINGE) ×3 IMPLANT
SYS LAPSCP GELPORT 120MM (MISCELLANEOUS)
SYSTEM LAPSCP GELPORT 120MM (MISCELLANEOUS) IMPLANT
TOWEL OR NON WOVEN STRL DISP B (DISPOSABLE) ×3 IMPLANT
TRAY FOLEY MTR SLVR 16FR STAT (SET/KITS/TRAYS/PACK) ×3 IMPLANT
TROCAR ADV FIXATION 5X100MM (TROCAR) ×3 IMPLANT
TUBING CONNECTING 10 (TUBING) ×6 IMPLANT
TUBING INSUFFLATION 10FT LAP (TUBING) ×3 IMPLANT

## 2020-06-17 NOTE — Anesthesia Procedure Notes (Signed)
Procedure Name: Intubation Date/Time: 06/17/2020 2:33 PM Performed by: Silas [place], CRNA Pre-anesthesia Checklist: Patient identified, Emergency Drugs available, Suction available and Patient being monitored Patient Re-evaluated:Patient Re-evaluated prior to induction Oxygen Delivery Method: Circle system utilized Preoxygenation: Pre-oxygenation with 100% oxygen Induction Type: IV induction Ventilation: Mask ventilation without difficulty Laryngoscope Size: Mac and 4 Grade View: Grade II Tube type: Oral Tube size: 7.5 mm Number of attempts: 1 Airway Equipment and Method: Stylet and Oral airway Placement Confirmation: ETT inserted through vocal cords under direct vision,  positive ETCO2 and breath sounds checked- equal and bilateral Secured at: 23 cm Tube secured with: Tape Dental Injury: Teeth and Oropharynx as per pre-operative assessment

## 2020-06-17 NOTE — Op Note (Signed)
06/17/2020  6:12 PM  PATIENT:  Jordan Buchanan  49 y.o. male  [name], [name], [name], [name], [name], MD as Consulting Physician (General Surgery)  PRE-OPERATIVE DIAGNOSIS:  SIGMOID DIVERTICULITIS  POST-OPERATIVE DIAGNOSIS:  LEFT SIDED DIVERTICULITIS  PROCEDURE:   ROBOTIC LOW ANTERIOR RECTOSIGMOID RESECTION MOBILIZATION OF SPLENIC FLEXURE ASSESSMENT OF TISSUE PERFUSION USING FIREFLY BILATERAL TAP BLOCK RIGID PROCTOSCOPY  SURGEON:  Adin Hector, MD  ASSISTANT: Carlena Hurl, PA-C An experienced assistant was required given the standard of surgical care given the complexity of the case.  This assistant was needed for exposure, dissection, suctioning, retraction, instrument exchange, etc.  ANESTHESIA:     General  Nerve block provided with liposomal bupivacaine (Experel) mixed with 0.25% bupivacaine as a Bilateral TAP block x 53mL each side at the level of the transverse abdominis & preperitoneal spaces along the flank at the anterior axillary line, from subcostal ridge to iliac crest under laparoscopic guidance   Local field block at port sites & extraction wound  EBL:  Total I/O In: 2100 [I.V.:2000; IV Piggyback:100] Out: 130 [Urine:80; Blood:50]  Delay start of Pharmacological VTE agent (>24hrs) due to surgical blood loss or risk of bleeding:  no  DRAINS: No  SPECIMEN:   RECTOSIGMOID COLON (open end proximal) DISTAL ANASTOMOTIC RING (final distal margin)  DISPOSITION OF SPECIMEN:  PATHOLOGY  COUNTS:  YES  PLAN OF CARE: Admit to inpatient   PATIENT DISPOSITION:  PACU - hemodynamically stable.  INDICATION:    Patient with evidence of diverticulitis with perforation requiring washout and drainage.  Stabilized.  Colonoscopy showing no mass or tumor but sigmoid stricture.  I recommended segmental  resection:  The anatomy & physiology of the digestive tract was discussed.  The pathophysiology was discussed.  Natural history risks without surgery was discussed.   I worked to give an overview of the disease and the frequent need to have multispecialty involvement.  I feel the risks of no intervention will lead to serious problems that outweigh the operative risks; therefore, I recommended a partial colectomy to remove the pathology.  Laparoscopic & open techniques were discussed.   Risks such as bleeding, infection, abscess, leak, reoperation, possible ostomy, hernia, heart attack, death, and other risks were discussed.  I noted a good likelihood this will help address the problem.   Goals of post-operative recovery were discussed as well.  We will work to minimize complications.  Educational materials on the pathology had been given in the office.  Questions were answered.    The patient expressed understanding & wished to proceed with surgery.  OR FINDINGS:   Patient had inflamed left-sided colon from mid descending colon down to proximal rectum consistent with diverticulitis.  No obvious metastatic disease on visceral parietal peritoneum or liver.  The anastomosis rests 12 cm from the anal verge by rigid proctoscopy.  CASE DATA:  Type of patient?: Elective [facility] Private Case  Status of Case? Elective Scheduled  Infection Present At Time Of Surgery (PATOS)?  PHLEGMON  DESCRIPTION:   Informed consent was confirmed.  The patient underwent general anaesthesia without difficulty.  The patient was positioned appropriately.  VTE prevention in place.  The patient was clipped, prepped, & draped in a sterile fashion.  Surgical timeout confirmed our plan.  The patient was positioned in reverse Trendelenburg.  Abdominal entry was gained using Varess technique at the left  subcostal ridge on the anterior abdominal wall.  No elevated EtCO2 noted.  Port placed.  Camera inspection revealed no injury.   Extra ports were carefully placed under direct laparoscopic visualization.  I reflected the greater omentum and the upper abdomen the small bowel in the upper abdomen.  The patient was carefully positioned.  The [employer] daVinci robot was docked with camera & instruments carefully placed.  The patient had inflamed mid descending to sigmoid colon with some foreshortening & phlegmon.  There were moderate wispy adhesions in the pelvis.  Also some interloop adhesions of the ileum.  Patient had significant mesenteric fat and overall edema and swelling of the ileum.  However there is no active pus.  I scored the base of peritoneum of the medial side of the mesentery of the elevated left colon from the ligament of Treitz to the peritoneal reflection of the mid rectum.   I elevated the sigmoid mesentery and entered into the retro-mesenteric plane. We were able to identify the left ureter and gonadal vessels. We kept those posterior within the retroperitoneum and elevated the left colon mesentery off that. I did isolate the inferior mesenteric artery (IMA) pedicle but did not ligate it yet.  I continued distally and got into the avascular plane posterior to the mesorectum. This allowed me to help mobilize the rectum as well by freeing the mesorectum off the sacrum.  I mobilized the peritoneal coverings towards the peritoneal reflection on both the right and left sides of the rectum.  I stayed away from the right and left ureters.  I kept the lateral vascular pedicles to the rectum intact.  I skeletonized the lymph nodes off the inferior mesenteric artery pedicle.  I went down to its takeoff from the aorta.  I isolated the inferior mesenteric vein off of the ligament of Treitz just cephalad to that as well.  After confirming the left ureter was out of the way, I went ahead and ligated the inferior mesenteric artery pedicle just near its takeoff from the aorta.  I did ligate the inferior mesenteric vein in a similar  fashion.  We ensured hemostasis. I skeletonized the mesorectum at the junction at the proximal & mid rectum for the distal point of resection.  I had to go more distally since there was inflammation in the proximal rectum.   I mobilized the left colon in a lateral to medial fashion off the line of Toldt up towards the splenic flexure to ensure good mobilization of the remaining left colon to reach into the pelvis.  However I cannot get the proximal descending colon to reach.  Therefore did mobilization of the splenic flexure.  I transected the greater omentum off the mid transverse colon and followed more distally.  I was able to find the mesocolon attachments to the inferior pancreatic ridge.  I transected these off the retroperitoneum at the inferior pancreatic ridge and connected with my prior of left-sided retroperitoneal dissection.  Mobilized the splenic flexure of the colon.  That allowed more mobility.  There was shortened mesentery in the left side so I did have to transect the pedicle more proximally.  Transected back to the middle colic pedicle.  With that I could get mobilization of the entire left colon and get proximal descending colon to reach down that was noninflamed.  I transected the mesentery in a radial fashion and the preselected area around the mid descending colon that could reach without tension.  Retroperitoneum on the left side injured his fascia  was somewhat inflamed and oozy at first but after irrigation things cleared up well we ensured good hemostasis.  Again the left gonadal vein and ureter were in the retroperitoneal position and not injured.  We asked anesthesia to dilute the indocyanine green (ICG) to 10 mL and inject 3 mL intravenously with IV flush.  I switched to the NIR fluorescence (Firefly mode) imaging window on the daVinci platform.  I was able to see good light green visualization of blood vessels with good perfusion of tissues, confirming good tissue perfusion of both  ends planned for anastomosis.  I transected at the proximal/mid rectum junction using a robotic 60 mm stapler.    I created an extraction incision through a small Pfannenstiel incision in the suprapubic region where the robotic 85mm stapler port had been placed.  Placed a wound protector.  I was able to eviscerate the rectosigmoid and descending colon out the wound.   I clamped the colon proximal to this area using a reusable pursestringer device.  Passed a 2-0 Keith needle. I transected at the descending/sigmoid junction with a scalpel. I got healthy bleeding mucosa.  We sent the rectosigmoid colon specimen off to go to pathology.  We sized the colon orifice.  I chose a 31 EEA anvil stapler system.  I reinforced the prolene pursestring with interrupted silk suture.  I placed the anvil to the open end of the proximal remaining colon and closed around it using the pursestring.    We did copious irrigation with crystalloid solution.  Hemostasis was good.  The distal end of the remaining colon easily reached down to the rectal stump.  Ms Lemar Lofty scrubbed down and did gentle anal dilation and advanced the EEA stapler up the rectal stump. The spike was brought out at the provimal end of the rectal stump under direct visualization.  I attached the anvil of the proximal colon the spike of the stapler. Anvil was tightened down and held clamped for 60 seconds. The EEA stapler was fired and held clamped for 30 seconds. The stapler was released & removed. We noted 2 excellent anastomotic rings. Blue stitch is in the proximal ring.  We did rigid proctoscopy noted the anastomosis was at 12 cm from the anal verge consistent with the proximal rectum.  We irrigation held that for the pelvic air leak test.  The rectum was insufflated the rectum while clamping the colon proximal to that anastomosis.  There was a negative air leak test. There was no tension of mesentery or bowel at the anastomosis.   Tissues looked viable.  Ureters  & bowel uninjured.  The anastomosis looked healthy.  Endoluminal gas was evacuated.  Ports & wound protector removed.  We changed gloves & redraped the patient per colon SSI prevention protocol.  We aspirated the irrigation.  Hemostasis was good.  Sterile unused instruments were used from this point.  I closed the skin at the port sites using Monocryl stitch and sterile dressing.  I closed the extraction wound using a 0 Vicryl vertical peritoneal closure and a #1 PDS transverse anterior rectal fascial closure like a small Pfannenstiel closure. I closed the skin with some interrupted Monocryl stitches. I placed antibiotic-soaked wicks into the closure at the corners x2.  I placed sterile dressings.     Patient is being extubated go to recovery room. I had discussed postop care with the patient in detail the office & in the holding area. Instructions are written. I discussed operative findings, updated the patient's status,  discussed probable steps to recovery, and gave postoperative recommendations to the patient's spouse, Jordan Buchanan.  Recommendations were made.  Questions were answered.  She expressed understanding & appreciation.   Adin Hector, M.D., F.A.C.S. Gastrointestinal and Minimally Invasive Surgery [facility] [address] [place], [place] [address] [phone] Main / Paging

## 2020-06-17 NOTE — Anesthesia Postprocedure Evaluation (Signed)
Anesthesia Post Note  Patient: Jordan Buchanan  Procedure(s) Performed: ROBOTIC LOW ANTERIOR RESECTION, MOBILIZATION OF SPLENIC FLEXURE, ASSESSMENT OF TISSUE PERFUSION USING FIREFLY, BILATERAL TAP BLOCK (N/A Abdomen) RIGID PROCTOSCOPY (N/A )     Patient location during evaluation: PACU Anesthesia Type: General Level of consciousness: sedated Pain management: pain level controlled Vital Signs Assessment: post-procedure vital signs reviewed and stable Respiratory status: spontaneous breathing Cardiovascular status: stable Postop Assessment: no apparent nausea or vomiting Anesthetic complications: no   No complications documented.  Last Vitals:  Vitals:   06/17/20 1815 06/17/20 1830  BP: (!) 124/106 137/85  Pulse: 89 93  Resp: (!) 21 (!) 21  Temp:    SpO2: 96% 92%    Last Pain:  Vitals:   06/17/20 1830  TempSrc:   PainSc: [employer]

## 2020-06-17 NOTE — H&P (Signed)
Jordan Buchanan DOB: 1971/05/14 Married / Language: English / Race: White Male  [name], [name], [name], [name], [name], MD as Consulting Physician (General Surgery)    The patient returns s/p laparoscopic cholecystectomy for acute cholecystitis.  Drainage of sigmoid abscess with washout for worsening diverticulitis with perforation and abscess. 04/30/2020      The patient returns to clinic after surgery, gradually improving. Patient diagnosed with diverticulitis with microperforation. Admitted with antibiotics. Then returned with upper abdominal pain and found to have cholecystitis. Progressive gas formation suspicious for worsening abscess. Therefore today warranted cholecystectomy. Opened up and drained and washed out his abscess and left a surgical drain. Improved and went home postoperative day #3  Tolerating a soft diet. He did have some complaints of plaques and oral burning suspicious for thrush. Nystatin swish and swallow ordered. Sent home on oral Cipro and Flagyl since he had progression of his diverticulitis with abscess on Augmentin. Pain from the incisions is fading away. Drain is putting out 10-15 millimeters day. Some old blood but more thin. His bowels are normalizing. His abdominal pain is gone down. Appetite and energy level improving. He is wondering if he can get back to doing some work but is hesitant to do unrestricted activity. Denies nausea, constipation/diarrhea, worsening fatigue, high fevers, or other concerns. `  Pathology: SURGICAL PATHOLOGY CASE: [id number] PATIENT: Jordan Buchanan Surgical Pathology Report     Clinical History: Acute cholecystitis, acute sigmoid diverticulitis (crm)     FINAL MICROSCOPIC DIAGNOSIS:  A. GALLBLADDER,  CHOLECYSTECTOMY: - Acute and chronic cholecystitis. - Attached hepatic parenchyma. - There is no evidence of malignancy.   Annastyn Silvey DESCRIPTION:  Size/?Intact: 7.3 x 3.6 x 1.8 cm previously opened gallbladder, received fresh Serosal surface: Smooth and hyperemic Mucosa/Wall: The mucosa is glistening tan-red. The wall measures up to 0.6 cm in thickness. Contents: There are no contents. There are no calculi present in the gallbladder or in the specimen container. Cystic duct: Patent Block Summary: 1 block submitted (GRP 04/30/2020)    Final Diagnosis performed by Enid Cutter, MD. Electronically signed 05/01/2020 Technical component performed at [facility] [address], [place], [place] [address]. Professional component performed at [employer]. [facility], [place] [address] [place], [place] [address]. Immunohistochemistry Technical component (if applicable) was performed at [facility]. [address] [place], [place], [place] [address]. IMMUNOHISTOCHEMISTRY DISCLAIMER (if applicable): Some of these immunohistochemical stains may have been developed and the performance characteristics determine by [facility] Some may not have been cleared or approved by the U.S. Food and Drug Administration. The FDA has determined that such clearance or approval is not necessary. This test is used for clinical purposes. It should not be regarded as investigational or for research. This laboratory is certified under the [place] (CLIA-88) as qualified to perform high complexity clinical laboratory testing. The controls stained appropriately. ` ` `   04/30/2020  PATIENT: Jordan Buchanan 49 y.o. male  [name], [name], [name], [name], MD as Consulting Physician  (Gastroenterology) Michael Boston, MD as Consulting Physician (General Surgery)  PRE-OPERATIVE DIAGNOSIS:   Acute on Chronic Calculus Cholecystitis Sigmoid diverticulitis with abscess  POST-OPERATIVE DIAGNOSIS:  Acute on Chronic Calculus Cholecystitis Sigmoid diverticulitis with  abscess Liver: Fatty steatohepatitis  PROCEDURE: Laparoscopic cholecystectomy with intraoperative cholangiogram Sigmoid colon mobilization with drainage of abscess, washout, and drain placement  SURGEON: Adin Hector, MD, FACS.  ASSISTANT: Fran Lowes, PA-S, [employer]  ANESTHESIA:  General with endotracheal intubation Local anesthetic as a field block  Nerve block provided with liposomal bupivacaine (Experel) mixed with 0.25% bupivacaine as a Bilateral TAP block x 58mL each side at the level of the transverse abdominis & preperitoneal spaces along the flank at the anterior axillary line, from subcostal ridge to iliac crest under laparoscopic guidance   EBL: (See Anesthesia Intraoperative Record) Total I/O In: 20 [I.V.:20] Out: 150 [Urine:100; Blood:50]   Delay start of Pharmacological VTE agent (>24hrs) due to surgical blood loss or risk of bleeding: no  DRAINS: 19 Fr Blake closed bulb drain in the right paramedian port site goes along left paracolic gutter over abscess cavity and down in pelvis.  SPECIMEN: Gallbladder   DISPOSITION OF SPECIMEN: PATHOLOGY  COUNTS: YES  PLAN OF CARE: Admit to inpatient  PATIENT DISPOSITION: PACU - hemodynamically stable.  INDICATION: Pleasant patient with diverticulitis with focal perforation. Initially improved on antibiotics. Then had worsening abdominal pain. Seem more epigastric. Almost like biliary colic. Ultrasound suspicious for cholecystitis. CT scan notes larger gas pocket with some possible suspicious for worsening abscess. I recommended laparoscopic cholecystectomy. Because he is not in particular shock  or recommended laparoscopic drainage of abscess since no great window for percutaneous drainage. Washout with drain placement. Allow the diverticulitis to get under better control for probable 1 stage operation and minimize Hartman resection with end colostomy and rectal stump this admission.  The anatomy & physiology of hepatobiliary & pancreatic function was discussed. The pathophysiology of gallbladder dysfunction was discussed. Natural history risks without surgery was discussed. I feel the risks of no intervention will lead to serious problems that outweigh the operative risks; therefore, I recommended cholecystectomy to remove the pathology. I explained laparoscopic techniques with possible need for an open approach. Probable cholangiogram to evaluate the bilary tract was explained as well.   Pathophysiology of diverticulitis with abscess was discussed. Admission made for laparoscopic mobilization and aspiration of abscess with probable drain placement.  Risks such as bleeding, infection, abscess, leak, injury to other organs, need for further treatment, heart attack, death, and other risks were discussed. Risk of need for colectomy/colostomy noted as well. I noted a good likelihood this will help address the problem. Possibility that this will not correct all abdominal symptoms was explained. Goals of post-operative recovery were discussed as well. We will work to minimize complications. An educational handout further explaining the pathology and treatment options was given as well. Questions were answered. The patient expresses understanding & wishes to proceed with surgery.  OR FINDINGS: Edematous inflamed gallbladder with some ischemia suspicious with acute on chronic cholecystitis.  Cholangiogram showing classic biliary anatomy without any obstruction or choledocholithiasis. No leak. Fatty change in liver with probable steatohepatitis  Thickened sigmoid colon with  phlegmon & abscess in mesentery between retroperitoneum/psoas and posterior sigmoid mesentery. Mainly gas in abscess pocket. Some purulence. Able to aspirate and break in cavity up with washout. Drain placed as noted above. No evidence of any feculent peritonitis at this time.  CASE DATA:  Type of patient?: LDOW CASE (Surgical Hospitalist [facility] Inpatient)  Status of Case? URGENT Add On  Infection Present At Time Of Surgery (PATOS)? ABSCESS, PHLEGMON   DESCRIPTION:  Informed consent was confirmed. The patient underwent general anaesthesia without difficulty. The patient  was positioned appropriately. VTE prevention in place. The patient's abdomen was clipped, prepped, & draped in a sterile fashion. Surgical timeout confirmed our plan.  Peritoneal entry with a laparoscopic port was obtained using optical entry technique in the right upper abdomen as the patient was positioned in reverse Trendelenburg. Entry was clean. I induced carbon dioxide insufflation. Camera inspection revealed no injury. Extra ports were carefully placed under direct laparoscopic visualization.  I turned attention to the right upper quadrant. Gallbladder was edematous with inflammation and some ischemia. Consistent with acute on chronic cholecystitis. I aspirated the gallbladder with a needle on the laparoscopic suction. This help decompress the gallbladder. Still rather thickened with this the gallbladder fundus was elevated cephalad. I used hook cautery to free the peritoneal coverings between the gallbladder and the liver on the posteriolateral and anteriomedial walls. I used careful blunt and hook dissection to help get a good critical view of the cystic artery and cystic duct. I did further dissection to free 50%of the gallbladder off the liver bed to get a good critical view of the infundibulum and cystic duct. I dissected out the cystic artery; and, after getting a good 360 view, ligated the  anterior & posterior branches of the cystic artery close on the infundibulum using clips and hook cautery on the gallbladder side..  I skeletonized the cystic duct. I placed a clip on the infundibulum. I did a partial cystic duct-otomy and ensured patency. I placed a 5 [place] cholangiocatheter through a puncture site at the right subcostal ridge of the abdominal wall and directed it into the cystic duct. We ran a cholangiogram with dilute radio-opaque contrast and continuous fluoroscopy. Contrast flowed from a side branch consistent with cystic duct cannulization. Contrast flowed up the common hepatic duct into the right and left intrahepatic chains out to secondary radicals. Contrast flowed down the common bile duct easily across the normal ampulla into the duodenum. This was consistent with a normal cholangiogram. I removed the cholangiocatheter. I placed clips on the cystic duct x4. I completed cystic duct transection. I freed the gallbladder from its remaining attachments to the liver. I ensured hemostasis on the gallbladder fossa of the liver and elsewhere. I inspected the rest of the abdomen & detected no injury nor bleeding elsewhere. Placed the gallbladder inside and ecosac bag. I removed the gallbladder inside the sac out the epigastric port.  Next we turned attention to the sigmoid diverticulitis. Patient placed in Trendelenburg positioning. Small bowel and great omentum reflected to expose the sigmoid colon. Was able to mobilize the left colon and lateral medial fashion starting at the uninflamed descending colon and coming more distally. Found an inflamed plane between the rectosigmoid colon and the retroperitoneum and mobilized it over. Inflamed phlegmonous area was able to probe into the sigmoid mesentery and break up some inflammation and purulence consistent with a abscess cavity. Did irrigation over a liter to help wash the abscess cavity out. Aspirated. Hemostasis good.  Another irrigation of antibiotic solution (clindamycin/gentamicin). Placed a drain on the right upper quadrant to go running along the left paracolic gutter and retroperitoneum along over the psoas with the tip down the pelvis. Allowed the sigmoid colon to roll back over it.  I closed the subxiphoid fascia transversely using 0 Vicryl interrupted stitches under laparoscopic visualization. Brought the greater omentum down allowed it to come down the pelvis to cover over the sigmoid colon and drain as well. Evacuated carbon dioxide and remove the ports. I closed the skin using  4-0 monocryl stitch. Sterile dressings were applied. The patient was extubated & arrived in the PACU in stable condition..  I had discussed postoperative care with the patient in the holding area. I made an attempt to locate family to discuss patient's status and recommendations. No one is available at this time. I will try again later   Adin Hector, M.D., F.A.C.S. Gastrointestinal and Minimally Invasive Surgery [facility] [address], [place], [place] [address] [phone] Main / Paging  04/30/2020 12:03 PM   Past Surgical History (Chanel Teressa Senter, [place]; 05/13/2020 11:41 AM) Gallbladder Surgery - Laparoscopic Oral Surgery Shoulder Surgery Bilateral. Ventral / Umbilical Hernia Surgery Left.  Allergies (Chanel Teressa Senter, CMA; 05/13/2020 11:41 AM) No Known Drug Allergies [05/13/2020]: Allergies Reconciled  Medication History (Chanel Teressa Senter, CMA; 05/13/2020 11:42 AM) Metoprolol Succinate ER (50MG  Tablet ER 24HR, Oral) Active. amLODIPine Besylate (10MG  Tablet, Oral) Active. Medications Reconciled  Social History Antonietta Jewel, CMA; 05/13/2020 11:41 AM) Alcohol use Remotely quit alcohol use. Caffeine use Carbonated beverages, Coffee, Tea. Illicit drug use Remotely quit drug use. Tobacco use Former smoker.  Family History ([place], [place]; 05/13/2020  11:41 AM) Hypertension Father.  Other Problems (Chanel Teressa Senter, CMA; 05/13/2020 11:41 AM) Gastroesophageal Reflux Disease High blood pressure Sleep Apnea Umbilical Hernia Repair     Review of Systems (Chanel Nolan CMA; 05/13/2020 11:41 AM) General Not Present- Appetite Loss, Chills, Fatigue, Fever, Night Sweats, Weight Gain and Weight Loss. Skin Not Present- Change in Wart/Mole, Dryness, Hives, Jaundice, New Lesions, Non-Healing Wounds, Rash and Ulcer. HEENT Present- Seasonal Allergies and Wears glasses/contact lenses. Not Present- Earache, Hearing Loss, Hoarseness, Nose Bleed, Oral Ulcers, Ringing in the Ears, Sinus Pain, Sore Throat, Visual Disturbances and Yellow Eyes. Respiratory Not Present- Bloody sputum, Chronic Cough, Difficulty Breathing, Snoring and Wheezing. Breast Not Present- Breast Mass, Breast Pain, Nipple Discharge and Skin Changes. Cardiovascular Not Present- Chest Pain, Difficulty Breathing Lying Down, Leg Cramps, Palpitations, Rapid Heart Rate, Shortness of Breath and Swelling of Extremities. Gastrointestinal Present- Abdominal Pain and Change in Bowel Habits. Not Present- Bloating, Bloody Stool, Chronic diarrhea, Constipation, Difficulty Swallowing, Excessive gas, Gets full quickly at meals, Hemorrhoids, Indigestion, Nausea, Rectal Pain and Vomiting. Male Genitourinary Not Present- Blood in Urine, Change in Urinary Stream, Frequency, Impotence, Nocturia, Painful Urination, Urgency and Urine Leakage. Musculoskeletal Not Present- Back Pain, Joint Pain, Joint Stiffness, Muscle Pain, Muscle Weakness and Swelling of Extremities. Neurological Not Present- Decreased Memory, Fainting, Headaches, Numbness, Seizures, Tingling, Tremor, Trouble walking and Weakness. Psychiatric Not Present- Anxiety, Bipolar, Change in Sleep Pattern, Depression, Fearful and Frequent crying. Endocrine Not Present- Cold Intolerance, Excessive Hunger, Hair Changes, Heat Intolerance, Hot flashes and  New Diabetes. Hematology Not Present- Blood Thinners, Easy Bruising, Excessive bleeding, Gland problems, HIV and Persistent Infections.  Vitals (Chanel Nolan CMA; 05/13/2020 11:42 AM) 05/13/2020 11:42 AM Weight: 229.25 lb Height: 70in Body Surface Area: 2.21 m Body Mass Index: 32.89 kg/m  Temp.: 98.72F  Pulse: 89 (Regular)  BP: 132/82(Sitting, Left Arm, Standard)        Physical Exam [name]; 05/13/2020 12:30 PM)  General Mental Status-Alert. General Appearance-Not in acute distress, Not Sickly. Orientation-Oriented X3. Hydration-Well hydrated. Voice-Normal. Note: Calm. Moving normally. Not toxic. Not sickly. No guarding.  Integumentary Global Assessment Upon inspection and palpation of skin surfaces of the - Axillae: non-tender, no inflammation or ulceration, no drainage. and Distribution of scalp and body hair is normal. General Characteristics Temperature - normal warmth is noted.  Head and Neck Head-normocephalic, atraumatic  with no lesions or palpable masses. Face Global Assessment - atraumatic, no absence of expression. Neck Global Assessment - no abnormal movements, no bruit auscultated on the right, no bruit auscultated on the left, no decreased range of motion, non-tender. Trachea-midline. Thyroid Gland Characteristics - non-tender.  Eye Eyeball - Left-Extraocular movements intact, No Nystagmus - Left. Eyeball - Right-Extraocular movements intact, No Nystagmus - Right. Cornea - Left-No Hazy - Left. Cornea - Right-No Hazy - Right. Sclera/Conjunctiva - Left-No scleral icterus, No Discharge - Left. Sclera/Conjunctiva - Right-No scleral icterus, No Discharge - Right. Pupil - Left-Direct reaction to light normal. Pupil - Right-Direct reaction to light normal.  ENMT Ears Pinna - Left - no drainage observed, no generalized tenderness observed. Pinna - Right - no drainage observed, no generalized  tenderness observed. Nose and Sinuses External Inspection of the Nose - no destructive lesion observed. Inspection of the nares - Left - quiet respiration. Inspection of the nares - Right - quiet respiration. Mouth and Throat Lips - Upper Lip - no fissures observed, no pallor noted. Lower Lip - no fissures observed, no pallor noted. Nasopharynx - no discharge present. Oral Cavity/Oropharynx - Tongue - no dryness observed. Oral Mucosa - no cyanosis observed. Hypopharynx - no evidence of airway distress observed. Note: Oropharynx seems clear without any obvious plaques or pharyngitis.  Chest and Lung Exam Inspection Movements - Normal and Symmetrical. Accessory muscles - No use of accessory muscles in breathing. Palpation Palpation of the chest reveals - Non-tender. Auscultation Breath sounds - Normal and Clear.  Cardiovascular Auscultation Rhythm - Regular. Murmurs & Other Heart Sounds - Auscultation of the heart reveals - No Murmurs and No Systolic Clicks.  Abdomen Inspection Inspection of the abdomen reveals - No Visible peristalsis and No Abnormal pulsations. Umbilicus - No Bleeding, No Urine drainage. Palpation/Percussion Palpation and Percussion of the abdomen reveal - Soft, Non Tender, No Rebound tenderness, No Rigidity (guarding) and No Cutaneous hyperesthesia. Note: Abdomen soft. Nontender. Not distended. No umbilical or incisional hernias. No guarding. Laparoscopic incisions with normal healing ridges. No more lower quadrant or suprapubic pain  Drainage of right upper quadrant serosanguineous with low volume. 10-15 millimeters should day for the past week. I removed the drain. He tolerated well.  Male Genitourinary Sexual Maturity Tanner 5 - Adult hair pattern and Adult penile size and shape.  Peripheral Vascular Upper Extremity Inspection - Left - No Cyanotic nailbeds - Left, Not Ischemic. Inspection - Right - No Cyanotic nailbeds - Right, Not  Ischemic.  Neurologic Neurologic evaluation reveals -normal attention span and ability to concentrate, able to name objects and repeat phrases. Appropriate fund of knowledge , normal sensation and normal coordination. Mental Status Affect - not angry, not paranoid. Cranial Nerves-Normal Bilaterally. Gait-Normal.  Neuropsychiatric Mental status exam performed with findings of-able to articulate well with normal speech/language, rate, volume and coherence, thought content normal with ability to perform basic computations and apply abstract reasoning and no evidence of hallucinations, delusions, obsessions or homicidal/suicidal ideation.  Musculoskeletal Global Assessment Spine, Ribs and Pelvis - no instability, subluxation or laxity. Right Upper Extremity - no instability, subluxation or laxity.  Lymphatic Head & Neck  General Head & Neck Lymphatics: Bilateral - Description - No Localized lymphadenopathy. Axillary  General Axillary Region: Bilateral - Description - No Localized lymphadenopathy. Femoral & Inguinal  Generalized Femoral & Inguinal Lymphatics: Left - Description - No Localized lymphadenopathy. Right - Description - No Localized lymphadenopathy.    Assessment & Plan [name]; 05/13/2020 12:31 PM)  DIVERTICULITIS OF LARGE INTESTINE WITH PERFORATION WITHOUT BLEEDING (K57.20) Impression: Recovering status post remission with laparoscopic washout and drain of progressive diverticulitis abscess from sigmoid colon.  Complete Cipro and Flagyl. Last dose today. Restart if having worsening symptoms. CT scan repeat have worsened back on oral antibiotics.  Drain output was low-volume and serosanguineous and is markedly improved. Therefore I removed the drain.  Continue low fiber/soft diet this week. Can gradually advance to high-fiber diet until surgery. If worsening symptoms, stay on a low residue/soft diet until gets surgery.  At some point he  would benefit from surgery to remove this problem area of his colon. Sigmoid colectomy for Identically would wait about 6 weeks from last admission. That would allow a better chance of robotic minimally invasive sigmoid colectomy with immediate anastomosis, lowering the chance of conversion to open, need for colostomy, etc. abscess was primarily in the mesentery and not in the retroperitoneum, so I do not think we need to do any firefly or stenting or ostomy marking at this time.  Ideally he would get a colonoscopy the day before surgery to make sure there is no malignancy or other abnormalities. He is interested in getting this done in [place]. We'll try and set up with [facility].  There hoping to try and get this done to further develop [place] disappears June 1. We will try and set up in late June if possible. I did caution them may not be possible. We will see.  Current Plans Instructed to schedule colonoscopywith a gastroenterologist You are being scheduled for surgery- Our schedulers will call you.  You should hear from our office's scheduling department within 5 working days about the location, date, and time of surgery. We try to make accommodations for patient's preferences in scheduling surgery, but sometimes the OR schedule or the surgeon's schedule prevents [place] from making those accommodations.  If you have not heard from our office [phone]) in 5 working days, call the office and ask for your surgeon's nurse.  If you have other questions about your diagnosis, plan, or surgery, call the office and ask for your surgeon's nurse.   ABSCESS OF SIGMOID COLON DUE TO DIVERTICULITIS (K57.20) Impression: Improved status post washout and drainage. I removed the drains and serosanguineous. Reassuring.   PREOP COLON - ENCOUNTER FOR PREOPERATIVE EXAMINATION FOR GENERAL SURGICAL PROCEDURE (A35.573)  Current Plans Written instructions provided The anatomy &  physiology of the digestive tract was discussed. The pathophysiology of the colon was discussed. Natural history risks without surgery was discussed. I feel the risks of no intervention will lead to serious problems that outweigh the operative risks; therefore, I recommended a partial colectomy to remove the pathology. Minimally invasive (Robotic/Laparoscopic) & open techniques were discussed.  Risks such as bleeding, infection, abscess, leak, reoperation, possible ostomy, hernia, heart attack, death, and other risks were discussed. I noted a good likelihood this will help address the problem. Goals of post-operative recovery were discussed as well. Need for adequate nutrition, daily bowel regimen and healthy physical activity, to optimize recovery was noted as well. We will work to minimize complications. Educational materials were available as well. Questions were answered. The patient expresses understanding & wishes to proceed with surgery.  Pt Education - [facility] Colon Bowel Prep 2018 ERAS/Miralax/Antibiotics Started Neomycin Sulfate 500 MG Oral Tablet, 2 (two) Tablet SEE NOTE, #6, 05/13/2020, No Refill. Local Order: Pharmacist Notes: TAKE TWO TABLETS AT 2 PM, 3 PM, AND 10 PM THE DAY PRIOR TO SURGERY Started Flagyl  500 MG Oral Tablet, 2 (two) Tablet SEE NOTE, #6, 05/13/2020, No Refill. Local Order: Pharmacist Notes: Take at 2pm, 3pm, and 10pm the day prior to your colon operation Pt Education - Pamphlet Given - Laparoscopic Colorectal Surgery: discussed with patient and provided information. Pt Education - [facility] Colectomy post-op instructions: discussed with patient and provided information.  ORAL THRUSH (B37.0) Impression: Some odynophagia with some partial improvement on nystatin. Due to short course of fluconazole to see if that will help turn things around. Lozenges and mouthwash as needed.  Markedly worse mass gastrology evaluated. Hopefully not too likely at this  point  Current Plans Started Fluconazole 200 MG Oral Tablet, 1 (one) Tablet daily, 3 days starting 05/13/2020, Ref. x1.  ACUTE CHOLECYSTITIS WITH CHRONIC CHOLECYSTITIS (K81.2) Impression: Allowing status post laparoscopic cholecystectomy for acute on chronic cholecystitis. Recovering status post lap Cholecystectomy for acute and chronic cholecystitis. Pathology benign.  Current Plans Pt Education - Education: Pathology Report given to patient Pt Education - Laparoscopic Cholecystectomy: gallbladder  Adin Hector, MD, FACS, MASCRS Gastrointestinal and Minimally Invasive Surgery  [facility] [address] [place], [place] [address] [phone] Fax [phone] Main/Paging  CONTACT INFORMATION: Weekday (9AM-5PM) concerns: Call [facility] main office at [phone] Weeknight (5PM-9AM) or Weekend/Holiday concerns: Check [email or web address] for General Surgery [facility] coverage (Please, do not use SecureChat as it is not reliable communication to operating surgeons for immediate patient care)

## 2020-06-17 NOTE — Transfer of Care (Signed)
Immediate Anesthesia Transfer of Care Note  Patient: Jordan Buchanan  Procedure(s) Performed: ROBOTIC LOW ANTERIOR RESECTION, MOBILIZATION OF SPLENIC FLEXURE, ASSESSMENT OF TISSUE PERFUSION USING FIREFLY, BILATERAL TAP BLOCK (N/A Abdomen) RIGID PROCTOSCOPY (N/A )  Patient Location: PACU  Anesthesia Type:General  Level of Consciousness: drowsy, patient cooperative and responds to stimulation  Airway & Oxygen Therapy: Patient Spontanous Breathing and Patient connected to face mask oxygen  Post-op Assessment: Report given to RN and Post -op Vital signs reviewed and stable  Post vital signs: Reviewed and stable  Last Vitals:  Vitals Value Taken Time  BP 138/92 06/17/20 1812  Temp    Pulse 89 06/17/20 1814  Resp 21 06/17/20 1814  SpO2 96 % 06/17/20 1814  Vitals shown include unvalidated device data.  Last Pain:  Vitals:   06/17/20 1146  TempSrc:   PainSc: 0-No pain      Patients Stated Pain Goal: 3 (86/38/17 7116)  Complications: No complications documented.

## 2020-06-17 NOTE — Discharge Instructions (Signed)
SURGERY: POST OP INSTRUCTIONS (Surgery for small bowel obstruction, colon resection, etc)   ######################################################################  EAT Gradually transition to a high fiber diet with a fiber supplement over the next few days after discharge  WALK Walk an hour a day.  Control your pain to do that.    CONTROL PAIN Control pain so that you can walk, sleep, tolerate sneezing/coughing, go up/down stairs.  HAVE A BOWEL MOVEMENT DAILY Keep your bowels regular to avoid problems.  OK to try a laxative to override constipation.  OK to use an antidairrheal to slow down diarrhea.  Call if not better after 2 tries  CALL IF YOU HAVE PROBLEMS/CONCERNS Call if you are still struggling despite following these instructions. Call if you have concerns not answered by these instructions  ######################################################################   DIET Follow a light diet the first few days at home.  Start with a bland diet such as soups, liquids, starchy foods, low fat foods, etc.  If you feel full, bloated, or constipated, stay on a ful liquid or pureed/blenderized diet for a few days until you feel better and no longer constipated. Be sure to drink plenty of fluids every day to avoid getting dehydrated (feeling dizzy, not urinating, etc.). Gradually add a fiber supplement to your diet over the next week.  Gradually get back to a regular solid diet.  Avoid fast food or heavy meals the first week as you are more likely to get nauseated. It is expected for your digestive tract to need a few months to get back to normal.  It is common for your bowel movements and stools to be irregular.  You will have occasional bloating and cramping that should eventually fade away.  Until you are eating solid food normally, off all pain medications, and back to regular activities; your bowels will not be normal. Focus on eating a low-fat, high fiber diet the rest of your life  (See Getting to Good Bowel Health, below).  CARE of your INCISION or WOUND It is good for closed incision and even open wounds to be washed every day.  Shower every day.  Short baths are fine.  Wash the incisions and wounds clean with soap & water.    If you have a closed incision(s), wash the incision with soap & water every day.  You may leave closed incisions open to air if it is dry.   You may cover the incision with clean gauze & replace it after your daily shower for comfort. If you have skin tapes (Steristrips) or skin glue (Dermabond) on your incision, leave them in place.  They will fall off on their own like a scab.  You may trim any edges that curl up with clean scissors.  If you have staples, set up an appointment for them to be removed in the office in 10 days after surgery.  If you have a drain, wash around the skin exit site with soap & water and place a new dressing of gauze or band aid around the skin every day.  Keep the drain site clean & dry.    If you have an open wound with packing, see wound care instructions.  In general, it is encouraged that you remove your dressing and packing, shower with soap & water, and replace your dressing once a day.  Pack the wound with clean gauze moistened with normal (0.9%) saline to keep the wound moist & uninfected.  Pressure on the dressing for 30 minutes will stop most wound   bleeding.  Eventually your body will heal & pull the open wound closed over the next few months.  Raw open wounds will occasionally bleed or secrete yellow drainage until it heals closed.  Drain sites will drain a little until the drain is removed.  Even closed incisions can have mild bleeding or drainage the first few days until the skin edges scab over & seal.   If you have an open wound with a wound vac, see wound vac care instructions.     ACTIVITIES as tolerated Start light daily activities --- self-care, walking, climbing stairs-- beginning the day after surgery.   Gradually increase activities as tolerated.  Control your pain to be active.  Stop when you are tired.  Ideally, walk several times a day, eventually an hour a day.   Most people are back to most day-to-day activities in a few weeks.  It takes 4-8 weeks to get back to unrestricted, intense activity. If you can walk 30 minutes without difficulty, it is safe to try more intense activity such as jogging, treadmill, bicycling, low-impact aerobics, swimming, etc. Save the most intensive and strenuous activity for last (Usually 4-8 weeks after surgery) such as sit-ups, heavy lifting, contact sports, etc.  Refrain from any intense heavy lifting or straining until you are off narcotics for pain control.  You will have off days, but things should improve week-by-week. DO NOT PUSH THROUGH PAIN.  Let pain be your guide: If it hurts to do something, don't do it.  Pain is your body warning you to avoid that activity for another week until the pain goes down. You may drive when you are no longer taking narcotic prescription pain medication, you can comfortably wear a seatbelt, and you can safely make sudden turns/stops to protect yourself without hesitating due to pain. You may have sexual intercourse when it is comfortable. If it hurts to do something, stop.  MEDICATIONS Take your usually prescribed home medications unless otherwise directed.   Blood thinners:  Usually you can restart any strong blood thinners after the second postoperative day.  It is OK to take aspirin right away.     If you are on strong blood thinners (warfarin/Coumadin, Plavix, Xerelto, Eliquis, Pradaxa, etc), discuss with your surgeon, medicine PCP, and/or cardiologist for instructions on when to restart the blood thinner & if blood monitoring is needed (PT/INR blood check, etc).     PAIN CONTROL Pain after surgery or related to activity is often due to strain/injury to muscle, tendon, nerves and/or incisions.  This pain is usually  short-term and will improve in a few months.  To help speed the process of healing and to get back to regular activity more quickly, DO THE FOLLOWING THINGS TOGETHER: 1. Increase activity gradually.  DO NOT PUSH THROUGH PAIN 2. Use Ice and/or Heat 3. Try Gentle Massage and/or Stretching 4. Take over the counter pain medication 5. Take Narcotic prescription pain medication for more severe pain  Good pain control = faster recovery.  It is better to take more medicine to be more active than to stay in bed all day to avoid medications. 1.  Increase activity gradually Avoid heavy lifting at first, then increase to lifting as tolerated over the next 6 weeks. Do not "push through" the pain.  Listen to your body and avoid positions and maneuvers than reproduce the pain.  Wait a few days before trying something more intense Walking an hour a day is encouraged to help your body recover faster   and more safely.  Start slowly and stop when getting sore.  If you can walk 30 minutes without stopping or pain, you can try more intense activity (running, jogging, aerobics, cycling, swimming, treadmill, sex, sports, weightlifting, etc.) Remember: If it hurts to do it, then don't do it! 2. Use Ice and/or Heat You will have swelling and bruising around the incisions.  This will take several weeks to resolve. Ice packs or heating pads (6-8 times a day, 30-60 minutes at a time) will help sooth soreness & bruising. Some people prefer to use ice alone, heat alone, or alternate between ice & heat.  Experiment and see what works best for you.  Consider trying ice for the first few days to help decrease swelling and bruising; then, switch to heat to help relax sore spots and speed recovery. Shower every day.  Short baths are fine.  It feels good!  Keep the incisions and wounds clean with soap & water.   3. Try Gentle Massage and/or Stretching Massage at the area of pain many times a day Stop if you feel pain - do not  overdo it 4. Take over the counter pain medication This helps the muscle and nerve tissues become less irritable and calm down faster Choose ONE of the following over-the-counter anti-inflammatory medications: Acetaminophen 500mg tabs (Tylenol) 1-2 pills with every meal and just before bedtime (avoid if you have liver problems or if you have acetaminophen in you narcotic prescription) Naproxen 220mg tabs (ex. Aleve, Naprosyn) 1-2 pills twice a day (avoid if you have kidney, stomach, IBD, or bleeding problems) Ibuprofen 200mg tabs (ex. Advil, Motrin) 3-4 pills with every meal and just before bedtime (avoid if you have kidney, stomach, IBD, or bleeding problems) Take with food/snack several times a day as directed for at least 2 weeks to help keep pain / soreness down & more manageable. 5. Take Narcotic prescription pain medication for more severe pain A prescription for strong pain control is often given to you upon discharge (for example: oxycodone/Percocet, hydrocodone/Norco/Vicodin, or tramadol/Ultram) Take your pain medication as prescribed. Be mindful that most narcotic prescriptions contain Tylenol (acetaminophen) as well - avoid taking too much Tylenol. If you are having problems/concerns with the prescription medicine (does not control pain, nausea, vomiting, rash, itching, etc.), please call us [phone] to see if we need to switch you to a different pain medicine that will work better for you and/or control your side effects better. If you need a refill on your pain medication, you must call the office before 4 pm and on weekdays only.  By federal law, prescriptions for narcotics cannot be called into a pharmacy.  They must be filled out on paper & picked up from our office by the patient or authorized caretaker.  Prescriptions cannot be filled after 4 pm nor on weekends.    WHEN TO CALL US [phone] Severe uncontrolled or worsening pain  Fever over 101 F (38.5 C) Concerns with  the incision: Worsening pain, redness, rash/hives, swelling, bleeding, or drainage Reactions / problems with new medications (itching, rash, hives, nausea, etc.) Nausea and/or vomiting Difficulty urinating Difficulty breathing Worsening fatigue, dizziness, lightheadedness, blurred vision Other concerns If you are not getting better after two weeks or are noticing you are getting worse, contact our office [phone] for further advice.  We may need to adjust your medications, re-evaluate you in the office, send you to the emergency room, or see what other things we can do to help. The   clinic staff is available to answer your questions during regular business hours (8:30am-5pm).  Please don't hesitate to call and ask to speak to one of our nurses for clinical concerns.    A surgeon from [facility] is always on call at the hospitals 24 hours/day If you have a medical emergency, go to the nearest emergency room or call 911.  FOLLOW UP in our office One the day of your discharge from the hospital (or the next business weekday), please call [facility] to set up or confirm an appointment to see your surgeon in the office for a follow-up appointment.  Usually it is 2-3 weeks after your surgery.   If you have skin staples at your incision(s), let the office know so we can set up a time in the office for the nurse to remove them (usually around 10 days after surgery). Make sure that you call for appointments the day of discharge (or the next business weekday) from the hospital to ensure a convenient appointment time. IF YOU HAVE DISABILITY OR FAMILY LEAVE FORMS, BRING THEM TO THE OFFICE FOR PROCESSING.  DO NOT GIVE THEM TO YOUR DOCTOR.  [facility] [address], [address], [place], [place]  [address] ? [phone] - Main [phone] - Toll Free,  [phone] - Fax [email or web address]  GETTING TO GOOD BOWEL HEALTH. It is  expected for your digestive tract to need a few months to get back to normal.  It is common for your bowel movements and stools to be irregular.  You will have occasional bloating and cramping that should eventually fade away.  Until you are eating solid food normally, off all pain medications, and back to regular activities; your bowels will not be normal.   Avoiding constipation The goal: ONE SOFT BOWEL MOVEMENT A DAY!    Drink plenty of fluids.  Choose water first. TAKE A FIBER SUPPLEMENT EVERY DAY THE REST OF YOUR LIFE During your first week back home, gradually add back a fiber supplement every day Experiment which form you can tolerate.   There are many forms such as powders, tablets, wafers, gummies, etc Psyllium bran (Metamucil), methylcellulose (Citrucel), Miralax or Glycolax, Benefiber, Flax Seed.  Adjust the dose week-by-week (1/2 dose/day to 6 doses a day) until you are moving your bowels 1-2 times a day.  Cut back the dose or try a different fiber product if it is giving you problems such as diarrhea or bloating. Sometimes a laxative is needed to help jump-start bowels if constipated until the fiber supplement can help regulate your bowels.  If you are tolerating eating & you are farting, it is okay to try a gentle laxative such as double dose MiraLax, prune juice, or Milk of Magnesia.  Avoid using laxatives too often. Stool softeners can sometimes help counteract the constipating effects of narcotic pain medicines.  It can also cause diarrhea, so avoid using for too long. If you are still constipated despite taking fiber daily, eating solids, and a few doses of laxatives, call our office. Controlling diarrhea Try drinking liquids and eating bland foods for a few days to avoid stressing your intestines further. Avoid dairy products (especially milk & ice cream) for a short time.  The intestines often can lose the ability to digest lactose when stressed. Avoid foods that cause gassiness or  bloating.  Typical foods include beans and other legumes, cabbage, broccoli, and dairy foods.  Avoid greasy, spicy, fast foods.  Every person has   some sensitivity to other foods, so listen to your body and avoid those foods that trigger problems for you. Probiotics (such as active yogurt, Align, etc) may help repopulate the intestines and colon with normal bacteria and calm down a sensitive digestive tract Adding a fiber supplement gradually can help thicken stools by absorbing excess fluid and retrain the intestines to act more normally.  Slowly increase the dose over a few weeks.  Too much fiber too soon can backfire and cause cramping & bloating. It is okay to try and slow down diarrhea with a few doses of antidiarrheal medicines.   Bismuth subsalicylate (ex. Kayopectate, Pepto Bismol) for a few doses can help control diarrhea.  Avoid if pregnant.   Loperamide (Imodium) can slow down diarrhea.  Start with one tablet (2mg) first.  Avoid if you are having fevers or severe pain.  ILEOSTOMY PATIENTS WILL HAVE CHRONIC DIARRHEA since their colon is not in use.    Drink plenty of liquids.  You will need to drink even more glasses of water/liquid a day to avoid getting dehydrated. Record output from your ileostomy.  Expect to empty the bag every 3-4 hours at first.  Most people with a permanent ileostomy empty their bag 4-6 times at the least.   Use antidiarrheal medicine (especially Imodium) several times a day to avoid getting dehydrated.  Start with a dose at bedtime & breakfast.  Adjust up or down as needed.  Increase antidiarrheal medications as directed to avoid emptying the bag more than 8 times a day (every 3 hours). Work with your wound ostomy nurse to learn care for your ostomy.  See ostomy care instructions. TROUBLESHOOTING IRREGULAR BOWELS 1) Start with a soft & bland diet. No spicy, greasy, or fried foods.  2) Avoid gluten/wheat or dairy products from diet to see if symptoms improve. 3) Miralax  17gm or flax seed mixed in 8oz. water or juice-daily. May use 2-4 times a day as needed. 4) Gas-X, Phazyme, etc. as needed for gas & bloating.  5) Prilosec (omeprazole) over-the-counter as needed 6)  Consider probiotics (Align, Activa, etc) to help calm the bowels down  Call your doctor if you are getting worse or not getting better.  Sometimes further testing (cultures, endoscopy, X-ray studies, CT scans, bloodwork, etc.) may be needed to help diagnose and treat the cause of the diarrhea. [facility] [address], [address], [place], [place]  [address] [phone] - Main.    [phone]  - Toll Free.   [phone] - Fax [email or web address]    Diverticulosis  Diverticulosis is a condition that develops when small pouches (diverticula) form in the wall of the large intestine (colon). The colon is where water is absorbed and stool (feces) is formed. The pouches form when the inside layer of the colon pushes through weak spots in the outer layers of the colon. You may have a few pouches or many of them. The pouches usually do not cause problems unless they become inflamed or infected. When this happens, the condition is called diverticulitis. What are the causes? The cause of this condition is not known. What increases the risk? The following factors may make you more likely to develop this condition:  Being older than age 60. Your risk for this condition increases with age. Diverticulosis is rare among people younger than age 30. By age 80, many people have it.  Eating a low-fiber diet.  Having frequent constipation.  Being overweight.  Not getting enough exercise.    Smoking.  Taking over-the-counter pain medicines, like aspirin and ibuprofen.  Having a family history of diverticulosis. What are the signs or symptoms? In most people, there are no symptoms of this condition. If you do have symptoms, they may include:  Bloating.  Cramps  in the abdomen.  Constipation or diarrhea.  Pain in the lower left side of the abdomen. How is this diagnosed? Because diverticulosis usually has no symptoms, it is most often diagnosed during an exam for other colon problems. The condition may be diagnosed by:  Using a flexible scope to examine the colon (colonoscopy).  Taking an X-ray of the colon after dye has been put into the colon (barium enema).  Having a CT scan. How is this treated? You may not need treatment for this condition. Your health care provider may recommend treatment to prevent problems. You may need treatment if you have symptoms or if you previously had diverticulitis. Treatment may include:  Eating a high-fiber diet.  Taking a fiber supplement.  Taking a live bacteria supplement (probiotic).  Taking medicine to relax your colon. Follow these instructions at home: Medicines  Take over-the-counter and prescription medicines only as told by your health care provider.  If told by your health care provider, take a fiber supplement or probiotic. Constipation prevention Your condition may cause constipation. To prevent or treat constipation, you may need to:  Drink enough fluid to keep your urine pale yellow.  Take over-the-counter or prescription medicines.  Eat foods that are high in fiber, such as beans, whole grains, and fresh fruits and vegetables.  Limit foods that are high in fat and processed sugars, such as fried or sweet foods.  General instructions  Try not to strain when you have a bowel movement.  Keep all follow-up visits as told by your health care provider. This is important. Contact a health care provider if you:  Have pain in your abdomen.  Have bloating.  Have cramps.  Have not had a bowel movement in 3 days. Get help right away if:  Your pain gets worse.  Your bloating becomes very bad.  You have a fever or chills, and your symptoms suddenly get worse.  You  vomit.  You have bowel movements that are bloody or black.  You have bleeding from your rectum. Summary  Diverticulosis is a condition that develops when small pouches (diverticula) form in the wall of the large intestine (colon).  You may have a few pouches or many of them.  This condition is most often diagnosed during an exam for other colon problems.  Treatment may include increasing the fiber in your diet, taking supplements, or taking medicines. This information is not intended to replace advice given to you by your health care provider. Make sure you discuss any questions you have with your health care provider. Document Revised: 07/05/2019 Document Reviewed: 07/05/2019 Elsevier Patient Education  2020 [employer]  

## 2020-06-18 ENCOUNTER — Encounter (HOSPITAL_COMMUNITY): Payer: Self-pay | Admitting: Surgery

## 2020-06-18 LAB — BASIC METABOLIC PANEL
Anion gap: 10 (ref 5–15)
BUN: 9 mg/dL (ref 6–20)
CO2: 23 mmol/L (ref 22–32)
Calcium: 8.4 mg/dL — ABNORMAL LOW (ref 8.9–10.3)
Chloride: 103 mmol/L (ref 98–111)
Creatinine, Ser: 0.88 mg/dL (ref 0.61–1.24)
GFR calc Af Amer: >60 mL/min (ref >60)
GFR calc non Af Amer: >60 mL/min (ref >60)
Glucose, Bld: 129 mg/dL — ABNORMAL HIGH (ref 70–99)
Potassium: 3.9 mmol/L (ref 3.5–5.1)
Sodium: 136 mmol/L (ref 135–145)

## 2020-06-18 LAB — CBC
HCT: 40.7 % (ref 39.0–52.0)
Hemoglobin: 13.9 g/dL (ref 13.0–17.0)
MCH: 29.3 pg (ref 26.0–34.0)
MCHC: 34.2 g/dL (ref 30.0–36.0)
MCV: 85.9 fL (ref 80.0–100.0)
Platelets: 284 10*3/uL (ref 150–400)
RBC: 4.74 MIL/uL (ref 4.22–5.81)
RDW: 13 % (ref 11.5–15.5)
WBC: 10.9 10*3/uL — ABNORMAL HIGH (ref 4.0–10.5)
nRBC: 0 % (ref 0.0–0.2)

## 2020-06-18 LAB — MAGNESIUM: Magnesium: 1.6 mg/dL — ABNORMAL LOW (ref 1.7–2.4)

## 2020-06-18 MED ORDER — SODIUM CHLORIDE 0.9 % IV SOLN
25.0000 mg | Freq: Four times a day (QID) | INTRAVENOUS | Status: AC | PRN
  Administered 2020-06-18: 25 mg via INTRAVENOUS
  Filled 2020-06-18 (×3): qty 1

## 2020-06-18 MED ORDER — PSYLLIUM 95 % PO PACK
1.0000 | PACK | Freq: Two times a day (BID) | ORAL | Status: DC
  Administered 2020-06-18 – 2020-06-19 (×3): 1 via ORAL
  Filled 2020-06-18 (×5): qty 1

## 2020-06-18 NOTE — Progress Notes (Addendum)
Jordan Buchanan [id number] December 19, 1971  CARE TEAM:  PCP: Ria Bush, MD  Outpatient Care Team: [name], [name], [name], [name], [name], MD as Consulting Physician (General Surgery)  Inpatient Treatment Team: Treatment Team: Attending Provider: Michael Boston, MD; Registered Nurse: Kai Levins, RN; Registered Nurse: Steward Ros, RN   Problem List:   Principal Problem:   Diverticulitis left colon s/p robotic rectosigmoid resection LAR 06/17/2020 Active Problems:   Obesity, [name], BMI 30.0-34.9 (see actual BMI)   Diverticulitis of large intestine with perforation and abscess s/p lap drainage & washout 04/30/2020   1 Day Post-Op  06/17/2020  POST-OPERATIVE DIAGNOSIS:  LEFT SIDED DIVERTICULITIS  PROCEDURE:   ROBOTIC LOW ANTERIOR RECTOSIGMOID RESECTION MOBILIZATION OF SPLENIC FLEXURE ASSESSMENT OF TISSUE PERFUSION USING FIREFLY BILATERAL TAP BLOCK RIGID PROCTOSCOPY  SURGEON:  Adin Hector, MD  OR FINDINGS:   Patient had inflamed left-sided colon from mid descending colon down to proximal rectum consistent with diverticulitis.  No obvious metastatic disease on visceral parietal peritoneum or liver.  The anastomosis rests 12 cm from the anal verge by rigid proctoscopy.  Assessment  Recovering  [facility] Stay = 1 days)  Plan:  ERAS protocol Adv diet gradually ML IVF per protocol.  IVF bolus PRN D/C Foley F/u pathology.  Diverticulitis clinically Hiccups.  Thorazine as needed.  May be just early ileus.  Slowly on p.o. today until resolved. B blocker for HTN - hold if BP low -VTE prophylaxis- SCDs, etc -mobilize as tolerated to help recovery  25 minutes spent in review, evaluation, examination, counseling, and coordination of care.  I updated the patient's status to the patient and  spouse.  Recommendations were made.  Questions were answered.  They expressed understanding & appreciation.  06/18/2020    Subjective: (Chief complaint)  No major events  Wife in room.  Nursing just outside room.  Walked in room and then later in hallway.  Thinks that he is passing gas.  Does feel somewhat bloated with hiccups.  Tolerating clears.  Objective:  Vital signs:  Vitals:   06/17/20 2209 06/17/20 2307 06/18/20 0153 06/18/20 0608  BP: 132/88 (!) 133/91 (!) 136/91 (!) 127/93  Pulse: 96 91 90 85  Resp: 19 19 18 16   Temp: 98.5 F (36.9 C) 97.9 F (36.6 C) 97.7 F (36.5 C) 98.4 F (36.9 C)  TempSrc: Oral Oral Oral Oral  SpO2: 96% 95% 95% 94%  Weight:      Height:           Intake/Output   Yesterday:  06/29 0701 - 06/30 0700 In: 3104.1 [I.V.:3004.1; IV Piggyback:100] Out: 1030 [Urine:980; Blood:50] This shift:  No intake/output data recorded.  Bowel function:  Flatus: YES  BM:  No  Drain: (No drain)   Physical Exam:  General: Pt awake/alert in no acute distress Eyes: PERRL, normal EOM.  Sclera clear.  No icterus Neuro: CN II-XII intact w/o focal sensory/motor deficits. Lymph: No head/neck/groin lymphadenopathy Psych:  No delerium/psychosis/paranoia.  Oriented x 4 HENT: Normocephalic, Mucus membranes moist.  No thrush Neck: Supple, No tracheal deviation.  No obvious thyromegaly Chest: No pain to chest wall compression.  Good respiratory excursion.  No audible wheezing CV:  Pulses intact.  Regular rhythm.  No major extremity edema MS: Normal AROM mjr joints.  No obvious deformity  Abdomen: Soft.  Mildy distended.  Mildly tender  at incisions only.  No evidence of peritonitis.  No incarcerated hernias.  Ext:  No deformity.  No mjr edema.  No cyanosis Skin: No petechiae / purpurea.  No major sores.  Warm and dry    Results:   Cultures: Recent Results (from the past 720 hour(s))  SARS CORONAVIRUS 2 (TAT 6-24 HRS) Nasopharyngeal  Nasopharyngeal Swab     Status: None   Collection Time: 06/13/20  1:49 PM   Specimen: Nasopharyngeal Swab  Result Value Ref Range Status   SARS Coronavirus 2 NEGATIVE NEGATIVE Final    Comment: (NOTE) SARS-CoV-2 target nucleic acids are NOT DETECTED.  The SARS-CoV-2 RNA is generally detectable in upper and lower respiratory specimens during the acute phase of infection. Negative results do not preclude SARS-CoV-2 infection, do not rule out co-infections with other pathogens, and should not be used as the sole basis for treatment or other patient management decisions. Negative results must be combined with clinical observations, patient history, and epidemiological information. The expected result is Negative.  Fact Sheet for Patients: SugarRoll.be  Fact Sheet for Healthcare Providers: https://www.woods-mathews.com/  This test is not yet approved or cleared by the [place] FDA and  has been authorized for detection and/or diagnosis of SARS-CoV-2 by FDA under an Emergency Use Authorization (EUA). This EUA will remain  in effect (meaning this test can be used) for the duration of the COVID-19 declaration under Se ction 564(b)(1) of the Act, 21 U.S.C. section 360bbb-3(b)(1), unless the authorization is terminated or revoked sooner.  Performed at [facility], [place] [address], [place], [place] [address]     Labs: Results for orders placed or performed during the hospital encounter of 06/17/20 (from the past 48 hour(s))  ABO/Rh     Status: None   Collection Time: 06/17/20 11:14 AM  Result Value Ref Range   ABO/RH(D)      O POS Performed at [facility], [place] [address], [place], [place] [address]   Type and screen [place]     Status: None   Collection Time: 06/17/20 11:55 AM  Result Value Ref Range   ABO/RH(D) O POS    Antibody Screen NEG    Sample Expiration       06/20/2020,2359 Performed at [facility], [place] [address] [place], [place] [address]   Basic metabolic panel     Status: Abnormal   Collection Time: 06/18/20  5:06 AM  Result Value Ref Range   Sodium 136 135 - 145 mmol/L   Potassium 3.9 3.5 - 5.1 mmol/L   Chloride 103 98 - 111 mmol/L   CO2 23 22 - 32 mmol/L   Glucose, Bld 129 (H) 70 - 99 mg/dL    Comment: Glucose reference range applies only to samples taken after fasting for at least 8 hours.   BUN 9 6 - 20 mg/dL   Creatinine, Ser 0.88 0.61 - 1.24 mg/dL   Calcium 8.4 (L) 8.9 - 10.3 mg/dL   GFR calc non Af Amer >60 >60 mL/min   GFR calc Af Amer >60 >60 mL/min   Anion gap 10 5 - 15    Comment: Performed at [facility], [place] [address], [place], [place] [address]  CBC     Status: Abnormal   Collection Time: 06/18/20  5:06 AM  Result Value Ref Range   WBC 10.9 (H) 4.0 - 10.5 K/uL   RBC 4.74 4.22 - 5.81 MIL/uL   Hemoglobin 13.9 13.0 -  17.0 g/dL   HCT 40.7 39 - 52 %   MCV 85.9 80.0 - 100.0 fL   MCH 29.3 26.0 - 34.0 pg   MCHC 34.2 30.0 - 36.0 g/dL   RDW 13.0 11.5 - 15.5 %   Platelets 284 150 - 400 K/uL   nRBC 0.0 0.0 - 0.2 %    Comment: Performed at [facility], [place] [address], [place], [place] [address]  Magnesium     Status: Abnormal   Collection Time: 06/18/20  5:06 AM  Result Value Ref Range   Magnesium 1.6 (L) 1.7 - 2.4 mg/dL    Comment: Performed at [facility], [place] [address], [place], [place] [address]    Imaging / Studies: No results found.  Medications / Allergies: per chart  Antibiotics: Anti-infectives (From admission, onward)   Start     Dose/Rate Route Frequency Ordered Stop   06/18/20 0200  cefoTEtan (CEFOTAN) 2 g in sodium chloride 0.9 % 100 mL IVPB        2 g 200 mL/hr over 30 Minutes Intravenous Every 12 hours 06/17/20 2002 06/18/20 0331   06/17/20 1400  neomycin (MYCIFRADIN) tablet 1,000 mg  Status:  Discontinued       "And"  Linked Group Details   1,000 mg Oral 3 times per day 06/17/20 1109 06/17/20 1119   06/17/20 1400  metroNIDAZOLE (FLAGYL) tablet 1,000 mg  Status:  Discontinued       "And" Linked Group Details   1,000 mg Oral 3 times per day 06/17/20 1109 06/17/20 1119   06/17/20 1115  cefoTEtan (CEFOTAN) 2 g in sodium chloride 0.9 % 100 mL IVPB        2 g 200 mL/hr over 30 Minutes Intravenous On call to O.R. 06/17/20 1109 06/17/20 1444        Note: Portions of this report may have been transcribed using voice recognition software. Every effort was made to ensure accuracy; however, inadvertent computerized transcription errors may be present.   Any transcriptional errors that result from this process are unintentional.    Adin Hector, MD, FACS, MASCRS Gastrointestinal and Minimally Invasive Surgery  [facility] [address], [place], [place] [address] [phone] Fax [phone] Main/Paging  CONTACT INFORMATION: Weekday (9AM-5PM) concerns: Call [facility] main office at [phone] Weeknight (5PM-9AM) or Weekend/Holiday concerns: Check [email or web address] for General Surgery [facility] coverage (Please, do not use SecureChat as it is not reliable communication to operating surgeons for immediate patient care)      06/18/2020  7:51 AM

## 2020-06-19 LAB — SURGICAL PATHOLOGY

## 2020-06-19 MED ORDER — GABAPENTIN 300 MG PO CAPS
300.0000 mg | ORAL_CAPSULE | Freq: Three times a day (TID) | ORAL | Status: DC
  Administered 2020-06-19 – 2020-06-20 (×4): 300 mg via ORAL
  Filled 2020-06-19 (×4): qty 1

## 2020-06-19 NOTE — Progress Notes (Signed)
Pharmacy Communication note regarding alvimopan (Entereg):  Bowel function:             Flatus: YES             BM:  YES  Will discontinue alvimopan per protocol.  Shela Commons, PharmD, BCPS 06/19/20 1:28 PM

## 2020-06-19 NOTE — Progress Notes (Signed)
Jordan Buchanan [id number] 26-May-1971  CARE TEAM:  PCP: Ria Bush, MD  Outpatient Care Team: [name], [name], [name], [name], [name], MD as Consulting Physician (General Surgery)  Inpatient Treatment Team: Treatment Team: Attending Provider: Michael Boston, MD; Utilization Review: Sindy Guadeloupe, RN; Registered Nurse: Tanda Rockers, RN; Social Worker: Lowella Curb, [place]   Problem List:   Principal Problem:   Diverticulitis left colon s/p robotic rectosigmoid resection LAR 06/17/2020 Active Problems:   Obesity, [name], BMI 30.0-34.9 (see actual BMI)   Diverticulitis of large intestine with perforation and abscess s/p lap drainage & washout 04/30/2020   2 Days Post-Op  06/17/2020  POST-OPERATIVE DIAGNOSIS:  LEFT SIDED DIVERTICULITIS  PROCEDURE:   ROBOTIC LOW ANTERIOR RECTOSIGMOID RESECTION MOBILIZATION OF SPLENIC FLEXURE ASSESSMENT OF TISSUE PERFUSION USING FIREFLY BILATERAL TAP BLOCK RIGID PROCTOSCOPY  SURGEON:  Adin Hector, MD  OR FINDINGS:   Patient had inflamed left-sided colon from mid descending colon down to proximal rectum consistent with diverticulitis.  No obvious metastatic disease on visceral parietal peritoneum or liver.  The anastomosis rests 12 cm from the anal verge by rigid proctoscopy.  Assessment  Recovering  [facility] Stay = 2 days)  Plan:  ERAS protocol Adv diet gradually ML IVF per protocol.  IVF bolus PRN D/C Foley F/u pathology.  Diverticulitis clinically ADAT  B blocker for HTN - hold if BP low VTE prophylaxis- SCDs, etc Mobilize as tolerated to help recovery  D/C patient from hospital when patient meets criteria (anticipate in 1-2 day(s)):  Tolerating oral intake well Ambulating well Adequate pain control without IV  medications Urinating  Having flatus Disposition planning in place   25 minutes spent in review, evaluation, examination, counseling, and coordination of care.  I updated the patient's status to the patient and spouse.  Recommendations were made.  Questions were answered.  They expressed understanding & appreciation.  06/19/2020    Subjective: (Chief complaint)  Passed a lot of flatus.  Feeling better.  Still sore.  Trying to walk more.  Objective:  Vital signs:  Vitals:   06/18/20 1414 06/18/20 2057 06/19/20 0500 06/19/20 0643  BP: 127/75 123/79  136/89  Pulse: 75 60  (!) 57  Resp: 17 19  20   Temp: 97.6 F (36.4 C) (!) 97.3 F (36.3 C)  97.7 F (36.5 C)  TempSrc: Oral Oral  Oral  SpO2: 96% 95%  93%  Weight:   105.8 kg   Height:        Last BM Date: 06/18/20  Intake/Output   Yesterday:  06/30 0701 - 07/01 0700 In: 925 [P.O.:900; IV Piggyback:25] Out: -  This shift:  No intake/output data recorded.  Bowel function:  Flatus: YES  BM:  YES  Drain: (No drain)   Physical Exam:  General: Pt awake/alert in no acute distress Eyes: PERRL, normal EOM.  Sclera clear.  No icterus Neuro: CN II-XII intact w/o focal sensory/motor deficits. Lymph: No head/neck/groin lymphadenopathy Psych:  No delerium/psychosis/paranoia.  Oriented x 4 HENT: Normocephalic, Mucus membranes moist.  No thrush Neck: Supple, No tracheal deviation.  No obvious thyromegaly Chest: No pain to chest wall compression.  Good respiratory excursion.  No audible wheezing CV:  Pulses intact.  Regular rhythm.  No major extremity edema MS: Normal AROM mjr joints.  No obvious deformity  Abdomen: Soft.  Mildy distended.  Mildly  tender at incisions only.  No evidence of peritonitis.  No incarcerated hernias.  Ext:  No deformity.  No mjr edema.  No cyanosis Skin: No petechiae / purpurea.  No major sores.  Warm and dry    Results:   Cultures: Recent Results (from the past 720 hour(s))  SARS  CORONAVIRUS 2 (TAT 6-24 HRS) Nasopharyngeal Nasopharyngeal Swab     Status: None   Collection Time: 06/13/20  1:49 PM   Specimen: Nasopharyngeal Swab  Result Value Ref Range Status   SARS Coronavirus 2 NEGATIVE NEGATIVE Final    Comment: (NOTE) SARS-CoV-2 target nucleic acids are NOT DETECTED.  The SARS-CoV-2 RNA is generally detectable in upper and lower respiratory specimens during the acute phase of infection. Negative results do not preclude SARS-CoV-2 infection, do not rule out co-infections with other pathogens, and should not be used as the sole basis for treatment or other patient management decisions. Negative results must be combined with clinical observations, patient history, and epidemiological information. The expected result is Negative.  Fact Sheet for Patients: SugarRoll.be  Fact Sheet for Healthcare Providers: https://www.woods-mathews.com/  This test is not yet approved or cleared by the [place] FDA and  has been authorized for detection and/or diagnosis of SARS-CoV-2 by FDA under an Emergency Use Authorization (EUA). This EUA will remain  in effect (meaning this test can be used) for the duration of the COVID-19 declaration under Se ction 564(b)(1) of the Act, 21 U.S.C. section 360bbb-3(b)(1), unless the authorization is terminated or revoked sooner.  Performed at [facility], [place] [address], [place], [place] [address]     Labs: Results for orders placed or performed during the hospital encounter of 06/17/20 (from the past 48 hour(s))  ABO/Rh     Status: None   Collection Time: 06/17/20 11:14 AM  Result Value Ref Range   ABO/RH(D)      O POS Performed at [facility], [place] [address] [place], [place] [address]   Type and screen [place]     Status: None   Collection Time: 06/17/20 11:55 AM  Result Value Ref Range   ABO/RH(D) O POS    Antibody Screen NEG     Sample Expiration      06/20/2020,2359 Performed at [facility], [place] [address] [place], [place] [address]   Basic metabolic panel     Status: Abnormal   Collection Time: 06/18/20  5:06 AM  Result Value Ref Range   Sodium 136 135 - 145 mmol/L   Potassium 3.9 3.5 - 5.1 mmol/L   Chloride 103 98 - 111 mmol/L   CO2 23 22 - 32 mmol/L   Glucose, Bld 129 (H) 70 - 99 mg/dL    Comment: Glucose reference range applies only to samples taken after fasting for at least 8 hours.   BUN 9 6 - 20 mg/dL   Creatinine, Ser 0.88 0.61 - 1.24 mg/dL   Calcium 8.4 (L) 8.9 - 10.3 mg/dL   GFR calc non Af Amer >60 >60 mL/min   GFR calc Af Amer >60 >60 mL/min   Anion gap 10 5 - 15    Comment: Performed at [facility], [place] [address], [place], [place] [address]  CBC     Status: Abnormal   Collection Time: 06/18/20  5:06 AM  Result Value Ref Range   WBC 10.9 (H) 4.0 - 10.5 K/uL   RBC 4.74 4.22 - 5.81 MIL/uL   Hemoglobin 13.9  13.0 - 17.0 g/dL   HCT 40.7 39 - 52 %   MCV 85.9 80.0 - 100.0 fL   MCH 29.3 26.0 - 34.0 pg   MCHC 34.2 30.0 - 36.0 g/dL   RDW 13.0 11.5 - 15.5 %   Platelets 284 150 - 400 K/uL   nRBC 0.0 0.0 - 0.2 %    Comment: Performed at [facility], [place] [address], [place], [place] [address]  Magnesium     Status: Abnormal   Collection Time: 06/18/20  5:06 AM  Result Value Ref Range   Magnesium 1.6 (L) 1.7 - 2.4 mg/dL    Comment: Performed at [facility], [place] [address] [place], [place] [address]    Imaging / Studies: No results found.  Medications / Allergies: per chart  Antibiotics: Anti-infectives (From admission, onward)   Start     Dose/Rate Route Frequency Ordered Stop   06/18/20 0200  cefoTEtan (CEFOTAN) 2 g in sodium chloride 0.9 % 100 mL IVPB        2 g 200 mL/hr over 30 Minutes Intravenous Every 12 hours 06/17/20 2002 06/18/20 0331   06/17/20 1400  neomycin (MYCIFRADIN) tablet 1,000 mg  Status:   Discontinued       "And" Linked Group Details   1,000 mg Oral 3 times per day 06/17/20 1109 06/17/20 1119   06/17/20 1400  metroNIDAZOLE (FLAGYL) tablet 1,000 mg  Status:  Discontinued       "And" Linked Group Details   1,000 mg Oral 3 times per day 06/17/20 1109 06/17/20 1119   06/17/20 1115  cefoTEtan (CEFOTAN) 2 g in sodium chloride 0.9 % 100 mL IVPB        2 g 200 mL/hr over 30 Minutes Intravenous On call to O.R. 06/17/20 1109 06/17/20 1444        Note: Portions of this report may have been transcribed using voice recognition software. Every effort was made to ensure accuracy; however, inadvertent computerized transcription errors may be present.   Any transcriptional errors that result from this process are unintentional.    Adin Hector, MD, FACS, MASCRS Gastrointestinal and Minimally Invasive Surgery  [facility] [address] [place], [place] [address] [phone] Fax [phone] Main/Paging  CONTACT INFORMATION: Weekday (9AM-5PM) concerns: Call [facility] main office at [phone] Weeknight (5PM-9AM) or Weekend/Holiday concerns: Check [email or web address] for General Surgery [facility] coverage (Please, do not use SecureChat as it is not reliable communication to operating surgeons for immediate patient care)      06/19/2020  8:43 AM

## 2020-06-20 MED ORDER — TRAMADOL HCL 50 MG PO TABS: 50.0000 mg | ORAL_TABLET | Freq: Four times a day (QID) | ORAL | 0 refills | Status: DC | PRN

## 2020-06-20 NOTE — Discharge Summary (Signed)
Physician Discharge Summary    Patient ID: Jordan Buchanan MRN: [id number] DOB/AGE: [birth year]  49 y.o.  [name], [name], [name], [name], [name], MD as Consulting Physician (General Surgery)  Admit date: 06/17/2020  Discharge date: 06/20/2020  Hospital Stay = 3 days    Discharge Diagnoses:  Principal Problem:   Diverticulitis left colon s/p robotic rectosigmoid resection LAR 06/17/2020 Active Problems:   Obesity, [name], BMI 30.0-34.9 (see actual BMI)   Diverticulitis of large intestine with perforation and abscess s/p lap drainage & washout 04/30/2020   3 Days Post-Op  06/17/2020  POST-OPERATIVE DIAGNOSIS:  LEFT SIDED DIVERTICULITIS  PROCEDURE:   ROBOTIC LOW ANTERIOR RECTOSIGMOID RESECTION MOBILIZATION OF SPLENIC FLEXURE ASSESSMENT OF TISSUE PERFUSION USING FIREFLY BILATERAL TAP BLOCK RIGID PROCTOSCOPY  SURGEON:  Adin Hector, [name]  Surgical Pathology Report   Clinical History: Diverticulitis   FINAL MICROSCOPIC DIAGNOSIS:  A. RECTOSIGMOID COLON, RESECTION:  - Diverticulosis with acute diverticulitis and abscess formation.  - One morphologically benign lymph node.   B. FINAL DISTAL RING, EXCISION:  - Unremarkable colonic tissue.   Consults: None  Hospital Course:   The patient underwent the surgery above.  Postoperatively, the patient gradually mobilized and advanced to a solid diet.  Pain and other symptoms were treated aggressively.    By the time of discharge, the patient was walking well the hallways, eating food, having flatus.  Pain was well-controlled on an oral medications.  Based on meeting discharge criteria and continuing to recover, I felt it was safe for the patient to be discharged from the hospital to further  recover with close followup. Postoperative recommendations were discussed in detail.  They are written as well.  Discharged Condition: good  Discharge Exam: Blood pressure 137/86, pulse 67, temperature 98 F (36.7 C), temperature source Oral, resp. rate 16, height _0  (1.778 m), weight 105.4 kg, SpO2 95 %.  General: Pt awake/alert/oriented x4 in No acute distress Eyes: PERRL, normal EOM.  Sclera clear.  No icterus Neuro: CN II-XII intact w/o focal sensory/motor deficits. Lymph: No head/neck/groin lymphadenopathy Psych:  No delerium/psychosis/paranoia HENT: Normocephalic, Mucus membranes moist.  No thrush Neck: Supple, No tracheal deviation Chest: No chest wall pain w good excursion CV:  Pulses intact.  Regular rhythm MS: Normal AROM mjr joints.  No obvious deformity Abdomen: Soft.  Nondistended.  Mildly tender at incisions only.  No evidence of peritonitis.  No incarcerated hernias. Ext:  SCDs BLE.  No mjr edema.  No cyanosis Skin: No petechiae / purpura   Disposition:    Follow-up Information    Michael Boston, MD. Schedule an appointment as soon as possible for a visit in 3 weeks.   Specialty: General Surgery Why: To follow up after your operation, To follow up after your hospital stay Contact information: [place] [address] [phone]               Discharge disposition: 01-Home or Self Care       Discharge Instructions    Call MD for:   Complete by: As directed    FEVER > 101.5 F  (temperatures < 101.5 F are not significant)   Call MD for:  extreme fatigue   Complete by: As directed    Call MD for:  persistant  dizziness or light-headedness   Complete by: As directed    Call MD for:  persistant nausea and vomiting   Complete by: As directed    Call MD for:  redness, tenderness, or signs of infection (pain, swelling, redness, odor or green/yellow discharge around incision site)   Complete by: As directed    Call MD for:   severe uncontrolled pain   Complete by: As directed    Diet - low sodium heart healthy   Complete by: As directed    Start with a bland diet such as soups, liquids, starchy foods, low fat foods, etc. the first few days at home. Gradually advance to a solid, low-fat, high fiber diet by the end of the first week at home.   Add a fiber supplement to your diet (Metamucil, etc) If you feel full, bloated, or constipated, stay on a full liquid or pureed/blenderized diet for a few days until you feel better and are no longer constipated.   Discharge instructions   Complete by: As directed    See Discharge Instructions If you are not getting better after two weeks or are noticing you are getting worse, contact our office [phone] for further advice.  We may need to adjust your medications, re-evaluate you in the office, send you to the emergency room, or see what other things we can do to help. The clinic staff is available to answer your questions during regular business hours (8:30am-5pm).  Please don't hesitate to call and ask to speak to one of our nurses for clinical concerns.    A surgeon from [facility] is always on call at the hospitals 24 hours/day If you have a medical emergency, go to the nearest emergency room or call 911.   Discharge wound care:   Complete by: As directed    It is good for closed incisions and even open wounds to be washed every day.  Shower every day.  Short baths are fine.  Wash the incisions and wounds clean with soap & water.    You may leave closed incisions open to air if it is dry.   You may cover the incision with clean gauze & replace it after your daily shower for comfort.   Driving Restrictions   Complete by: As directed    You may drive when: - you are no longer taking narcotic prescription pain medication - you can comfortably wear a seatbelt - you can safely make sudden turns/stops without pain.   Increase activity slowly   Complete by:  As directed    Start light daily activities --- self-care, walking, climbing stairs- beginning the day after surgery.  Gradually increase activities as tolerated.  Control your pain to be active.  Stop when you are tired.  Ideally, walk several times a day, eventually an hour a day.   Most people are back to most day-to-day activities in a few weeks.  It takes 4-6 weeks to get back to unrestricted, intense activity. If you can walk 30 minutes without difficulty, it is safe to try more intense activity such as jogging, treadmill, bicycling, low-impact aerobics, swimming, etc. Save the most intensive and strenuous activity for last (Usually 4-8 weeks after surgery) such as sit-ups, heavy lifting, contact sports, etc.  Refrain from any intense heavy lifting or straining until you are off narcotics for pain control.  You will have off days, but things should improve week-by-week. DO NOT PUSH THROUGH PAIN.  Let pain be your guide:  If it hurts to do something, don't do it.   Lifting restrictions   Complete by: As directed    If you can walk 30 minutes without difficulty, it is safe to try more intense activity such as jogging, treadmill, bicycling, low-impact aerobics, swimming, etc. Save the most intensive and strenuous activity for last (Usually 4-8 weeks after surgery) such as sit-ups, heavy lifting, contact sports, etc.   Refrain from any intense heavy lifting or straining until you are off narcotics for pain control.  You will have off days, but things should improve week-by-week. DO NOT PUSH THROUGH PAIN.  Let pain be your guide: If it hurts to do something, don't do it.  Pain is your body warning you to avoid that activity for another week until the pain goes down.   May shower / Bathe   Complete by: As directed    May walk up steps   Complete by: As directed    Remove dressing in 72 hours   Complete by: As directed    Make sure all dressings are removed by the third day after surgery.  Leave  incisions open to air.  OK to cover incisions with gauze or bandages as desired   Sexual Activity Restrictions   Complete by: As directed    You may have sexual intercourse when it is comfortable. If it hurts to do something, stop.      Allergies as of 06/20/2020      Reactions   Hctz [hydrochlorothiazide] Rash   Per derm rec stay off HCTZ (2018)      Medication List    TAKE these medications   albuterol 108 (90 Base) MCG/ACT inhaler Commonly known as: Ventolin HFA Inhale 2 puffs into the lungs every 4 (four) hours as needed for wheezing or shortness of breath.   amLODipine 10 MG tablet Commonly known as: NORVASC Take 1 tablet (10 mg total) by mouth daily.   cetirizine 10 MG tablet Commonly known as: ZYRTEC Take 10 mg by mouth daily.   metoprolol succinate 50 MG 24 hr tablet Commonly known as: TOPROL-XL Take 1 tablet (50 mg total) by mouth daily. Take with or immediately following a meal.   omega-3 acid ethyl esters 1 g capsule Commonly known as: LOVAZA Take 1 g by mouth daily.   pantoprazole 40 MG tablet Commonly known as: PROTONIX Take 40 mg by mouth 2 (two) times daily.   traMADol 50 MG tablet Commonly known as: ULTRAM Take 1-2 tablets (50-100 mg total) by mouth every 6 (six) hours as needed for moderate pain or severe pain (mild pain).            Discharge Care Instructions  (From admission, onward)         Start     Ordered   06/20/20 0000  Discharge wound care:       Comments: It is good for closed incisions and even open wounds to be washed every day.  Shower every day.  Short baths are fine.  Wash the incisions and wounds clean with soap & water.    You may leave closed incisions open to air if it is dry.   You may cover the incision with clean gauze & replace it after your daily shower for comfort.   06/20/20 0809          Significant Diagnostic Studies:  SURGICAL PATHOLOGY  CASE: [id number]  PATIENT: Jordan Buchanan  Surgical Pathology  Report      Clinical History:  Diverticulitis      FINAL MICROSCOPIC DIAGNOSIS:   A. RECTOSIGMOID COLON, RESECTION:  - Diverticulosis with acute diverticulitis and abscess formation.  - One morphologically benign lymph node.   B. FINAL DISTAL RING, EXCISION:  - Unremarkable colonic tissue.   Brentt Fread DESCRIPTION:   A: The specimen is received fresh labeled rectosigmoid colon and  consists of an unoriented segment of colon with one opened and one  stapled resection margin. The specimen measures 19.5 cm in length. The  serosal surface is tan-pink, with attached tan-yellow adipose tissue.  There is a 2.5 x 2.0 cm area of tan-brown, roughened, indurated adipose  tissue within the midportion of the colon. The lumen is filled with a  minimal amount of tan-brown fecal material. The mucosa is tan-pink,  with normal folding and multiple diverticula. The adipose tissue in the  area of induration is grossly consistent with abscess formation. The  wall ranges from 0.2 to 0.7 cm in thickness. Enlarged lymph nodes are  not grossly identified. Representative sections are submitted in 6  cassettes.  1 = open resection margin.  2-3 = distal resection margin.  4 = diverticulum.  5-6 = abscess formation.   B: The specimen is received fresh labeled final distal ring and consists  of a circular piece of tan-pink mucosa measuring 2.5 x 1.8 x 1.0 cm.  The specimen displays an embedded staple line. Representative sections  are submitted in 1 cassette. Craig Staggers 06/19/2020)     Final Diagnosis performed by Gillie Manners, MD.  Electronically  signed 06/19/2020  Technical component performed at [facility], [place]  [address] [place], [place] [address].  Professional component performed at [employer]. [facility],  [place] [address], [place], [place] [address].  Immunohistochemistry Technical component (if applicable) was performed  at [facility].  [address] [place],  [place], [place] [address].  IMMUNOHISTOCHEMISTRY DISCLAIMER (if applicable):  Some of these immunohistochemical stains may have been developed and the  performance characteristics determine by [facility]. Some  may not have been cleared or approved by the U.S. Food and Drug  Administration. The FDA has determined that such clearance or approval  is not necessary. This test is used for clinical purposes. It should not  be regarded as investigational or for research. This laboratory is  certified under the [place]  (CLIA-88) as qualified to perform high complexity clinical laboratory  testing. The controls stained appropriately.   Results for orders placed or performed during the hospital encounter of 06/17/20 (from the past 72 hour(s))  ABO/Rh     Status: None   Collection Time: 06/17/20 11:14 AM  Result Value Ref Range   ABO/RH(D)      O POS Performed at [facility], [place] [address] [place], [place] [address]   Type and screen [place]     Status: None   Collection Time: 06/17/20 11:55 AM  Result Value Ref Range   ABO/RH(D) O POS    Antibody Screen NEG    Sample Expiration      06/20/2020,2359 Performed at [facility], [place] [address] [place],  [address]   Surgical pathology     Status: None   Collection Time: 06/17/20  3:20 PM  Result Value Ref Range   SURGICAL PATHOLOGY      SURGICAL PATHOLOGY CASE: [id number] PATIENT: Jordan Buchanan Surgical Pathology Report     Clinical History: Diverticulitis  FINAL MICROSCOPIC DIAGNOSIS:  A. RECTOSIGMOID COLON, RESECTION: - Diverticulosis with acute diverticulitis and abscess formation. - One morphologically benign lymph node.  B. FINAL DISTAL RING, EXCISION: - Unremarkable colonic tissue.  Vester Balthazor DESCRIPTION:  A: The specimen is received fresh labeled rectosigmoid  colon and consists of an unoriented segment of colon with one opened and one stapled resection margin.  The specimen measures 19.5 cm in length.  The serosal surface is tan-pink, with attached tan-yellow adipose tissue. There is a 2.5 x 2.0 cm area of tan-brown, roughened, indurated adipose tissue within the midportion of the colon.  The lumen is filled with a minimal amount of tan-brown fecal material.  The mucosa is tan-pink, with normal folding and multiple diverticula.  The adipose tissue in the area of induration is g rossly consistent with abscess formation.  The wall ranges from 0.2 to 0.7 cm in thickness.  Enlarged lymph nodes are not grossly identified.  Representative sections are submitted in 6 cassettes. 1 = open resection margin. 2-3 = distal resection margin. 4 = diverticulum. 5-6 = abscess formation.  B: The specimen is received fresh labeled final distal ring and consists of a circular piece of tan-pink mucosa measuring 2.5 x 1.8 x 1.0 cm. The specimen displays an embedded staple line.  Representative sections are submitted in 1 cassette.  Craig Staggers 06/19/2020)    Final Diagnosis performed by Gillie Manners, MD.   Electronically signed 06/19/2020 Technical component performed at [facility], [place] [address], [place], [place] [address].  Professional component performed at [employer]. [facility], [place] [address], [place], [place] [address].  Immunohistochemistry Technical component (if applicable) was performed at [facility]. [place], [place], [place], [place] [address].   IMMUNOHISTOCHEMISTRY DISCLAIMER (if applicable): Some of these immunohistochemical stains may have been developed and the performance characteristics determine by [facility]. Some may not have been cleared or approved by the U.S. Food and Drug Administration. The FDA has determined that such clearance or approval is not necessary. This  test is used for clinical purposes. It should not be regarded as investigational or for research. This laboratory is certified under the [place] (CLIA-88) as qualified to perform high complexity clinical laboratory testing.  The controls stained appropriately.   Basic metabolic panel     Status: Abnormal   Collection Time: 06/18/20  5:06 AM  Result Value Ref Range   Sodium 136 135 - 145 mmol/L   Potassium 3.9 3.5 - 5.1 mmol/L   Chloride 103 98 - 111 mmol/L   CO2 23 22 - 32 mmol/L   Glucose, Bld 129 (H) 70 - 99 mg/dL    Comment: Glucose reference range applies only to samples taken after fasting for at least 8 hours.   BUN 9 6 - 20 mg/dL   Creatinine, Ser 0.88 0.61 - 1.24 mg/dL   Calcium 8.4 (L) 8.9 - 10.3 mg/dL   GFR calc non Af Amer >60 >60 mL/min   GFR calc Af Amer >60 >60 mL/min   Anion gap 10 5 - 15    Comment: Performed at [facility], [place] [address] [place],  [address]  CBC     Status: Abnormal   Collection Time: 06/18/20  5:06 AM  Result Value Ref Range   WBC 10.9 (H) 4.0 - 10.5 K/uL   RBC 4.74 4.22 - 5.81 MIL/uL   Hemoglobin 13.9 13.0 - 17.0 g/dL   HCT 40.7  39 - 52 %   MCV 85.9 80.0 - 100.0 fL   MCH 29.3 26.0 - 34.0 pg   MCHC 34.2 30.0 - 36.0 g/dL   RDW 13.0 11.5 - 15.5 %   Platelets 284 150 - 400 K/uL   nRBC 0.0 0.0 - 0.2 %    Comment: Performed at [facility], [place] [address] [place], [place] [address]  Magnesium     Status: Abnormal   Collection Time: 06/18/20  5:06 AM  Result Value Ref Range   Magnesium 1.6 (L) 1.7 - 2.4 mg/dL    Comment: Performed at [facility], [place] [address] [place], [place] [address]    No results found.  Past Medical History:  Diagnosis Date  . Arrhythmia    PALPITATIONS AND PVC - improved with cutting down on caffeine  . Cough variant asthma 05/28/2014   05/27/2014 p extensive coaching HFA effectiveness =    75% > try dulera  100 2bid  - PFT's 07/03/2014 FEV1 4.15 (98%) ratio 89 and no chang p B2 and nl fef25-75  And nl dlco    . Diverticulosis 07/2015   by CT scan  . GERD (gastroesophageal reflux disease)   . Hepatic steatosis   . Hypertension    h/o LVH, resolved  . Migraines   . Perforated diverticulum of large intestine 03/2020   hospitalization  . Pneumonia   . Prostatitis 05/09/2013  . Seasonal allergies   . 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 Estanislado Pandy)  . Tremor   . Urticaria     Past Surgical History:  Procedure Laterality Date  . CHOLECYSTECTOMY N/A 04/30/2020   Procedure: LAPAROSCOPIC CHOLECYSTECTOMY WITH INTRAOPERATIVE CHOLANGIOGRAM, BILATERAL TAP BLOCK;  Surgeon: Michael Boston, MD;  Location: [facility];  Service: General;  Laterality: N/A;  . ESOPHAGOGASTRODUODENOSCOPY  08/2018   esophagus dilated, biopsy showed chronic gastritis with mod reflux changes [facility])  . LAPAROSCOPY N/A 04/30/2020   Procedure: LAPAROSCOPY DIAGNOSTIC WITH WASHOUT AND DRAINAGE PLACEMENT, DRAINAGE OF DIVERTICULITIS ABSCESS;  Surgeon: Michael Boston, MD;  Location: [facility];  Service: General;  Laterality: N/A;  . NASAL RECONSTRUCTION WITH SEPTAL REPAIR  2017   [place]  . OTHER SURGICAL HISTORY  1992   facial surgery  . PROCTOSCOPY N/A 06/17/2020   Procedure: RIGID PROCTOSCOPY;  Surgeon: Michael Boston, MD;  Location: [facility];  Service: General;  Laterality: N/A;  . ROBOTIC LOW ANTERIOR RECTOSIGMOID RESECTION  06/17/2020   for recurrent L sided diverticulitis (Kennith Morss)  . ROTATOR CUFF REPAIR  2009   left  . SINOSCOPY    . UMBILICAL HERNIA REPAIR  05/10/2012   Procedure: HERNIA REPAIR UMBILICAL ADULT;  Surgeon: Harl Bowie, MD;  Location: [facility];  Service: General;  Laterality: N/A;    Social History   Socioeconomic History  . Marital status: Married    Spouse name: Not on file  . Number of children: 2  . Years of education: Not on file  . Highest education  level: Not on file  Occupational History  . Occupation: golf course maintenance    Employer: [employer]  Tobacco Use  . Smoking status: Former Smoker    Packs/day: 1.00    Years: 13.00    Pack years: 13.00    Types: Cigarettes    Quit date: 12/21/1999    Years since quitting: 20.5  . Smokeless tobacco: Former Systems developer    Types: Snuff    Quit date:  12/20/2013  Vaping Use  . Vaping Use: Never used  Substance and Sexual Activity  . Alcohol use: No    Alcohol/week: 0.0 standard drinks    Comment: quit 2002  . Drug use: Not Currently    Types: Marijuana    Comment: quit 2002  . Sexual activity: Not on file  Other Topics Concern  . 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 Difficulty of Paying Living Expenses:   Food Insecurity:   . Worried About Charity fundraiser in the Last Year:   . Arboriculturist in the Last Year:   Transportation Needs:   . Film/video editor (Medical):   Marland Kitchen Lack of Transportation (Non-Medical):   Physical Activity:   . Days of Exercise per Week:   . Minutes of Exercise per Session:   Stress:   . Feeling of Stress :   Social Connections:   . Frequency of Communication with Friends and Family:   . Frequency of Social Gatherings with Friends and Family:   . Attends Religious Services:   . Active Member of Clubs or Organizations:   . Attends Archivist Meetings:   Marland Kitchen Marital Status:   Intimate Partner Violence:   . Fear of Current or Ex-Partner:   . Emotionally Abused:   Marland Kitchen Physically Abused:   . Sexually Abused:     Family History  Problem Relation Age of Onset  . Asthma Father   . Arthritis Father        father, brother, Pgrandfather  . Chronic bronchitis Father   . Allergies  Father   . Hypertension Brother        obese  . CAD Paternal Grandfather        MI x2  . Multiple sclerosis Brother   . Stroke Neg Hx   . Cancer Neg Hx     Current Facility-Administered Medications  Medication Dose Route Frequency Provider Last Rate Last Admin  . acetaminophen (TYLENOL) tablet 1,000 mg  1,000 mg Oral Q6H Michael Boston, MD   1,000 mg at 06/20/20 0700  . albuterol (PROVENTIL) (2.5 MG/3ML) 0.083% nebulizer solution 2.5 mg  2.5 mg Nebulization Q4H PRN Michael Boston, MD      . alum & mag hydroxide-simeth (MAALOX/MYLANTA) 200-200-20 MG/5ML suspension 30 mL  30 mL Oral Q6H PRN Michael Boston, MD      . amLODipine (NORVASC) tablet 10 mg  10 mg Oral Daily Michael Boston, MD   10 mg at 06/19/20 1011  . chlorproMAZINE (THORAZINE) 25 mg in sodium chloride 0.9 % 25 mL IVPB  25 mg Intravenous Q6H PRN Michael Boston, MD   Stopped at 06/18/20 1520  . diphenhydrAMINE (BENADRYL) 12.5 MG/5ML elixir 12.5 mg  12.5 mg Oral Q6H PRN Michael Boston, MD   12.5 mg at 06/19/20 2033   Or  . diphenhydrAMINE (BENADRYL) injection 12.5 mg  12.5 mg Intravenous Q6H PRN Michael Boston, MD      . enoxaparin (LOVENOX) injection 40 mg  40 mg Subcutaneous Q24H Michael Boston, MD   40 mg at 06/20/20 9935  . feeding supplement (ENSURE SURGERY) liquid 237 mL  237 mL Oral BID BM Michael Boston, MD   [phone]  mL at 06/19/20 1012  . gabapentin (NEURONTIN) capsule 300 mg  300 mg Oral TID Michael Boston, MD   300 mg at 06/19/20 2031  . HYDROmorphone (DILAUDID) injection 0.5-2 mg  0.5-2 mg Intravenous Q4H PRN Michael Boston, MD   1 mg at 06/20/20 0452  . lip balm (CARMEX) ointment 1 application  1 application Topical BID Michael Boston, MD   1 application at 74/71/59 2035  . loratadine (CLARITIN) tablet 10 mg  10 mg Oral Daily Michael Boston, MD   10 mg at 06/19/20 1010  . magic mouthwash  15 mL Oral QID PRN Michael Boston, MD      . metoprolol tartrate (LOPRESSOR) injection 5 mg  5 mg Intravenous Q6H PRN Michael Boston, MD      .  metoprolol tartrate (LOPRESSOR) tablet 12.5 mg  12.5 mg Oral BID Michael Boston, MD   12.5 mg at 06/19/20 2031  . omega-3 acid ethyl esters (LOVAZA) capsule 1 g  1 g Oral Daily Michael Boston, MD   1 g at 06/19/20 1012  . ondansetron (ZOFRAN) tablet 4 mg  4 mg Oral Q6H PRN Michael Boston, MD   4 mg at 06/17/20 2122   Or  . ondansetron (ZOFRAN) injection 4 mg  4 mg Intravenous Q6H PRN Michael Boston, MD      . pantoprazole (PROTONIX) EC tablet 40 mg  40 mg Oral BID Michael Boston, MD   40 mg at 06/19/20 2032  . psyllium (HYDROCIL/METAMUCIL) packet 1 packet  1 packet Oral BID Michael Boston, MD   1 packet at 06/19/20 2032  . traMADol (ULTRAM) tablet 50-100 mg  50-100 mg Oral Q6H PRN Michael Boston, MD   100 mg at 06/20/20 5396     Allergies  Allergen Reactions  . Hctz [Hydrochlorothiazide] Rash    Per derm rec stay off HCTZ (2018)    Signed: Morton Peters, MD, FACS, MASCRS Gastrointestinal and Minimally Invasive Surgery  [facility] [address] [place], [place] [address] [phone] Fax [phone] Main/Paging  CONTACT INFORMATION: Weekday (9AM-5PM) concerns: Call [facility] main office at [phone] Weeknight (5PM-9AM) or Weekend/Holiday concerns: Check [email or web address] for General Surgery [facility] coverage (Please, do not use SecureChat as it is not reliable communication to operating surgeons for immediate patient care)      06/20/2020, 8:10 AM

## 2020-06-20 NOTE — Progress Notes (Signed)
Discharge instructions given to pt and all questions were answered.  

## 2020-08-21 ENCOUNTER — Ambulatory Visit: Payer: BC Managed Care – PPO | Admitting: Dermatology

## 2020-08-21 ENCOUNTER — Other Ambulatory Visit: Payer: Self-pay

## 2020-08-21 DIAGNOSIS — Z86018 Personal history of other benign neoplasm: Secondary | ICD-10-CM | POA: Diagnosis not present

## 2020-08-21 DIAGNOSIS — L820 Inflamed seborrheic keratosis: Secondary | ICD-10-CM | POA: Diagnosis not present

## 2020-08-21 DIAGNOSIS — L578 Other skin changes due to chronic exposure to nonionizing radiation: Secondary | ICD-10-CM

## 2020-08-21 DIAGNOSIS — L821 Other seborrheic keratosis: Secondary | ICD-10-CM

## 2020-08-21 NOTE — Patient Instructions (Signed)
Cryotherapy Aftercare  . Wash gently with soap and water everyday.   . Apply Vaseline and Band-Aid daily until healed.  

## 2020-08-21 NOTE — Progress Notes (Signed)
   Follow-Up Visit   Subjective  Jordan Buchanan is a 49 y.o. male who presents for the following: Seborrheic Keratosis (3 months f/u [facility] arms, neck, shoulders treated with LN2 ).  The following portions of the chart were reviewed this encounter and updated as appropriate:  Tobacco  Allergies  Meds  Problems  Med Hx  Surg Hx  Fam Hx     Review of Systems:  No other skin or systemic complaints except as noted in HPI or Assessment and Plan.  Objective  Well appearing patient in no apparent distress; mood and affect are within normal limits.  A focused examination was performed including face, shoulders, arms . Relevant physical exam findings are noted in the Assessment and Plan.  Objective  L top of shouder x 1, L post axillary fold x 5, L upper eyelid x 4, R upper eyelid x 2 (12): Erythematous keratotic or waxy stuck-on papule or plaque.   Objective  L lateral base of neck: Scar with no evidence of recurrence.    Assessment & Plan  Inflamed seborrheic keratosis (12) L top of shouder x 1, L post axillary fold x 5, L upper eyelid x 4, R upper eyelid x 2  Destruction of lesion - L top of shouder x 1, L post axillary fold x 5, L upper eyelid x 4, R upper eyelid x 2 Complexity: simple   Destruction method: cryotherapy   Informed consent: discussed and consent obtained   Timeout:  patient name, date of birth, surgical site, and procedure verified Lesion destroyed using liquid nitrogen: Yes   Region frozen until ice ball extended beyond lesion: Yes   Outcome: patient tolerated procedure well with no complications   Post-procedure details: wound care instructions given    History of dysplastic nevus L lateral base of neck  Clear. Observe for recurrence. Call clinic for new or changing lesions.  Recommend regular skin exams, daily broad-spectrum spf 30+ sunscreen use, and photoprotection.     Seborrheic Keratoses - Stuck-on, waxy, tan-brown papules and plaques  - Discussed  benign etiology and prognosis. - Observe - Call for any changes  Actinic Damage - diffuse scaly erythematous macules with underlying dyspigmentation - Recommend daily broad spectrum sunscreen SPF 30+ to sun-exposed areas, reapply every 2 hours as needed.  - Call for new or changing lesions.  Return in about 1 year (around 08/21/2021) for TBSE, Hx of Dysplastic nevus .  IMarye Round, CMA, am acting as scribe for Sarina Ser, MD .  Documentation: I have reviewed the above documentation for accuracy and completeness, and I agree with the above.  Sarina Ser, MD

## 2020-08-27 ENCOUNTER — Encounter: Payer: Self-pay | Admitting: Dermatology

## 2020-11-19 DIAGNOSIS — U071 COVID-19: Secondary | ICD-10-CM

## 2020-11-19 HISTORY — DX: COVID-19: U07.1

## 2020-12-02 IMAGING — CT CT ABD-PELV W/ CM
2 of 5 series · 16 of 46 positions shown, 18 images · IV contrast (Omnipaque)
Comparison: 04/13/2020

CLINICAL DATA: Abdominal pain, diverticulitis

EXAM:
CT ABDOMEN AND PELVIS WITH CONTRAST
TECHNIQUE: Multidetector CT imaging of the abdomen and pelvis was performed
using the standard protocol following bolus administration of
intravenous contrast.
CONTRAST:  100mL OMNIPAQUE IOHEXOL 300 MG/ML  SOLN

[Series 2: axial st · axial · 0.87mm/px · z∈[-502,-42]mm · 13 of 104 slices shown, 15 images]
[im 6/104  soft-tissue]
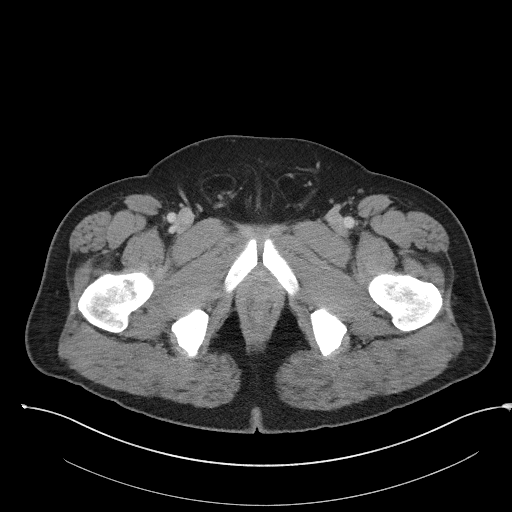
[im 6/104  bone]
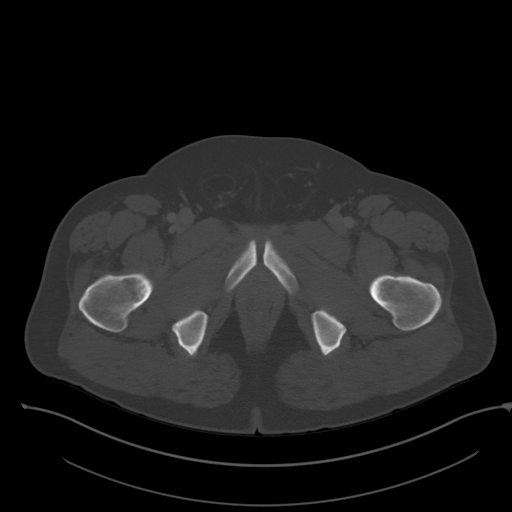
[im 17/104  soft-tissue]
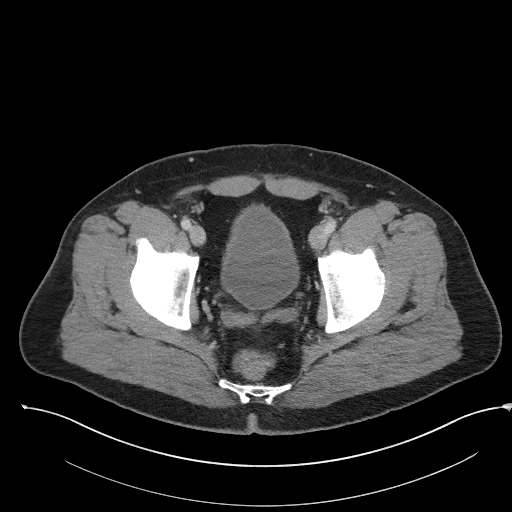
[im 22/104  soft-tissue]
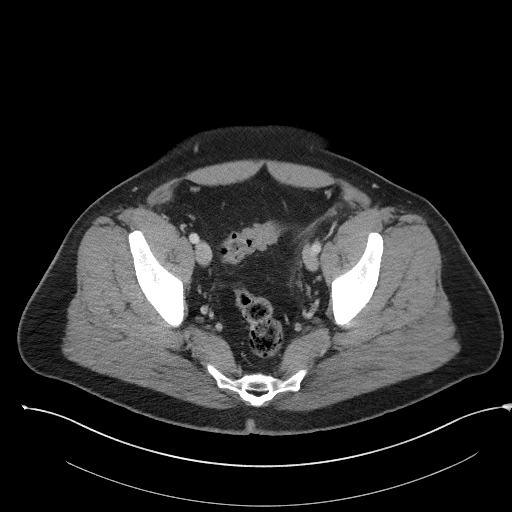
[im 28/104  soft-tissue]
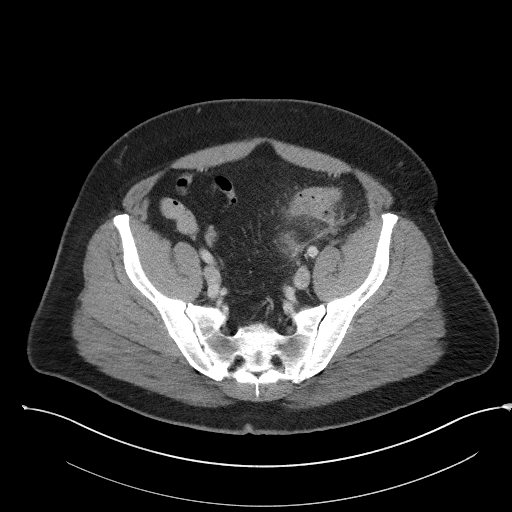
[im 38/104  soft-tissue]
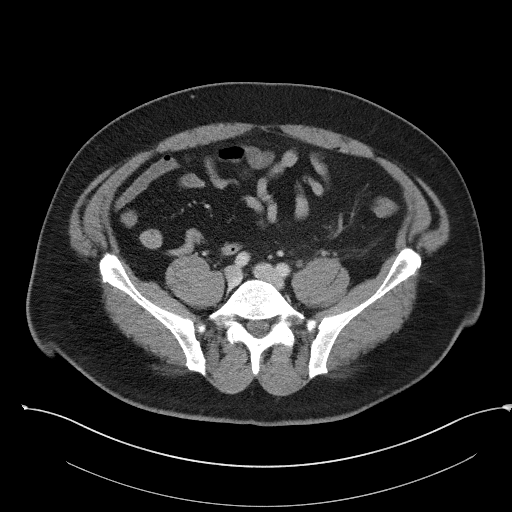
[im 44/104  soft-tissue]
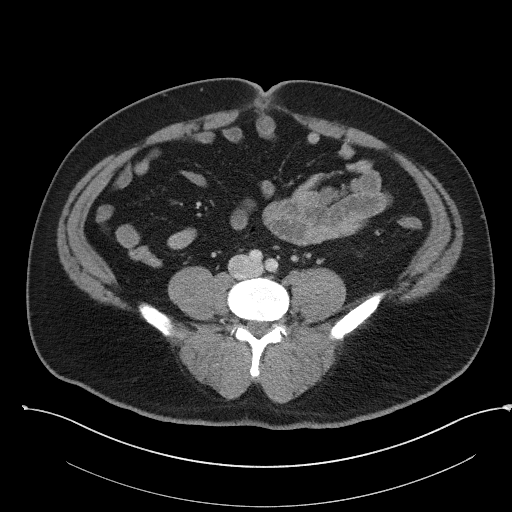
[im 55/104  soft-tissue]
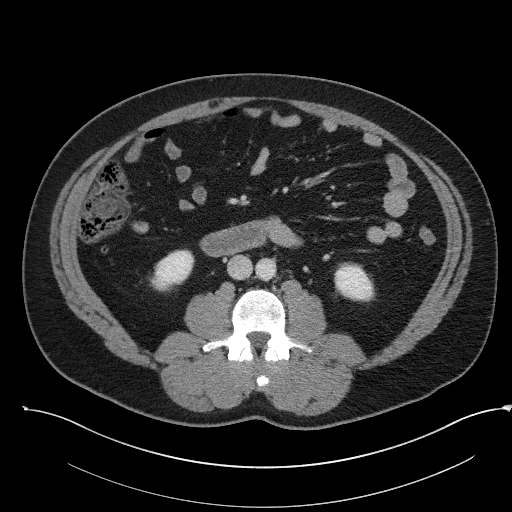
[im 60/104  soft-tissue]
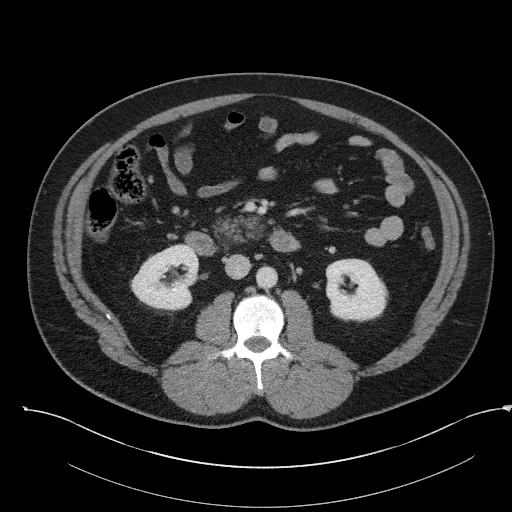
[im 66/104  soft-tissue]
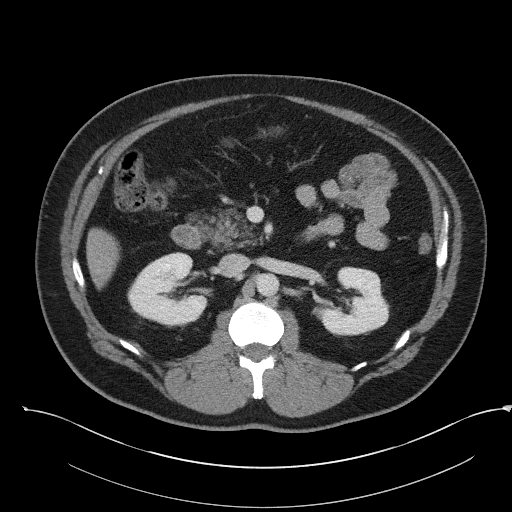
[im 66/104  bone]
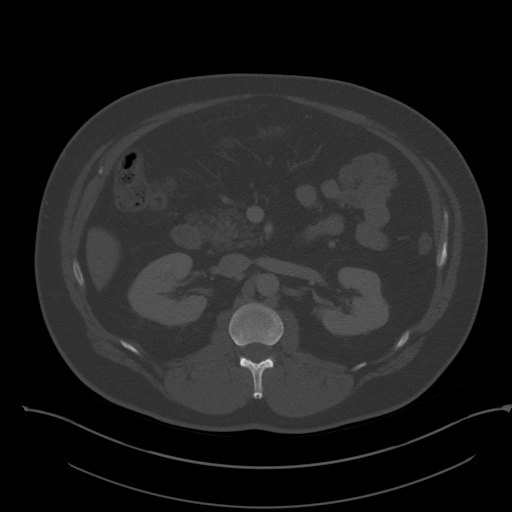
[im 76/104  soft-tissue]
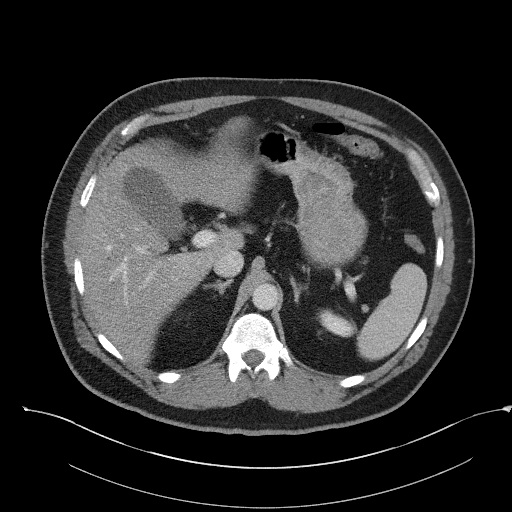
[im 82/104  soft-tissue]
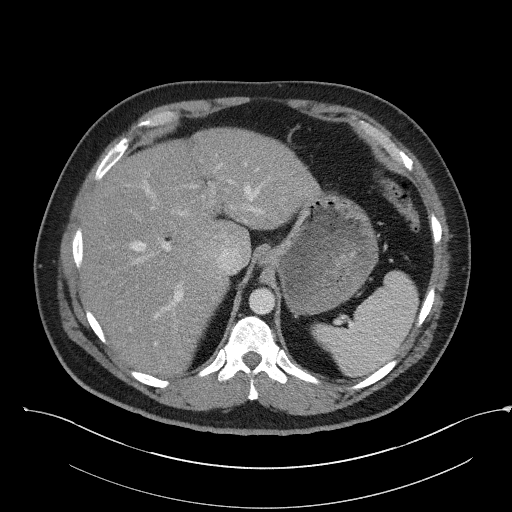
[im 87/104  soft-tissue]
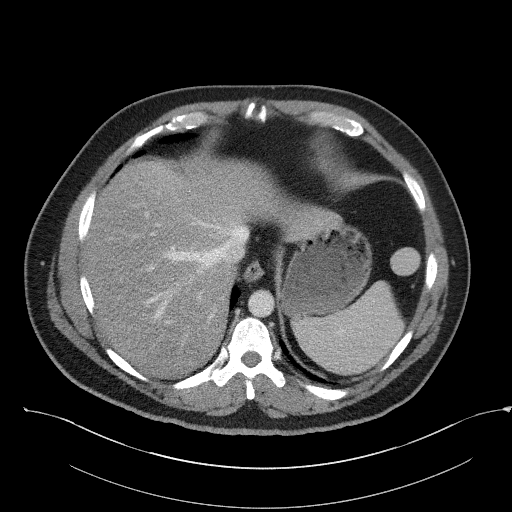
[im 98/104  soft-tissue]
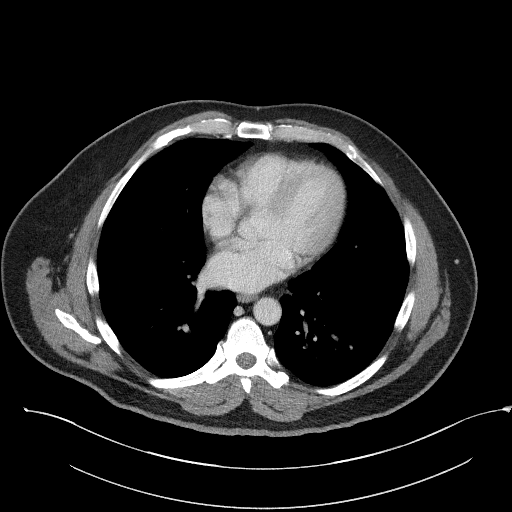

[Series 5: coronal st · coronal · 0.88mm/px · 3 of 96 slices shown]
[im 32/96  soft-tissue]
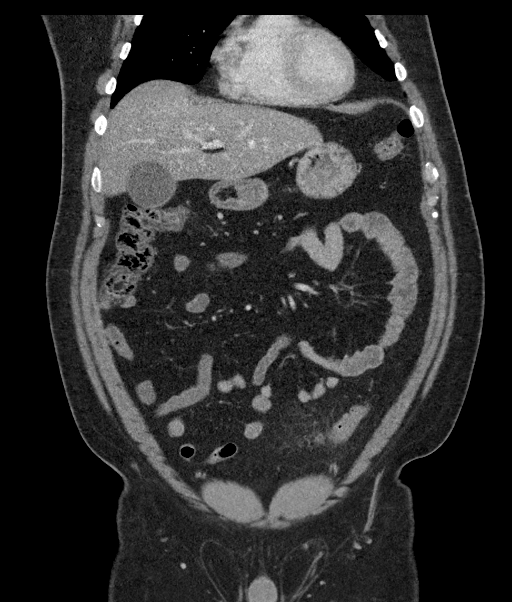
[im 43/96  soft-tissue]
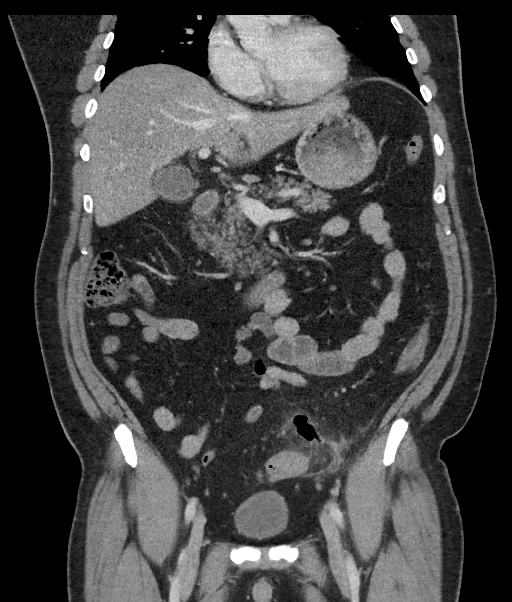
[im 53/96  soft-tissue]
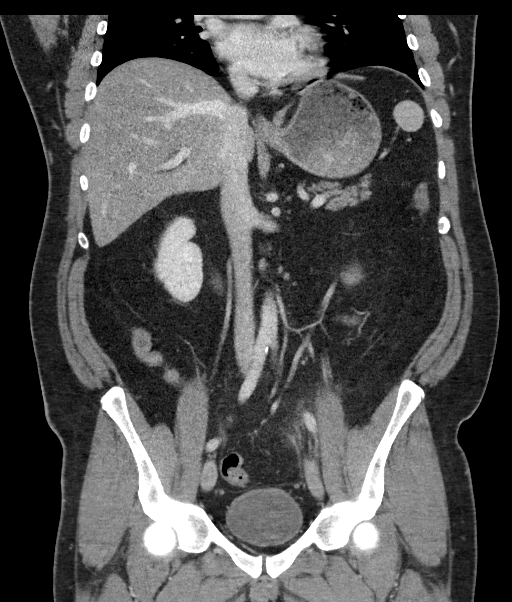

[16 of 46 positions shown; findings below may reference images not displayed]

FINDINGS: Lower chest: No acute pleural or parenchymal lung disease

Hepatobiliary: Diffuse hepatic steatosis. No focal abnormality. The
gallbladder is unremarkable.

Pancreas: Unremarkable. No pancreatic ductal dilatation or
surrounding inflammatory changes.

Spleen: Normal in size without focal abnormality.

Adrenals/Urinary Tract: Adrenal glands are unremarkable. Kidneys are
normal, without renal calculi, focal lesion, or hydronephrosis.
Bladder is unremarkable.

Stomach/Bowel: Wall thickening and pericolonic fat stranding within
the sigmoid colon again noted unchanged. There is a small gas/fluid
collection in the left lower quadrant measuring 3.0 x 2.7 cm
consistent with focal contained perforation. No evidence of
peripheral enhancement or abscess at this time.

No bowel obstruction or ileus. Normal appendix right lower quadrant.

Vascular/Lymphatic: Aortic atherosclerosis. No enlarged abdominal or
pelvic lymph nodes.

Reproductive: Prostate is unremarkable.

Other: No abdominal wall hernia or abnormality. No abdominopelvic
ascites.

Musculoskeletal: No acute or destructive bony lesions. Reconstructed
images demonstrate no additional findings.
IMPRESSION: 1. Continued findings of acute sigmoid diverticulitis. There is
continued focal contained perforation, with small gas/fluid
collection as above. No evidence of well-formed abscess at this
time.
2. Hepatic steatosis.
3.  Aortic Atherosclerosis ([id number]).

## 2020-12-03 IMAGING — RF DG CHOLANGIOGRAM OPERATIVE
1 series · 4 of 4 positions shown · non-contrast
Comparison: None.

CLINICAL DATA: 48-year-old male undergoing laparoscopic
cholecystectomy.

EXAM:
INTRAOPERATIVE CHOLANGIOGRAM
TECHNIQUE: Cholangiographic images from the C-arm fluoroscopic device were
submitted for interpretation post-operatively. Please see the
procedural report for the amount of contrast and the fluoroscopy
time utilized.

[Series 1: run · 4 of 49 frames shown]
[frame 7/49]
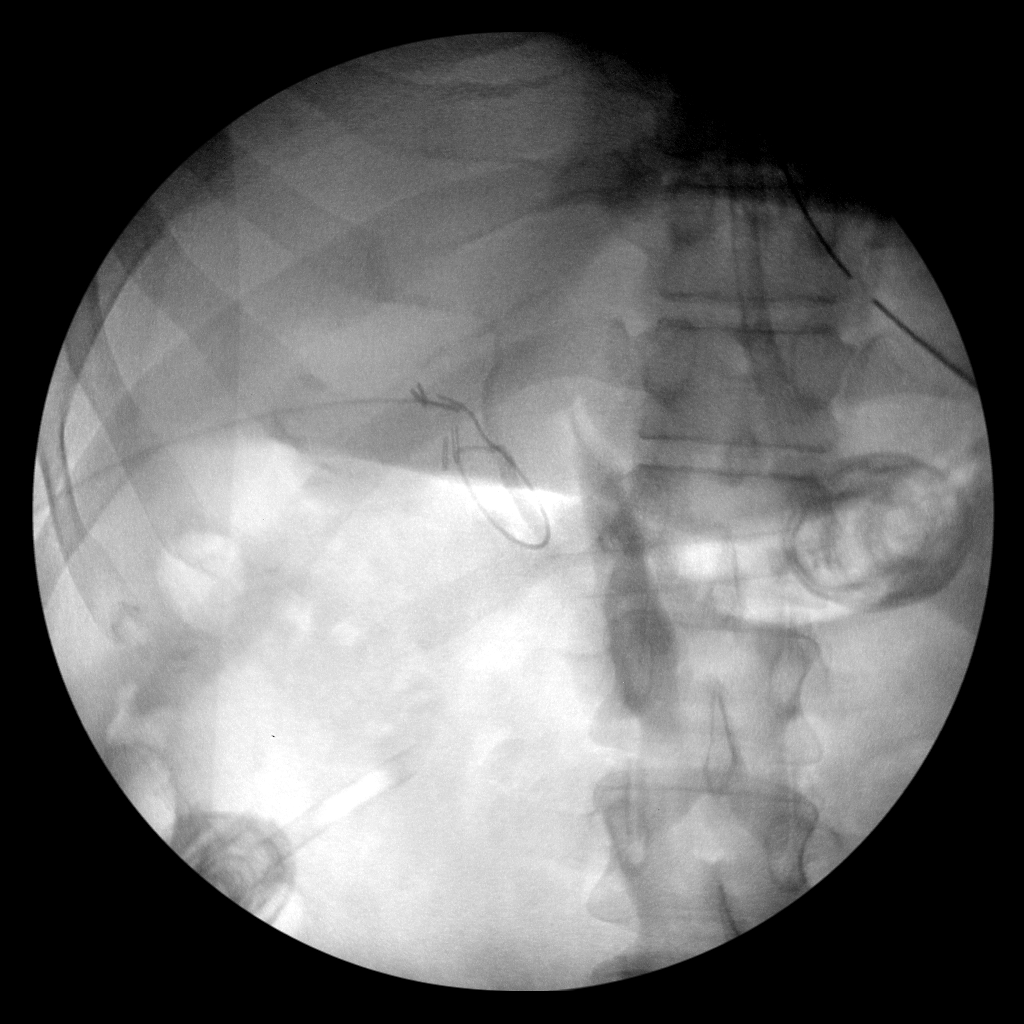
[frame 8/49]
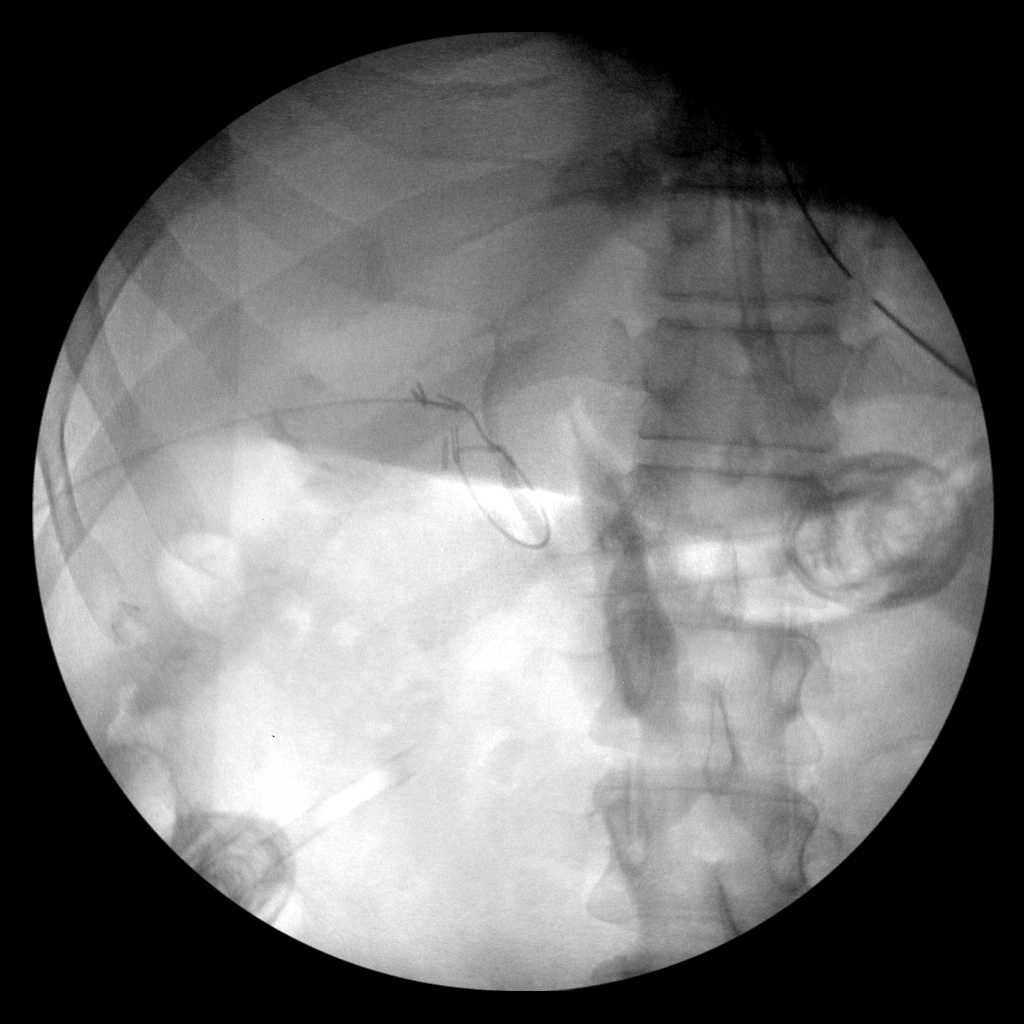
[frame 25/49]
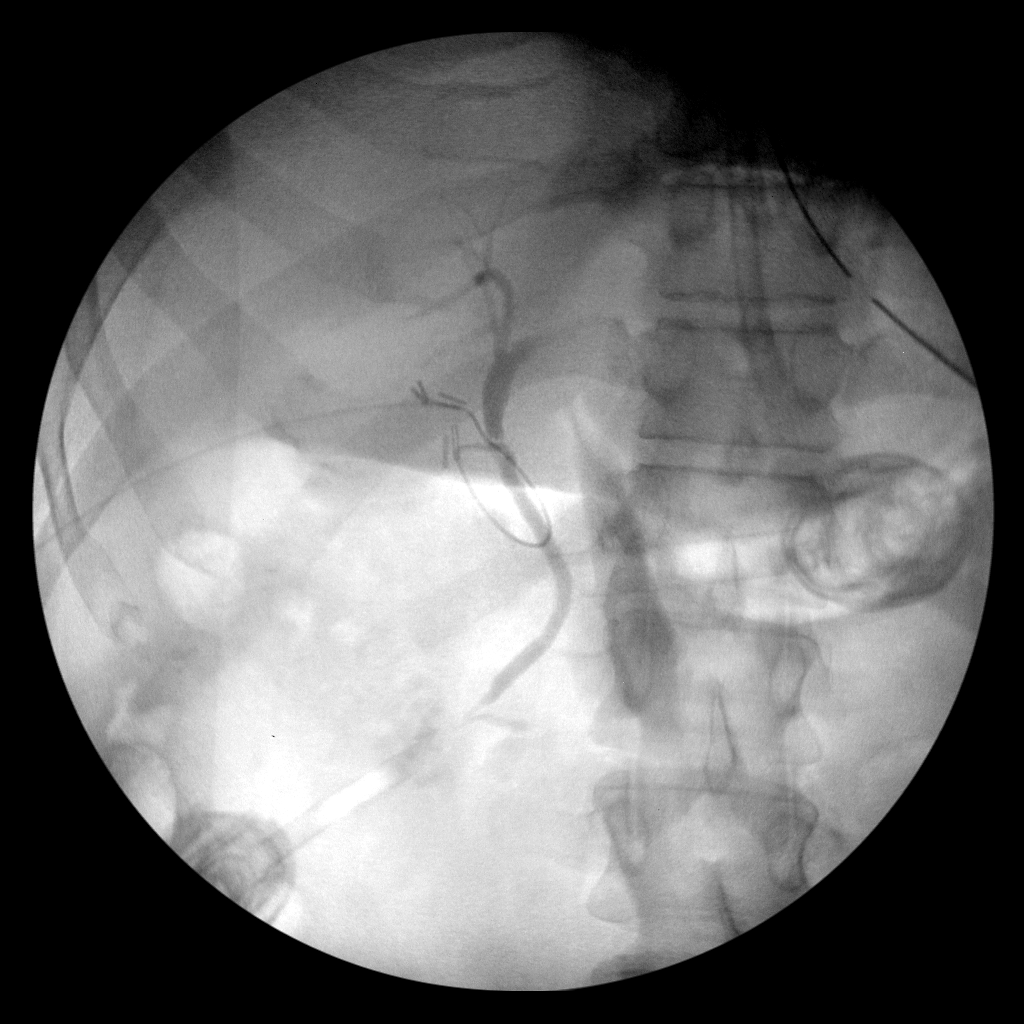
[frame 42/49]
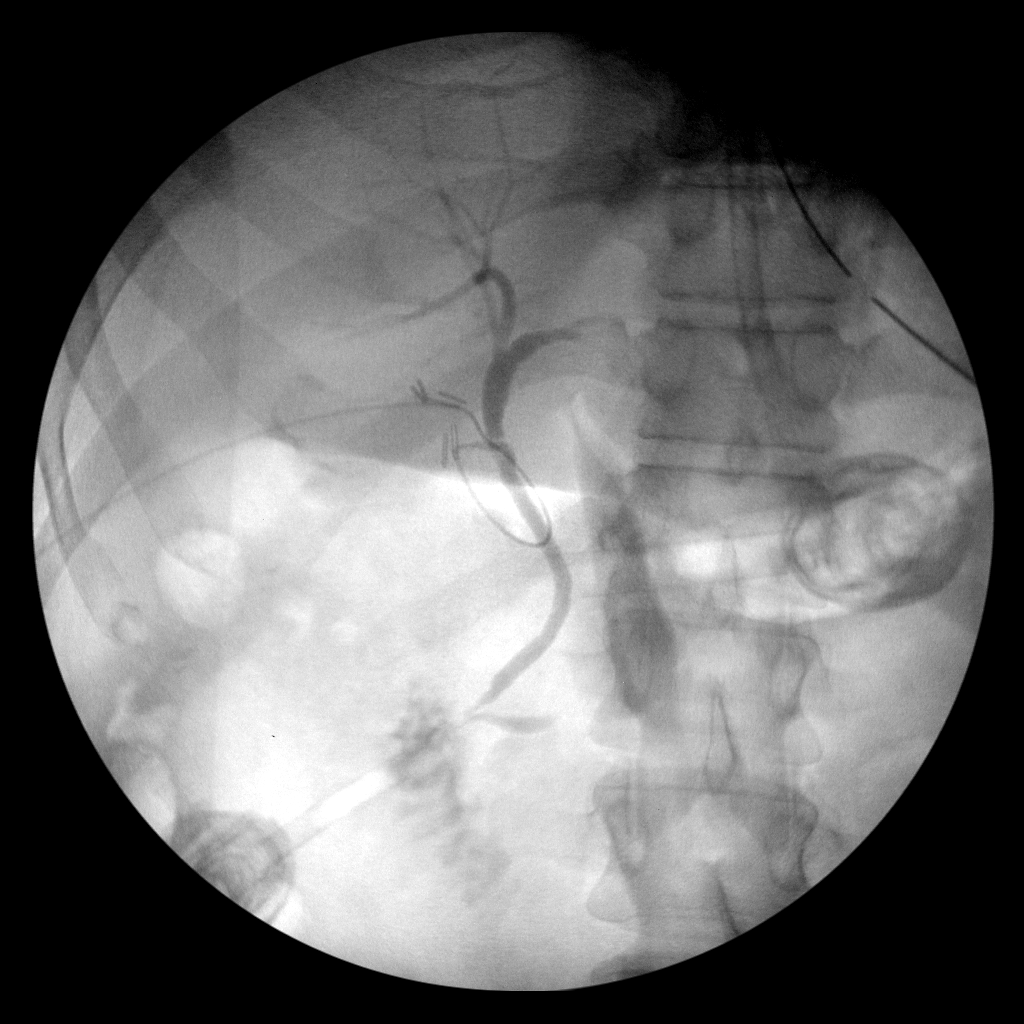

[4 of 4 positions shown; findings below may reference images not displayed]

FINDINGS: A single cine clip demonstrates cannulation of the cystic duct
remanent and intraoperative cholangiogram. There is no evidence of
biliary ductal dilatation, stenosis, stricture or
choledocholithiasis. Contrast material refluxes into the distal
aspect of the main pancreatic duct. Contrast passes freely through
the ampulla and into the duodenum.
IMPRESSION: Negative intraoperative cholangiogram.

## 2020-12-24 ENCOUNTER — Other Ambulatory Visit: Payer: Self-pay

## 2020-12-24 ENCOUNTER — Ambulatory Visit (INDEPENDENT_AMBULATORY_CARE_PROVIDER_SITE_OTHER): Payer: BC Managed Care – PPO

## 2020-12-24 ENCOUNTER — Encounter: Payer: Self-pay | Admitting: *Deleted

## 2020-12-24 ENCOUNTER — Ambulatory Visit
Admission: EM | Admit: 2020-12-24 | Discharge: 2020-12-24 | Disposition: A | Discharge status: Home / Self Care | Payer: BC Managed Care – PPO | Attending: Emergency Medicine | Admitting: Emergency Medicine

## 2020-12-24 ENCOUNTER — Telehealth: Payer: Self-pay | Admitting: *Deleted

## 2020-12-24 DIAGNOSIS — J9811 Atelectasis: Secondary | ICD-10-CM | POA: Diagnosis not present

## 2020-12-24 DIAGNOSIS — U071 COVID-19: Secondary | ICD-10-CM | POA: Diagnosis not present

## 2020-12-24 DIAGNOSIS — R059 Cough, unspecified: Secondary | ICD-10-CM | POA: Diagnosis not present

## 2020-12-24 DIAGNOSIS — R079 Chest pain, unspecified: Secondary | ICD-10-CM

## 2020-12-24 MED ORDER — BENZONATATE 100 MG PO CAPS
200.0000 mg | ORAL_CAPSULE | Freq: Three times a day (TID) | ORAL | 0 refills | Status: DC | PRN
Start: 2020-12-24 — End: 2021-05-04

## 2020-12-24 MED ORDER — ALBUTEROL SULFATE HFA 108 (90 BASE) MCG/ACT IN AERS: 2.0000 | INHALATION_SPRAY | RESPIRATORY_TRACT | 5 refills | Status: DC | PRN

## 2020-12-24 MED ORDER — AEROCHAMBER MV MISC
2 refills | Status: DC
Start: 2020-12-24 — End: 2021-05-04

## 2020-12-24 MED ORDER — BENZONATATE 100 MG PO CAPS
200.0000 mg | ORAL_CAPSULE | Freq: Three times a day (TID) | ORAL | 0 refills | Status: DC
Start: 2020-12-24 — End: 2020-12-24

## 2020-12-24 NOTE — ED Triage Notes (Signed)
Patient states that he was positive for covid on 12/27. Reports that he is supposed to go back to work tomorrow but has noticed an increase in chest pain today and worsens when taking a deep breath.

## 2020-12-24 NOTE — Telephone Encounter (Signed)
Thank you. Agree with in person eval.

## 2020-12-24 NOTE — Discharge Instructions (Addendum)
The albuterol inhaler, 2 puffs every 4-6 hours with the spacer for shortness of breath and wheezing.  Use the Tessalon Perles every 8 hours as needed for cough.  If you develop worsening shortness of breath, especially at rest, you are unable to speak in full sentences, or you develop a bluish hue to your lips you need to go to the ER for evaluation.

## 2020-12-24 NOTE — Telephone Encounter (Signed)
Patient called stating that he was tested for covid on 12/14/20 and got the results the next day and they were positive. Patient stated that he started out with a headache, sore throat and feeling bad. Patient stated Thursday he started feeling better and had plained on going back to work tomorrow. Patient stated that he started feeling bad again today. Patient stated that his chest is starting to hurt and he has tightness in his chest. Patient stated that he is getting winded easily and is wheezing when he coughs. Patient was advised with his symptoms he should have a face to face evaluation so that someone can listen to his lungs Patient was given information on the [facility] in [place]. Patient stated that he will get dressed and head to the UC now.

## 2020-12-24 NOTE — ED Provider Notes (Signed)
[facility]    CSN: [id number] Arrival date & time: 12/24/20  1740      History   Chief Complaint Chief Complaint  Patient presents with  . Covid Positive    [phone]    HPI Jordan Buchanan is a 50 y.o. male.   HPI   Evan-year-old male here for evaluation of right-sided chest pain that worsens with deep breathing that started today.  Patient is on day 10 of his Covid quarantine.  He was diagnosed Covid positive on 12/15/2020.  Patient reports that he had been feeling fine and will try to go back to work tomorrow.  Patient reports that he even took Tylenol this Christmas decorations yesterday put them away and then when he woke up this morning he had chest pain that was in the middle and on the right side that went through to his back, some wheezing, and a dry cough.  Patient denies fever.  Patient states that his predominant Covid symptoms were a sore throat and not related respiratory in nature.  Past Medical History:  Diagnosis Date  . Arrhythmia    PALPITATIONS AND PVC - improved with cutting down on caffeine  . Cough variant asthma 05/28/2014   05/27/2014 p extensive coaching HFA effectiveness =    75% > try dulera 100 2bid  - PFT's 07/03/2014 FEV1 4.15 (98%) ratio 89 and no chang p B2 and nl fef25-75  And nl dlco    . COVID-19 virus infection 11/2020  . Diverticulosis 07/2015   by CT scan  . Dysplastic nevus 05/15/2020   Left lat. base of neck. Moderate atypia, lateral margin involved.   Marland Kitchen GERD (gastroesophageal reflux disease)   . Hepatic steatosis   . Hypertension    h/o LVH, resolved  . Migraines   . Perforated diverticulum of large intestine 03/2020   hospitalization  . Pneumonia   . Prostatitis 05/09/2013  . Seasonal allergies   . 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 Estanislado Pandy)  . Tremor   . Urticaria     Patient Active Problem List   Diagnosis Date Noted  . Diverticulitis left colon s/p robotic rectosigmoid  resection LAR 06/17/2020 06/17/2020  . Oral thrush 05/13/2020  . Acute calculous cholecystitis s/p lap cholecystectomy 04/30/2020 04/30/2020  . Acute diverticulitis 04/29/2020  . Diverticulitis of large intestine with perforation and abscess s/p lap drainage & washout 04/30/2020 04/13/2020  . Incontinence of feces 08/27/2019  . Health maintenance examination 09/08/2018  . Chest pain 07/04/2018  . Urticaria 04/24/2018  . Skin rash 04/24/2018  . Family history of multiple sclerosis 04/27/2017  . Former smoker 04/27/2017  . Spondyloarthropathy   . OSA (obstructive sleep apnea) 08/19/2016  . Low back pain radiating to left lower extremity 08/06/2016  . Obesity, [name], BMI 30.0-34.9 (see actual BMI) 04/21/2016  . Cough, persistent 03/10/2016  . LLQ abdominal pain 07/31/2015  . Cough variant asthma  05/28/2014  . Pruritic condition 04/18/2014  . Hepatic steatosis   . Migraines   . Perennial allergic rhinitis   . GERD (gastroesophageal reflux disease)   . Hypertension     Past Surgical History:  Procedure Laterality Date  . CHOLECYSTECTOMY N/A 04/30/2020   Procedure: LAPAROSCOPIC CHOLECYSTECTOMY WITH INTRAOPERATIVE CHOLANGIOGRAM, BILATERAL TAP BLOCK;  Surgeon: Michael Boston, MD;  Location: [facility];  Service: General;  Laterality: N/A;  . ESOPHAGOGASTRODUODENOSCOPY  08/2018   esophagus dilated, biopsy showed chronic gastritis with mod reflux changes [facility])  . LAPAROSCOPY  N/A 04/30/2020   Procedure: LAPAROSCOPY DIAGNOSTIC WITH WASHOUT AND DRAINAGE PLACEMENT, DRAINAGE OF DIVERTICULITIS ABSCESS;  Surgeon: Michael Boston, MD;  Location: [facility];  Service: General;  Laterality: N/A;  . NASAL RECONSTRUCTION WITH SEPTAL REPAIR  2017   [place]  . OTHER SURGICAL HISTORY  1992   facial surgery  . PROCTOSCOPY N/A 06/17/2020   Procedure: RIGID PROCTOSCOPY;  Surgeon: Michael Boston, MD;  Location: [facility];  Service: General;  Laterality: N/A;  . ROBOTIC LOW ANTERIOR RECTOSIGMOID RESECTION  06/17/2020    for recurrent L sided diverticulitis ([place])  . ROTATOR CUFF REPAIR  2009   left  . SINOSCOPY    . UMBILICAL HERNIA REPAIR  05/10/2012   Procedure: HERNIA REPAIR UMBILICAL ADULT;  Surgeon: Harl Bowie, MD;  Location: [facility];  Service: General;  Laterality: N/A;       Home Medications    Prior to Admission medications   Medication Sig Start Date End Date Taking? Authorizing Provider  amLODipine (NORVASC) 10 MG tablet Take 1 tablet (10 mg total) by mouth daily. 03/14/20  Yes Ria Bush, MD  cetirizine (ZYRTEC) 10 MG tablet Take 10 mg by mouth daily.   Yes [provider]  metoprolol succinate (TOPROL-XL) 50 MG 24 hr tablet Take 1 tablet (50 mg total) by mouth daily. Take with or immediately following a meal. 03/14/20  Yes Ria Bush, MD  omega-3 acid ethyl esters (LOVAZA) 1 g capsule Take 1 g by mouth daily.   Yes [provider]  pantoprazole (PROTONIX) 40 MG tablet Take 40 mg by mouth 2 (two) times daily.  09/06/19 12/24/20 Yes [provider]  Spacer/Aero-Holding Chambers (AEROCHAMBER MV) inhaler Use as instructed 12/24/20  Yes Margarette Canada, NP  albuterol (VENTOLIN HFA) 108 (90 Base) MCG/ACT inhaler Inhale 2 puffs into the lungs every 4 (four) hours as needed for wheezing or shortness of breath. 12/24/20   Margarette Canada, NP  benzonatate (TESSALON) 100 MG capsule Take 2 capsules (200 mg total) by mouth 3 (three) times daily as needed for cough. 12/24/20   Margarette Canada, NP    Family History Family History  Problem Relation Age of Onset  . Asthma Father   . Arthritis Father        father, brother, Pgrandfather  . Chronic bronchitis Father   . Allergies Father   . Hypertension Brother        obese  . CAD Paternal Grandfather        MI x2  . Multiple sclerosis Brother   . Stroke Neg Hx   . Cancer Neg Hx     Social History Social History   Tobacco Use  . Smoking status: Former Smoker    Packs/day: 1.00    Years: 13.00     Pack years: 13.00    Types: Cigarettes    Quit date: 12/21/1999    Years since quitting: 21.0  . Smokeless tobacco: Former Systems developer    Types: Snuff    Quit date: 12/20/2013  Vaping Use  . Vaping Use: Never used  Substance Use Topics  . Alcohol use: No    Alcohol/week: 0.0 standard drinks    Comment: quit 2002  . Drug use: Not Currently    Types: Marijuana    Comment: quit 2002     Allergies   Hctz [hydrochlorothiazide]   Review of Systems Review of Systems  Constitutional: Negative for activity change, appetite change, fatigue and fever.  Respiratory: Positive for cough, shortness of breath and  wheezing.   Cardiovascular: Positive for chest pain.  Musculoskeletal: Negative for arthralgias.  Skin: Negative for rash.  Hematological: Negative.   Psychiatric/Behavioral: Negative.      Physical Exam Triage Vital Signs ED Triage Vitals  Enc Vitals Group     BP 12/24/20 2022 (!) 150/95     Pulse Rate 12/24/20 2022 74     Resp 12/24/20 2022 18     Temp 12/24/20 2022 98.1 F (36.7 C)     Temp Source 12/24/20 2022 Oral     SpO2 12/24/20 2022 98 %     Weight 12/24/20 1913 235 lb (106.6 kg)     Height 12/24/20 1913 5' 10"  (1.778 m)     Head Circumference --      Peak Flow --      Pain Score 12/24/20 1912 7     Pain Loc --      Pain Edu? --      Excl. in [place]? --    No data found.  Updated Vital Signs BP (!) 150/95 (BP Location: Left Arm)   Pulse 74   Temp 98.1 F (36.7 C) (Oral)   Resp 18   Ht 5' 10"  (1.778 m)   Wt 235 lb (106.6 kg)   SpO2 98%   BMI 33.72 kg/m   Visual Acuity Right Eye Distance:   Left Eye Distance:   Bilateral Distance:    Right Eye Near:   Left Eye Near:    Bilateral Near:     Physical Exam Vitals and nursing note reviewed.  Constitutional:      General: He is not in acute distress.    Appearance: Normal appearance. He is normal weight. He is not toxic-appearing.  HENT:     Head: Normocephalic and atraumatic.  Cardiovascular:      Rate and Rhythm: Normal rate and regular rhythm.     Pulses: Normal pulses.     Heart sounds: Normal heart sounds. No murmur heard. No gallop.   Pulmonary:     Effort: Pulmonary effort is normal.     Breath sounds: Normal breath sounds. No wheezing, rhonchi or rales.  Skin:    General: Skin is warm and dry.     Capillary Refill: Capillary refill takes less than 2 seconds.     Findings: No erythema.  Neurological:     General: No focal deficit present.     Mental Status: He is alert and oriented to person, place, and time.  Psychiatric:        Mood and Affect: Mood normal.        Behavior: Behavior normal.        Thought Content: Thought content normal.        Judgment: Judgment normal.      UC Treatments / Results  Labs (all labs ordered are listed, but only abnormal results are displayed) Labs Reviewed - No data to display  EKG   Radiology DG Chest 2 View  Result Date: 12/24/2020 CLINICAL DATA:  Cough and chest pain.  COVID-19 positive EXAM: CHEST - 2 VIEW COMPARISON:  June 21, 2019 FINDINGS: There is no edema or airspace opacity. There is minimal left base atelectasis. Heart size and pulmonary vascularity are normal. No adenopathy. No bone lesions. IMPRESSION: Minimal left base atelectasis. Lungs otherwise clear. Heart size normal. Electronically Signed   By: Lowella Grip III M.D.   On: 12/24/2020 20:38    Procedures Procedures (including critical care time)  Medications Ordered in UC Medications -  No data to display  Initial Impression / Assessment and Plan / UC Course  I have reviewed the triage vital signs and the nursing notes.  Pertinent labs & imaging results that were available during my care of the patient were reviewed by me and considered in my medical decision making (see chart for details).   Presents for evaluation of cough, shortness of breath, wheezing, and right-sided chest pain that started 2-day.  Patient is on day 10 of his Covid quarantine.   This is a new change to his symptomology.  Patient indicates that his predominant symptom from Covid was a sore throat.  Patient is in no acute distress and can speak in full sentences.  No tachypnea.  Patient's lungs are clear to auscultation in all fields.  Will obtain chest x-ray.  Chest x-ray shows left base atelectasis but otherwise is normal.  We will discharge patient home with Tessalon Perles and an inhaler for his cough and shortness of breath.  Discussed with patient that on occasion patients have experienced respiratory symptoms after they have recovered from Covid.  Patient advised to keep an eye on his symptoms and go to the ER if he develops worsening shortness of breath, especially at rest, he cannot speak in full sentences, or he develops bluing of his lips.  Patient advised that cough and shortness of breath can last up to 6 weeks post Covid infection and if his symptoms last beyond that to discuss with his primary care provider about being referred to the [facility] COVID-19 long-haul clinic.   Final Clinical Impressions(s) / UC Diagnoses   Final diagnoses:  [id number]     Discharge Instructions     The albuterol inhaler, 2 puffs every 4-6 hours with the spacer for shortness of breath and wheezing.  Use the Tessalon Perles every 8 hours as needed for cough.  If you develop worsening shortness of breath, especially at rest, you are unable to speak in full sentences, or you develop a bluish hue to your lips you need to go to the ER for evaluation.      ED Prescriptions    Medication Sig Dispense Auth. Provider   albuterol (VENTOLIN HFA) 108 (90 Base) MCG/ACT inhaler  (Status: Discontinued) Inhale 2 puffs into the lungs every 4 (four) hours as needed for wheezing or shortness of breath. 1 each Margarette Canada, NP   benzonatate (TESSALON) 100 MG capsule  (Status: Discontinued) Take 2 capsules (200 mg total) by mouth every 8 (eight) hours. 21 capsule Margarette Canada, NP   albuterol  (VENTOLIN HFA) 108 (90 Base) MCG/ACT inhaler Inhale 2 puffs into the lungs every 4 (four) hours as needed for wheezing or shortness of breath. 1 each Margarette Canada, NP   benzonatate (TESSALON) 100 MG capsule Take 2 capsules (200 mg total) by mouth 3 (three) times daily as needed for cough. 21 capsule Margarette Canada, NP   Spacer/Aero-Holding Josiah Lobo (AEROCHAMBER MV) inhaler Use as instructed 1 each Margarette Canada, NP     PDMP not reviewed this encounter.   Margarette Canada, NP 12/24/20 2104

## 2021-01-12 DIAGNOSIS — M5459 Other low back pain: Secondary | ICD-10-CM | POA: Diagnosis not present

## 2021-01-28 ENCOUNTER — Other Ambulatory Visit: Payer: Self-pay

## 2021-01-28 ENCOUNTER — Ambulatory Visit: Payer: PRIVATE HEALTH INSURANCE | Admitting: Podiatry

## 2021-01-28 DIAGNOSIS — M722 Plantar fascial fibromatosis: Secondary | ICD-10-CM | POA: Diagnosis not present

## 2021-01-28 DIAGNOSIS — L600 Ingrowing nail: Secondary | ICD-10-CM

## 2021-02-16 NOTE — Progress Notes (Signed)
   Subjective: 50 y.o. male presenting to the office today for a nail problem to the right great toe.  Patient states that over the past week he had some discomfort on the side of the nail that was radiating to the toe.  By Sunday there is no pain.  He states that he is doing much better however he would like to have it evaluated.  Patient also has a longstanding history of chronic plantar fasciitis to the left foot.  He is dealt with the heel pain intermittently for several years now.  He currently wears custom molded orthotics to help alleviate his pain.   Past Medical History:  Diagnosis Date  . Arrhythmia    PALPITATIONS AND PVC - improved with cutting down on caffeine  . Cough variant asthma 05/28/2014   05/27/2014 p extensive coaching HFA effectiveness =    75% > try dulera 100 2bid  - PFT's 07/03/2014 FEV1 4.15 (98%) ratio 89 and no chang p B2 and nl fef25-75  And nl dlco    . COVID-19 virus infection 11/2020  . Diverticulosis 07/2015   by CT scan  . Dysplastic nevus 05/15/2020   Left lat. base of neck. Moderate atypia, lateral margin involved.   Marland Kitchen GERD (gastroesophageal reflux disease)   . Hepatic steatosis   . Hypertension    h/o LVH, resolved  . Migraines   . Perforated diverticulum of large intestine 03/2020   hospitalization  . Pneumonia   . Prostatitis 05/09/2013  . Seasonal allergies   . 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 Estanislado Pandy)  . Tremor   . Urticaria      Objective: Physical Exam General: The patient is alert and oriented x3 in no acute distress.  Dermatology: Skin is warm, dry and supple bilateral lower extremities. Negative for open lesions or macerations bilateral.  No clinical evidence of paronychia or ingrowing nail  Vascular: Dorsalis Pedis and Posterior Tibial pulses palpable bilateral.  Capillary fill time is immediate to all digits.  Neurological: Epicritic and protective threshold intact bilateral.    Musculoskeletal: There continues to be tenderness to palpation to the plantar aspect of the left heel along the plantar fascia. All other joints range of motion within normal limits bilateral. Strength 5/5 in all groups bilateral.   Assessment: 1. Plantar fasciitis left foot; chronic, intermittent 2.  Ingrown toenail right medial border; resolved  Plan of Care:  1. Patient evaluated. Xrays reviewed.   2.  No clinical evidence of an ingrowing nail or paronychia 3.  Continue custom molded orthotics 4.  Return to clinic as needed   Edrick Kins, DPM [facility]  Dr. Edrick Kins, DPM    [address], [place] [address]                Office [phone]  Fax [phone]

## 2021-03-21 ENCOUNTER — Other Ambulatory Visit: Payer: Self-pay | Admitting: Family Medicine

## 2021-03-23 NOTE — Telephone Encounter (Signed)
E-scribed refill.  Plz schedule lab and cpe visits.  

## 2021-04-26 ENCOUNTER — Other Ambulatory Visit: Payer: Self-pay | Admitting: Family Medicine

## 2021-04-26 DIAGNOSIS — I10 Essential (primary) hypertension: Secondary | ICD-10-CM

## 2021-04-26 DIAGNOSIS — Z1159 Encounter for screening for other viral diseases: Secondary | ICD-10-CM

## 2021-04-27 ENCOUNTER — Other Ambulatory Visit: Payer: BC Managed Care – PPO

## 2021-04-29 ENCOUNTER — Other Ambulatory Visit: Payer: Self-pay

## 2021-04-29 ENCOUNTER — Other Ambulatory Visit (INDEPENDENT_AMBULATORY_CARE_PROVIDER_SITE_OTHER): Payer: BC Managed Care – PPO

## 2021-04-29 DIAGNOSIS — I10 Essential (primary) hypertension: Secondary | ICD-10-CM

## 2021-04-29 DIAGNOSIS — Z1159 Encounter for screening for other viral diseases: Secondary | ICD-10-CM | POA: Diagnosis not present

## 2021-04-29 LAB — COMPREHENSIVE METABOLIC PANEL
ALT: 36 U/L (ref 0–53)
AST: 26 U/L (ref 0–37)
Albumin: 4.2 g/dL (ref 3.5–5.2)
Alkaline Phosphatase: 68 U/L (ref 39–117)
BUN: 11 mg/dL (ref 6–23)
CO2: 28 mEq/L (ref 19–32)
Calcium: 9.4 mg/dL (ref 8.4–10.5)
Chloride: 103 mEq/L (ref 96–112)
Creatinine, Ser: 0.99 mg/dL (ref 0.40–1.50)
GFR: 90.72 mL/min (ref >60.00)
Glucose, Bld: 113 mg/dL — ABNORMAL HIGH (ref 70–99)
Potassium: 4.3 mEq/L (ref 3.5–5.1)
Sodium: 137 mEq/L (ref 135–145)
Total Bilirubin: 1.1 mg/dL (ref 0.2–1.2)
Total Protein: 6.9 g/dL (ref 6.0–8.3)

## 2021-04-29 LAB — LIPID PANEL
Cholesterol: 145 mg/dL (ref 0–200)
HDL: 40.4 mg/dL (ref >39.00)
LDL Cholesterol: 80 mg/dL (ref 0–99)
NonHDL: 104.65
Total CHOL/HDL Ratio: 4
Triglycerides: 124 mg/dL (ref 0.0–149.0)
VLDL: 24.8 mg/dL (ref 0.0–40.0)

## 2021-04-30 LAB — HEPATITIS C ANTIBODY
Hepatitis C Ab: NONREACTIVE
SIGNAL TO CUT-OFF: 0 (ref <1.00)

## 2021-05-04 ENCOUNTER — Encounter: Payer: Self-pay | Admitting: Family Medicine

## 2021-05-04 ENCOUNTER — Ambulatory Visit (INDEPENDENT_AMBULATORY_CARE_PROVIDER_SITE_OTHER): Payer: BC Managed Care – PPO | Admitting: Family Medicine

## 2021-05-04 ENCOUNTER — Other Ambulatory Visit: Payer: Self-pay

## 2021-05-04 VITALS — BP 122/76 | HR 78 | Temp 97.7°F | Ht 69.0 in | Wt 244.4 lb

## 2021-05-04 DIAGNOSIS — Z9049 Acquired absence of other specified parts of digestive tract: Secondary | ICD-10-CM

## 2021-05-04 DIAGNOSIS — R1011 Right upper quadrant pain: Secondary | ICD-10-CM | POA: Insufficient documentation

## 2021-05-04 DIAGNOSIS — J45991 Cough variant asthma: Secondary | ICD-10-CM

## 2021-05-04 DIAGNOSIS — R739 Hyperglycemia, unspecified: Secondary | ICD-10-CM

## 2021-05-04 DIAGNOSIS — R21 Rash and other nonspecific skin eruption: Secondary | ICD-10-CM

## 2021-05-04 DIAGNOSIS — K760 Fatty (change of) liver, not elsewhere classified: Secondary | ICD-10-CM

## 2021-05-04 DIAGNOSIS — G4733 Obstructive sleep apnea (adult) (pediatric): Secondary | ICD-10-CM

## 2021-05-04 DIAGNOSIS — I10 Essential (primary) hypertension: Secondary | ICD-10-CM | POA: Diagnosis not present

## 2021-05-04 DIAGNOSIS — K219 Gastro-esophageal reflux disease without esophagitis: Secondary | ICD-10-CM | POA: Diagnosis not present

## 2021-05-04 DIAGNOSIS — Z0000 Encounter for general adult medical examination without abnormal findings: Secondary | ICD-10-CM

## 2021-05-04 DIAGNOSIS — R42 Dizziness and giddiness: Secondary | ICD-10-CM

## 2021-05-04 LAB — POCT GLYCOSYLATED HEMOGLOBIN (HGB A1C): Hemoglobin A1C: 5.4 % (ref 4.0–5.6)

## 2021-05-04 MED ORDER — TRIAMCINOLONE ACETONIDE 0.1 % EX CREA
1.0000 | TOPICAL_CREAM | Freq: Two times a day (BID) | CUTANEOUS | 0 refills | Status: DC
Start: 2021-05-04 — End: 2021-07-15

## 2021-05-04 NOTE — Assessment & Plan Note (Signed)
Will need to verify if he uses CPAP

## 2021-05-04 NOTE — Assessment & Plan Note (Signed)
Recurrent RUQ abd pain reminiscent of prior biliary colic that occurred x2 nights last week.  Will check RUQ [place] eval for biliary ductal dilation. If recurrent, he will call gen surgery for f/u.

## 2021-05-04 NOTE — Assessment & Plan Note (Signed)
Stable period with PRN albuterol.  

## 2021-05-04 NOTE — Assessment & Plan Note (Signed)
Chronic, managed on PPI BID.

## 2021-05-04 NOTE — Patient Instructions (Signed)
We will order liver ultrasound for further evaluation of recent abdominal pain. Keep snack available to eat if you have further lightheaded episodes.  Good to see you today Return as needed or in 1 year for next physical.   Health Maintenance, Male Adopting a healthy lifestyle and getting preventive care are important in promoting health and wellness. Ask your health care provider about:  The right schedule for you to have regular tests and exams.  Things you can do on your own to prevent diseases and keep yourself healthy. What should I know about diet, weight, and exercise? Eat a healthy diet  Eat a diet that includes plenty of vegetables, fruits, low-fat dairy products, and lean protein.  Do not eat a lot of foods that are high in solid fats, added sugars, or sodium.   Maintain a healthy weight Body mass index (BMI) is a measurement that can be used to identify possible weight problems. It estimates body fat based on height and weight. Your health care provider can help determine your BMI and help you achieve or maintain a healthy weight. Get regular exercise Get regular exercise. This is one of the most important things you can do for your health. Most adults should:  Exercise for at least 150 minutes each week. The exercise should increase your heart rate and make you sweat (moderate-intensity exercise).  Do strengthening exercises at least twice a week. This is in addition to the moderate-intensity exercise.  Spend less time sitting. Even light physical activity can be beneficial. Watch cholesterol and blood lipids Have your blood tested for lipids and cholesterol at 48 years of age, then have this test every 5 years. You may need to have your cholesterol levels checked more often if:  Your lipid or cholesterol levels are high.  You are older than 48 years of age.  You are at high risk for heart disease. What should I know about cancer screening? Many types of cancers can  be detected early and may often be prevented. Depending on your health history and family history, you may need to have cancer screening at various ages. This may include screening for:  Colorectal cancer.  Prostate cancer.  Skin cancer.  Lung cancer. What should I know about heart disease, diabetes, and high blood pressure? Blood pressure and heart disease  High blood pressure causes heart disease and increases the risk of stroke. This is more likely to develop in people who have high blood pressure readings, are of African descent, or are overweight.  Talk with your health care provider about your target blood pressure readings.  Have your blood pressure checked: ? Every 3-5 years if you are 16-72 years of age. ? Every year if you are 48 years old or older.  If you are between the ages of 64 and 44 and are a current or former smoker, ask your health care provider if you should have a one-time screening for abdominal aortic aneurysm (AAA). Diabetes Have regular diabetes screenings. This checks your fasting blood sugar level. Have the screening done:  Once every three years after age 63 if you are at a normal weight and have a low risk for diabetes.  More often and at a younger age if you are overweight or have a high risk for diabetes. What should I know about preventing infection? Hepatitis B If you have a higher risk for hepatitis B, you should be screened for this virus. Talk with your health care provider to find out if  you are at risk for hepatitis B infection. Hepatitis C Blood testing is recommended for:  Everyone born from [birth year] through 1963.  Anyone with known risk factors for hepatitis C. Sexually transmitted infections (STIs)  You should be screened each year for STIs, including gonorrhea and chlamydia, if: ? You are sexually active and are younger than 48 years of age. ? You are older than 48 years of age and your health care provider tells you that you are at risk  for this type of infection. ? Your sexual activity has changed since you were last screened, and you are at increased risk for chlamydia or gonorrhea. Ask your health care provider if you are at risk.  Ask your health care provider about whether you are at high risk for HIV. Your health care provider may recommend a prescription medicine to help prevent HIV infection. If you choose to take medicine to prevent HIV, you should first get tested for HIV. You should then be tested every 3 months for as long as you are taking the medicine. Follow these instructions at home: Lifestyle  Do not use any products that contain nicotine or tobacco, such as cigarettes, e-cigarettes, and chewing tobacco. If you need help quitting, ask your health care provider.  Do not use street drugs.  Do not share needles.  Ask your health care provider for help if you need support or information about quitting drugs. Alcohol use  Do not drink alcohol if your health care provider tells you not to drink.  If you drink alcohol: ? Limit how much you have to 0-2 drinks a day. ? Be aware of how much alcohol is in your drink. In the [place] one drink equals one 12 oz bottle of beer (355 mL), one 5 oz glass of wine (148 mL), or one 1 oz glass of hard liquor (44 mL). General instructions  Schedule regular health, dental, and eye exams.  Stay current with your vaccines.  Tell your health care provider if: ? You often feel depressed. ? You have ever been abused or do not feel safe at home. Summary  Adopting a healthy lifestyle and getting preventive care are important in promoting health and wellness.  Follow your health care provider's instructions about healthy diet, exercising, and getting tested or screened for diseases.  Follow your health care provider's instructions on monitoring your cholesterol and blood pressure. This information is not intended to replace advice given to you by your health care provider.  Make sure you discuss any questions you have with your health care provider. Document Revised: 11/29/2018 Document Reviewed: 11/29/2018 Elsevier Patient Education  2021 [employer].

## 2021-05-04 NOTE — Assessment & Plan Note (Signed)
Chronic, stable on current regimen - continue. 

## 2021-05-04 NOTE — Assessment & Plan Note (Signed)
Preventative protocols reviewed and updated unless pt declined. Discussed healthy diet and lifestyle.  

## 2021-05-04 NOTE — Assessment & Plan Note (Signed)
Itchy pruritic erythematous bumps to RLE - Rx as steroid responsive dermatosis with triamcinolone cream course, reviewed topical steroid precautions.

## 2021-05-04 NOTE — Assessment & Plan Note (Signed)
Encouraged healthy diet and lifestyle for sustainable weight loss goal.

## 2021-05-04 NOTE — Assessment & Plan Note (Signed)
?  hypoglycemia related as it improves with PO intake.  rec carry snack with him, check A1c today.

## 2021-05-04 NOTE — Progress Notes (Signed)
Patient ID: Jordan Buchanan, male    DOB: 1971/07/01, 50 y.o.   MRN: [id number]  This visit was conducted in person.  BP 122/76   Pulse 78   Temp 97.7 F (36.5 C) (Temporal)   Ht 5\' 9"  (1.753 m)   Wt 244 lb 6 oz (110.8 kg)   SpO2 96%   BMI 36.09 kg/m    CC: CPE  Subjective:   HPI: Jordan Buchanan is a 51 y.o. male presenting on 05/04/2021 for Annual Exam   Prolonged hospitalization summer 2021 for perforated diverticulitis complicated by acute cholecystitis s/p cholecystectomy. He had drain placed for a few weeks after this. Had diverticulitis surgery s/p robotic rectosigmoid resection LAR 05/2020. Continues fiber daily.   Over the weekend he had recurrent RUQ pain similar to prior cholecystitis pain. He has been very active recently ?MSK - pain was reproducible. He did have more greasy diet than normal.   Notes intermittent dizzy spells associated with clamminess, sweats, malaise - can happen about once a month, started about 6 months ago. Does feel better after he's eaten something.   Preventative: Colonoscopy WNL (Isami Lysle Pearl) - done prior to diverticulitis surgery Lung cancer screen - not eligible Cardiac history - had coronary calcium score of 0 (06/2019) Flu shot yearly Tdap 2019 [place] 02/2020, 03/2020 Seat belt use discussed Sunscreen use discussed. No changing moles on skin. 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 Interim medical history since our last visit reviewed. Allergies and medications reviewed and updated. Outpatient Medications Prior to Visit  Medication Sig Dispense  Refill  . albuterol (VENTOLIN HFA) 108 (90 Base) MCG/ACT inhaler Inhale 2 puffs into the lungs every 4 (four) hours as needed for wheezing or shortness of breath. 1 each 5  . amLODipine (NORVASC) 10 MG tablet TAKE 1 TABLET BY MOUTH EVERY DAY 90 tablet 0  . cetirizine (ZYRTEC) 10 MG tablet Take 10 mg by mouth daily.    . metoprolol succinate (TOPROL-XL) 50 MG 24 hr tablet Take 1 tablet (50 mg total) by mouth daily. Take with or immediately following a meal. 90 tablet 3  . Multiple Vitamins-Minerals (MULTIVITAMIN MEN PO) Take by mouth daily.    Marland Kitchen omega-3 acid ethyl esters (LOVAZA) 1 g capsule Take 1 g by mouth daily.    . pantoprazole (PROTONIX) 40 MG tablet Take 40 mg by mouth 2 (two) times daily.     . benzonatate (TESSALON) 100 MG capsule Take 2 capsules (200 mg total) by mouth 3 (three) times daily as needed for cough. 21 capsule 0  . Spacer/Aero-Holding Chambers (AEROCHAMBER MV) inhaler Use as instructed 1 each 2   No facility-administered medications prior to visit.     Per HPI unless specifically indicated in ROS section below Review of Systems  Constitutional: Negative for activity change, appetite change, chills, fatigue, fever and unexpected weight change.  HENT: Negative for hearing loss.   Eyes: Negative for visual disturbance.  Respiratory: Negative for cough, chest tightness, shortness of breath and wheezing.   Cardiovascular: Positive for leg swelling. Negative for chest pain and palpitations.  Gastrointestinal: Positive for abdominal pain (RUQ pain - see above). Negative for abdominal distention, blood in stool, constipation, diarrhea, nausea and vomiting.  Genitourinary: Negative for difficulty urinating and hematuria.  Musculoskeletal: Negative for arthralgias, myalgias and neck pain.  Skin: Negative for rash.  Neurological: Negative for dizziness, seizures, syncope and headaches.  Hematological: Negative for adenopathy. Does not bruise/bleed easily.   Psychiatric/Behavioral: Negative for dysphoric mood. The patient is not nervous/anxious.    Objective:  BP 122/76   Pulse 78   Temp 97.7 F (36.5 C) (Temporal)   Ht 5\' 9"  (1.753 m)   Wt 244 lb 6 oz (110.8 kg)   SpO2 96%   BMI 36.09 kg/m   Wt Readings from Last 3 Encounters:  05/04/21 244 lb 6 oz (110.8 kg)  12/24/20 235 lb (106.6 kg)  06/20/20 232 lb 5.8 oz (105.4 kg)      Physical Exam Vitals and nursing note reviewed.  Constitutional:      General: He is not in acute distress.    Appearance: Normal appearance. He is well-developed. He is obese. He is not ill-appearing.  HENT:     Head: Normocephalic and atraumatic.     Right Ear: Hearing, tympanic membrane, ear canal and external ear normal.     Left Ear: Hearing, tympanic membrane, ear canal and external ear normal.  Eyes:     General: No scleral icterus.    Extraocular Movements: Extraocular movements intact.     Conjunctiva/sclera: Conjunctivae normal.     Pupils: Pupils are equal, round, and reactive to light.  Neck:     Thyroid: No thyroid mass or thyromegaly.  Cardiovascular:     Rate and Rhythm: Normal rate and regular rhythm.     Pulses: Normal pulses.          Radial pulses are 2+ on the right side and 2+ on the left side.     Heart sounds: Normal heart sounds. No murmur heard.   Pulmonary:     Effort: Pulmonary effort is normal. No respiratory distress.     Breath sounds: Normal breath sounds. No wheezing, rhonchi or rales.  Abdominal:     General: Bowel sounds are normal. There is no distension.     Palpations: Abdomen is soft. There is no mass.     Tenderness: There is no abdominal tenderness. There is no guarding or rebound.     Hernia: No hernia is present.  Musculoskeletal:        General: Normal range of motion.     Cervical back: Normal range of motion and neck supple.     Right lower leg: No edema.     Left lower leg: No edema.  Lymphadenopathy:     Cervical: No cervical adenopathy.   Skin:    General: Skin is warm and dry.     Findings: No rash.  Neurological:     General: No focal deficit present.     Mental Status: He is alert and oriented to person, place, and time.     Comments: CN grossly intact, station and gait intact  Psychiatric:        Mood and Affect: Mood normal.        Behavior: Behavior normal.        Thought Content: Thought content normal.        Judgment: Judgment normal.       Results for orders placed or performed in visit on 05/04/21  POCT glycosylated hemoglobin (Hb A1C)  Result  Value Ref Range   Hemoglobin A1C 5.4 4.0 - 5.6 %   HbA1c POC (<> result, manual entry)     HbA1c, POC (prediabetic range)     HbA1c, POC (controlled diabetic range)     Assessment & Plan:  This visit occurred during the SARS-CoV-2 public health emergency.  Safety protocols were in place, including screening questions prior to the visit, additional usage of staff PPE, and extensive cleaning of exam room while observing appropriate contact time as indicated for disinfecting solutions.   Problem List Items Addressed This Visit    Hypertension    Chronic, stable on current regimen - continue      GERD (gastroesophageal reflux disease)    Chronic, managed on PPI BID.       Hepatic steatosis    By prior imaging.       Cough variant asthma     Stable period with PRN albuterol       Severe obesity (BMI 35.0-39.9) with comorbidity (Geraldine)    Encouraged healthy diet and lifestyle for sustainable weight loss goal.       OSA (obstructive sleep apnea)    Will need to verify if he uses CPAP      Skin rash    Itchy pruritic erythematous bumps to RLE - Rx as steroid responsive dermatosis with triamcinolone cream course, reviewed topical steroid precautions.       Health maintenance examination - Primary    Preventative protocols reviewed and updated unless pt declined. Discussed healthy diet and lifestyle.       Acute calculous cholecystitis s/p lap chole  04/2020   RUQ abdominal pain    Recurrent RUQ abd pain reminiscent of prior biliary colic that occurred x2 nights last week.  Will check RUQ [place] eval for biliary ductal dilation. If recurrent, he will call gen surgery for f/u.       Relevant Orders   US ABDOMEN LIMITED RUQ (LIVER/GB)   Episodic lightheadedness    ?hypoglycemia related as it improves with PO intake.  rec carry snack with him, check A1c today.        Other Visit Diagnoses    Hyperglycemia       Relevant Orders   POCT glycosylated hemoglobin (Hb A1C) (Completed)       Meds ordered this encounter  Medications  . triamcinolone cream (KENALOG) 0.1 %    Sig: Apply 1 application topically 2 (two) times daily. Apply to AA.    Dispense:  45 g    Refill:  0   Orders Placed This Encounter  Procedures  . US ABDOMEN LIMITED RUQ (LIVER/GB)    Standing Status:   Future    Standing Expiration Date:   05/04/2022    Order Specific Question:   Reason for Exam (SYMPTOM  OR DIAGNOSIS REQUIRED)    Answer:   RUQ abd pain s/p cholecystectomy    Order Specific Question:   Preferred imaging location?    Answer:   [facility]  . POCT glycosylated hemoglobin (Hb A1C)    Patient instructions: We will order liver ultrasound for further evaluation of recent abdominal pain. Keep snack available to eat if you have further lightheaded episodes.  Good to see you today Return as needed or in 1 year for next physical.   Follow up plan: Return in about 1 year (around 05/04/2022) for annual exam, prior fasting for blood work.  Ria Bush, MD

## 2021-05-04 NOTE — Assessment & Plan Note (Signed)
By prior imaging.

## 2021-05-10 ENCOUNTER — Other Ambulatory Visit: Payer: Self-pay | Admitting: Family Medicine

## 2021-05-14 ENCOUNTER — Ambulatory Visit (HOSPITAL_COMMUNITY): Payer: BC Managed Care – PPO

## 2021-05-30 DIAGNOSIS — R0602 Shortness of breath: Secondary | ICD-10-CM | POA: Diagnosis not present

## 2021-05-30 DIAGNOSIS — K651 Peritoneal abscess: Secondary | ICD-10-CM | POA: Diagnosis not present

## 2021-05-30 DIAGNOSIS — I10 Essential (primary) hypertension: Secondary | ICD-10-CM | POA: Diagnosis not present

## 2021-05-30 DIAGNOSIS — K2960 Other gastritis without bleeding: Secondary | ICD-10-CM | POA: Diagnosis not present

## 2021-05-30 DIAGNOSIS — Z9049 Acquired absence of other specified parts of digestive tract: Secondary | ICD-10-CM | POA: Diagnosis not present

## 2021-05-30 DIAGNOSIS — R079 Chest pain, unspecified: Secondary | ICD-10-CM | POA: Diagnosis not present

## 2021-06-27 ENCOUNTER — Other Ambulatory Visit: Payer: Self-pay | Admitting: Family Medicine

## 2021-07-15 ENCOUNTER — Encounter: Payer: Self-pay | Admitting: Family Medicine

## 2021-07-15 ENCOUNTER — Other Ambulatory Visit: Payer: Self-pay

## 2021-07-15 ENCOUNTER — Ambulatory Visit: Payer: BC Managed Care – PPO | Admitting: Family Medicine

## 2021-07-15 VITALS — BP 130/80 | HR 84 | Temp 98.0°F | Ht 69.0 in | Wt 245.0 lb

## 2021-07-15 DIAGNOSIS — J3489 Other specified disorders of nose and nasal sinuses: Secondary | ICD-10-CM

## 2021-07-15 NOTE — Progress Notes (Signed)
    Jordan Buchanan T. Beyonce Sawatzky, MD, [place] [facility] [place], [address]  Phone: [phone]  FAX: [phone]  Jordan Buchanan - 50 y.o. male  MRN [id number]  Date of Birth: [birth year]  Date: 07/15/2021  PCP: Ria Bush, MD  Referral: Ria Bush, MD  Chief Complaint  Patient presents with  . Growth inside Left Nostril    This visit occurred during the SARS-CoV-2 public health emergency.  Safety protocols were in place, including screening questions prior to the visit, additional usage of staff PPE, and extensive cleaning of exam room while observing appropriate contact time as indicated for disinfecting solutions.   Subjective:   Jordan Buchanan is a 50 y.o. very pleasant male patient with Body mass index is 36.18 kg/m. who presents with the following:  L nostril growth.  Has been there for about six weeks ago.  Over the past year or so, but has 18 skin tags removed.  This area on the mucosal surface of the left nares is and has been growing over the last 6 weeks.  It is not painful, and has not been bleeding.  He has noted history of trauma or injury.    Review of Systems is noted in the HPI, as appropriate  Objective:   BP 130/80   Pulse 84   Temp 98 F (36.7 C) (Temporal)   Ht '5\' 9"'$  (1.753 m)   Wt 245 lb (111.1 kg)   SpO2 97%   BMI 36.18 kg/m   GEN: No acute distress; alert,appropriate. PULM: Breathing comfortably in no respiratory distress PSYCH: Normally interactive.  It is approximately 3 mm in length and 1 mm across.  Has somewhat of verrucous appearance.  Laboratory and Imaging Data:  Assessment and Plan:     ICD-10-CM   1. Lesion of nostril  J34.89 Ambulatory referral to ENT     I am not convinced that this is a skin tag.  I would like to get a additional opinion by ENT.  Hopefully then they will think if this is a benign thing that can be removed without issue.   Orders Placed  This Encounter  Procedures  . Ambulatory referral to ENT     Signed,  Frederico Hamman T. Dyami Umbach, MD   Outpatient Encounter Medications as of 07/15/2021  Medication Sig  . albuterol (VENTOLIN HFA) 108 (90 Base) MCG/ACT inhaler Inhale 2 puffs into the lungs every 4 (four) hours as needed for wheezing or shortness of breath.  Marland Kitchen amLODipine (NORVASC) 10 MG tablet TAKE 1 TABLET BY MOUTH EVERY DAY  . cetirizine (ZYRTEC) 10 MG tablet Take 10 mg by mouth daily.  . metoprolol succinate (TOPROL-XL) 50 MG 24 hr tablet TAKE 1 TABLET (50 MG TOTAL) BY MOUTH DAILY. TAKE WITH OR IMMEDIATELY FOLLOWING A MEAL.  . Multiple Vitamins-Minerals (MULTIVITAMIN MEN PO) Take by mouth daily.  Marland Kitchen omega-3 acid ethyl esters (LOVAZA) 1 g capsule Take 1 g by mouth daily.  . sucralfate (CARAFATE) 1 g tablet Take 1 g by mouth 4 (four) times daily.  . pantoprazole (PROTONIX) 40 MG tablet Take 40 mg by mouth 2 (two) times daily.   . [DISCONTINUED] triamcinolone cream (KENALOG) 0.1 % Apply 1 application topically 2 (two) times daily. Apply to AA.   No facility-administered encounter medications on file as of 07/15/2021.

## 2021-07-16 ENCOUNTER — Encounter: Payer: Self-pay | Admitting: Family Medicine

## 2021-07-29 DIAGNOSIS — D140 Benign neoplasm of middle ear, nasal cavity and accessory sinuses: Secondary | ICD-10-CM | POA: Diagnosis not present

## 2021-07-29 DIAGNOSIS — D2239 Melanocytic nevi of other parts of face: Secondary | ICD-10-CM | POA: Diagnosis not present

## 2021-07-29 DIAGNOSIS — R238 Other skin changes: Secondary | ICD-10-CM | POA: Diagnosis not present

## 2021-08-27 ENCOUNTER — Other Ambulatory Visit: Payer: Self-pay

## 2021-08-27 ENCOUNTER — Ambulatory Visit: Payer: BC Managed Care – PPO | Admitting: Dermatology

## 2021-08-27 DIAGNOSIS — D229 Melanocytic nevi, unspecified: Secondary | ICD-10-CM

## 2021-08-27 DIAGNOSIS — L578 Other skin changes due to chronic exposure to nonionizing radiation: Secondary | ICD-10-CM | POA: Diagnosis not present

## 2021-08-27 DIAGNOSIS — Z1283 Encounter for screening for malignant neoplasm of skin: Secondary | ICD-10-CM | POA: Diagnosis not present

## 2021-08-27 DIAGNOSIS — Z86018 Personal history of other benign neoplasm: Secondary | ICD-10-CM | POA: Diagnosis not present

## 2021-08-27 DIAGNOSIS — L820 Inflamed seborrheic keratosis: Secondary | ICD-10-CM | POA: Diagnosis not present

## 2021-08-27 DIAGNOSIS — L814 Other melanin hyperpigmentation: Secondary | ICD-10-CM | POA: Diagnosis not present

## 2021-08-27 DIAGNOSIS — D1800 Hemangioma unspecified site: Secondary | ICD-10-CM

## 2021-08-27 DIAGNOSIS — L821 Other seborrheic keratosis: Secondary | ICD-10-CM

## 2021-08-27 NOTE — Patient Instructions (Addendum)

## 2021-08-27 NOTE — Progress Notes (Signed)
Follow-Up Visit   Subjective  Jordan Buchanan is a 50 y.o. male who presents for the following: Annual Exam (Mole check ). Yearly mole check, hx of Dysplastic nevus. Pt c/o irritated spots on his body.  The patient presents for Total-Body Skin Exam (TBSE) for skin cancer screening and mole check.   The following portions of the chart were reviewed this encounter and updated as appropriate:   Tobacco  Allergies  Meds  Problems  Med Hx  Surg Hx  Fam Hx     Review of Systems:  No other skin or systemic complaints except as noted in HPI or Assessment and Plan.  Objective  Well appearing patient in no apparent distress; mood and affect are within normal limits.  A full examination was performed including scalp, head, eyes, ears, nose, lips, neck, chest, axillae, abdomen, back, buttocks, bilateral upper extremities, bilateral lower extremities, hands, feet, fingers, toes, fingernails, and toenails. All findings within normal limits unless otherwise noted below.  right sup forehead, right arm near elbow  (2) (2) Erythematous keratotic or waxy stuck-on papule or plaque.    Assessment & Plan  Inflamed seborrheic keratosis right sup forehead, right arm near elbow  (2)  Destruction of lesion - right sup forehead, right arm near elbow  (2) Complexity: simple   Destruction method: cryotherapy   Informed consent: discussed and consent obtained   Timeout:  patient name, date of birth, surgical site, and procedure verified Lesion destroyed using liquid nitrogen: Yes   Region frozen until ice ball extended beyond lesion: Yes   Outcome: patient tolerated procedure well with no complications   Post-procedure details: wound care instructions given    Skin cancer screening  Lentigines - Scattered tan macules - Due to sun exposure - Benign-appering, observe - Recommend daily broad spectrum sunscreen SPF 30+ to sun-exposed areas, reapply every 2 hours as needed. - Call for any  changes  Seborrheic Keratoses - Stuck-on, waxy, tan-brown papules and/or plaques  - Benign-appearing - Discussed benign etiology and prognosis. - Observe - Call for any changes  Melanocytic Nevi - Tan-brown and/or pink-flesh-colored symmetric macules and papules - Benign appearing on exam today - Observation - Call clinic for new or changing moles - Recommend daily use of broad spectrum spf 30+ sunscreen to sun-exposed areas.   Hemangiomas - Red papules - Discussed benign nature - Observe - Call for any changes  Actinic Damage - Chronic condition, secondary to cumulative UV/sun exposure - diffuse scaly erythematous macules with underlying dyspigmentation - Recommend daily broad spectrum sunscreen SPF 30+ to sun-exposed areas, reapply every 2 hours as needed.  - Staying in the shade or wearing long sleeves, sun glasses (UVA+UVB protection) and wide brim hats (4-inch brim around the entire circumference of the hat) are also recommended for sun protection.  - Call for new or changing lesions.  Skin cancer screening performed today.   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  - Call if any new or changing lesions are noted between office visits   Return in about 1 year (around 08/27/2022) for TBSE, hx of Dysplastic nevus .  IMarye Round, CMA, am acting as scribe for Sarina Ser, MD .  Documentation: I have reviewed the above documentation for accuracy and completeness, and I agree with the above.  Shanon Brow  Nehemiah Massed, MD

## 2021-09-01 ENCOUNTER — Encounter: Payer: Self-pay | Admitting: Dermatology

## 2021-09-08 DIAGNOSIS — K2950 Unspecified chronic gastritis without bleeding: Secondary | ICD-10-CM | POA: Diagnosis not present

## 2021-09-08 DIAGNOSIS — Z8719 Personal history of other diseases of the digestive system: Secondary | ICD-10-CM | POA: Diagnosis not present

## 2021-09-08 DIAGNOSIS — K219 Gastro-esophageal reflux disease without esophagitis: Secondary | ICD-10-CM | POA: Diagnosis not present

## 2021-09-14 DIAGNOSIS — E119 Type 2 diabetes mellitus without complications: Secondary | ICD-10-CM | POA: Diagnosis not present

## 2021-09-14 DIAGNOSIS — H524 Presbyopia: Secondary | ICD-10-CM | POA: Diagnosis not present

## 2021-12-10 ENCOUNTER — Ambulatory Visit: Payer: BC Managed Care – PPO | Admitting: Nurse Practitioner

## 2022-01-11 DIAGNOSIS — J069 Acute upper respiratory infection, unspecified: Secondary | ICD-10-CM | POA: Diagnosis not present

## 2022-01-25 ENCOUNTER — Other Ambulatory Visit: Payer: Self-pay

## 2022-01-25 ENCOUNTER — Emergency Department (HOSPITAL_BASED_OUTPATIENT_CLINIC_OR_DEPARTMENT_OTHER): Payer: BC Managed Care – PPO | Admitting: Radiology

## 2022-01-25 ENCOUNTER — Encounter (HOSPITAL_BASED_OUTPATIENT_CLINIC_OR_DEPARTMENT_OTHER): Payer: Self-pay | Admitting: Urology

## 2022-01-25 ENCOUNTER — Emergency Department (HOSPITAL_BASED_OUTPATIENT_CLINIC_OR_DEPARTMENT_OTHER)
Admission: EM | Admit: 2022-01-25 | Discharge: 2022-01-25 | Disposition: A | Discharge status: Home / Self Care | Payer: BC Managed Care – PPO | Attending: Emergency Medicine | Admitting: Emergency Medicine

## 2022-01-25 DIAGNOSIS — Z79899 Other long term (current) drug therapy: Secondary | ICD-10-CM | POA: Diagnosis not present

## 2022-01-25 DIAGNOSIS — J45991 Cough variant asthma: Secondary | ICD-10-CM | POA: Diagnosis not present

## 2022-01-25 DIAGNOSIS — R42 Dizziness and giddiness: Secondary | ICD-10-CM | POA: Diagnosis not present

## 2022-01-25 DIAGNOSIS — M25512 Pain in left shoulder: Secondary | ICD-10-CM | POA: Diagnosis not present

## 2022-01-25 DIAGNOSIS — I10 Essential (primary) hypertension: Secondary | ICD-10-CM | POA: Diagnosis not present

## 2022-01-25 DIAGNOSIS — R079 Chest pain, unspecified: Secondary | ICD-10-CM | POA: Diagnosis not present

## 2022-01-25 DIAGNOSIS — Z711 Person with feared health complaint in whom no diagnosis is made: Secondary | ICD-10-CM

## 2022-01-25 DIAGNOSIS — Z87891 Personal history of nicotine dependence: Secondary | ICD-10-CM | POA: Diagnosis not present

## 2022-01-25 DIAGNOSIS — Z8616 Personal history of COVID-19: Secondary | ICD-10-CM | POA: Insufficient documentation

## 2022-01-25 LAB — BASIC METABOLIC PANEL
Anion gap: 10 (ref 5–15)
BUN: 10 mg/dL (ref 6–20)
CO2: 24 mmol/L (ref 22–32)
Calcium: 9.5 mg/dL (ref 8.9–10.3)
Chloride: 103 mmol/L (ref 98–111)
Creatinine, Ser: 0.97 mg/dL (ref 0.61–1.24)
GFR, Estimated: >60 mL/min (ref >60)
Glucose, Bld: 104 mg/dL — ABNORMAL HIGH (ref 70–99)
Potassium: 3.8 mmol/L (ref 3.5–5.1)
Sodium: 137 mmol/L (ref 135–145)

## 2022-01-25 LAB — CBC
HCT: 45.1 % (ref 39.0–52.0)
Hemoglobin: 15.8 g/dL (ref 13.0–17.0)
MCH: 29.8 pg (ref 26.0–34.0)
MCHC: 35 g/dL (ref 30.0–36.0)
MCV: 85.1 fL (ref 80.0–100.0)
Platelets: 293 10*3/uL (ref 150–400)
RBC: 5.3 MIL/uL (ref 4.22–5.81)
RDW: 12.8 % (ref 11.5–15.5)
WBC: 10 10*3/uL (ref 4.0–10.5)
nRBC: 0 % (ref 0.0–0.2)

## 2022-01-25 LAB — TROPONIN I (HIGH SENSITIVITY)
Troponin I (High Sensitivity): 3 ng/L (ref <18)
Troponin I (High Sensitivity): 3 ng/L (ref <18)

## 2022-01-25 NOTE — ED Provider Notes (Signed)
[facility] Provider Note: Jordan Spurling, MD, FACEP  CSN: [id number] MRN: [id number] ARRIVAL: 01/25/22 at [place]  01/25/22 11:02 PM Jordan Buchanan is a 51 y.o. male who has been having dizzy spells for the past 2 days.  He describes the dizzy spells as sense of lightheadedness.  They last several minutes and he has not actually passed out during these episodes.  During these episodes he has no chest pain, shortness of breath or palpitations.  At home today he noticed his blood pressure was elevated (163/103 and similar readings).  He called his physicians nurse line and was told to call 911 because he had also been having shoulder pain for the past 2 days.  He describes the shoulder pain as feeling like he threw a baseball too hard.  The pain is worse with movement of the left arm, and he rates it as a 6 out of 10.  The arm pain does not vary with his blood pressure or his lightheadedness.  His blood pressures been the emergency department have all been normal.  He denies any symptoms currently other than his shoulder pain with movement.   Past Medical History:  Diagnosis Date   Acute calculous cholecystitis s/p lap cholecystectomy 04/30/2020 04/30/2020   Arrhythmia    PALPITATIONS AND PVC - improved with cutting down on caffeine   Cough variant asthma 05/28/2014   05/27/2014 p extensive coaching HFA effectiveness =    75% > try dulera 100 2bid  - PFT's 07/03/2014 FEV1 4.15 (98%) ratio 89 and no chang p B2 and nl fef25-75  And nl dlco     COVID-19 virus infection 11/2020   Diverticulitis of large intestine with perforation and abscess s/p lap drainage & washout 04/30/2020 04/13/2020   Diverticulosis 07/2015   by CT scan   Dysplastic nevus 05/15/2020   Left lat. base of neck. Moderate atypia, lateral margin involved.    GERD (gastroesophageal reflux disease)    Hepatic steatosis    Hypertension    h/o LVH, resolved    Migraines    Perforated diverticulum of large intestine 03/2020   hospitalization   Pneumonia    Prostatitis 05/09/2013   Seasonal allergies    Spondyloarthropathy    ?AS, HLA-B27 +, CCP neg, prior on humira (Dr. Estanislado Pandy)   Tremor    Urticaria     Past Surgical History:  Procedure Laterality Date   CHOLECYSTECTOMY N/A 04/30/2020   Procedure: LAPAROSCOPIC CHOLECYSTECTOMY WITH INTRAOPERATIVE CHOLANGIOGRAM, BILATERAL TAP BLOCK;  Surgeon: Michael Boston, MD;  Location: [facility];  Service: General;  Laterality: N/A;   ESOPHAGOGASTRODUODENOSCOPY  08/2018   esophagus dilated, biopsy showed chronic gastritis with mod reflux changes ([place])   LAPAROSCOPY N/A 04/30/2020   Procedure: LAPAROSCOPY DIAGNOSTIC WITH WASHOUT AND DRAINAGE PLACEMENT, DRAINAGE OF DIVERTICULITIS ABSCESS;  Surgeon: Michael Boston, MD;  Location: [facility];  Service: General;  Laterality: N/A;   NASAL RECONSTRUCTION WITH SEPTAL REPAIR  2017   [place]   OTHER SURGICAL HISTORY  1992   facial surgery   PROCTOSCOPY N/A 06/17/2020   Procedure: RIGID PROCTOSCOPY;  Surgeon: Michael Boston, MD;  Location: [facility];  Service: General;  Laterality: N/A;   ROBOTIC LOW ANTERIOR RECTOSIGMOID RESECTION  06/17/2020   for recurrent L sided diverticulitis ([place])   ROTATOR CUFF REPAIR  2009   left   SINOSCOPY     UMBILICAL HERNIA REPAIR  05/10/2012   Procedure: HERNIA REPAIR  UMBILICAL ADULT;  Surgeon: Harl Bowie, MD;  Location: [facility];  Service: General;  Laterality: N/A;    Family History  Problem Relation Age of Onset   Asthma Father    Arthritis Father        father, brother, Pgrandfather   Chronic bronchitis Father    Allergies Father    Hypertension Brother        obese   CAD Paternal Grandfather        MI x2   Multiple sclerosis Brother    Stroke Neg Hx    Cancer Neg Hx     Social History   Tobacco Use   Smoking status: Former    Packs/day: 1.00    Years: 13.00    Pack years: 13.00     Types: Cigarettes    Quit date: 12/21/1999    Years since quitting: 22.1   Smokeless tobacco: Former    Types: Snuff    Quit date: 12/20/2013  Vaping Use   Vaping Use: Never used  Substance Use Topics   Alcohol use: No    Alcohol/week: 0.0 standard drinks    Comment: quit 2002   Drug use: Not Currently    Types: Marijuana    Comment: quit 2002    Prior to Admission medications   Medication Sig Start Date End Date Taking? Authorizing Provider  albuterol (VENTOLIN HFA) 108 (90 Base) MCG/ACT inhaler Inhale 2 puffs into the lungs every 4 (four) hours as needed for wheezing or shortness of breath. 12/24/20   Margarette Canada, NP  amLODipine (NORVASC) 10 MG tablet TAKE 1 TABLET BY MOUTH EVERY DAY 06/29/21   Ria Bush, MD  cetirizine (ZYRTEC) 10 MG tablet Take 10 mg by mouth daily.    [provider]  metoprolol succinate (TOPROL-XL) 50 MG 24 hr tablet TAKE 1 TABLET (50 MG TOTAL) BY MOUTH DAILY. TAKE WITH OR IMMEDIATELY FOLLOWING A MEAL. 05/12/21   Ria Bush, MD  Multiple Vitamins-Minerals (MULTIVITAMIN MEN PO) Take by mouth daily.    [provider]  omega-3 acid ethyl esters (LOVAZA) 1 g capsule Take 1 g by mouth daily.    [provider]  pantoprazole (PROTONIX) 40 MG tablet Take 40 mg by mouth 2 (two) times daily.  09/06/19 12/24/20  [provider]  sucralfate (CARAFATE) 1 g tablet Take 1 g by mouth 4 (four) times daily. 05/30/21   [provider]    Allergies Hctz [hydrochlorothiazide]   REVIEW OF SYSTEMS  Negative except as noted here or in the History of Present Illness.   PHYSICAL EXAMINATION  Initial Vital Signs Blood pressure 126/87, pulse 79, temperature 97.9 F (36.6 C), resp. rate 20, height 5' 9"  (1.753 m), weight 111.1 kg, SpO2 98 %.  Examination General: Well-developed, well-nourished male in no acute distress; appearance consistent with age of record HENT: normocephalic; atraumatic Eyes: Normal appearance Neck:  supple Heart: regular rate and rhythm Lungs: clear to auscultation bilaterally Abdomen: soft; nondistended; nontender; bowel sounds present Extremities: No deformity; full range of motion; pulses normal; pain on movement of left shoulder Neurologic: Awake, alert and oriented; motor function intact in all extremities and symmetric; no facial droop Skin: Warm and dry Psychiatric: Normal mood and affect   RESULTS  Summary of this visit's results, reviewed and interpreted by myself:   EKG Interpretation  Date/Time:  Monday January 25 2022 20:18:11 EST Ventricular Rate:  84 PR Interval:  184 QRS Duration: 90 QT Interval:  346 QTC Calculation: 408  R Axis:   61 Text Interpretation: Normal sinus rhythm Nonspecific T wave abnormality Abnormal ECG When compared with ECG of 29-Apr-2020 02:48, No significant change since last tracing Confirmed by Aletta Edouard [phone]) on 01/25/2022 8:24:04 PM       Laboratory Studies: Results for orders placed or performed during the hospital encounter of 01/25/22 (from the past 24 hour(s))  Basic metabolic panel     Status: Abnormal   Collection Time: 01/25/22  8:21 PM  Result Value Ref Range   Sodium 137 135 - 145 mmol/L   Potassium 3.8 3.5 - 5.1 mmol/L   Chloride 103 98 - 111 mmol/L   CO2 24 22 - 32 mmol/L   Glucose, Bld 104 (H) 70 - 99 mg/dL   BUN 10 6 - 20 mg/dL   Creatinine, Ser 0.97 0.61 - 1.24 mg/dL   Calcium 9.5 8.9 - 10.3 mg/dL   GFR, Estimated >60 >60 mL/min   Anion gap 10 5 - 15  CBC     Status: None   Collection Time: 01/25/22  8:21 PM  Result Value Ref Range   WBC 10.0 4.0 - 10.5 K/uL   RBC 5.30 4.22 - 5.81 MIL/uL   Hemoglobin 15.8 13.0 - 17.0 g/dL   HCT 45.1 39.0 - 52.0 %   MCV 85.1 80.0 - 100.0 fL   MCH 29.8 26.0 - 34.0 pg   MCHC 35.0 30.0 - 36.0 g/dL   RDW 12.8 11.5 - 15.5 %   Platelets 293 150 - 400 K/uL   nRBC 0.0 0.0 - 0.2 %  Troponin I (High Sensitivity)     Status: None   Collection Time: 01/25/22  8:21 PM  Result  Value Ref Range   Troponin I (High Sensitivity) 3 <18 ng/L  Troponin I (High Sensitivity)     Status: None   Collection Time: 01/25/22 10:25 PM  Result Value Ref Range   Troponin I (High Sensitivity) 3 <18 ng/L   Imaging Studies: DG Chest 2 View  Result Date: 01/25/2022 CLINICAL DATA:  Chest pain, dizziness EXAM: CHEST - 2 VIEW COMPARISON:  12/24/2020 FINDINGS: The heart size and mediastinal contours are within normal limits. Both lungs are clear. The visualized skeletal structures are unremarkable. IMPRESSION: No active cardiopulmonary disease. Electronically Signed   By: Elmer Picker M.D.   On: 01/25/2022 20:42    ED COURSE and MDM  Nursing notes, initial and subsequent vitals signs, including pulse oximetry, reviewed and interpreted by myself.  Vitals:   01/25/22 2016 01/25/22 2021 01/25/22 2155 01/25/22 2200  BP:  137/86 132/81 126/87  Pulse:  89 74 79  Resp:  18 19 20   Temp:  97.9 F (36.6 C)    SpO2:  98% 98% 98%  Weight: 111.1 kg     Height: 5' 9"  (1.753 m)      Medications - No data to display  The patient's shoulder pain is musculoskeletal in nature and is not consistent with an anginal equivalent.  His blood pressures have been normal in the ED and he was advised to invest in a new blood pressure cuff as his home blood pressure cuff is about 48 years old.  His EKG and troponins are also reassuring.  The cause of his lightheadedness is unclear and he was advised to follow-up with his PCP if symptoms persist.  He has had no arrhythmias in the ED to account for this; the rhythm strip for his entire time on the monitor was reviewed and he was in  normal sinus rhythm with fairly constant rate the entire time.  PROCEDURES  Procedures   ED DIAGNOSES     ICD-10-CM   1. Episodic lightheadedness  R42     2. Concern about heart disease without diagnosis  Z71.1     3. Acute pain of left shoulder  M25.512          Tarin Navarez, Jenny Reichmann, MD 01/25/22 2318

## 2022-01-25 NOTE — ED Triage Notes (Signed)
Dizzy spells on Saturday  BP 163/103  at home  PCP sent due to left shoulder pain that started Saturday as well  Denies SOB

## 2022-01-26 ENCOUNTER — Telehealth: Payer: Self-pay

## 2022-01-26 NOTE — Telephone Encounter (Signed)
Per chart review tab pt was seen at [facility] on 01/25/22. Sending note to Dr Darnell Level and Lattie Haw CMA.

## 2022-01-26 NOTE — Telephone Encounter (Signed)
Noted. Please call for update on symptoms and offer ER f/u visit for HTN and dizziness.

## 2022-01-26 NOTE — Telephone Encounter (Signed)
Noted. Appreciate Matt seeing patient as I will be out of town all next week.

## 2022-01-26 NOTE — Telephone Encounter (Signed)
Spoke with pt asking for update on sxs.  States he feels better.  C/o feeling lightheaded every now and then.  But BP was back to normal this AM- 120s/79.  Offered OV for ER f/u.  Pt states he's going out of town, leaving tonight through the weekend.  Scheduled ER f/u on 02/01/22 at 8:00 with Matt.

## 2022-01-26 NOTE — Telephone Encounter (Signed)
[place] Night - Client TELEPHONE ADVICE RECORD [facility] Patient Name: Jordan Buchanan Gender: Male DOB: [birth year] Age: 51 Y 43 M Return Phone Number: [phone] (Primary) Address[place]  [address] Client [facility] Night - Client Client Site [place] Provider Ria Bush - MD Contact Type Call Who Is Calling Patient / Member / Family / Caregiver Call Type Triage / Clinical Relationship To Patient Self Return Phone Number [phone] (Primary) Chief Complaint Dizziness Reason for Call Symptomatic / Request for [place] states he has been having dizzy spells and his BP 161/103 and 159/102. Translation No Nurse Assessment Nurse: Surprenant, RN, Monique Date/Time (Eastern Time): 01/25/2022 7:03:45 PM Confirm and document reason for call. If symptomatic, describe symptoms. ---Caller states been having dizzy spells last 2 days. Checked bp now and it is 169/103. Does the patient have any new or worsening symptoms? ---Yes Will a triage be completed? ---Yes Related visit to physician within the last 2 weeks? ---No Does the PT have any chronic conditions? (i.e. diabetes, asthma, this includes High risk factors for pregnancy, etc.) ---Yes List chronic conditions. ---HTN - on metoprolol 50mg  and amlodipine 10mg  QD Is this a behavioral health or substance abuse call? ---No Guidelines Guideline Title Affirmed Question Affirmed Notes Nurse Date/Time (Eastern Time) Blood Pressure - High [1] Weakness of the face, arm or leg on one side of the body AND [2] new-onset Surprenant, RN, Monique 01/25/2022 7:08:11 PM Disp. Time Eilene Ghazi Time) Disposition Final User 01/25/2022 7:12:17 PM 911 Outcome Documentation Surprenant, RN, [place] Reason: Caller refused to call 911. He is going to the ED PLEASE NOTE: All timestamps contained  within this report are represented as [place] Standard Time. CONFIDENTIALTY NOTICE: This fax transmission is intended only for the addressee. It contains information that is legally privileged, confidential or otherwise protected from use or disclosure. If you are not the intended recipient, you are strictly prohibited from reviewing, disclosing, copying using or disseminating any of this information or taking any action in reliance on or regarding this information. If you have received this fax in error, please notify us immediately by telephone so that we can arrange for its return to [place]. Phone: [phone], Toll-Free: [phone], Fax: [phone] Page: 2 of 2 Call Id: [id number] 01/25/2022 7:11:15 PM Call EMS 911 Now Yes Surprenant, RN, Beckie Busing Caller Disagree/Comply Comply Caller Understands Yes PreDisposition Did not know what to do Care Advice Given Per Guideline CALL EMS 911 NOW: * Immediate medical attention is needed. You need to hang up and call 911 (or an ambulance). * Triager Discretion: I'll call you back in a few minutes to be sure you were able to reach them. Comments User: Danielle Rankin, RN Date/Time Eilene Ghazi Time): 01/25/2022 7:11:41 PM Caller states that he will go to ED and wife is going to drive him

## 2022-01-27 NOTE — Telephone Encounter (Signed)
Noted will see patient at office visit

## 2022-02-01 ENCOUNTER — Other Ambulatory Visit: Payer: Self-pay

## 2022-02-01 ENCOUNTER — Ambulatory Visit: Payer: BC Managed Care – PPO | Admitting: Nurse Practitioner

## 2022-02-01 ENCOUNTER — Encounter: Payer: Self-pay | Admitting: Nurse Practitioner

## 2022-02-01 VITALS — BP 128/84 | HR 77 | Temp 96.7°F | Resp 12 | Ht 69.0 in | Wt 253.5 lb

## 2022-02-01 DIAGNOSIS — T50905A Adverse effect of unspecified drugs, medicaments and biological substances, initial encounter: Secondary | ICD-10-CM | POA: Diagnosis not present

## 2022-02-01 DIAGNOSIS — I10 Essential (primary) hypertension: Secondary | ICD-10-CM | POA: Diagnosis not present

## 2022-02-01 DIAGNOSIS — T50905D Adverse effect of unspecified drugs, medicaments and biological substances, subsequent encounter: Secondary | ICD-10-CM

## 2022-02-01 DIAGNOSIS — R42 Dizziness and giddiness: Secondary | ICD-10-CM

## 2022-02-01 NOTE — Patient Instructions (Signed)
Nice to see you today Continue taking your medications as prescribed I would recommend you check your blood pressure 3 days a week at varying times of the day. If you want to wean the zoloft and are on 50mg  daily. I would break it in half and take 25mg  daily for a week then stop Follow up as scheduled, sooner if needed

## 2022-02-01 NOTE — Progress Notes (Signed)
Established Patient Office Visit  Subjective:  Patient ID: Jordan Buchanan, male    DOB: [birth year]  Age: 51 y.o. MRN: [id number]  CC:  Chief Complaint  Patient presents with   ER follow up    On HTN and Dizziness-felt better. B/p has been doing better and dizziness resolved.    HPI Jordan Buchanan presents for ED follow up   Patient was seen on 01/25/2022 for lightheadedness and shoulder pain. He called the office and  was directed to the emergency department. He was evaluated and cleared by the ED and here for follow up.  States that he was using an online service and was placed on zoloft 62m and stopped taking it approx 2-3 days prior to the lightheadedness Checks blood pressure after he has an event. If he feels off he will check. States that he has checked it approx every other day before he went  Past Medical History:  Diagnosis Date   Acute calculous cholecystitis s/p lap cholecystectomy 04/30/2020 04/30/2020   Arrhythmia    PALPITATIONS AND PVC - improved with cutting down on caffeine   Cough variant asthma 05/28/2014   05/27/2014 p extensive coaching HFA effectiveness =    75% > try dulera 100 2bid  - PFT's 07/03/2014 FEV1 4.15 (98%) ratio 89 and no chang p B2 and nl fef25-75  And nl dlco     COVID-19 virus infection 11/2020   Diverticulitis of large intestine with perforation and abscess s/p lap drainage & washout 04/30/2020 04/13/2020   Diverticulosis 07/2015   by CT scan   Dysplastic nevus 05/15/2020   Left lat. base of neck. Moderate atypia, lateral margin involved.    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 DEstanislado Pandy   Tremor    Urticaria     Past Surgical History:  Procedure Laterality Date    CHOLECYSTECTOMY N/A 04/30/2020   Procedure: LAPAROSCOPIC CHOLECYSTECTOMY WITH INTRAOPERATIVE CHOLANGIOGRAM, BILATERAL TAP BLOCK;  Surgeon: GMichael Boston MD;  Location: [facility];  Service: General;  Laterality: N/A;   ESOPHAGOGASTRODUODENOSCOPY  08/2018   esophagus dilated, biopsy showed chronic gastritis with mod reflux changes ([place]   LAPAROSCOPY N/A 04/30/2020   Procedure: LAPAROSCOPY DIAGNOSTIC WITH WASHOUT AND DRAINAGE PLACEMENT, DRAINAGE OF DIVERTICULITIS ABSCESS;  Surgeon: GMichael Boston MD;  Location: [facility];  Service: General;  Laterality: N/A;   NASAL RECONSTRUCTION WITH SEPTAL REPAIR  2017   [facility]  OTHER SURGICAL HISTORY  1992   facial surgery   PROCTOSCOPY N/A 06/17/2020   Procedure: RIGID PROCTOSCOPY;  Surgeon: GMichael Boston MD;  Location: [facility];  Service: General;  Laterality: N/A;   ROBOTIC LOW ANTERIOR RECTOSIGMOID RESECTION  06/17/2020   for recurrent L sided diverticulitis ([place])   ROTATOR CUFF REPAIR  2009   left   SINOSCOPY     UMBILICAL HERNIA REPAIR  05/10/2012   Procedure: HERNIA REPAIR UMBILICAL ADULT;  Surgeon: DHarl Bowie MD;  [facility]  Service: General;  Laterality: N/A;    Family History  Problem Relation Age of Onset   Asthma Father    Arthritis Father        father, brother, Pgrandfather   Chronic bronchitis Father  Allergies Father    Hypertension Brother        obese   CAD Paternal Grandfather        MI x2   Multiple sclerosis Brother    Stroke Neg Hx    Cancer Neg Hx     Social History   Socioeconomic History   Marital status: Married    Spouse name: Not on file   Number of children: 2   Years of education: Not on file   Highest education level: Not on file  Occupational History   Occupation: golf course maintenance    Employer: [employer]  Tobacco Use   Smoking status: Former    Packs/day: 1.00    Years: 13.00    Pack years: 13.00    Types: Cigarettes    Quit date: 12/21/1999     Years since quitting: 22.1   Smokeless tobacco: Former    Types: Snuff    Quit date: 12/20/2013  Vaping Use   Vaping Use: Never used  Substance and Sexual Activity   Alcohol use: No    Alcohol/week: 0.0 standard drinks    Comment: quit 2002   Drug use: Not Currently    Types: Marijuana    Comment: quit 2002   Sexual activity: Not on file  Other Topics Concern   Not on file  Social History Narrative   Lives with wife and 2 daughters, 1 dog   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 [employer]   Edu: HS   Activity: work stays active, some bike riding   Diet: good water, fruits/vegetables some   Social Determinants of Radio broadcast assistant Strain: Not on file  Food Insecurity: Not on file  Transportation Needs: Not on file  Physical Activity: Not on file  Stress: Not on file  Social Connections: Not on file  Intimate Partner Violence: Not on file    Outpatient Medications Prior to Visit  Medication Sig Dispense Refill   albuterol (VENTOLIN HFA) 108 (90 Base) MCG/ACT inhaler Inhale 2 puffs into the lungs every 4 (four) hours as needed for wheezing or shortness of breath. 1 each 5   amLODipine (NORVASC) 10 MG tablet TAKE 1 TABLET BY MOUTH EVERY DAY 90 tablet 2   cetirizine (ZYRTEC) 10 MG tablet Take 10 mg by mouth daily.     metoprolol succinate (TOPROL-XL) 50 MG 24 hr tablet TAKE 1 TABLET (50 MG TOTAL) BY MOUTH DAILY. TAKE WITH OR IMMEDIATELY FOLLOWING A MEAL. 90 tablet 3   Multiple Vitamins-Minerals (MULTIVITAMIN MEN PO) Take by mouth daily.     omega-3 acid ethyl esters (LOVAZA) 1 g capsule Take 1 g by mouth daily.     pantoprazole (PROTONIX) 40 MG tablet Take 40 mg by mouth 2 (two) times daily.      sucralfate (CARAFATE) 1 g tablet Take 1 g by mouth 4 (four) times daily. (Patient not taking: Reported on 02/01/2022)     No facility-administered medications prior to visit.    Allergies  Allergen Reactions   Hctz [Hydrochlorothiazide] Rash    Per derm rec stay off HCTZ (2018)     ROS Review of Systems  Constitutional:  Negative for chills and fever.  Eyes:  Negative for visual disturbance.  Respiratory:  Negative for shortness of breath.   Cardiovascular:  Negative for chest pain, palpitations and leg swelling.  Neurological:  Negative for dizziness, light-headedness, numbness and headaches.     Objective:    Physical Exam Vitals and nursing note reviewed.  Constitutional:      Appearance: Normal appearance.  Cardiovascular:     Rate and Rhythm: Normal rate and regular rhythm.     Heart sounds: Normal heart sounds.  Pulmonary:     Effort: Pulmonary effort is normal.     Breath sounds: Normal breath sounds.  Abdominal:     General: Bowel sounds are normal.  Musculoskeletal:     Right lower leg: No edema.     Left lower leg: No edema.  Skin:    General: Skin is warm.     Comments: Bilateral upper and lower extremity strength 5/5  Neurological:     General: No focal deficit present.     Mental Status: He is alert.     Deep Tendon Reflexes:     Reflex Scores:      Bicep reflexes are 2+ on the right side and 2+ on the left side.      Patellar reflexes are 2+ on the right side and 2+ on the left side.    Comments: Bilateral upper and lower extremity strength 5/5. Reflexes wnl    BP 128/84    Pulse 77    Temp (!) 96.7 F (35.9 C)    Resp 12    Ht _0  (1.753 m)    Wt 253 lb 8 oz (115 kg)    SpO2 97%    BMI 37.44 kg/m  Wt Readings from Last 3 Encounters:  02/01/22 253 lb 8 oz (115 kg)  01/25/22 245 lb (111.1 kg)  07/15/21 245 lb (111.1 kg)     Health Maintenance Due  Topic Date Due   COLONOSCOPY (Pts 45-80yr Insurance coverage will need to be confirmed)  Never done   COVID-19 Vaccine (3 - Pfizer risk series) 04/29/2020    There are no preventive care reminders to display for this patient.  Lab Results  Component Value Date   TSH 0.62 02/05/2020   Lab Results  Component Value Date   WBC 10.0 01/25/2022   HGB 15.8 01/25/2022    HCT 45.1 01/25/2022   MCV 85.1 01/25/2022   PLT 293 01/25/2022   Lab Results  Component Value Date   NA 137 01/25/2022   K 3.8 01/25/2022   CO2 24 01/25/2022   GLUCOSE 104 (H) 01/25/2022   BUN 10 01/25/2022   CREATININE 0.97 01/25/2022   BILITOT 1.1 04/29/2021   ALKPHOS 68 04/29/2021   AST 26 04/29/2021   ALT 36 04/29/2021   PROT 6.9 04/29/2021   ALBUMIN 4.2 04/29/2021   CALCIUM 9.5 01/25/2022   ANIONGAP 10 01/25/2022   GFR 90.72 04/29/2021   Lab Results  Component Value Date   CHOL 145 04/29/2021   Lab Results  Component Value Date   HDL 40.40 04/29/2021   Lab Results  Component Value Date   LDLCALC 80 04/29/2021   Lab Results  Component Value Date   TRIG 124.0 04/29/2021   Lab Results  Component Value Date   CHOLHDL 4 04/29/2021   Lab Results  Component Value Date   HGBA1C 5.4 05/04/2021      Assessment & Plan:   Problem List Items Addressed This Visit       Cardiovascular and Mediastinum   Hypertension    Patient currently maintained on amlodipine and metoprolol.  Blood pressure within normal limits in office today.  Patient does have ability to check blood pressure at home.  Recommend he check it 3 times a week for maintenance and anytime that he feels  lightheaded, dizzy, having abnormal headaches.  Continue blood pressure medication as prescribed continue checking blood pressures at home        Other   Episodic lightheadedness - Primary    Was seen in the emergency department on 01/25/2022.  Was evaluated and discharged requested to follow-up PCP.  Patient has not had any more episodes of lightheadedness.  Did encourage patient to check blood pressure on a regular basis and take medication as prescribed.  Follow-up if recurrence of symptoms      Drug reaction    Patient states that the time he did not make a correlation but has been taking Zoloft from an online service for sexual dysfunction.  Patient states that because more issues slightly stop  the medication cold [place] and started developing the symptoms.  Did give advice to patient is if he wants to stop medication and had a taper.  He is currently on 50 mg nightly.  Did tell him to break tablet in half and take 25 mg for 1 week and then discontinue medication thereafter.  He can follow-up with the online service for further guidance if he wants different medication or to continue same medication.  Also follow-up with primary care provider for further assistance in the area       No orders of the defined types were placed in this encounter.   Follow-up: No follow-ups on file.   This visit occurred during the SARS-CoV-2 public health emergency.  Safety protocols were in place, including screening questions prior to the visit, additional usage of staff PPE, and extensive cleaning of exam room while observing appropriate contact time as indicated for disinfecting solutions.   Romilda Garret, NP

## 2022-02-01 NOTE — Assessment & Plan Note (Signed)
Was seen in the emergency department on 01/25/2022.  Was evaluated and discharged requested to follow-up PCP.  Patient has not had any more episodes of lightheadedness.  Did encourage patient to check blood pressure on a regular basis and take medication as prescribed.  Follow-up if recurrence of symptoms

## 2022-02-01 NOTE — Assessment & Plan Note (Signed)
Patient states that the time he did not make a correlation but has been taking Zoloft from an online service for sexual dysfunction.  Patient states that because more issues slightly stop the medication cold [place] and started developing the symptoms.  Did give advice to patient is if he wants to stop medication and had a taper.  He is currently on 50 mg nightly.  Did tell him to break tablet in half and take 25 mg for 1 week and then discontinue medication thereafter.  He can follow-up with the online service for further guidance if he wants different medication or to continue same medication.  Also follow-up with primary care provider for further assistance in the area

## 2022-02-01 NOTE — Assessment & Plan Note (Signed)
Patient currently maintained on amlodipine and metoprolol.  Blood pressure within normal limits in office today.  Patient does have ability to check blood pressure at home.  Recommend he check it 3 times a week for maintenance and anytime that he feels lightheaded, dizzy, having abnormal headaches.  Continue blood pressure medication as prescribed continue checking blood pressures at home

## 2022-04-12 ENCOUNTER — Other Ambulatory Visit: Payer: Self-pay | Admitting: Family Medicine

## 2022-05-03 ENCOUNTER — Other Ambulatory Visit: Payer: Self-pay | Admitting: Family Medicine

## 2022-05-03 ENCOUNTER — Other Ambulatory Visit (INDEPENDENT_AMBULATORY_CARE_PROVIDER_SITE_OTHER): Payer: BC Managed Care – PPO

## 2022-05-03 DIAGNOSIS — I10 Essential (primary) hypertension: Secondary | ICD-10-CM

## 2022-05-03 DIAGNOSIS — M47819 Spondylosis without myelopathy or radiculopathy, site unspecified: Secondary | ICD-10-CM | POA: Diagnosis not present

## 2022-05-03 DIAGNOSIS — K760 Fatty (change of) liver, not elsewhere classified: Secondary | ICD-10-CM | POA: Diagnosis not present

## 2022-05-03 LAB — CBC WITH DIFFERENTIAL/PLATELET
Basophils Absolute: 0 10*3/uL (ref 0.0–0.1)
Basophils Relative: 0.6 % (ref 0.0–3.0)
Eosinophils Absolute: 0.2 10*3/uL (ref 0.0–0.7)
Eosinophils Relative: 2.8 % (ref 0.0–5.0)
HCT: 45 % (ref 39.0–52.0)
Hemoglobin: 15.6 g/dL (ref 13.0–17.0)
Lymphocytes Relative: 31.4 % (ref 12.0–46.0)
Lymphs Abs: 2.3 10*3/uL (ref 0.7–4.0)
MCHC: 34.7 g/dL (ref 30.0–36.0)
MCV: 87.6 fl (ref 78.0–100.0)
Monocytes Absolute: 0.5 10*3/uL (ref 0.1–1.0)
Monocytes Relative: 7.2 % (ref 3.0–12.0)
Neutro Abs: 4.3 10*3/uL (ref 1.4–7.7)
Neutrophils Relative %: 58 % (ref 43.0–77.0)
Platelets: 241 10*3/uL (ref 150.0–400.0)
RBC: 5.14 Mil/uL (ref 4.22–5.81)
RDW: 13.6 % (ref 11.5–15.5)
WBC: 7.4 10*3/uL (ref 4.0–10.5)

## 2022-05-03 LAB — HEPATIC FUNCTION PANEL
ALT: 33 U/L (ref 0–53)
AST: 21 U/L (ref 0–37)
Albumin: 4.1 g/dL (ref 3.5–5.2)
Alkaline Phosphatase: 66 U/L (ref 39–117)
Bilirubin, Direct: 0.1 mg/dL (ref 0.0–0.3)
Total Bilirubin: 0.7 mg/dL (ref 0.2–1.2)
Total Protein: 6.9 g/dL (ref 6.0–8.3)

## 2022-05-03 LAB — BASIC METABOLIC PANEL
BUN: 9 mg/dL (ref 6–23)
CO2: 27 mEq/L (ref 19–32)
Calcium: 9.1 mg/dL (ref 8.4–10.5)
Chloride: 105 mEq/L (ref 96–112)
Creatinine, Ser: 0.86 mg/dL (ref 0.40–1.50)
GFR: 102.38 mL/min (ref >60.00)
Glucose, Bld: 112 mg/dL — ABNORMAL HIGH (ref 70–99)
Potassium: 4.3 mEq/L (ref 3.5–5.1)
Sodium: 138 mEq/L (ref 135–145)

## 2022-05-03 LAB — LIPID PANEL
Cholesterol: 151 mg/dL (ref 0–200)
HDL: 38.9 mg/dL — ABNORMAL LOW (ref >39.00)
LDL Cholesterol: 82 mg/dL (ref 0–99)
NonHDL: 111.79
Total CHOL/HDL Ratio: 4
Triglycerides: 148 mg/dL (ref 0.0–149.0)
VLDL: 29.6 mg/dL (ref 0.0–40.0)

## 2022-05-03 LAB — SEDIMENTATION RATE: Sed Rate: 5 mm/hr (ref 0–15)

## 2022-05-04 ENCOUNTER — Telehealth: Payer: Self-pay | Admitting: Family Medicine

## 2022-05-04 DIAGNOSIS — R1310 Dysphagia, unspecified: Secondary | ICD-10-CM

## 2022-05-04 NOTE — Telephone Encounter (Signed)
Reason for Referral Request: Issues with esophagus  ? ?Has patient been seen PCP for this complaint? ?No ? ?Patient scheduled on: 5.22.2023 ? ?Referral for which specialty: Gastroenterology ? ?Preferred office/provider: [facility]:  [address], [place], [place], [place] [address] ?Phone Number: [phone] ? ?  ?

## 2022-05-04 NOTE — Telephone Encounter (Signed)
Spoke with pt asking about referral request since he's established with [facility].  Says [facility] is more convenient. States he's starting to have issues with swallowing food again and now liquids again.  Thinks it's time to have throat stretched again.  States he has CPE on 05/10/22 but wants to go ahead and get referral started by the time he has that visit.  ? ? ?

## 2022-05-05 DIAGNOSIS — R1310 Dysphagia, unspecified: Secondary | ICD-10-CM | POA: Insufficient documentation

## 2022-05-05 NOTE — Addendum Note (Signed)
Addended by: Ria Bush on: 05/05/2022 07:17 AM ? ? Modules accepted: Orders ? ?

## 2022-05-05 NOTE — Telephone Encounter (Signed)
Referral placed to [facility] for proximity.  ?

## 2022-05-10 ENCOUNTER — Ambulatory Visit (INDEPENDENT_AMBULATORY_CARE_PROVIDER_SITE_OTHER): Payer: BC Managed Care – PPO | Admitting: Family Medicine

## 2022-05-10 ENCOUNTER — Encounter: Payer: Self-pay | Admitting: Family Medicine

## 2022-05-10 VITALS — BP 140/80 | HR 69 | Temp 97.5°F | Ht 69.5 in | Wt 257.0 lb

## 2022-05-10 DIAGNOSIS — Z0000 Encounter for general adult medical examination without abnormal findings: Secondary | ICD-10-CM

## 2022-05-10 DIAGNOSIS — J3089 Other allergic rhinitis: Secondary | ICD-10-CM

## 2022-05-10 DIAGNOSIS — J45991 Cough variant asthma: Secondary | ICD-10-CM

## 2022-05-10 DIAGNOSIS — I10 Essential (primary) hypertension: Secondary | ICD-10-CM

## 2022-05-10 DIAGNOSIS — K219 Gastro-esophageal reflux disease without esophagitis: Secondary | ICD-10-CM | POA: Diagnosis not present

## 2022-05-10 DIAGNOSIS — M47819 Spondylosis without myelopathy or radiculopathy, site unspecified: Secondary | ICD-10-CM

## 2022-05-10 DIAGNOSIS — R053 Chronic cough: Secondary | ICD-10-CM

## 2022-05-10 DIAGNOSIS — R1310 Dysphagia, unspecified: Secondary | ICD-10-CM

## 2022-05-10 MED ORDER — FISH OIL 1000 MG PO CAPS: 1.0000 | ORAL_CAPSULE | Freq: Every day | ORAL | 0 refills | Status: AC

## 2022-05-10 MED ORDER — FAMOTIDINE 20 MG PO TABS: 20.0000 mg | ORAL_TABLET | Freq: Every day | ORAL | Status: DC

## 2022-05-10 MED ORDER — AMLODIPINE BESYLATE 10 MG PO TABS: 10.0000 mg | ORAL_TABLET | Freq: Every day | ORAL | 3 refills | Status: DC

## 2022-05-10 MED ORDER — METOPROLOL SUCCINATE ER 50 MG PO TB24: 50.0000 mg | ORAL_TABLET | Freq: Every day | ORAL | 3 refills | Status: DC

## 2022-05-10 MED ORDER — TURMERIC 500 MG PO CAPS: 2.0000 | ORAL_CAPSULE | Freq: Every day | ORAL | Status: AC

## 2022-05-10 NOTE — Assessment & Plan Note (Signed)
Refer back to GI. 

## 2022-05-10 NOTE — Assessment & Plan Note (Signed)
Chronic, stable. Continue current regimen. 

## 2022-05-10 NOTE — Assessment & Plan Note (Signed)
Stable period on regular antihistamine

## 2022-05-10 NOTE — Assessment & Plan Note (Signed)
Preventative protocols reviewed and updated unless pt declined. Discussed healthy diet and lifestyle.  

## 2022-05-10 NOTE — Assessment & Plan Note (Addendum)
Previously diagnosed by Dr. Ouida Sills rheumatology.  He notes improvement on daily turmeric and fish oil.

## 2022-05-10 NOTE — Progress Notes (Signed)
Patient ID: Jordan Buchanan, male    DOB: [birth year], 51 y.o.   MRN: [id number]  This visit was conducted in person.  BP 140/80   Pulse 69   Temp (!) 97.5 F (36.4 C) (Temporal)   Ht 5' 9.5" (1.765 m)   Wt 257 lb (116.6 kg)   SpO2 94%   BMI 37.41 kg/m    CC: CPE Subjective:   HPI: Jordan Buchanan is a 51 y.o. male presenting on 05/10/2022 for Annual Exam   Prolonged hospitalization summer 2021 for perforated diverticulitis complicated by acute cholecystitis s/p cholecystectomy. He had drain placed for a few weeks after this. Had diverticulitis surgery s/p robotic rectosigmoid resection LAR 05/2020. Continues fiber daily.   [facility]ablished with [facility] Would like to establish with [facility] (more convenient location as he works for [employer]). Referral placed last week.   Recent lightheaded episode - temporally related to stopping sertraline (started by online service for sexual dysfunction).   Weight gain noted.  GERD - occasional breakthrough heartburn despite pantoprazole '40mg'$  bid, manages with PRN carafate.  Notes chronic persistent cough. Wonders of GERD or allergy related. He did double up on antihistamine without much benefit. He also tried extra nexium and pepto bismol yesterday. This morning feeling better. Also notes occasional choking to solids, more to liquids.  Wants reassessment by GI.  H/o esophageal dilation 2019 [facility])  Preventative: Colonoscopy WNL ([facility] Lys[facility]) - done prior to diverticulitis surgery 2021 Lung cancer screen - not eligible  Cardiac history - had coronary calcium score of 0 (06/2019) Flu shot yearly [place] 02/2020, 03/2020, no booster Tdap 2019 Seat belt use discussed Sunscreen use discussed. No changing moles on skin. Note skin tags. Sees derm.  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 Interim medical history since our last visit reviewed. Allergies and medications reviewed and updated. Outpatient Medications Prior to Visit  Medication Sig Dispense Refill   albuterol (VENTOLIN HFA) 108 (90 Base) MCG/ACT inhaler Inhale 2 puffs into the lungs every 4 (four) hours as needed for wheezing or shortness of breath. 1 each 5   cetirizine (ZYRTEC) 10 MG tablet Take 10 mg by mouth daily.     METAMUCIL FIBER PO Take by mouth daily.     Multiple Vitamins-Minerals (MULTIVITAMIN MEN PO) Take by mouth daily.     pantoprazole (PROTONIX) 40 MG tablet Take 40 mg by mouth 2 (two) times daily.      sucralfate (CARAFATE) 1 g tablet Take 1 g by mouth 4 (four) times daily.     amLODipine (NORVASC) 10 MG tablet TAKE 1 TABLET BY MOUTH EVERY DAY 90 tablet 0   metoprolol succinate (TOPROL-XL) 50 MG 24 hr tablet TAKE 1 TABLET (50 MG TOTAL) BY MOUTH DAILY. TAKE WITH OR IMMEDIATELY FOLLOWING A MEAL. 90 tablet 3   MISC NATURAL PRODUCTS PO Take 1,000 mg by mouth daily. Turmeric     omega-3 acid ethyl esters (LOVAZA) 1 g capsule Take 1 g by mouth daily.     No facility-administered medications prior to visit.     Per HPI unless specifically indicated in ROS section below  Review of Systems  Constitutional:  Negative for activity change, appetite change, chills, fatigue, fever and unexpected weight change.  HENT:  Negative for hearing loss.   Eyes:  Negative for visual disturbance.  Respiratory:  Positive for cough. Negative for chest tightness, shortness of breath and wheezing.   Cardiovascular:  Negative for chest pain, palpitations and leg swelling.  Gastrointestinal:  Negative for abdominal distention, abdominal pain, blood in stool, constipation, diarrhea, nausea and vomiting.  Genitourinary:  Negative for  difficulty urinating and hematuria.  Musculoskeletal:  Negative for arthralgias, myalgias and neck pain.  Skin:  Negative for rash.  Neurological:  Negative for dizziness, seizures, syncope and headaches.  Hematological:  Negative for adenopathy. Does not bruise/bleed easily.  Psychiatric/Behavioral:  Negative for dysphoric mood. The patient is not nervous/anxious.    Objective:  BP 140/80   Pulse 69   Temp (!) 97.5 F (36.4 C) (Temporal)   Ht 5' 9.5" (1.765 m)   Wt 257 lb (116.6 kg)   SpO2 94%   BMI 37.41 kg/m   Wt Readings from Last 3 Encounters:  05/10/22 257 lb (116.6 kg)  02/01/22 253 lb 8 oz (115 kg)  01/25/22 245 lb (111.1 kg)      Physical Exam Vitals and nursing note reviewed.  Constitutional:      General: He is not in acute distress.    Appearance: Normal appearance. He is well-developed. He is not ill-appearing.  HENT:     Head: Normocephalic and atraumatic.     Right Ear: Hearing, tympanic membrane, ear canal and external ear normal.     Left Ear: Hearing, tympanic membrane, ear canal and external ear normal.  Eyes:     General: No scleral icterus.    Extraocular Movements: Extraocular movements intact.     Conjunctiva/sclera: Conjunctivae normal.     Pupils: Pupils are equal, round, and reactive to light.  Neck:     Thyroid: No thyroid mass or thyromegaly.  Cardiovascular:     Rate and Rhythm: Normal rate and regular rhythm.     Pulses: Normal pulses.          Radial pulses are 2+ on the right side and 2+ on the left side.     Heart sounds: Normal heart sounds. No murmur heard. Pulmonary:     Effort: Pulmonary effort is normal. No respiratory distress.     Breath sounds: Normal breath sounds. No wheezing, rhonchi or rales.  Abdominal:     General: Bowel sounds are normal. There is no distension.     Palpations: Abdomen is soft. There is no mass.     Tenderness: There is no abdominal tenderness. There is no guarding or rebound.     Hernia: No hernia is  present.  Musculoskeletal:        General: Normal range of motion.     Cervical back: Normal range of motion and neck supple.     Right lower leg: No edema.     Left lower leg: No edema.  Lymphadenopathy:     Cervical: No cervical adenopathy.  Skin:    General: Skin is warm and dry.     Findings: No rash.  Neurological:     General: No focal deficit present.     Mental Status: He is alert and oriented to person, place, and time.  Psychiatric:        Mood and Affect: Mood normal.        Behavior: Behavior normal.  Thought Content: Thought content normal.        Judgment: Judgment normal.      Results for orders placed or performed in visit on 05/03/22  Hepatic function panel  Result Value Ref Range   Total Bilirubin 0.7 0.2 - 1.2 mg/dL   Bilirubin, Direct 0.1 0.0 - 0.3 mg/dL   Alkaline Phosphatase 66 39 - 117 U/L   AST 21 0 - 37 U/L   ALT 33 0 - 53 U/L   Total Protein 6.9 6.0 - 8.3 g/dL   Albumin 4.1 3.5 - 5.2 g/dL  Basic metabolic panel  Result Value Ref Range   Sodium 138 135 - 145 mEq/L   Potassium 4.3 3.5 - 5.1 mEq/L   Chloride 105 96 - 112 mEq/L   CO2 27 19 - 32 mEq/L   Glucose, Bld 112 (H) 70 - 99 mg/dL   BUN 9 6 - 23 mg/dL   Creatinine, Ser 0.86 0.40 - 1.50 mg/dL   GFR 102.38 >60.00 mL/min   Calcium 9.1 8.4 - 10.5 mg/dL  Sedimentation rate  Result Value Ref Range   Sed Rate 5 0 - 15 mm/hr  CBC with Differential/Platelet  Result Value Ref Range   WBC 7.4 4.0 - 10.5 K/uL   RBC 5.14 4.22 - 5.81 Mil/uL   Hemoglobin 15.6 13.0 - 17.0 g/dL   HCT 45.0 39.0 - 52.0 %   MCV 87.6 78.0 - 100.0 fl   MCHC 34.7 30.0 - 36.0 g/dL   RDW 13.6 11.5 - 15.5 %   Platelets 241.0 150.0 - 400.0 K/uL   Neutrophils Relative % 58.0 43.0 - 77.0 %   Lymphocytes Relative 31.4 12.0 - 46.0 %   Monocytes Relative 7.2 3.0 - 12.0 %   Eosinophils Relative 2.8 0.0 - 5.0 %   Basophils Relative 0.6 0.0 - 3.0 %   Neutro Abs 4.3 1.4 - 7.7 K/uL   Lymphs Abs 2.3 0.7 - 4.0 K/uL   Monocytes  Absolute 0.5 0.1 - 1.0 K/uL   Eosinophils Absolute 0.2 0.0 - 0.7 K/uL   Basophils Absolute 0.0 0.0 - 0.1 K/uL  Lipid panel  Result Value Ref Range   Cholesterol 151 0 - 200 mg/dL   Triglycerides 148.0 0.0 - 149.0 mg/dL   HDL 38.90 (L) >39.00 mg/dL   VLDL 29.6 0.0 - 40.0 mg/dL   LDL Cholesterol 82 0 - 99 mg/dL   Total CHOL/HDL Ratio 4    NonHDL 111.79    Assessment & Plan:   Problem List Items Addressed This Visit     Health maintenance examination - Primary (Chronic)    Preventative protocols reviewed and updated unless pt declined. Discussed healthy diet and lifestyle.        Hypertension    Chronic, stable. Continue current regimen.        Relevant Medications   metoprolol succinate (TOPROL-XL) 50 MG 24 hr tablet   amLODipine (NORVASC) 10 MG tablet   Perennial allergic rhinitis    Stable period on regular antihistamine       GERD (gastroesophageal reflux disease)    Chronic, suspect breakthrough symptoms manifesting as irritant cough despite regular twice daily PPI.  Recommend add Pepcid at nighttime. Notes some intermittent choking to solids and liquids, history of esophageal stricture dilated 2019.  Will refer back to GI for further evaluation.  He requests establishing with [facility] given proximity to work.       Relevant Medications   METAMUCIL FIBER PO   famotidine (PEPCID)  20 MG tablet   Cough variant asthma     History of this, previously more responsive to anti-GERD measures than to prednisone and inhalers.       Cough, persistent   Severe obesity (BMI 35.0-39.9) with comorbidity ([place])    Discussed weight gain noted, encourage healthy diet and lifestyle changes for sustainable weight loss.       Spondyloarthropathy    Previously diagnosed by Dr. Ouida Sills rheumatology.  He notes improvement on daily turmeric and fish oil.       Dysphagia    Refer back to GI.         Meds ordered this encounter  Medications   metoprolol succinate  (TOPROL-XL) 50 MG 24 hr tablet    Sig: Take 1 tablet (50 mg total) by mouth daily. Take with or immediately following a meal.    Dispense:  90 tablet    Refill:  3   famotidine (PEPCID) 20 MG tablet    Sig: Take 1 tablet (20 mg total) by mouth at bedtime.   amLODipine (NORVASC) 10 MG tablet    Sig: Take 1 tablet (10 mg total) by mouth daily.    Dispense:  90 tablet    Refill:  3   Turmeric 500 MG CAPS    Sig: Take 2 capsules by mouth daily.   Omega-3 Fatty Acids (FISH OIL) 1000 MG CAPS    Sig: Take 1 capsule (1,000 mg total) by mouth daily.    Refill:  0   No orders of the defined types were placed in this encounter.   Patient instructions: You may call [facility] to schedule an appointment at [phone].   Try Pepcid (famotidine) '20mg'$  at night time in addition to your pantoprazole.  Work on regular exercises routine for goal weight loss.  Good to see you today. Return as needed or in 1 year for next physical.   Follow up plan: Return in about 1 year (around 05/11/2023) for annual exam, prior fasting for blood work.  Ria Bush, MD

## 2022-05-10 NOTE — Assessment & Plan Note (Signed)
Chronic, suspect breakthrough symptoms manifesting as irritant cough despite regular twice daily PPI.  Recommend add Pepcid at nighttime. Notes some intermittent choking to solids and liquids, history of esophageal stricture dilated 2019.  Will refer back to GI for further evaluation.  He requests establishing with [facility] given proximity to work.

## 2022-05-10 NOTE — Assessment & Plan Note (Signed)
Discussed weight gain noted, encourage healthy diet and lifestyle changes for sustainable weight loss.

## 2022-05-10 NOTE — Patient Instructions (Addendum)
You may call [facility] to schedule an appointment at [phone].   Try Pepcid (famotidine) '20mg'$  at night time in addition to your pantoprazole.  Work on regular exercises routine for goal weight loss.  Good to see you today. Return as needed or in 1 year for next physical.   Health Maintenance, Male Adopting a healthy lifestyle and getting preventive care are important in promoting health and wellness. Ask your health care provider about: The right schedule for you to have regular tests and exams. Things you can do on your own to prevent diseases and keep yourself healthy. What should I know about diet, weight, and exercise? Eat a healthy diet  Eat a diet that includes plenty of vegetables, fruits, low-fat dairy products, and lean protein. Do not eat a lot of foods that are high in solid fats, added sugars, or sodium. Maintain a healthy weight Body mass index (BMI) is a measurement that can be used to identify possible weight problems. It estimates body fat based on height and weight. Your health care provider can help determine your BMI and help you achieve or maintain a healthy weight. Get regular exercise Get regular exercise. This is one of the most important things you can do for your health. Most adults should: Exercise for at least 150 minutes each week. The exercise should increase your heart rate and make you sweat (moderate-intensity exercise). Do strengthening exercises at least twice a week. This is in addition to the moderate-intensity exercise. Spend less time sitting. Even light physical activity can be beneficial. Watch cholesterol and blood lipids Have your blood tested for lipids and cholesterol at 49 years of age, then have this test every 5 years. You may need to have your cholesterol levels checked more often if: Your lipid or cholesterol levels are high. You are older than 49 years of age. You are at high risk for heart disease. What should I know about cancer  screening? Many types of cancers can be detected early and may often be prevented. Depending on your health history and family history, you may need to have cancer screening at various ages. This may include screening for: Colorectal cancer. Prostate cancer. Skin cancer. Lung cancer. What should I know about heart disease, diabetes, and high blood pressure? Blood pressure and heart disease High blood pressure causes heart disease and increases the risk of stroke. This is more likely to develop in people who have high blood pressure readings or are overweight. Talk with your health care provider about your target blood pressure readings. Have your blood pressure checked: Every 3-5 years if you are 36-33 years of age. Every year if you are 39 years old or older. If you are between the ages of 22 and 17 and are a current or former smoker, ask your health care provider if you should have a one-time screening for abdominal aortic aneurysm (AAA). Diabetes Have regular diabetes screenings. This checks your fasting blood sugar level. Have the screening done: Once every three years after age 16 if you are at a normal weight and have a low risk for diabetes. More often and at a younger age if you are overweight or have a high risk for diabetes. What should I know about preventing infection? Hepatitis B If you have a higher risk for hepatitis B, you should be screened for this virus. Talk with your health care provider to find out if you are at risk for hepatitis B infection. Hepatitis C Blood testing is recommended  for: Everyone born from [birth year] through 1963. Anyone with known risk factors for hepatitis C. Sexually transmitted infections (STIs) You should be screened each year for STIs, including gonorrhea and chlamydia, if: You are sexually active and are younger than 49 years of age. You are older than 49 years of age and your health care provider tells you that you are at risk for this type of  infection. Your sexual activity has changed since you were last screened, and you are at increased risk for chlamydia or gonorrhea. Ask your health care provider if you are at risk. Ask your health care provider about whether you are at high risk for HIV. Your health care provider may recommend a prescription medicine to help prevent HIV infection. If you choose to take medicine to prevent HIV, you should first get tested for HIV. You should then be tested every 3 months for as long as you are taking the medicine. Follow these instructions at home: Alcohol use Do not drink alcohol if your health care provider tells you not to drink. If you drink alcohol: Limit how much you have to 0-2 drinks a day. Know how much alcohol is in your drink. In the [place] one drink equals one 12 oz bottle of beer (355 mL), one 5 oz glass of wine (148 mL), or one 1 oz glass of hard liquor (44 mL). Lifestyle Do not use any products that contain nicotine or tobacco. These products include cigarettes, chewing tobacco, and vaping devices, such as e-cigarettes. If you need help quitting, ask your health care provider. Do not use street drugs. Do not share needles. Ask your health care provider for help if you need support or information about quitting drugs. General instructions Schedule regular health, dental, and eye exams. Stay current with your vaccines. Tell your health care provider if: You often feel depressed. You have ever been abused or do not feel safe at home. Summary Adopting a healthy lifestyle and getting preventive care are important in promoting health and wellness. Follow your health care provider's instructions about healthy diet, exercising, and getting tested or screened for diseases. Follow your health care provider's instructions on monitoring your cholesterol and blood pressure. This information is not intended to replace advice given to you by your health care provider. Make sure you discuss  any questions you have with your health care provider. Document Revised: 04/27/2021 Document Reviewed: 04/27/2021 Elsevier Patient Education  [place].

## 2022-05-10 NOTE — Addendum Note (Signed)
Addended by: Ria Bush on: 05/10/2022 09:11 AM   Modules accepted: Orders

## 2022-05-10 NOTE — Assessment & Plan Note (Signed)
History of this, previously more responsive to anti-GERD measures than to prednisone and inhalers.

## 2022-05-10 NOTE — Telephone Encounter (Signed)
Spoke with pt at [facility] today - he requests [facility] referral instead of [facility].

## 2022-05-21 ENCOUNTER — Telehealth: Payer: Self-pay | Admitting: Gastroenterology

## 2022-05-21 NOTE — Telephone Encounter (Signed)
Hi Dr. Silverio Decamp,  D.O.D   We received a referral for the patient be evaluated  for Dysphagia. The patent has history with [facility] seeking a  transfer of care due to location states [place] is too far for him. Records are available via Epic for review.  Please advise on scheduling. Thanks

## 2022-05-24 ENCOUNTER — Telehealth: Payer: Self-pay | Admitting: Gastroenterology

## 2022-05-24 NOTE — Telephone Encounter (Signed)
Error

## 2022-05-31 NOTE — Telephone Encounter (Signed)
Patient was scheduled for 7/6 at 8:30 with Alonza Bogus.

## 2022-05-31 NOTE — Telephone Encounter (Signed)
Please schedule next available appointment.

## 2022-06-18 ENCOUNTER — Encounter: Payer: Self-pay | Admitting: Family Medicine

## 2022-06-24 ENCOUNTER — Ambulatory Visit: Payer: BC Managed Care – PPO | Admitting: Gastroenterology

## 2022-07-27 ENCOUNTER — Ambulatory Visit: Payer: BC Managed Care – PPO | Admitting: Family Medicine

## 2022-08-30 ENCOUNTER — Ambulatory Visit: Payer: BC Managed Care – PPO | Admitting: Dermatology

## 2022-08-30 ENCOUNTER — Encounter: Payer: BC Managed Care – PPO | Admitting: Dermatology

## 2022-08-30 DIAGNOSIS — D239 Other benign neoplasm of skin, unspecified: Secondary | ICD-10-CM

## 2022-08-30 DIAGNOSIS — L578 Other skin changes due to chronic exposure to nonionizing radiation: Secondary | ICD-10-CM

## 2022-08-30 DIAGNOSIS — L820 Inflamed seborrheic keratosis: Secondary | ICD-10-CM

## 2022-08-30 DIAGNOSIS — L821 Other seborrheic keratosis: Secondary | ICD-10-CM

## 2022-08-30 DIAGNOSIS — L814 Other melanin hyperpigmentation: Secondary | ICD-10-CM | POA: Diagnosis not present

## 2022-08-30 DIAGNOSIS — Z86018 Personal history of other benign neoplasm: Secondary | ICD-10-CM | POA: Diagnosis not present

## 2022-08-30 DIAGNOSIS — D229 Melanocytic nevi, unspecified: Secondary | ICD-10-CM

## 2022-08-30 DIAGNOSIS — Z1283 Encounter for screening for malignant neoplasm of skin: Secondary | ICD-10-CM | POA: Diagnosis not present

## 2022-08-30 DIAGNOSIS — L720 Epidermal cyst: Secondary | ICD-10-CM | POA: Diagnosis not present

## 2022-08-30 DIAGNOSIS — D1800 Hemangioma unspecified site: Secondary | ICD-10-CM

## 2022-08-30 NOTE — Patient Instructions (Addendum)
Cryotherapy Aftercare  Wash gently with soap and water everyday.   Apply Vaseline and Band-Aid daily until healed.     PRE-OPERATIVE INSTRUCTIONS  We recommend you read the following instructions. If you have any questions or concerns, please call the office at [phone].  Shower and wash the entire body with soap and water the day of your surgery paying special attention to cleansing at and around the planned surgery site. Avoid aspirin or aspirin containing products at least ten (10) days prior to your surgical procedure and for at least one week (7 days) after your surgical procedure. If you take aspirin on a regular basis for heart disease or history of stroke or for any other reason, we may recommend you continue taking aspirin but please notify us if you take this on a regular basis. Aspirin can cause more bleeding to occur during surgery as well as prolonged bleeding and bruising after surgery. Avoid other nonsteroidal pain medications at least one week prior to surgery and at least one week after your surgery. These include medications such as Ibuprofen (Motrin, Advil and Nuprin), Naprosyn, Voltaren, Relafen, etc. If these medications are used for therapeutic reasons, please inform us as they can cause increased bleeding or prolonged bleeding during and bruising after surgical procedures.  Please advise [place] if you are taking any "blood thinner" medications such as Coumadin or Dipyridamole or Plavix or similar medications. These cause increased bleeding and prolonged bleeding during procedures and bruising after surgical procedures. We may have to consider discontinuing these medications briefly prior to and shortly after your surgery if safe to do so. Please inform us of all medications you are currently taking. All medications that are taken regularly should be taken the day of surgery as you always do. Nevertheless, we need to be informed of what medications you are taking prior to surgery  to know whether they will affect the procedure or cause any complications. Please inform us of any medication allergies. Also inform us of whether you have allergies to Latex or rubber products or whether you have had any adverse reaction to Lidocaine or Epinephrine. Please inform us of any prosthetic or artificial body parts such as artificial heart valve, joint replacements, etc., or similar condition that might require preoperative antibiotics. We recommend avoidance of alcohol at least two weeks prior to surgery and continued avoidance for at least two weeks after surgery. We recommend avoidance of tobacco smoking at least two weeks prior to surgery and continued abstinence for at least two weeks after surgery. Do not plan strenuous exercise, strenuous work or strenuous lifting for approximately four weeks after your surgery. We request if you are unable to make your scheduled surgical appointment, please call us at least a week in advance or as soon as you are aware of a problem so that we can cancel or reschedule the appointment. You MAY TAKE TYLENOL (acetaminophen) for pain as it is not a blood thinner. PLEASE PLAN TO BE IN TOWN FOR TWO WEEKS FOLLOWING SURGERY. THIS IS IMPORTANT SO YOU CAN BE CHECKED FOR DRESSING CHANGES, SUTURE REMOVAL AND TO MONITOR FOR POSSIBLE COMPLICATIONS.  Due to recent changes in healthcare laws, you may see results of your pathology and/or laboratory studies on MyChart before the doctors have had a chance to review them. We understand that in some cases there may be results that are confusing or concerning to you. Please understand that not all results are received at the same time and often the doctors may need  to interpret multiple results in order to provide you with the best plan of care or course of treatment. Therefore, we ask that you please give [place] 2 business days to thoroughly review all your results before contacting the office for clarification. Should we see a  critical lab result, you will be contacted sooner.   If You Need Anything After Your Visit  If you have any questions or concerns for your doctor, please call our main line at [phone] and press option 4 to reach your doctor's medical assistant. If no one answers, please leave a voicemail as directed and we will return your call as soon as possible. Messages left after 4 pm will be answered the following business day.   You may also send [place] a message via [place]. We typically respond to MyChart messages within 1-2 business days.  For prescription refills, please ask your pharmacy to contact our office. Our fax number is [phone].  If you have an urgent issue when the clinic is closed that cannot wait until the next business day, you can page your doctor at the number below.    Please note that while we do our best to be available for urgent issues outside of office hours, we are not available 24/7.   If you have an urgent issue and are unable to reach [place], you may choose to seek medical care at your doctor's office, retail clinic, urgent care center, or emergency room.  If you have a medical emergency, please immediately call 911 or go to the emergency department.  Pager Numbers  - Dr. Nehemiah Massed: [phone]  - Dr. Laurence Ferrari: [phone]  - Dr. Nicole Kindred: [phone]  In the event of inclement weather, please call our main line at [phone] for an update on the status of any delays or closures.  Dermatology Medication Tips: Please keep the boxes that topical medications come in in order to help keep track of the instructions about where and how to use these. Pharmacies typically print the medication instructions only on the boxes and not directly on the medication tubes.   If your medication is too expensive, please contact our office at [phone] option 4 or send [place] a message through [place].   We are unable to tell what your co-pay for medications will be in advance as  this is different depending on your insurance coverage. However, we may be able to find a substitute medication at lower cost or fill out paperwork to get insurance to cover a needed medication.   If a prior authorization is required to get your medication covered by your insurance company, please allow [place] 1-2 business days to complete this process.  Drug prices often vary depending on where the prescription is filled and some pharmacies may offer cheaper prices.  The website www.goodrx.com contains coupons for medications through different pharmacies. The prices here do not account for what the cost may be with help from insurance (it may be cheaper with your insurance), but the website can give you the price if you did not use any insurance.  - You can print the associated coupon and take it with your prescription to the pharmacy.  - You may also stop by our office during regular business hours and pick up a GoodRx coupon card.  - If you need your prescription sent electronically to a different pharmacy, notify our office through [facility] or by phone at [phone] option 4.     Si Usted Necesita Algo Despus de Su  Visita  Tambin puede enviarnos un mensaje a travs de MyChart. Por lo general respondemos a los mensajes de MyChart en el transcurso de 1 a 2 das hbiles.  Para renovar recetas, por favor pida a su farmacia que se ponga en contacto con nuestra oficina. Harland Dingwall de fax es [place] [phone].  Si tiene un asunto urgente cuando la clnica est cerrada y que no puede esperar hasta el siguiente da hbil, puede llamar/localizar a su doctor(a) al nmero que aparece a continuacin.   Por favor, tenga en cuenta que aunque hacemos todo lo posible para estar disponibles para asuntos urgentes fuera del horario de [place], no estamos disponibles las 24 horas del da, los 7 das de la [place].   Si tiene un problema urgente y no puede comunicarse con nosotros, puede optar por  buscar atencin mdica  en el consultorio de su doctor(a), en una clnica privada, en un centro de atencin urgente o en una sala de emergencias.  Si tiene Engineering geologist, por favor llame inmediatamente al 911 o vaya a la sala de emergencias.  Nmeros de bper  - Dr. Nehemiah Massed: [phone]  - Dra. Moye: [phone]  - Dra. Nicole Kindred: [phone]  En caso de inclemencias del [place], por favor llame a Johnsie Kindred principal al [phone] para una actualizacin sobre el [place] de cualquier retraso o cierre.  Consejos para la medicacin en dermatologa: Por favor, guarde las cajas en las que vienen los medicamentos de uso tpico para ayudarle a seguir las instrucciones sobre dnde y cmo usarlos. Las farmacias generalmente imprimen las instrucciones del medicamento slo en las cajas y no directamente en los tubos del [place].   Si su medicamento es muy caro, por favor, pngase en contacto con Zigmund Daniel llamando al [phone] y presione la opcin 4 o envenos un mensaje a travs de Pharmacist, community.   No podemos decirle cul ser su copago por los medicamentos por adelantado ya que esto es diferente dependiendo de la cobertura de su seguro. Sin embargo, es posible que podamos encontrar un medicamento sustituto a Electrical engineer un formulario para que el seguro cubra el medicamento que se considera necesario.   Si se requiere una autorizacin previa para que su compaa de seguros [place] su medicamento, por favor permtanos de 1 a 2 das hbiles para completar este proceso.  Los precios de los medicamentos varan con frecuencia dependiendo del Environmental consultant de dnde se surte la receta y alguna farmacias pueden ofrecer precios ms baratos.  El sitio web www.goodrx.com tiene cupones para medicamentos de Airline pilot. Los precios aqu no tienen en cuenta lo que podra costar con la ayuda del seguro (puede ser ms barato con su seguro), pero el sitio web puede darle el precio si no  utiliz Research scientist (physical sciences).  - Puede imprimir el cupn correspondiente y llevarlo con su receta a la farmacia.  - Tambin puede pasar por nuestra oficina durante el horario de atencin regular y Charity fundraiser una tarjeta de cupones de GoodRx.  - Si necesita que su receta se enve electrnicamente a una farmacia diferente, informe a nuestra oficina a travs de MyChart de [place] o por telfono llamando al [phone] y presione la opcin 4.

## 2022-08-30 NOTE — Progress Notes (Unsigned)
Follow-Up Visit   Subjective  Jordan Buchanan is a 51 y.o. male who presents for the following: Annual Exam (History of dysplastic nevus - The patient presents for Total-Body Skin Exam (TBSE) for skin cancer screening and mole check.  The patient has spots, moles and lesions to be evaluated, some may be new or changing and the patient has concerns that these could be cancer./).  The following portions of the chart were reviewed this encounter and updated as appropriate:   Tobacco  Allergies  Meds  Problems  Med Hx  Surg Hx  Fam Hx     Review of Systems:  No other skin or systemic complaints except as noted in HPI or Assessment and Plan.  Objective  Well appearing patient in no apparent distress; mood and affect are within normal limits.  A full examination was performed including scalp, head, eyes, ears, nose, lips, neck, chest, axillae, abdomen, back, buttocks, bilateral upper extremities, bilateral lower extremities, hands, feet, fingers, toes, fingernails, and toenails. All findings within normal limits unless otherwise noted below.  Left Preauricular Area, left post neck at hairline Subcutaneous nodules  Left Flank Erythematous stuck-on, waxy papule or plaque   Assessment & Plan   History of Dysplastic Nevi - No evidence of recurrence today - Recommend regular full body skin exams - Recommend daily broad spectrum sunscreen SPF 30+ to sun-exposed areas, reapply every 2 hours as needed.  - Call if any new or changing lesions are noted between office visits  Lentigines - Scattered tan macules - Due to sun exposure - Benign-appearing, observe - Recommend daily broad spectrum sunscreen SPF 30+ to sun-exposed areas, reapply every 2 hours as needed. - Call for any changes  Seborrheic Keratoses - Stuck-on, waxy, tan-brown papules and/or plaques  - Benign-appearing - Discussed benign etiology and prognosis. - Observe - Call for any changes  Melanocytic Nevi -  Tan-brown and/or pink-flesh-colored symmetric macules and papules - Benign appearing on exam today - Observation - Call clinic for new or changing moles - Recommend daily use of broad spectrum spf 30+ sunscreen to sun-exposed areas.   Hemangiomas - Red papules - Discussed benign nature - Observe - Call for any changes  Actinic Damage - Chronic condition, secondary to cumulative UV/sun exposure - diffuse scaly erythematous macules with underlying dyspigmentation - Recommend daily broad spectrum sunscreen SPF 30+ to sun-exposed areas, reapply every 2 hours as needed.  - Staying in the shade or wearing long sleeves, sun glasses (UVA+UVB protection) and wide brim hats (4-inch brim around the entire circumference of the hat) are also recommended for sun protection.  - Call for new or changing lesions.  Skin cancer screening performed today.  Epidermal inclusion cyst Left Preauricular Area, left post neck at hairline Benign-appearing. Exam most consistent with an epidermal inclusion cyst. Discussed that a cyst is a benign growth that can grow over time and sometimes get irritated or inflamed. Recommend observation if it is not bothersome. Discussed option of surgical excision to remove it if it is growing, symptomatic, or other changes noted. Please call for new or changing lesions so they can be evaluated.  Discussed excision. Patient may consider in the future.  Inflamed seborrheic keratosis Left Flank Symptomatic, irritating, patient would like treated. Destruction of lesion - Left Flank Complexity: simple   Destruction method: cryotherapy   Informed consent: discussed and consent obtained   Timeout:  patient name, date of birth, surgical site, and procedure verified Lesion destroyed using liquid nitrogen: Yes  Region frozen until ice ball extended beyond lesion: Yes   Outcome: patient tolerated procedure well with no complications   Post-procedure details: wound care instructions  given    Return for Surgery  left post neck, left preauricular - 2 appointments 1 week apart.  I, Ashok Cordia, CMA, am acting as scribe for Sarina Ser, MD . Documentation: I have reviewed the above documentation for accuracy and completeness, and I agree with the above.  Sarina Ser, MD

## 2022-08-31 ENCOUNTER — Encounter: Payer: Self-pay | Admitting: Dermatology

## 2022-09-16 DIAGNOSIS — M9901 Segmental and somatic dysfunction of cervical region: Secondary | ICD-10-CM | POA: Diagnosis not present

## 2022-09-16 DIAGNOSIS — Q72812 Congenital shortening of left lower limb: Secondary | ICD-10-CM | POA: Diagnosis not present

## 2022-09-16 DIAGNOSIS — M5382 Other specified dorsopathies, cervical region: Secondary | ICD-10-CM | POA: Diagnosis not present

## 2022-09-16 DIAGNOSIS — M9905 Segmental and somatic dysfunction of pelvic region: Secondary | ICD-10-CM | POA: Diagnosis not present

## 2022-09-20 DIAGNOSIS — H5213 Myopia, bilateral: Secondary | ICD-10-CM | POA: Diagnosis not present

## 2022-09-20 DIAGNOSIS — E119 Type 2 diabetes mellitus without complications: Secondary | ICD-10-CM | POA: Diagnosis not present

## 2022-09-20 DIAGNOSIS — H524 Presbyopia: Secondary | ICD-10-CM | POA: Diagnosis not present

## 2022-09-20 LAB — HM DIABETES EYE EXAM

## 2022-09-21 ENCOUNTER — Telehealth: Payer: Self-pay

## 2022-09-21 ENCOUNTER — Encounter: Payer: Self-pay | Admitting: Family Medicine

## 2022-09-21 ENCOUNTER — Ambulatory Visit (INDEPENDENT_AMBULATORY_CARE_PROVIDER_SITE_OTHER): Payer: BC Managed Care – PPO | Admitting: Dermatology

## 2022-09-21 DIAGNOSIS — L720 Epidermal cyst: Secondary | ICD-10-CM | POA: Diagnosis not present

## 2022-09-21 DIAGNOSIS — D492 Neoplasm of unspecified behavior of bone, soft tissue, and skin: Secondary | ICD-10-CM

## 2022-09-21 MED ORDER — MUPIROCIN 2 % EX OINT: 1.0000 | TOPICAL_OINTMENT | Freq: Every day | CUTANEOUS | 0 refills | Status: DC

## 2022-09-21 NOTE — Patient Instructions (Signed)

## 2022-09-21 NOTE — Progress Notes (Unsigned)
   Follow-Up Visit   Subjective  Jordan Buchanan is a 51 y.o. male who presents for the following: Cyst (L post neck at hairline, pt presents for excision today).  The following portions of the chart were reviewed this encounter and updated as appropriate:   Tobacco  Allergies  Meds  Problems  Med Hx  Surg Hx  Fam Hx     Review of Systems:  No other skin or systemic complaints except as noted in HPI or Assessment and Plan.  Objective  Well appearing patient in no apparent distress; mood and affect are within normal limits.  A focused examination was performed including neck. Relevant physical exam findings are noted in the Assessment and Plan.  L post neck at hairline Cystic papule 1.2 cm   Assessment & Plan  Neoplasm of skin - suspected symptomatic changing cyst L post neck at hairline  Skin excision  Lesion length (cm):  1.2 Lesion width (cm):  1.2 Margin per side (cm):  0 Total excision diameter (cm):  1.2 Informed consent: discussed and consent obtained   Timeout: patient name, date of birth, surgical site, and procedure verified   Procedure prep:  Patient was prepped and draped in usual sterile fashion Prep type:  Isopropyl alcohol and povidone-iodine Anesthesia: the lesion was anesthetized in a standard fashion   Anesthetic:  1% lidocaine w/ epinephrine 1-100,000 buffered w/ 8.4% NaHCO3 Instrument used: #15 blade   Hemostasis achieved with: pressure   Hemostasis achieved with comment:  Electrocautery Outcome: patient tolerated procedure well with no complications   Post-procedure details: sterile dressing applied and wound care instructions given   Dressing type: bandage, pressure dressing and bacitracin (Mupirocin)    Skin repair Complexity:  Complex ; closure 3 cm Reason for type of repair: reduce tension to allow closure, reduce the risk of dehiscence, infection, and necrosis, reduce subcutaneous dead space and avoid a hematoma, allow closure of the large  defect, preserve normal anatomy, preserve normal anatomical and functional relationships and enhance both functionality and cosmetic results   Undermining: area extensively undermined   Undermining comment:  Undermining Defect 1.2 cm Subcutaneous layers (deep stitches):  Suture size:  4-0 Suture type: Vicryl (polyglactin 910)   Subcutaneous suture technique: Inverted Dermal. Fine/surface layer approximation (top stitches):  Suture size:  4-0 Stitches: horizontal mattress   Stitches comment:  X 3 Suture removal (days):  7 Hemostasis achieved with: pressure Outcome: patient tolerated procedure well with no complications   Post-procedure details: sterile dressing applied and wound care instructions given   Dressing type: bandage, pressure dressing and bacitracin (Mupirocin)    mupirocin ointment (BACTROBAN) 2 % Apply 1 Application topically daily. With dressing changes  Specimen 1 - Surgical pathology Differential Diagnosis: D48.5 Cyst vs other  Check Margins: No Cystic pap  Return in about 1 week (around 09/28/2022) for suture removal.  I, Ashok Cordia, CMA, am acting as scribe for Sarina Ser, MD . Documentation: I have reviewed the above documentation for accuracy and completeness, and I agree with the above.  Sarina Ser, MD

## 2022-09-21 NOTE — Telephone Encounter (Signed)
Spoke with patient regarding surgery. He is doing fine/hd  

## 2022-09-22 ENCOUNTER — Encounter: Payer: Self-pay | Admitting: Dermatology

## 2022-09-22 DIAGNOSIS — M9901 Segmental and somatic dysfunction of cervical region: Secondary | ICD-10-CM | POA: Diagnosis not present

## 2022-09-23 DIAGNOSIS — M9901 Segmental and somatic dysfunction of cervical region: Secondary | ICD-10-CM | POA: Diagnosis not present

## 2022-09-27 DIAGNOSIS — M9901 Segmental and somatic dysfunction of cervical region: Secondary | ICD-10-CM | POA: Diagnosis not present

## 2022-09-28 ENCOUNTER — Ambulatory Visit (INDEPENDENT_AMBULATORY_CARE_PROVIDER_SITE_OTHER): Payer: BC Managed Care – PPO | Admitting: Dermatology

## 2022-09-28 ENCOUNTER — Telehealth: Payer: Self-pay

## 2022-09-28 DIAGNOSIS — D485 Neoplasm of uncertain behavior of skin: Secondary | ICD-10-CM | POA: Diagnosis not present

## 2022-09-28 DIAGNOSIS — L720 Epidermal cyst: Secondary | ICD-10-CM

## 2022-09-28 DIAGNOSIS — L905 Scar conditions and fibrosis of skin: Secondary | ICD-10-CM | POA: Diagnosis not present

## 2022-09-28 MED ORDER — DOXYCYCLINE HYCLATE 100 MG PO TABS: 100.0000 mg | ORAL_TABLET | Freq: Two times a day (BID) | ORAL | 0 refills | Status: AC

## 2022-09-28 NOTE — Patient Instructions (Signed)

## 2022-09-28 NOTE — Progress Notes (Signed)
Follow-Up Visit   Subjective  Jordan Buchanan is a 51 y.o. male who presents for the following: Cyst (Left preauricular - excise today) and Follow-up (Post op - cyst of left post neck at hairline).  The following portions of the chart were reviewed this encounter and updated as appropriate:   Tobacco  Allergies  Meds  Problems  Med Hx  Surg Hx  Fam Hx     Review of Systems:  No other skin or systemic complaints except as noted in HPI or Assessment and Plan.  Objective  Well appearing patient in no apparent distress; mood and affect are within normal limits.  A focused examination was performed including face. Relevant physical exam findings are noted in the Assessment and Plan.  Left Preauricular Area Cystic papule 2.2 x 0.5cm  Left post neck at hairline Healing excision site   Assessment & Plan  Neoplasm of uncertain behavior of skin Left Preauricular Area  Skin excision  Lesion length (cm):  2.2 Lesion width (cm):  0.5 Margin per side (cm):  0 Total excision diameter (cm):  2.2 Informed consent: discussed and consent obtained   Timeout: patient name, date of birth, surgical site, and procedure verified   Procedure prep:  Patient was prepped and draped in usual sterile fashion Prep type:  Isopropyl alcohol and povidone-iodine Anesthesia: the lesion was anesthetized in a standard fashion   Anesthetic:  1% lidocaine w/ epinephrine 1-100,000 buffered w/ 8.4% NaHCO3 (6cc lido w/ epi, 1cc bupivicaine, Total of 7cc) Instrument used: #15 blade   Hemostasis achieved with: pressure   Hemostasis achieved with comment:  Electrocautery Outcome: patient tolerated procedure well with no complications   Post-procedure details: sterile dressing applied and wound care instructions given   Dressing type: bandage, pressure dressing and bacitracin (mupirocin)    Skin repair Complexity:  Complex Final length (cm):  3 Reason for type of repair: reduce tension to allow closure,  reduce the risk of dehiscence, infection, and necrosis, reduce subcutaneous dead space and avoid a hematoma, allow closure of the large defect, preserve normal anatomy, preserve normal anatomical and functional relationships and enhance both functionality and cosmetic results   Undermining: area extensively undermined   Undermining comment:  Undermining defect 2.2cm Subcutaneous layers (deep stitches):  Suture size:  5-0 Suture type: Vicryl (polyglactin 910)   Subcutaneous suture technique: inverted dermal. Fine/surface layer approximation (top stitches):  Suture size:  5-0 Suture type: nylon   Stitches: simple running   Suture removal (days):  7 Hemostasis achieved with: suture and pressure Outcome: patient tolerated procedure well with no complications   Post-procedure details: sterile dressing applied and wound care instructions given   Dressing type: bandage, pressure dressing and bacitracin (mupirocin)    doxycycline (VIBRA-TABS) 100 MG tablet Take 1 tablet (100 mg total) by mouth 2 (two) times daily for 7 days. With food and plenty of fluid  Specimen 1 - Surgical pathology Differential Diagnosis: Cyst vs other  Check Margins: No  Cyst vs other, excised today Start Mupirocin oint qd to excision site Start Doxycycline 100 mg 1 po bid x 7 days  Epidermal inclusion cyst Left post neck at hairline  Encounter for Removal of Sutures - Incision site at the left post neck at hairline is clean, dry and intact - Wound cleansed, sutures removed, wound cleansed and steri strips applied.  - Discussed pathology results showing cyst  - Patient advised to keep steri-strips dry until they fall off. - Scars remodel for a full year. -  Once steri-strips fall off, patient can apply over-the-counter silicone scar cream each night to help with scar remodeling if desired. - Patient advised to call with any concerns or if they notice any new or changing lesions.    Return in about 1 week  (around 10/05/2022) for suture removal.  I, Othelia Pulling, RMA, am acting as scribe for Sarina Ser, MD . Documentation: I have reviewed the above documentation for accuracy and completeness, and I agree with the above.  Sarina Ser, MD

## 2022-09-28 NOTE — Telephone Encounter (Signed)
Pt doing well after todays surgery./sh 

## 2022-09-30 DIAGNOSIS — M9901 Segmental and somatic dysfunction of cervical region: Secondary | ICD-10-CM | POA: Diagnosis not present

## 2022-10-04 DIAGNOSIS — M9901 Segmental and somatic dysfunction of cervical region: Secondary | ICD-10-CM | POA: Diagnosis not present

## 2022-10-05 ENCOUNTER — Encounter: Payer: Self-pay | Admitting: Dermatology

## 2022-10-05 ENCOUNTER — Ambulatory Visit (INDEPENDENT_AMBULATORY_CARE_PROVIDER_SITE_OTHER): Payer: BC Managed Care – PPO | Admitting: Dermatology

## 2022-10-05 DIAGNOSIS — Z4802 Encounter for removal of sutures: Secondary | ICD-10-CM

## 2022-10-05 DIAGNOSIS — L905 Scar conditions and fibrosis of skin: Secondary | ICD-10-CM

## 2022-10-05 NOTE — Patient Instructions (Signed)
Due to recent changes in healthcare laws, you may see results of your pathology and/or laboratory studies on MyChart before the doctors have had a chance to review them. We understand that in some cases there may be results that are confusing or concerning to you. Please understand that not all results are received at the same time and often the doctors may need to interpret multiple results in order to provide you with the best plan of care or course of treatment. Therefore, we ask that you please give us 2 business days to thoroughly review all your results before contacting the office for clarification. Should we see a critical lab result, you will be contacted sooner.   If You Need Anything After Your Visit  If you have any questions or concerns for your doctor, please call our main line at [phone] and press option 4 to reach your doctor's medical assistant. If no one answers, please leave a voicemail as directed and we will return your call as soon as possible. Messages left after 4 pm will be answered the following business day.   You may also send us a message via MyChart. We typically respond to MyChart messages within 1-2 business days.  For prescription refills, please ask your pharmacy to contact our office. Our fax number is [phone].  If you have an urgent issue when the clinic is closed that cannot wait until the next business day, you can page your doctor at the number below.    Please note that while we do our best to be available for urgent issues outside of office hours, we are not available 24/7.   If you have an urgent issue and are unable to reach us, you may choose to seek medical care at your doctor's office, retail clinic, urgent care center, or emergency room.  If you have a medical emergency, please immediately call 911 or go to the emergency department.  Pager Numbers  - Dr. Kowalski: [phone]  - Dr. Moye: [phone]  - Dr. Stewart:  [phone]  In the event of inclement weather, please call our main line at [phone] for an update on the status of any delays or closures.  Dermatology Medication Tips: Please keep the boxes that topical medications come in in order to help keep track of the instructions about where and how to use these. Pharmacies typically print the medication instructions only on the boxes and not directly on the medication tubes.   If your medication is too expensive, please contact our office at [phone] option 4 or send us a message through MyChart.   We are unable to tell what your co-pay for medications will be in advance as this is different depending on your insurance coverage. However, we may be able to find a substitute medication at lower cost or fill out paperwork to get insurance to cover a needed medication.   If a prior authorization is required to get your medication covered by your insurance company, please allow us 1-2 business days to complete this process.  Drug prices often vary depending on where the prescription is filled and some pharmacies may offer cheaper prices.  The website www.goodrx.com contains coupons for medications through different pharmacies. The prices here do not account for what the cost may be with help from insurance (it may be cheaper with your insurance), but the website can give you the price if you did not use any insurance.  - You can print the associated coupon and take it with   your prescription to the pharmacy.  - You may also stop by our office during regular business hours and pick up a GoodRx coupon card.  - If you need your prescription sent electronically to a different pharmacy, notify our office through [facility] MyChart or by phone at [phone] option 4.     Si Usted Necesita Algo Despus de Su Visita  Tambin puede enviarnos un mensaje a travs de MyChart. Por lo general respondemos a los mensajes de MyChart en el transcurso de 1 a 2  das hbiles.  Para renovar recetas, por favor pida a su farmacia que se ponga en contacto con nuestra oficina. Nuestro nmero de fax es el [phone].  Si tiene un asunto urgente cuando la clnica est cerrada y que no puede esperar hasta el siguiente da hbil, puede llamar/localizar a su doctor(a) al nmero que aparece a continuacin.   Por favor, tenga en cuenta que aunque hacemos todo lo posible para estar disponibles para asuntos urgentes fuera del horario de oficina, no estamos disponibles las 24 horas del da, los 7 das de la semana.   Si tiene un problema urgente y no puede comunicarse con nosotros, puede optar por buscar atencin mdica  en el consultorio de su doctor(a), en una clnica privada, en un centro de atencin urgente o en una sala de emergencias.  Si tiene una emergencia mdica, por favor llame inmediatamente al 911 o vaya a la sala de emergencias.  Nmeros de bper  - Dr. Kowalski: [phone]  - Dra. Moye: [phone]  - Dra. Stewart: [phone]  En caso de inclemencias del tiempo, por favor llame a nuestra lnea principal al [phone] para una actualizacin sobre el estado de cualquier retraso o cierre.  Consejos para la medicacin en dermatologa: Por favor, guarde las cajas en las que vienen los medicamentos de uso tpico para ayudarle a seguir las instrucciones sobre dnde y cmo usarlos. Las farmacias generalmente imprimen las instrucciones del medicamento slo en las cajas y no directamente en los tubos del medicamento.   Si su medicamento es muy caro, por favor, pngase en contacto con nuestra oficina llamando al [phone] y presione la opcin 4 o envenos un mensaje a travs de MyChart.   No podemos decirle cul ser su copago por los medicamentos por adelantado ya que esto es diferente dependiendo de la cobertura de su seguro. Sin embargo, es posible que podamos encontrar un medicamento sustituto a menor costo o llenar un formulario para que el  seguro cubra el medicamento que se considera necesario.   Si se requiere una autorizacin previa para que su compaa de seguros cubra su medicamento, por favor permtanos de 1 a 2 das hbiles para completar este proceso.  Los precios de los medicamentos varan con frecuencia dependiendo del lugar de dnde se surte la receta y alguna farmacias pueden ofrecer precios ms baratos.  El sitio web www.goodrx.com tiene cupones para medicamentos de diferentes farmacias. Los precios aqu no tienen en cuenta lo que podra costar con la ayuda del seguro (puede ser ms barato con su seguro), pero el sitio web puede darle el precio si no utiliz ningn seguro.  - Puede imprimir el cupn correspondiente y llevarlo con su receta a la farmacia.  - Tambin puede pasar por nuestra oficina durante el horario de atencin regular y recoger una tarjeta de cupones de GoodRx.  - Si necesita que su receta se enve electrnicamente a una farmacia diferente, informe a nuestra oficina a travs de MyChart de [place]   o por telfono llamando al [phone] y presione la opcin 4.  

## 2022-10-05 NOTE — Progress Notes (Signed)
   Follow-Up Visit   Subjective  Jordan Buchanan is a 51 y.o. 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  The following portions of the chart were reviewed this encounter and updated as appropriate:   Tobacco  Allergies  Meds  Problems  Med Hx  Surg Hx  Fam Hx     Review of Systems:  No other skin or systemic complaints except as noted in HPI or Assessment and Plan.  Objective  Well appearing patient in no apparent distress; mood and affect are within normal limits.  A focused examination was performed including the face. Relevant physical exam findings are noted in the Assessment and Plan.  L pre auricular Healing excision site.    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  - Discussed pathology results showing benign fibrosis, no malignancy  - Patient advised to keep steri-strips dry until they fall off. - Scars remodel for a full year. - Once steri-strips fall off, patient can apply over-the-counter silicone scar cream each night to help with scar remodeling if desired. - Patient advised to call with any concerns or if they notice any new or changing lesions.    Return in about 1 year (around 10/06/2023) for TBSE - hx dysplastic nevi .  Luther Redo, CMA, am acting as scribe for Sarina Ser, MD . Documentation: I have reviewed the above documentation for accuracy and completeness, and I agree with the above.  Sarina Ser, MD

## 2022-10-06 DIAGNOSIS — M9901 Segmental and somatic dysfunction of cervical region: Secondary | ICD-10-CM | POA: Diagnosis not present

## 2022-10-07 DIAGNOSIS — M9901 Segmental and somatic dysfunction of cervical region: Secondary | ICD-10-CM | POA: Diagnosis not present

## 2022-10-13 DIAGNOSIS — M9901 Segmental and somatic dysfunction of cervical region: Secondary | ICD-10-CM | POA: Diagnosis not present

## 2022-10-14 DIAGNOSIS — M9901 Segmental and somatic dysfunction of cervical region: Secondary | ICD-10-CM | POA: Diagnosis not present

## 2022-10-15 ENCOUNTER — Encounter: Payer: Self-pay | Admitting: Dermatology

## 2022-10-18 DIAGNOSIS — M9901 Segmental and somatic dysfunction of cervical region: Secondary | ICD-10-CM | POA: Diagnosis not present

## 2022-10-19 DIAGNOSIS — M47814 Spondylosis without myelopathy or radiculopathy, thoracic region: Secondary | ICD-10-CM | POA: Diagnosis not present

## 2022-10-19 DIAGNOSIS — M47816 Spondylosis without myelopathy or radiculopathy, lumbar region: Secondary | ICD-10-CM | POA: Diagnosis not present

## 2022-10-20 DIAGNOSIS — M9901 Segmental and somatic dysfunction of cervical region: Secondary | ICD-10-CM | POA: Diagnosis not present

## 2022-10-26 DIAGNOSIS — M9901 Segmental and somatic dysfunction of cervical region: Secondary | ICD-10-CM | POA: Diagnosis not present

## 2022-10-28 DIAGNOSIS — M9901 Segmental and somatic dysfunction of cervical region: Secondary | ICD-10-CM | POA: Diagnosis not present

## 2022-11-02 DIAGNOSIS — M9901 Segmental and somatic dysfunction of cervical region: Secondary | ICD-10-CM | POA: Diagnosis not present

## 2022-11-04 ENCOUNTER — Ambulatory Visit: Payer: BC Managed Care – PPO | Admitting: Podiatry

## 2022-11-04 ENCOUNTER — Encounter: Payer: Self-pay | Admitting: Podiatry

## 2022-11-04 ENCOUNTER — Ambulatory Visit (INDEPENDENT_AMBULATORY_CARE_PROVIDER_SITE_OTHER): Payer: BC Managed Care – PPO

## 2022-11-04 DIAGNOSIS — M722 Plantar fascial fibromatosis: Secondary | ICD-10-CM

## 2022-11-04 DIAGNOSIS — S90852A Superficial foreign body, left foot, initial encounter: Secondary | ICD-10-CM | POA: Diagnosis not present

## 2022-11-04 DIAGNOSIS — D2372 Other benign neoplasm of skin of left lower limb, including hip: Secondary | ICD-10-CM

## 2022-11-04 NOTE — Progress Notes (Signed)
He presents today with chief concern of pain beneath the fifth metatarsal phalangeal joint of the left foot.  States that he remembers dropping some glass in the garage as he may have stepped on it but he does not recall seeing any blood in his shoe or sock and states that this has been several weeks ago now at this point.  Does relate standing on his foot this past weekend for 2 days straight states that it really started to hurt after that.  Objective: Vital signs are stable he is alert oriented x3.  There is no erythema edema salines drainage odor he does have a small area of reactive hyperkeratosis of the fifth metatarsal head with what appears to be a very small punctated darkened lesion.  I was able to debride the area and enucleate that lesion but never found any foreign body.  Radiographs taken today of the area in question marked with a marker do not demonstrate any type of radiopaque foreign body.  And the bone appeared to be normal.  Assessment: Possible foreign body cannot be ruled out but did not remove it today   Plan: Debrided the area of reactive hyperkeratosis or possible foreign body and then applied Salinocaine under occlusion to be left on 2 to 3 days without getting with the washed off thoroughly.  If this does not resolve his problem that he is to notify us and return to the clinic.

## 2022-11-22 ENCOUNTER — Telehealth: Payer: Self-pay

## 2022-11-22 ENCOUNTER — Ambulatory Visit: Payer: BC Managed Care – PPO | Admitting: Dermatology

## 2022-11-22 DIAGNOSIS — R21 Rash and other nonspecific skin eruption: Secondary | ICD-10-CM

## 2022-11-22 NOTE — Telephone Encounter (Signed)
Patient called LM on VM he was in the office for a cyst removal in Oct on his face, patient report this area has not healed and bleeding.    LM on VM returning his call, please call back

## 2022-11-22 NOTE — Patient Instructions (Signed)
Due to recent changes in healthcare laws, you may see results of your pathology and/or laboratory studies on MyChart before the doctors have had a chance to review them. We understand that in some cases there may be results that are confusing or concerning to you. Please understand that not all results are received at the same time and often the doctors may need to interpret multiple results in order to provide you with the best plan of care or course of treatment. Therefore, we ask that you please give us 2 business days to thoroughly review all your results before contacting the office for clarification. Should we see a critical lab result, you will be contacted sooner.   If You Need Anything After Your Visit  If you have any questions or concerns for your doctor, please call our main line at [phone] and press option 4 to reach your doctor's medical assistant. If no one answers, please leave a voicemail as directed and we will return your call as soon as possible. Messages left after 4 pm will be answered the following business day.   You may also send us a message via MyChart. We typically respond to MyChart messages within 1-2 business days.  For prescription refills, please ask your pharmacy to contact our office. Our fax number is [phone].  If you have an urgent issue when the clinic is closed that cannot wait until the next business day, you can page your doctor at the number below.    Please note that while we do our best to be available for urgent issues outside of office hours, we are not available 24/7.   If you have an urgent issue and are unable to reach us, you may choose to seek medical care at your doctor's office, retail clinic, urgent care center, or emergency room.  If you have a medical emergency, please immediately call 911 or go to the emergency department.  Pager Numbers  - Dr. Kowalski: [phone]  - Dr. Moye: [phone]  - Dr. Stewart:  [phone]  In the event of inclement weather, please call our main line at [phone] for an update on the status of any delays or closures.  Dermatology Medication Tips: Please keep the boxes that topical medications come in in order to help keep track of the instructions about where and how to use these. Pharmacies typically print the medication instructions only on the boxes and not directly on the medication tubes.   If your medication is too expensive, please contact our office at [phone] option 4 or send us a message through MyChart.   We are unable to tell what your co-pay for medications will be in advance as this is different depending on your insurance coverage. However, we may be able to find a substitute medication at lower cost or fill out paperwork to get insurance to cover a needed medication.   If a prior authorization is required to get your medication covered by your insurance company, please allow us 1-2 business days to complete this process.  Drug prices often vary depending on where the prescription is filled and some pharmacies may offer cheaper prices.  The website www.goodrx.com contains coupons for medications through different pharmacies. The prices here do not account for what the cost may be with help from insurance (it may be cheaper with your insurance), but the website can give you the price if you did not use any insurance.  - You can print the associated coupon and take it with   your prescription to the pharmacy.  - You may also stop by our office during regular business hours and pick up a GoodRx coupon card.  - If you need your prescription sent electronically to a different pharmacy, notify our office through [facility] MyChart or by phone at [phone] option 4.     Si Usted Necesita Algo Despus de Su Visita  Tambin puede enviarnos un mensaje a travs de MyChart. Por lo general respondemos a los mensajes de MyChart en el transcurso de 1 a 2  das hbiles.  Para renovar recetas, por favor pida a su farmacia que se ponga en contacto con nuestra oficina. Nuestro nmero de fax es el [phone].  Si tiene un asunto urgente cuando la clnica est cerrada y que no puede esperar hasta el siguiente da hbil, puede llamar/localizar a su doctor(a) al nmero que aparece a continuacin.   Por favor, tenga en cuenta que aunque hacemos todo lo posible para estar disponibles para asuntos urgentes fuera del horario de oficina, no estamos disponibles las 24 horas del da, los 7 das de la semana.   Si tiene un problema urgente y no puede comunicarse con nosotros, puede optar por buscar atencin mdica  en el consultorio de su doctor(a), en una clnica privada, en un centro de atencin urgente o en una sala de emergencias.  Si tiene una emergencia mdica, por favor llame inmediatamente al 911 o vaya a la sala de emergencias.  Nmeros de bper  - Dr. Kowalski: [phone]  - Dra. Moye: [phone]  - Dra. Stewart: [phone]  En caso de inclemencias del tiempo, por favor llame a nuestra lnea principal al [phone] para una actualizacin sobre el estado de cualquier retraso o cierre.  Consejos para la medicacin en dermatologa: Por favor, guarde las cajas en las que vienen los medicamentos de uso tpico para ayudarle a seguir las instrucciones sobre dnde y cmo usarlos. Las farmacias generalmente imprimen las instrucciones del medicamento slo en las cajas y no directamente en los tubos del medicamento.   Si su medicamento es muy caro, por favor, pngase en contacto con nuestra oficina llamando al [phone] y presione la opcin 4 o envenos un mensaje a travs de MyChart.   No podemos decirle cul ser su copago por los medicamentos por adelantado ya que esto es diferente dependiendo de la cobertura de su seguro. Sin embargo, es posible que podamos encontrar un medicamento sustituto a menor costo o llenar un formulario para que el  seguro cubra el medicamento que se considera necesario.   Si se requiere una autorizacin previa para que su compaa de seguros cubra su medicamento, por favor permtanos de 1 a 2 das hbiles para completar este proceso.  Los precios de los medicamentos varan con frecuencia dependiendo del lugar de dnde se surte la receta y alguna farmacias pueden ofrecer precios ms baratos.  El sitio web www.goodrx.com tiene cupones para medicamentos de diferentes farmacias. Los precios aqu no tienen en cuenta lo que podra costar con la ayuda del seguro (puede ser ms barato con su seguro), pero el sitio web puede darle el precio si no utiliz ningn seguro.  - Puede imprimir el cupn correspondiente y llevarlo con su receta a la farmacia.  - Tambin puede pasar por nuestra oficina durante el horario de atencin regular y recoger una tarjeta de cupones de GoodRx.  - Si necesita que su receta se enve electrnicamente a una farmacia diferente, informe a nuestra oficina a travs de MyChart de [place]   o por telfono llamando al [phone] y presione la opcin 4.  

## 2022-11-22 NOTE — Progress Notes (Signed)
   Follow-Up Visit   Subjective  Jordan Buchanan is a 51 y.o. male who presents for the following: Other (Recheck cyst excision site of left preauricular - wife pulled out a spitting suture about 2 weeks ago then last week he got a knot that was sore and ruptured and bled).  The following portions of the chart were reviewed this encounter and updated as appropriate:   Tobacco  Allergies  Meds  Problems  Med Hx  Surg Hx  Fam Hx     Review of Systems:  No other skin or systemic complaints except as noted in HPI or Assessment and Plan.  Objective  Well appearing patient in no apparent distress; mood and affect are within normal limits.  A focused examination was performed including face. Relevant physical exam findings are noted in the Assessment and Plan.  Left Preauricular Area Spitting suture   Assessment & Plan  Rash Left Preauricular Area  Spitting suture removed. Advised patient no evidence of infection.  Reassured and advised he may have further spitting sutures occur. He understands.  Return for Follow up as scheduled.  I, Ashok Cordia, CMA, am acting as scribe for Sarina Ser, MD . Documentation: I have reviewed the above documentation for accuracy and completeness, and I agree with the above.  Sarina Ser, MD

## 2022-12-04 ENCOUNTER — Encounter: Payer: Self-pay | Admitting: Dermatology

## 2022-12-04 DIAGNOSIS — J019 Acute sinusitis, unspecified: Secondary | ICD-10-CM | POA: Diagnosis not present

## 2022-12-05 ENCOUNTER — Ambulatory Visit
Admission: EM | Admit: 2022-12-05 | Discharge: 2022-12-05 | Disposition: A | Discharge status: Home / Self Care | Payer: BC Managed Care – PPO | Attending: Family Medicine | Admitting: Family Medicine

## 2022-12-05 DIAGNOSIS — Z79899 Other long term (current) drug therapy: Secondary | ICD-10-CM | POA: Insufficient documentation

## 2022-12-05 DIAGNOSIS — J111 Influenza due to unidentified influenza virus with other respiratory manifestations: Secondary | ICD-10-CM

## 2022-12-05 DIAGNOSIS — Z1152 Encounter for screening for COVID-19: Secondary | ICD-10-CM | POA: Diagnosis not present

## 2022-12-05 DIAGNOSIS — J101 Influenza due to other identified influenza virus with other respiratory manifestations: Secondary | ICD-10-CM | POA: Diagnosis not present

## 2022-12-05 LAB — RESP PANEL BY RT-PCR (RSV, FLU A&B, COVID)  RVPGX2
Influenza A by PCR: POSITIVE — AB
Influenza B by PCR: NEGATIVE
Resp Syncytial Virus by PCR: NEGATIVE
SARS Coronavirus 2 by RT PCR: NEGATIVE

## 2022-12-05 MED ORDER — OSELTAMIVIR PHOSPHATE 75 MG PO CAPS: 75.0000 mg | ORAL_CAPSULE | Freq: Two times a day (BID) | ORAL | 0 refills | Status: DC

## 2022-12-05 MED ORDER — PROMETHAZINE-DM 6.25-15 MG/5ML PO SYRP: 5.0000 mL | ORAL_SOLUTION | Freq: Four times a day (QID) | ORAL | 0 refills | Status: DC | PRN

## 2022-12-05 NOTE — ED Provider Notes (Signed)
[facility]    CSN: [id number] Arrival date & time: 12/05/22  1009      History   Chief Complaint Chief Complaint  Patient presents with   Influenza    Congestion,achy,cough - Entered by patient    HPI Jordan Buchanan is a 51 y.o. male.   HPI   Jordan Buchanan presents for flulike symptoms for the past day or two.  He has been having nasal congestion, intermittent headache, cough that is productive of brown sputum with bodyaches.  He has been taking over-the-counter medications.  He was recently went to the beach but then had to return due to his symptoms.  His wife is also starting to have symptoms.  He has not had a fever but has felt warm and is experiencing chills.  He did not sleep well last night because of the cough.  There is not been any belly pain, vomiting, diarrhea, chest pain or shortness of breath.  He has had some sore throat with coughing and nausea with the postnasal drip.      Past Medical History:  Diagnosis Date   Acute calculous cholecystitis s/p lap cholecystectomy 04/30/2020 04/30/2020   Arrhythmia    PALPITATIONS AND PVC - improved with cutting down on caffeine   Cough variant asthma 05/28/2014   05/27/2014 p extensive coaching HFA effectiveness =    75% > try dulera 100 2bid  - PFT's 07/03/2014 FEV1 4.15 (98%) ratio 89 and no chang p B2 and nl fef25-75  And nl dlco     COVID-19 virus infection 11/2020   Diverticulitis of large intestine with perforation and abscess s/p lap drainage & washout 04/30/2020 04/13/2020   Diverticulosis 07/2015   by CT scan   Dysplastic nevus 05/15/2020   Left lat. base of neck. Moderate atypia, lateral margin involved.    GERD (gastroesophageal reflux disease)    Hepatic steatosis    Hypertension    h/o LVH, resolved   Migraines    Perforated diverticulum of large intestine 03/2020   hospitalization   Pneumonia    Prostatitis 05/09/2013   Seasonal allergies    Spondyloarthropathy    ?AS, HLA-B27 +, CCP neg, prior on  humira (Dr. 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   RUQ abdominal pain 05/04/2021   Diverticulitis left colon s/p robotic rectosigmoid resection LAR 06/17/2020 06/17/2020   Acute calculous cholecystitis s/p lap chole 04/2020 04/30/2020   Diverticulitis of large intestine with perforation and abscess s/p lap drainage & washout 04/30/2020 04/13/2020   Incontinence of feces 08/27/2019   Health maintenance examination 09/08/2018   Chest pain 07/04/2018   Urticaria 04/24/2018   Skin rash 04/24/2018   Family history of multiple sclerosis 04/27/2017   Former smoker 04/27/2017   Spondyloarthropathy    OSA (obstructive sleep apnea) 08/19/2016   Low back pain radiating to left lower extremity 08/06/2016   Severe obesity (BMI 35.0-39.9) with comorbidity ([place]) 04/21/2016   Cough, persistent 03/10/2016   Cough variant asthma  05/28/2014   Pruritic condition 04/18/2014   Hepatic steatosis    Migraines    Perennial allergic rhinitis    GERD (gastroesophageal reflux disease)    Hypertension     Past Surgical History:  Procedure Laterality Date   CHOLECYSTECTOMY N/A 04/30/2020   Procedure: LAPAROSCOPIC CHOLECYSTECTOMY WITH INTRAOPERATIVE CHOLANGIOGRAM, BILATERAL TAP BLOCK;  Surgeon: Michael Boston, MD;  Location:  [facility];  Service: General;  Laterality: N/A;   ESOPHAGOGASTRODUODENOSCOPY  08/2018   esophagus dilated, biopsy showed chronic gastritis with mod reflux changes ([place])   LAPAROSCOPY N/A 04/30/2020   Procedure: LAPAROSCOPY DIAGNOSTIC WITH WASHOUT AND DRAINAGE PLACEMENT, DRAINAGE OF DIVERTICULITIS ABSCESS;  Surgeon: Michael Boston, MD;  Location: [facility];  Service: General;  Laterality: N/A;   NASAL RECONSTRUCTION WITH SEPTAL REPAIR  2017   [place]   OTHER SURGICAL HISTORY  1992   facial surgery   PROCTOSCOPY N/A 06/17/2020   Procedure: RIGID PROCTOSCOPY;  Surgeon: Michael Boston,  MD;  Location: [facility];  Service: General;  Laterality: N/A;   ROBOTIC LOW ANTERIOR RECTOSIGMOID RESECTION  06/17/2020   for recurrent L sided diverticulitis ([place])   ROTATOR CUFF REPAIR  2009   left   SINOSCOPY     UMBILICAL HERNIA REPAIR  05/10/2012   Procedure: HERNIA REPAIR UMBILICAL ADULT;  Surgeon: Harl Bowie, MD;  Location: [place];  Service: General;  Laterality: N/A;       Home Medications    Prior to Admission medications   Medication Sig Start Date End Date Taking? Authorizing Provider  albuterol (VENTOLIN HFA) 108 (90 Base) MCG/ACT inhaler Inhale 2 puffs into the lungs every 4 (four) hours as needed for wheezing or shortness of breath. 12/24/20  Yes Margarette Canada, NP  amLODipine (NORVASC) 10 MG tablet Take 1 tablet (10 mg total) by mouth daily. 05/10/22  Yes Ria Bush, MD  cetirizine (ZYRTEC) 10 MG tablet Take 10 mg by mouth daily.   Yes [provider]  famotidine (PEPCID) 20 MG tablet Take 1 tablet (20 mg total) by mouth at bedtime. 05/10/22  Yes Ria Bush, MD  METAMUCIL FIBER PO Take by mouth daily.   Yes [provider]  metoprolol succinate (TOPROL-XL) 50 MG 24 hr tablet Take 1 tablet (50 mg total) by mouth daily. Take with or immediately following a meal. 05/10/22  Yes Ria Bush, MD  Multiple Vitamins-Minerals (MULTIVITAMIN MEN PO) Take by mouth daily.   Yes [provider]  mupirocin ointment (BACTROBAN) 2 % Apply 1 Application topically daily. With dressing changes 09/21/22  Yes Ralene Bathe, MD  Omega-3 Fatty Acids (FISH OIL) 1000 MG CAPS Take 1 capsule (1,000 mg total) by mouth daily. 05/10/22  Yes Ria Bush, MD  oseltamivir (TAMIFLU) 75 MG capsule Take 1 capsule (75 mg total) by mouth every 12 (twelve) hours. 12/05/22  Yes Corena Tilson, DO  promethazine-dextromethorphan (PROMETHAZINE-DM) 6.25-15 MG/5ML syrup Take 5 mLs by mouth 4 (four) times daily as needed. 12/05/22  Yes Kinte Trim,  Adin Laker, DO  sucralfate (CARAFATE) 1 g tablet Take 1 g by mouth 4 (four) times daily. 05/30/21  Yes [provider]  Turmeric 500 MG CAPS Take 2 capsules by mouth daily. 05/10/22  Yes Ria Bush, MD  pantoprazole (PROTONIX) 40 MG tablet Take 40 mg by mouth 2 (two) times daily.  09/06/19 05/10/22  [provider]    Family History Family History  Problem Relation Age of Onset   Asthma Father    Arthritis Father        father, brother, Pgrandfather   Chronic bronchitis Father    Allergies Father    Hypertension Brother        obese   CAD Paternal Grandfather        MI x2   Multiple sclerosis Brother    Stroke Neg Hx    Cancer Neg Hx     Social History Social  History   Tobacco Use   Smoking status: Former    Packs/day: 1.00    Years: 13.00    Total pack years: 13.00    Types: Cigarettes    Quit date: 12/21/1999    Years since quitting: 22.9   Smokeless tobacco: Former    Types: Snuff    Quit date: 12/20/2013  Vaping Use   Vaping Use: Never used  Substance Use Topics   Alcohol use: No    Alcohol/week: 0.0 standard drinks of alcohol    Comment: quit 2002   Drug use: Not Currently    Types: Marijuana    Comment: quit 2002     Allergies   Hctz [hydrochlorothiazide]   Review of Systems Review of Systems: negative unless otherwise stated in HPI.      Physical Exam Triage Vital Signs ED Triage Vitals  Enc Vitals Group     BP 12/05/22 1208 (!) 147/87     Pulse Rate 12/05/22 1208 92     Resp 12/05/22 1208 16     Temp 12/05/22 1208 99.3 F (37.4 C)     Temp Source 12/05/22 1208 Oral     SpO2 12/05/22 1208 95 %     Weight 12/05/22 1207 245 lb (111.1 kg)     Height 12/05/22 1207 _0  (1.778 m)     Head Circumference --      Peak Flow --      Pain Score 12/05/22 1206 6     Pain Loc --      Pain Edu? --      Excl. in [place]? --    No data found.  Updated Vital Signs BP (!) 147/87 (BP Location: Left Arm)   Pulse 92   Temp 99.3 F (37.4  C) (Oral)   Resp 16   Ht _1  (1.778 m)   Wt 111.1 kg   SpO2 95%   BMI 35.15 kg/m   Visual Acuity Right Eye Distance:   Left Eye Distance:   Bilateral Distance:    Right Eye Near:   Left Eye Near:    Bilateral Near:     Physical Exam GEN:     alert, non-toxic appearing male in no distress    HENT:  mucus membranes moist, oropharyngeal without lesions or exudate, no tonsillar hypertrophy, mild oropharyngeal erythema, moderate erythematous edematous turbinates, clear nasal discharge, bilateral TM normal EYES:   pupils equal and reactive, no scleral injection or discharge NECK:  normal ROM, no lymphadenopathy, no meningismus   RESP:  no increased work of breathing, clear to auscultation bilaterally CVS:   regular rate and rhythm Skin:   warm and dry    UC Treatments / Results  Labs (all labs ordered are listed, but only abnormal results are displayed) Labs Reviewed  RESP PANEL BY RT-PCR (RSV, FLU A&B, COVID)  RVPGX2 - Abnormal; Notable for the following components:      Result Value   Influenza A by PCR POSITIVE (*)    All other components within normal limits    EKG   Radiology No results found.  Procedures Procedures (including critical care time)  Medications Ordered in UC Medications - No data to display  Initial Impression / Assessment and Plan / UC Course  I have reviewed the triage vital signs and the nursing notes.  Pertinent labs & imaging results that were available during my care of the patient were reviewed by me and considered in my medical decision making (see chart for  details).       Pt is a 51 y.o. male who presents for 1-2 days of respiratory symptoms. Stanely has an elevated temperature here of 99.3 F Satting adequately at 95%, on room air. Overall pt is ill but non-toxic appearing, well hydrated, without respiratory distress. Pulmonary exam is unremarkable.  COVID and influenza testing obtained and was positive for influenza A.  He is  interested in Tamiflu.  Prescription written for Tamiflu sent to the pharmacy.  Cough is keeping him up at night and we discussed Promethazine DM.  Prescription for that also sent to the pharmacy.  Typical duration of symptoms discussed.  Work note provided.  Return and ED precautions given and voiced understanding. Discussed MDM, treatment plan and plan for follow-up with patient who agrees with plan.     Final Clinical Impressions(s) / UC Diagnoses   Final diagnoses:  Influenza with respiratory manifestation     Discharge Instructions      You have tested positive for influenza A.  If your were prescribed medication, stop by the pharmacy to pick them up.   You can take Tylenol and/or Ibuprofen as needed for fever reduction and pain relief.    For cough: honey 1/2 to 1 teaspoon (you can dilute the honey in water or another fluid).  You can also use guaifenesin and dextromethorphan for cough. You can use a humidifier for chest congestion and cough.  If you don't have a humidifier, you can sit in the bathroom with the hot shower running.      For sore throat: try warm salt water gargles, Mucinex sore throat cough drops or cepacol lozenges, throat spray, warm tea or water with lemon/honey, popsicles or ice, or OTC cold relief medicine for throat discomfort. You can also purchase chloraseptic spray at the pharmacy or dollar store.   For congestion: take a daily anti-histamine like Zyrtec, Claritin, and a oral decongestant, such as pseudoephedrine.  You can also use Flonase 1-2 sprays in each nostril daily. Afrin is also a good option, if you do not have high blood pressure.    It is important to stay hydrated: drink plenty of fluids (water, gatorade/powerade/pedialyte, juices, or teas) to keep your throat moisturized and help further relieve irritation/discomfort.    Return or go to the Emergency Department if symptoms worsen or do not improve in the next few days      ED Prescriptions      Medication Sig Dispense Auth. Provider   oseltamivir (TAMIFLU) 75 MG capsule Take 1 capsule (75 mg total) by mouth every 12 (twelve) hours. 10 capsule Larken Urias, DO   promethazine-dextromethorphan (PROMETHAZINE-DM) 6.25-15 MG/5ML syrup Take 5 mLs by mouth 4 (four) times daily as needed. 118 mL Lyndee Hensen, DO      PDMP not reviewed this encounter.   Lyndee Hensen, DO 12/05/22 1333

## 2022-12-05 NOTE — Discharge Instructions (Addendum)
You have tested positive for influenza A.  If your were prescribed medication, stop by the pharmacy to pick them up.   You can take Tylenol and/or Ibuprofen as needed for fever reduction and pain relief.    For cough: honey 1/2 to 1 teaspoon (you can dilute the honey in water or another fluid).  You can also use guaifenesin and dextromethorphan for cough. You can use a humidifier for chest congestion and cough.  If you don't have a humidifier, you can sit in the bathroom with the hot shower running.      For sore throat: try warm salt water gargles, Mucinex sore throat cough drops or cepacol lozenges, throat spray, warm tea or water with lemon/honey, popsicles or ice, or OTC cold relief medicine for throat discomfort. You can also purchase chloraseptic spray at the pharmacy or dollar store.   For congestion: take a daily anti-histamine like Zyrtec, Claritin, and a oral decongestant, such as pseudoephedrine.  You can also use Flonase 1-2 sprays in each nostril daily. Afrin is also a good option, if you do not have high blood pressure.    It is important to stay hydrated: drink plenty of fluids (water, gatorade/powerade/pedialyte, juices, or teas) to keep your throat moisturized and help further relieve irritation/discomfort.    Return or go to the Emergency Department if symptoms worsen or do not improve in the next few days

## 2022-12-05 NOTE — ED Triage Notes (Signed)
Pt c/o congestion,cough & body aches ongoing x2 days. No known exposure, otc ibuprofen w/minor relief. Denies any fevers.

## 2022-12-06 ENCOUNTER — Telehealth: Payer: Self-pay

## 2022-12-06 NOTE — Telephone Encounter (Signed)
Per chart review tab  pt was seen at [facility] with + flu A test. Sending note to Dr Darnell Level and G pool.

## 2022-12-06 NOTE — Telephone Encounter (Signed)
[facility] Night - Client TELEPHONE ADVICE RECORD [facility] Patient Name: Jordan Buchanan Gender: Male DOB: 02-11-1971 Age: 51 Y 1 M 10 D Return Phone Number: [phone] (Primary), [phone] (Secondary) Address: City/ State/ Zip: [place] Night - Client Client Site [place] Provider Ria Bush - MD Contact Type Call Who Is Calling Patient / Member / Family / Caregiver Call Type Triage / Clinical Caller Name [name] Relationship To Patient Spouse Return Phone Number [phone] (Primary) Chief Complaint Flu Symptom Reason for Call Request to Schedule Office Appointment Initial Comment Caller states her husband has flu symptoms - body aches, fever (no thermometer), cough, nasal drainage. Additional Comment They are currently in [place] and does not know the zip code. But they are fixing to load up and head back to [place]. Translation No Nurse Assessment Nurse: Redmond Pulling, RN, Levada Dy Date/Time Eilene Ghazi Time): 12/05/2022 5:12:38 AM Confirm and document reason for call. If symptomatic, describe symptoms. ---Caller states he has body aches, fever (no thermometer), cough, nasal drainage, mild sore throat. Greenish/brown phlegm. Does the patient have any new or worsening symptoms? ---Yes Will a triage be completed? ---Yes Related visit to physician within the last 2 weeks? ---No Does the PT have any chronic conditions? (i.e. diabetes, asthma, this includes High risk factors for pregnancy, etc.) ---No Is this a behavioral health or substance abuse call? ---No Guidelines Guideline Title Affirmed Question Affirmed Notes Nurse Date/Time ([place] Time) COVID-19 - Diagnosed or Suspected MILD difficulty breathing (e.g., minimal/no SOB at rest, SOB with walking, pulse <100) Redmond Pulling, RN, Levada Dy 12/05/2022 5:14:37 AM PLEASE NOTE: All timestamps  contained within this report are represented as [place] Standard Time. CONFIDENTIALTY NOTICE: This fax transmission is intended only for the addressee. It contains information that is legally privileged, confidential or otherwise protected from use or disclosure. If you are not the intended recipient, you are strictly prohibited from reviewing, disclosing, copying using or disseminating any of this information or taking any action in reliance on or regarding this information. If you have received this fax in error, please notify us immediately by telephone so that we can arrange for its return to [place]. Phone: [phone], Toll-Free: [phone], Fax: [phone] Page: 2 of 2 Call Id: [id number] [place]. Time Eilene Ghazi Time) Disposition Final User 12/05/2022 5:18:24 AM See HCP within 4 Hours (or PCP triage) Yes Redmond Pulling, RN, Levada Dy Final Disposition 12/05/2022 5:18:24 AM See HCP within 4 Hours (or PCP triage) Yes Redmond Pulling, RN, Marin Shutter Disagree/Comply Comply Caller Understands Yes PreDisposition Did not know what to do Care Advice Given Per Guideline SEE HCP (OR PCP TRIAGE) WITHIN 4 HOURS: * IF OFFICE WILL BE OPEN: You need to be seen within the next 3 or 4 hours. Call your doctor (or NP/PA) now or as soon as the office opens. * IF OFFICE WILL BE CLOSED AND NO PCP (PRIMARY CARE PROVIDER) SECOND-LEVEL TRIAGE: You need to be seen within the next 3 or 4 hours. A nearby Urgent Care Center [facility]) is often a good source of care. Another choice is to go to the ED. Go sooner if you become worse. * Feeling dehydrated: Drink extra liquids. If the air in your home is dry, use a humidifier. CALL BACK IF: * You become worse CARE ADVICE given per COVID-19 - DIAGNOSED OR SUSPECTED (Adult) guideline. Comments User: Ivonne Andrew, RN Date/Time Eilene Ghazi Time): 12/05/2022 5:14:25 AM Covid test negative on Friday morning. User: Ivonne Andrew,  RN Date/Time Eilene Ghazi Time): 12/05/2022 5:19:12 AM Patient states he  may wait to see a HCP when he gets back home to [place], so he can see someone in network. Referrals GO TO FACILITY UNDECIDE

## 2022-12-06 NOTE — Telephone Encounter (Signed)
Noted. Treated with tamiflu.

## 2022-12-26 DIAGNOSIS — J209 Acute bronchitis, unspecified: Secondary | ICD-10-CM | POA: Diagnosis not present

## 2023-01-05 ENCOUNTER — Encounter: Payer: Self-pay | Admitting: Family Medicine

## 2023-01-05 ENCOUNTER — Ambulatory Visit: Payer: BC Managed Care – PPO | Admitting: Family Medicine

## 2023-01-05 VITALS — BP 122/80 | HR 76 | Temp 97.6°F | Ht 70.0 in | Wt 251.4 lb

## 2023-01-05 DIAGNOSIS — K219 Gastro-esophageal reflux disease without esophagitis: Secondary | ICD-10-CM

## 2023-01-05 DIAGNOSIS — J45991 Cough variant asthma: Secondary | ICD-10-CM

## 2023-01-05 DIAGNOSIS — R051 Acute cough: Secondary | ICD-10-CM

## 2023-01-05 DIAGNOSIS — R059 Cough, unspecified: Secondary | ICD-10-CM | POA: Insufficient documentation

## 2023-01-05 MED ORDER — ALBUTEROL SULFATE HFA 108 (90 BASE) MCG/ACT IN AERS: 2.0000 | INHALATION_SPRAY | RESPIRATORY_TRACT | 4 refills | Status: AC | PRN

## 2023-01-05 MED ORDER — PROMETHAZINE-DM 6.25-15 MG/5ML PO SYRP: 5.0000 mL | ORAL_SOLUTION | Freq: Four times a day (QID) | ORAL | 0 refills | Status: DC | PRN

## 2023-01-05 MED ORDER — BUDESONIDE-FORMOTEROL FUMARATE 160-4.5 MCG/ACT IN AERO: 1.0000 | INHALATION_SPRAY | Freq: Two times a day (BID) | RESPIRATORY_TRACT | 1 refills | Status: DC

## 2023-01-05 NOTE — Assessment & Plan Note (Signed)
History of this, reviewed difference between rescue and controller medication.  Recommend continue albuterol as needed, with Symbicort as per above. Discussed spacer use-he will start using inhalers with a spacer at home.

## 2023-01-05 NOTE — Progress Notes (Signed)
Patient ID: Jordan Buchanan, male    DOB: [birth year], 52 y.o.   MRN: [id number]  This visit was conducted in person.  BP 122/80   Pulse 76   Temp 97.6 F (36.4 C) (Temporal)   Ht '5\' 10"'$  (1.778 m)   Wt 251 lb 6 oz (114 kg)   SpO2 96%   BMI 36.07 kg/m    CC: f/u influenza Subjective:   HPI: Jordan Buchanan is a 52 y.o. male presenting on 01/05/2023 for Hospitalization Follow-up (Seen on 12/05/22 at [facility] ED, dx influenza. C/o ongoing cough.)   Seen 1 month ago at [facility] with + influenza A treated with tamiflu and phenergan cough syrup with good effect. He did initially feel better with this treatment however dry violent cough has persisted.   Seen last week by telehealth - dx bronchitis treated with prednisone '40mg'$  7d burst with initial improvement but symptoms recurred even while on steroids.  He's continued using albuterol inhaler PRN (about four times daily 1 puff) and symbicort with some benefit. Initially took symbicort 4 times daily, now down to twice daily Trouble sleeping due to cough, wakes up in the mornings with headache and R posterior neck pain worse with cough.  Cough worse as day goes on.  Ongoing upper chest tightness tightness.   Known cough variant asthma, GERD, perennial allergic rhinitis, OSA normally on zyrtec '10mg'$  daily, pepcid '20mg'$  nightly, pantoprazole '40mg'$  BID with carafate PRN.  Previous chronic cough 2019 saw allergist thought due to GERD.  Ex smoker.      Relevant past medical, surgical, family and social history reviewed and updated as indicated. Interim medical history since our last visit reviewed. Allergies and medications reviewed and updated. Outpatient Medications Prior to Visit  Medication Sig Dispense Refill   amLODipine (NORVASC) 10 MG tablet Take 1 tablet (10 mg total) by mouth daily. 90 tablet 3   cetirizine (ZYRTEC) 10 MG tablet Take 10 mg by mouth daily.     famotidine (PEPCID) 20 MG tablet Take 1 tablet (20 mg total) by mouth at  bedtime.     METAMUCIL FIBER PO Take by mouth daily.     metoprolol succinate (TOPROL-XL) 50 MG 24 hr tablet Take 1 tablet (50 mg total) by mouth daily. Take with or immediately following a meal. 90 tablet 3   Multiple Vitamins-Minerals (MULTIVITAMIN MEN PO) Take by mouth daily.     Omega-3 Fatty Acids (FISH OIL) 1000 MG CAPS Take 1 capsule (1,000 mg total) by mouth daily.  0   pantoprazole (PROTONIX) 40 MG tablet Take 40 mg by mouth 2 (two) times daily.      sucralfate (CARAFATE) 1 g tablet Take 1 g by mouth 4 (four) times daily.     Turmeric 500 MG CAPS Take 2 capsules by mouth daily.     albuterol (VENTOLIN HFA) 108 (90 Base) MCG/ACT inhaler Inhale 2 puffs into the lungs every 4 (four) hours as needed for wheezing or shortness of breath. 1 each 5   budesonide-formoterol (SYMBICORT) 160-4.5 MCG/ACT inhaler Inhale 2 puffs into the lungs in the morning and at bedtime.     mupirocin ointment (BACTROBAN) 2 % Apply 1 Application topically daily. With dressing changes 22 g 0   oseltamivir (TAMIFLU) 75 MG capsule Take 1 capsule (75 mg total) by mouth every 12 (twelve) hours. 10 capsule 0   promethazine-dextromethorphan (PROMETHAZINE-DM) 6.25-15 MG/5ML syrup Take 5 mLs by mouth 4 (four) times daily as needed. 118 mL 0   No  facility-administered medications prior to visit.     Per HPI unless specifically indicated in ROS section below Review of Systems  Objective:  BP 122/80   Pulse 76   Temp 97.6 F (36.4 C) (Temporal)   Ht '5\' 10"'$  (1.778 m)   Wt 251 lb 6 oz (114 kg)   SpO2 96%   BMI 36.07 kg/m   Wt Readings from Last 3 Encounters:  01/05/23 251 lb 6 oz (114 kg)  12/05/22 245 lb (111.1 kg)  05/10/22 257 lb (116.6 kg)      Physical Exam Vitals and nursing note reviewed.  Constitutional:      Appearance: Normal appearance. He is not ill-appearing.  HENT:     Head: Normocephalic and atraumatic.     Right Ear: Hearing, tympanic membrane, ear canal and external ear normal. There is no  impacted cerumen.     Left Ear: Hearing, tympanic membrane, ear canal and external ear normal. There is no impacted cerumen.     Nose: Nose normal. No mucosal edema, congestion or rhinorrhea.     Right Turbinates: Not enlarged or swollen.     Left Turbinates: Not enlarged or swollen.     Right Sinus: No maxillary sinus tenderness or frontal sinus tenderness.     Left Sinus: No maxillary sinus tenderness or frontal sinus tenderness.     Mouth/Throat:     Mouth: Mucous membranes are moist.     Pharynx: Oropharynx is clear. No oropharyngeal exudate or posterior oropharyngeal erythema.  Eyes:     Extraocular Movements: Extraocular movements intact.     Conjunctiva/sclera: Conjunctivae normal.     Pupils: Pupils are equal, round, and reactive to light.  Cardiovascular:     Rate and Rhythm: Normal rate and regular rhythm.     Pulses: Normal pulses.     Heart sounds: Normal heart sounds. No murmur heard. Pulmonary:     Effort: Pulmonary effort is normal. No respiratory distress.     Breath sounds: Normal breath sounds. No wheezing, rhonchi or rales.     Comments: Dry nagging bronchospastic cough present Musculoskeletal:     Cervical back: Normal range of motion and neck supple. No rigidity.     Right lower leg: No edema.     Left lower leg: No edema.  Lymphadenopathy:     Cervical: No cervical adenopathy.  Skin:    General: Skin is warm and dry.     Findings: No rash.  Neurological:     Mental Status: He is alert.  Psychiatric:        Mood and Affect: Mood normal.        Behavior: Behavior normal.       Results for orders placed or performed during the hospital encounter of 12/05/22  Resp panel by RT-PCR (RSV, Flu A&B, Covid) Anterior Nasal Swab   Specimen: Anterior Nasal Swab  Result Value Ref Range   SARS Coronavirus 2 by RT PCR NEGATIVE NEGATIVE   Influenza A by PCR POSITIVE (A) NEGATIVE   Influenza B by PCR NEGATIVE NEGATIVE   Resp Syncytial Virus by PCR NEGATIVE NEGATIVE     Assessment & Plan:   Problem List Items Addressed This Visit     GERD (gastroesophageal reflux disease)    Chronic, GERD seems well-controlled on PPI twice daily plus Pepcid nightly. Doubt current cough is related to GERD exacerbation.      Cough variant asthma     History of this, reviewed difference between rescue and controller medication.  Recommend continue albuterol as needed, with Symbicort as per above. Discussed spacer use-he will start using inhalers with a spacer at home.      Relevant Medications   albuterol (VENTOLIN HFA) 108 (90 Base) MCG/ACT inhaler   budesonide-formoterol (SYMBICORT) 160-4.5 MCG/ACT inhaler   Cough - Primary    Ongoing persistent cough after influenza A diagnosed 1 month ago.  Lungs clear, no fever, no signs of ongoing bacterial infection.  Anticipate post viral inflammatory cough.  Will Rx phenergan cough syrup which was previously effective.  In h/o asthma, will continue albuterol rescue inhaler PRN and also Rx symbicort 2 puffs BID x 1 wk then drop to 1 puff BID until cough resolution. Update if not improved with this. Pt agrees with plan.         Meds ordered this encounter  Medications   promethazine-dextromethorphan (PROMETHAZINE-DM) 6.25-15 MG/5ML syrup    Sig: Take 5 mLs by mouth 4 (four) times daily as needed.    Dispense:  118 mL    Refill:  0   albuterol (VENTOLIN HFA) 108 (90 Base) MCG/ACT inhaler    Sig: Inhale 2 puffs into the lungs every 4 (four) hours as needed for wheezing or shortness of breath. Rescue inhaler    Dispense:  8 each    Refill:  4   budesonide-formoterol (SYMBICORT) 160-4.5 MCG/ACT inhaler    Sig: Inhale 1-2 puffs into the lungs in the morning and at bedtime.    Dispense:  10.2 g    Refill:  1    No orders of the defined types were placed in this encounter.   Patient Instructions  I think you have residual inflammation in lungs after influenza causing persistent nagging cough. Treat with phenergan  cough syrup as needed, ok to continue albuterol rescue inhaler as needed, take symbicort controller inhaler 2 puff twice daily for 1 week then drop to 1 puff twice daily then off once breathing is back to normal without cough.  Let us know if fever >101, worsening productive cough or new symptoms develop  Follow up plan: Return if symptoms worsen or fail to improve.  Ria Bush, MD

## 2023-01-05 NOTE — Assessment & Plan Note (Addendum)
Ongoing persistent cough after influenza A diagnosed 1 month ago.  Lungs clear, no fever, no signs of ongoing bacterial infection.  Anticipate post viral inflammatory cough.  Will Rx phenergan cough syrup which was previously effective.  In h/o asthma, will continue albuterol rescue inhaler PRN and also Rx symbicort 2 puffs BID x 1 wk then drop to 1 puff BID until cough resolution. Update if not improved with this. Pt agrees with plan.

## 2023-01-05 NOTE — Assessment & Plan Note (Signed)
Chronic, GERD seems well-controlled on PPI twice daily plus Pepcid nightly. Doubt current cough is related to GERD exacerbation.

## 2023-01-05 NOTE — Patient Instructions (Signed)
I think you have residual inflammation in lungs after influenza causing persistent nagging cough. Treat with phenergan cough syrup as needed, ok to continue albuterol rescue inhaler as needed, take symbicort controller inhaler 2 puff twice daily for 1 week then drop to 1 puff twice daily then off once breathing is back to normal without cough.  Let us know if fever >101, worsening productive cough or new symptoms develop

## 2023-01-09 ENCOUNTER — Ambulatory Visit
Admission: EM | Admit: 2023-01-09 | Discharge: 2023-01-09 | Disposition: A | Discharge status: Home / Self Care | Payer: BC Managed Care – PPO | Attending: Urgent Care | Admitting: Urgent Care

## 2023-01-09 DIAGNOSIS — J45901 Unspecified asthma with (acute) exacerbation: Secondary | ICD-10-CM

## 2023-01-09 DIAGNOSIS — R052 Subacute cough: Secondary | ICD-10-CM

## 2023-01-09 MED ORDER — PREDNISONE 20 MG PO TABS: ORAL_TABLET | ORAL | 0 refills | Status: AC

## 2023-01-09 NOTE — ED Provider Notes (Signed)
Jordan Buchanan    CSN: [id number] Arrival date & time: 01/09/23  1151      History   Chief Complaint Chief Complaint  Patient presents with   Cough   Shortness of Breath    HPI Jordan Buchanan is a 52 y.o. male.    Cough Associated symptoms: shortness of breath   Shortness of Breath Associated symptoms: cough     Patient presents to urgent care with complaint of dry cough, shortness of breath and a "choking" sensation, when he breathes.  He states he has been enduring the symptoms since December 16 after flu diagnosis.  He reports being evaluated by his PCP 4 days ago for these symptoms but reports no relief since that visit.  Visit notes from 01/05/2023 indicated evaluation of ongoing persistent cough following influenza A.  His provider found his lungs clear with no sign of bacterial infection and diagnosed post viral cough.  Patient has history of asthma with albuterol.  Provider ordered Promethazine DM and Symbicort.  Patient states he is using Symbicort as directed though he is somewhat timid about using it as he has been previously told to use it too frequently.  He reports using it as needed up to 4 times a day.  He reports infrequent use of albuterol though does not notice an improvement when he uses it.  Past Medical History:  Diagnosis Date   Acute calculous cholecystitis s/p lap cholecystectomy 04/30/2020 04/30/2020   Arrhythmia    PALPITATIONS AND PVC - improved with cutting down on caffeine   Cough variant asthma 05/28/2014   05/27/2014 p extensive coaching HFA effectiveness =    75% > try dulera 100 2bid  - PFT's 07/03/2014 FEV1 4.15 (98%) ratio 89 and no chang p B2 and nl fef25-75  And nl dlco     COVID-19 virus infection 11/2020   Diverticulitis of large intestine with perforation and abscess s/p lap drainage & washout 04/30/2020 04/13/2020   Diverticulosis 07/2015   by CT scan   Dysplastic nevus 05/15/2020   Left lat. base of neck. Moderate atypia, lateral  margin involved.    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   RUQ abdominal pain 05/04/2021   Diverticulitis left colon s/p robotic rectosigmoid resection LAR 06/17/2020 06/17/2020   Acute calculous cholecystitis s/p lap chole 04/2020 04/30/2020   Diverticulitis of large intestine with perforation and abscess s/p lap drainage & washout 04/30/2020 04/13/2020   Incontinence of feces 08/27/2019   Health maintenance examination 09/08/2018   Chest pain 07/04/2018   Urticaria 04/24/2018   Skin rash 04/24/2018   Family history of multiple sclerosis 04/27/2017   Former smoker 04/27/2017   Spondyloarthropathy    OSA (obstructive sleep apnea) 08/19/2016   Low back pain radiating to left lower extremity 08/06/2016   Severe obesity (BMI 35.0-39.9) with comorbidity ([place]) 04/21/2016   Cough, persistent 03/10/2016   Cough variant asthma  05/28/2014   Pruritic condition 04/18/2014   Hepatic steatosis  Migraines    Perennial allergic rhinitis    GERD (gastroesophageal reflux disease)    Hypertension     Past Surgical History:  Procedure Laterality Date   CHOLECYSTECTOMY N/A 04/30/2020   Procedure: LAPAROSCOPIC CHOLECYSTECTOMY WITH INTRAOPERATIVE CHOLANGIOGRAM, BILATERAL TAP BLOCK;  Surgeon: Michael Boston, MD;  Location: [facility];  Service: General;  Laterality: N/A;   ESOPHAGOGASTRODUODENOSCOPY  08/2018   esophagus dilated, biopsy showed chronic gastritis with mod reflux changes ([place])   LAPAROSCOPY N/A 04/30/2020   Procedure: LAPAROSCOPY DIAGNOSTIC WITH  WASHOUT AND DRAINAGE PLACEMENT, DRAINAGE OF DIVERTICULITIS ABSCESS;  Surgeon: Michael Boston, MD;  Location: [facility];  Service: General;  Laterality: N/A;   NASAL RECONSTRUCTION WITH SEPTAL REPAIR  2017   [place]   OTHER SURGICAL HISTORY  1992   facial surgery   PROCTOSCOPY N/A 06/17/2020   Procedure: RIGID PROCTOSCOPY;  Surgeon: Michael Boston, MD;  Location: [facility];  Service: General;  Laterality: N/A;   ROBOTIC LOW ANTERIOR RECTOSIGMOID RESECTION  06/17/2020   for recurrent L sided diverticulitis ([place])   ROTATOR CUFF REPAIR  2009   left   SINOSCOPY     UMBILICAL HERNIA REPAIR  05/10/2012   Procedure: HERNIA REPAIR UMBILICAL ADULT;  Surgeon: Harl Bowie, MD;  Location: [place];  Service: General;  Laterality: N/A;       Home Medications    Prior to Admission medications   Medication Sig Start Date End Date Taking? Authorizing Provider  albuterol (VENTOLIN HFA) 108 (90 Base) MCG/ACT inhaler Inhale 2 puffs into the lungs every 4 (four) hours as needed for wheezing or shortness of breath. Rescue inhaler 01/05/23   Ria Bush, MD  amLODipine (NORVASC) 10 MG tablet Take 1 tablet (10 mg total) by mouth daily. 05/10/22   Ria Bush, MD  budesonide-formoterol [facility]) 160-4.5 MCG/ACT inhaler Inhale 1-2 puffs into the lungs in the morning and at bedtime. 01/05/23   Ria Bush, MD  cetirizine (ZYRTEC) 10 MG tablet Take 10 mg by mouth daily.    [provider]  famotidine (PEPCID) 20 MG tablet Take 1 tablet (20 mg total) by mouth at bedtime. 05/10/22   Ria Bush, MD  METAMUCIL FIBER PO Take by mouth daily.    [provider]  metoprolol succinate (TOPROL-XL) 50 MG 24 hr tablet Take 1 tablet (50 mg total) by mouth daily. Take with or immediately following a meal. 05/10/22   Ria Bush, MD  Multiple Vitamins-Minerals (MULTIVITAMIN MEN PO) Take by mouth daily.    [provider]  Omega-3 Fatty Acids (FISH OIL) 1000  MG CAPS Take 1 capsule (1,000 mg total) by mouth daily. 05/10/22   Ria Bush, MD  pantoprazole (PROTONIX) 40 MG tablet Take 40 mg by mouth 2 (two) times daily.  09/06/19 01/05/23  [provider]  promethazine-dextromethorphan (PROMETHAZINE-DM) 6.25-15 MG/5ML syrup Take 5 mLs by mouth 4 (four) times daily as needed. 01/05/23   Ria Bush, MD  sucralfate (CARAFATE) 1 g tablet Take 1 g by mouth 4 (four) times daily. 05/30/21   [provider]  Turmeric 500 MG CAPS Take 2 capsules by mouth daily. 05/10/22   Ria Bush, MD    Family History Family History  Problem Relation Age of Onset   Asthma Father    Arthritis Father        father, brother, Pgrandfather   Chronic bronchitis Father    Allergies Father    Hypertension Brother        obese   CAD Paternal Grandfather  MI x2   Multiple sclerosis Brother    Stroke Neg Hx    Cancer Neg Hx     Social History Social History   Tobacco Use   Smoking status: Former    Packs/day: 1.00    Years: 13.00    Total pack years: 13.00    Types: Cigarettes    Quit date: 12/21/1999    Years since quitting: 23.0   Smokeless tobacco: Former    Types: Snuff    Quit date: 12/20/2013  Vaping Use   Vaping Use: Never used  Substance Use Topics   Alcohol use: No    Alcohol/week: 0.0 standard drinks of alcohol    Comment: quit 2002   Drug use: Not Currently    Types: Marijuana    Comment: quit 2002     Allergies   Hctz [hydrochlorothiazide]   Review of Systems Review of Systems  Respiratory:  Positive for cough and shortness of breath.      Physical Exam Triage Vital Signs ED Triage Vitals [01/09/23 1339]  Enc Vitals Group     BP (!) 136/91     Pulse Rate 86     Resp 18     Temp 98.5 F (36.9 C)     Temp Source Oral     SpO2 95 %     Weight      Height      Head Circumference      Peak Flow      Pain Score      Pain Loc      Pain Edu?      Excl. in [place]?    No data found.  Updated  Vital Signs BP (!) 136/91 (BP Location: Left Arm)   Pulse 86   Temp 98.5 F (36.9 C) (Oral)   Resp 18   SpO2 95%   Visual Acuity Right Eye Distance:   Left Eye Distance:   Bilateral Distance:    Right Eye Near:   Left Eye Near:    Bilateral Near:     Physical Exam Vitals reviewed.  Constitutional:      Appearance: He is well-developed.  Pulmonary:     Effort: Pulmonary effort is normal.     Breath sounds: Decreased breath sounds present. No wheezing, rhonchi or rales.  Skin:    General: Skin is warm and dry.  Neurological:     General: No focal deficit present.     Mental Status: He is alert and oriented to person, place, and time.  Psychiatric:        Mood and Affect: Mood normal.        Behavior: Behavior normal.      UC Treatments / Results  Labs (all labs ordered are listed, but only abnormal results are displayed) Labs Reviewed - No data to display  EKG   Radiology No results found.  Procedures Procedures (including critical care time)  Medications Ordered in UC Medications - No data to display  Initial Impression / Assessment and Plan / UC Course  I have reviewed the triage vital signs and the nursing notes.  Pertinent labs & imaging results that were available during my care of the patient were reviewed by me and considered in my medical decision making (see chart for details).   Patient is afebrile here without recent antipyretics. Satting well on room air. Overall is well appearing, well hydrated, without respiratory distress. Pulmonary exam is remarkable only for possibly decreased breath sounds, though Lungs CTAB  without wheezing, rhonchi, rales.  Moderately severe cough is dry without any production of sputum, triggered by deep breathing.  Suspect exacerbation of asthma versus postviral cough syndrome.  He has been treated recently with a course of prednisone and endorses some improvement in symptoms after that.  He has not noticed any  improvement with Symbicort or albuterol and suggested another course of prednisone though I presented this reluctantly.  I do not suspect any bacterial involvement given his lungs are clear but concerned for decreased breath sounds possibly due to bronchial constriction.  Final Clinical Impressions(s) / UC Diagnoses   Final diagnoses:  None   Discharge Instructions   None    ED Prescriptions   None    PDMP not reviewed this encounter.   Rose Phi, [place] 01/09/23 1406

## 2023-01-09 NOTE — Discharge Instructions (Signed)
Follow up with your primary care provider if your symptoms are worsening or not improving.    

## 2023-01-09 NOTE — ED Triage Notes (Signed)
Pt. Presents to UC w/ c/o a dry cough, SOB and a "choking" sensation as he breathes. Pt. States these symptoms have been present since Dec.16th after being diagnosed with the FLU. Pt. Also states he was seen by his PCP Wednesday for treatment of the symptoms but they have not resolved.

## 2023-01-11 ENCOUNTER — Encounter: Payer: Self-pay | Admitting: Family Medicine

## 2023-01-11 MED ORDER — AMOXICILLIN-POT CLAVULANATE 875-125 MG PO TABS: 1.0000 | ORAL_TABLET | Freq: Two times a day (BID) | ORAL | 0 refills | Status: AC

## 2023-01-11 MED ORDER — PROMETHAZINE-DM 6.25-15 MG/5ML PO SYRP: 5.0000 mL | ORAL_SOLUTION | Freq: Four times a day (QID) | ORAL | 0 refills | Status: DC | PRN

## 2023-01-11 NOTE — Telephone Encounter (Signed)
Spoke with patient on phone.  Worsening productive cough with head/nasal > chest congestion.  [facility] records reviewed. Recently treated with another prednisone taper.  Doesn't feel improvement with symbicort.  No GERD symptoms - he continues carafate, pepcid, PPI.   Continues using phenergan cough syrup. Refilled today.  Will Rx augmentin to cover possible sinusitis.  Update with effect.

## 2023-01-12 MED ORDER — NYSTATIN 100000 UNIT/ML MT SUSP: 5.0000 mL | Freq: Four times a day (QID) | OROMUCOSAL | 0 refills | Status: DC

## 2023-01-12 NOTE — Addendum Note (Signed)
Addended by: Ria Bush on: 01/12/2023 02:06 PM   Modules accepted: Orders

## 2023-01-27 ENCOUNTER — Encounter: Payer: Self-pay | Admitting: Pulmonary Disease

## 2023-01-27 ENCOUNTER — Ambulatory Visit (INDEPENDENT_AMBULATORY_CARE_PROVIDER_SITE_OTHER): Payer: BC Managed Care – PPO

## 2023-01-27 ENCOUNTER — Ambulatory Visit: Payer: BC Managed Care – PPO | Admitting: Pulmonary Disease

## 2023-01-27 VITALS — BP 146/90 | HR 77 | Temp 98.1°F | Ht 70.0 in | Wt 258.2 lb

## 2023-01-27 DIAGNOSIS — J101 Influenza due to other identified influenza virus with other respiratory manifestations: Secondary | ICD-10-CM

## 2023-01-27 DIAGNOSIS — J45991 Cough variant asthma: Secondary | ICD-10-CM | POA: Diagnosis not present

## 2023-01-27 DIAGNOSIS — K219 Gastro-esophageal reflux disease without esophagitis: Secondary | ICD-10-CM | POA: Diagnosis not present

## 2023-01-27 DIAGNOSIS — R059 Cough, unspecified: Secondary | ICD-10-CM | POA: Diagnosis not present

## 2023-01-27 LAB — POCT EXHALED NITRIC OXIDE: FeNO level (ppb): 6

## 2023-01-27 NOTE — Patient Instructions (Signed)
Multifactorial chronic cough: Exhaled nitric oxide test today Chest x-ray today  Gastroesophageal reflux disease: Keep taking Protonix twice daily Avoid fatty foods, alcohol, chocolate, caffeine and tobacco products No eating within 3 hours of bedtime  Asthmatic bronchitis: Continue Symbicort 1 puffs twice daily for the next 3 weeks, then take Symbicort 1 puff daily for 3 weeks then stop Exhaled nitric oxide test Let us know if the cough worsens  The laryngeal irritation due to multifactorial upper airway and reflux related cough: You need to try to suppress your cough to allow your larynx (voice box) to heal.  For three days don't talk, laugh, sing, or clear your throat. Do everything you can to suppress the cough during this time. Use hard candies (sugarless [name] Ranchers) or non-mint or non-menthol containing cough drops during this time to soothe your throat.  Use a cough suppressant (Delsym or what I have prescribed you) around the clock during this time.  After three days, gradually increase the use of your voice and back off on the cough suppressants.  We will see you back in 4 to 6 weeks to make sure things are headed in the right direction, sooner if needed

## 2023-01-27 NOTE — Progress Notes (Signed)
Synopsis: Referred in 01/2023 for Cough. Has a history of cough variant asthma, GERD, former cigarette smoker. Has a history of perforated diverticulitis, acute cholecystitis.  Had somewhat complicated intra-abdominal infection in 2021.  Subjective:   PATIENT ID: Jordan Buchanan GENDER: male DOB: 1971/01/18, MRN: [id number]   HPI  Chief Complaint  Patient presents with   Consult    Cough, pt says cough is getting better from antibiotics and steroids     Jordan Buchanan: > started December 16 when he had flu > he tested positive for Flu A > after the flu he had the cough that persisted.  This was associated with dyspnea, felt "restriction" I his chest.  He was seen by urgent care and was told that he wasn't moving air well by an Urgent Care provider.  > he was treated with antibiotics then as well as a few weeks later by Jordan Buchanan > he had a forceful, violent cough that persisteted for weeks  he was eating cough drops continuously, taking honey > he finished his most recent bout of steroids and antibiotics  > he is now feeling better.   > he saw Jordan Buchanan years ago and was told that he had  > initially he didn't have any sinus congestion when he caught the flu, but then later in the month he had a productive cough and sinus congestion.   He's been prescribed Dulera by Jordan Buchanan for asthmatic bronchitis and cough related to post nasal drip and GERD.  He's been on sucralfate and bid protonix for several years.  He sues the sucralfate lately to try to keep the reflux under control.   Father has asthma He has had pneumonia, once in the 4th grade, another in his 64's.  Not hospitalized Record review: January 05, 2023 visit with Jordan Buchanan reviewed where the patient was seen in the context of cough.  He had had influenza A pneumonia 1 month prior.  Had a persistent cough.  Was seen again by a televisit and prednisone and Symbicort was prescribed.  Still  had significant cough when he saw Jordan Buchanan.  Phenergan based cough syrup prescribed.   Past Medical History:  Diagnosis Date   Acute calculous cholecystitis s/p lap cholecystectomy 04/30/2020 04/30/2020   Arrhythmia    PALPITATIONS AND PVC - improved with cutting down on caffeine   Cough variant asthma 05/28/2014   05/27/2014 p extensive coaching HFA effectiveness =    75% > try dulera 100 2bid  - PFT's 07/03/2014 FEV1 4.15 (98%) ratio 89 and no chang p B2 and nl fef25-75  And nl dlco     COVID-19 virus infection 11/2020   Diverticulitis of large intestine with perforation and abscess s/p lap drainage & washout 04/30/2020 04/13/2020   Diverticulosis 07/2015   by CT scan   Dysplastic nevus 05/15/2020   Left lat. base of neck. Moderate atypia, lateral margin involved.    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 Estanislado Pandy)   Tremor    Urticaria      Family History  Problem Relation Age of Onset   Asthma Father    Arthritis  Father        father, brother, Pgrandfather   Chronic bronchitis Father    Allergies Father    Hypertension Brother        obese   CAD Paternal Grandfather        MI x2   Multiple sclerosis Brother    Stroke Neg Hx    Cancer Neg Hx      Social History   Socioeconomic History   Marital status: Married    Spouse name: Not on file   Number of children: 2   Years of education: Not on file   Highest education level: Not on file  Occupational History   Occupation: golf course maintenance    Employer: [employer]  Tobacco Use   Smoking status: Former    Packs/day: 1.00    Years: 13.00    Total pack years: 13.00    Types: Cigarettes    Quit date: 12/21/1999    Years since  quitting: 23.1   Smokeless tobacco: Former    Types: Snuff    Quit date: 12/20/2013  Vaping Use   Vaping Use: Never used  Substance and Sexual Activity   Alcohol use: No    Alcohol/week: 0.0 standard drinks of alcohol    Comment: quit 2002   Drug use: Not Currently    Types: Marijuana    Comment: quit 2002   Sexual activity: Not on file  Other Topics Concern   Not on file  Social History Narrative   Lives with wife and 2 daughters, 1 dog   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 [employer]   Edu: HS   Activity: work stays active, some bike riding   Diet: good water, fruits/vegetables some   Social Determinants of Radio broadcast assistant Strain: Not on file  Food Insecurity: Not on file  Transportation Needs: Not on file  Physical Activity: Not on file  Stress: Not on file  Social Connections: Not on file  Intimate Partner Violence: Not on file     Allergies  Allergen Reactions   Hctz [Hydrochlorothiazide] Rash    Per derm rec stay off HCTZ (2018)     Outpatient Medications Prior to Visit  Medication Sig Dispense Refill   albuterol (VENTOLIN HFA) 108 (90 Base) MCG/ACT inhaler Inhale 2 puffs into the lungs every 4 (four) hours as needed for wheezing or shortness of breath. Rescue inhaler 8 each 4   amLODipine (NORVASC) 10 MG tablet Take 1 tablet (10 mg total) by mouth daily. 90 tablet 3   budesonide-formoterol (SYMBICORT) 160-4.5 MCG/ACT inhaler Inhale 1-2 puffs into the lungs in the morning and at bedtime. 10.2 g 1   cetirizine (ZYRTEC) 10 MG tablet Take 10 mg by mouth daily.     famotidine (PEPCID) 20 MG tablet Take 1 tablet (20 mg total) by mouth at bedtime.     METAMUCIL FIBER PO Take by mouth daily.     metoprolol succinate (TOPROL-XL) 50 MG 24 hr tablet Take 1 tablet (50 mg total) by mouth daily. Take with or immediately following a meal. 90 tablet 3   Multiple Vitamins-Minerals (MULTIVITAMIN MEN PO) Take by mouth daily.     nystatin (MYCOSTATIN) 100000 UNIT/ML suspension  Take 5 mLs (500,000 Units total) by mouth 4 (four) times daily. 100 mL 0   Omega-3 Fatty Acids (FISH OIL) 1000 MG CAPS Take 1 capsule (1,000 mg total) by mouth daily.  0   promethazine-dextromethorphan (PROMETHAZINE-DM) 6.25-15 MG/5ML syrup Take 5 mLs by mouth  4 (four) times daily as needed. 118 mL 0   sucralfate (CARAFATE) 1 g tablet Take 1 g by mouth 4 (four) times daily.     Turmeric 500 MG CAPS Take 2 capsules by mouth daily.     pantoprazole (PROTONIX) 40 MG tablet Take 40 mg by mouth 2 (two) times daily.      No facility-administered medications prior to visit.    Review of Systems  Constitutional:  Negative for chills, fever, malaise/fatigue and weight loss.  HENT:  Negative for congestion, nosebleeds, sinus pain and sore throat.   Eyes:  Negative for photophobia, pain and discharge.  Respiratory:  Positive for cough. Negative for hemoptysis, sputum production, shortness of breath and wheezing.   Cardiovascular:  Negative for chest pain, palpitations, orthopnea and leg swelling.  Gastrointestinal:  Negative for abdominal pain, constipation, diarrhea, nausea and vomiting.  Genitourinary:  Negative for dysuria, frequency, hematuria and urgency.  Musculoskeletal:  Negative for back pain, joint pain, myalgias and neck pain.  Skin:  Negative for itching and rash.  Neurological:  Negative for tingling, tremors, sensory change, speech change, focal weakness, seizures, weakness and headaches.  Psychiatric/Behavioral:  Negative for memory loss, substance abuse and suicidal ideas. The patient is not nervous/anxious.       Objective:  Physical Exam   Vitals:   01/27/23 0836  BP: (!) 146/90  Pulse: 77  Temp: 98.1 F (36.7 C)  TempSrc: Oral  SpO2: 98%  Weight: 258 lb 3.2 oz (117.1 kg)  Height: 5' 10"$  (1.778 m)    Gen: well appearing, no acute distress HENT: NCAT, OP clear, neck supple without masses Eyes: PERRL, EOMi Lymph: no cervical lymphadenopathy PULM: CTA B CV: RRR, no  mgr, no JVD GI: BS+, soft, nontender, no hsm Derm: no rash or skin breakdown MSK: normal bulk and tone Neuro: A&Ox4, CN II-XII intact, strength 5/5 in all 4 extremities Psyche: normal mood and affect   CBC    Component Value Date/Time   WBC 7.4 05/03/2022 0730   RBC 5.14 05/03/2022 0730   HGB 15.6 05/03/2022 0730   HCT 45.0 05/03/2022 0730   PLT 241.0 05/03/2022 0730   MCV 87.6 05/03/2022 0730   MCH 29.8 01/25/2022 2021   MCHC 34.7 05/03/2022 0730   RDW 13.6 05/03/2022 0730   LYMPHSABS 2.3 05/03/2022 0730   MONOABS 0.5 05/03/2022 0730   EOSABS 0.2 05/03/2022 0730   BASOSABS 0.0 05/03/2022 0730     Chest imaging: February 2023 two-view chest x-ray images independently reviewed showing normal pulmonary parenchyma, no infiltrate  PFT: - PFT's 07/03/2014 FEV1 4.15 (98%) ratio 89 and no chang p B2 and nl fef25-75  And nl dlco  - NO during flare 04/12/2016  = 18 - Spirometry 04/20/2016  wnl including fef 25-75  Labs: - Allergy profile 04/12/2016 >  Eos 0.2 /  IgE  25 neg RAST   Path:  Echo:  Heart Catheterization:       Assessment & Plan:   Cough variant asthma  - Plan: DG Chest 2 View, POCT EXHALED NITRIC OXIDE  Gastroesophageal reflux disease, unspecified whether esophagitis present  Influenza A  Discussion: Stamatios comes to clinic today for evaluation of cough which has been persistent ever since he had the flu.  The reason why he has been coughing so much is because he has reflux, asthmatic bronchitis, and had a severe airway infection with influenza.  He is now recovered after multiple rounds of antibiotics, steroids and taking Symbicort.  I told  him that he needs to keep taking the Symbicort for another 6 weeks.  He should also continue taking his acid reflux treatment.  Because of the extensive workup he has had in the past I do not really see a good reason to order too many test.  However, because of his smoking history we need to get a chest x-ray.  Will also get  an exhaled nitric oxide test today given his history of asthmatic bronchitis.  I think he should continue to improve.  Plan: Multifactorial chronic cough: Exhaled nitric oxide test today Chest x-ray today  Gastroesophageal reflux disease: Keep taking Protonix twice daily Avoid fatty foods, alcohol, chocolate, caffeine and tobacco products No eating within 3 hours of bedtime  Asthmatic bronchitis: Continue Symbicort 1 puffs twice daily for the next 3 weeks, then take Symbicort 1 puff daily for 3 weeks then stop Exhaled nitric oxide test Let us know if the cough worsens  The laryngeal irritation due to multifactorial upper airway and reflux related cough: You need to try to suppress your cough to allow your larynx (voice box) to heal.  For three days don't talk, laugh, sing, or clear your throat. Do everything you can to suppress the cough during this time. Use hard candies (sugarless [name] Ranchers) or non-mint or non-menthol containing cough drops during this time to soothe your throat.  Use a cough suppressant (Delsym or what I have prescribed you) around the clock during this time.  After three days, gradually increase the use of your voice and back off on the cough suppressants.  We will see you back in 4 to 6 weeks to make sure things are headed in the right direction, sooner if needed  Immunizations: Immunization History  Administered Date(s) Administered   Influenza Whole 09/30/2011, 10/05/2012   Influenza,inj,Quad PF,6+ Mos 09/17/2013, 10/04/2014, 01/31/2018, 09/08/2018, 08/24/2019, 11/04/2021   Influenza-Unspecified 09/30/2011, 10/05/2012, 09/19/2014, 10/30/2015, 10/01/2016   PFIZER(Purple Top)SARS-COV-2 Vaccination 03/06/2020, 04/01/2020   Tdap 09/08/2018     Current Outpatient Medications:    albuterol (VENTOLIN HFA) 108 (90 Base) MCG/ACT inhaler, Inhale 2 puffs into the lungs every 4 (four) hours as needed for wheezing or shortness of breath. Rescue inhaler, Disp: 8  each, Rfl: 4   amLODipine (NORVASC) 10 MG tablet, Take 1 tablet (10 mg total) by mouth daily., Disp: 90 tablet, Rfl: 3   budesonide-formoterol (SYMBICORT) 160-4.5 MCG/ACT inhaler, Inhale 1-2 puffs into the lungs in the morning and at bedtime., Disp: 10.2 g, Rfl: 1   cetirizine (ZYRTEC) 10 MG tablet, Take 10 mg by mouth daily., Disp: , Rfl:    famotidine (PEPCID) 20 MG tablet, Take 1 tablet (20 mg total) by mouth at bedtime., Disp: , Rfl:    METAMUCIL FIBER PO, Take by mouth daily., Disp: , Rfl:    metoprolol succinate (TOPROL-XL) 50 MG 24 hr tablet, Take 1 tablet (50 mg total) by mouth daily. Take with or immediately following a meal., Disp: 90 tablet, Rfl: 3   Multiple Vitamins-Minerals (MULTIVITAMIN MEN PO), Take by mouth daily., Disp: , Rfl:    nystatin (MYCOSTATIN) 100000 UNIT/ML suspension, Take 5 mLs (500,000 Units total) by mouth 4 (four) times daily., Disp: 100 mL, Rfl: 0   Omega-3 Fatty Acids (FISH OIL) 1000 MG CAPS, Take 1 capsule (1,000 mg total) by mouth daily., Disp: , Rfl: 0   promethazine-dextromethorphan (PROMETHAZINE-DM) 6.25-15 MG/5ML syrup, Take 5 mLs by mouth 4 (four) times daily as needed., Disp: 118 mL, Rfl: 0   sucralfate (CARAFATE) 1 g tablet,  Take 1 g by mouth 4 (four) times daily., Disp: , Rfl:    Turmeric 500 MG CAPS, Take 2 capsules by mouth daily., Disp: , Rfl:    pantoprazole (PROTONIX) 40 MG tablet, Take 40 mg by mouth 2 (two) times daily. , Disp: , Rfl:

## 2023-02-27 ENCOUNTER — Other Ambulatory Visit: Payer: Self-pay | Admitting: Family Medicine

## 2023-02-27 DIAGNOSIS — J45991 Cough variant asthma: Secondary | ICD-10-CM

## 2023-03-01 NOTE — Telephone Encounter (Signed)
Symbicort Last filled:  02/01/23, #10.2 g Last OV:  01/05/23, ER f/u Next OV:  none

## 2023-03-01 NOTE — Telephone Encounter (Signed)
ERx 

## 2023-04-20 DIAGNOSIS — J4531 Mild persistent asthma with (acute) exacerbation: Secondary | ICD-10-CM | POA: Diagnosis not present

## 2023-04-21 ENCOUNTER — Other Ambulatory Visit: Payer: Self-pay | Admitting: Family Medicine

## 2023-04-21 DIAGNOSIS — I10 Essential (primary) hypertension: Secondary | ICD-10-CM

## 2023-04-21 NOTE — Telephone Encounter (Signed)
Patient scheduled.

## 2023-04-21 NOTE — Telephone Encounter (Signed)
E-scribed refill  Plz schedule CPE and fasting labs to prevent delays in future refills.

## 2023-04-21 NOTE — Telephone Encounter (Signed)
Noted  

## 2023-04-30 DIAGNOSIS — M659 Synovitis and tenosynovitis, unspecified: Secondary | ICD-10-CM | POA: Diagnosis not present

## 2023-05-02 DIAGNOSIS — H1132 Conjunctival hemorrhage, left eye: Secondary | ICD-10-CM | POA: Diagnosis not present

## 2023-05-02 DIAGNOSIS — H10412 Chronic giant papillary conjunctivitis, left eye: Secondary | ICD-10-CM | POA: Diagnosis not present

## 2023-05-02 DIAGNOSIS — D485 Neoplasm of uncertain behavior of skin: Secondary | ICD-10-CM | POA: Diagnosis not present

## 2023-05-04 ENCOUNTER — Other Ambulatory Visit (HOSPITAL_BASED_OUTPATIENT_CLINIC_OR_DEPARTMENT_OTHER): Payer: Self-pay

## 2023-05-04 ENCOUNTER — Emergency Department (HOSPITAL_BASED_OUTPATIENT_CLINIC_OR_DEPARTMENT_OTHER)
Admission: EM | Admit: 2023-05-04 | Discharge: 2023-05-04 | Disposition: A | Discharge status: Home / Self Care | Payer: BC Managed Care – PPO | Attending: Emergency Medicine | Admitting: Emergency Medicine

## 2023-05-04 ENCOUNTER — Other Ambulatory Visit: Payer: Self-pay

## 2023-05-04 ENCOUNTER — Emergency Department (HOSPITAL_BASED_OUTPATIENT_CLINIC_OR_DEPARTMENT_OTHER): Payer: BC Managed Care – PPO

## 2023-05-04 DIAGNOSIS — X509XXA Other and unspecified overexertion or strenuous movements or postures, initial encounter: Secondary | ICD-10-CM | POA: Insufficient documentation

## 2023-05-04 DIAGNOSIS — R0781 Pleurodynia: Secondary | ICD-10-CM | POA: Diagnosis not present

## 2023-05-04 DIAGNOSIS — R0789 Other chest pain: Secondary | ICD-10-CM

## 2023-05-04 LAB — CBC WITH DIFFERENTIAL/PLATELET
Abs Immature Granulocytes: 0.08 10*3/uL — ABNORMAL HIGH (ref 0.00–0.07)
Basophils Absolute: 0.1 10*3/uL (ref 0.0–0.1)
Basophils Relative: 1 %
Eosinophils Absolute: 0.3 10*3/uL (ref 0.0–0.5)
Eosinophils Relative: 3 %
HCT: 46.6 % (ref 39.0–52.0)
Hemoglobin: 16.3 g/dL (ref 13.0–17.0)
Immature Granulocytes: 1 %
Lymphocytes Relative: 27 %
Lymphs Abs: 2.5 10*3/uL (ref 0.7–4.0)
MCH: 30.1 pg (ref 26.0–34.0)
MCHC: 35 g/dL (ref 30.0–36.0)
MCV: 86 fL (ref 80.0–100.0)
Monocytes Absolute: 0.7 10*3/uL (ref 0.1–1.0)
Monocytes Relative: 7 %
Neutro Abs: 5.8 10*3/uL (ref 1.7–7.7)
Neutrophils Relative %: 61 %
Platelets: 235 10*3/uL (ref 150–400)
RBC: 5.42 MIL/uL (ref 4.22–5.81)
RDW: 12.9 % (ref 11.5–15.5)
WBC: 9.4 10*3/uL (ref 4.0–10.5)
nRBC: 0 % (ref 0.0–0.2)

## 2023-05-04 LAB — BASIC METABOLIC PANEL
Anion gap: 9 (ref 5–15)
BUN: 11 mg/dL (ref 6–20)
CO2: 24 mmol/L (ref 22–32)
Calcium: 9 mg/dL (ref 8.9–10.3)
Chloride: 101 mmol/L (ref 98–111)
Creatinine, Ser: 0.83 mg/dL (ref 0.61–1.24)
GFR, Estimated: >60 mL/min (ref >60)
Glucose, Bld: 130 mg/dL — ABNORMAL HIGH (ref 70–99)
Potassium: 4.2 mmol/L (ref 3.5–5.1)
Sodium: 134 mmol/L — ABNORMAL LOW (ref 135–145)

## 2023-05-04 LAB — TROPONIN I (HIGH SENSITIVITY): Troponin I (High Sensitivity): 3 ng/L (ref <18)

## 2023-05-04 MED ORDER — CYCLOBENZAPRINE HCL 10 MG PO TABS
10.0000 mg | ORAL_TABLET | Freq: Two times a day (BID) | ORAL | 0 refills | Status: DC | PRN
  Filled 2023-05-04: qty 20, 10d supply, fill #0

## 2023-05-04 MED ORDER — NAPROXEN 375 MG PO TABS
375.0000 mg | ORAL_TABLET | Freq: Two times a day (BID) | ORAL | 0 refills | Status: DC
  Filled 2023-05-04: qty 20, 10d supply, fill #0

## 2023-05-04 MED ORDER — LIDOCAINE 5 % EX PTCH
1.0000 | MEDICATED_PATCH | CUTANEOUS | Status: DC
  Administered 2023-05-04: 1 via TRANSDERMAL
  Filled 2023-05-04: qty 1

## 2023-05-04 NOTE — ED Notes (Signed)
Cannot discharge, registration in chart.   

## 2023-05-04 NOTE — Discharge Instructions (Addendum)
Recommend 1000 mg of Tylenol every 6 hours as needed for pain.  Recommend using Celebrex or naproxen but not both to help with discomfort as well.  I have prescribed you a muscle relaxant called Flexeril.  This medication is sedating so do not mix with alcohol or drugs or other dangerous activities.  Follow-up with primary care doctor.

## 2023-05-04 NOTE — ED Provider Notes (Signed)
Jordan Buchanan Provider Note   CSN: [id number] Arrival date & time: 05/04/23  9629     History  Chief Complaint  Patient presents with   Chest Pain    Jordan Buchanan is a 52 y.o. male.  Patient arrives here with left-sided chest/rib pain.  Pain for the last few days.  States that he picked up a pen off the floor and heard a pop in the left lower ribs.  Pain since.  Nothing makes it worse or better.  Denies any shortness of breath, cough, sputum production.  Although he does have somewhat of a chronic cough.  He denies any recent surgery or travel.  No significant cardiac history.  No abdominal pain nausea vomiting diarrhea.  The history is provided by the patient.       Home Medications Prior to Admission medications   Medication Sig Start Date End Date Taking? Authorizing Provider  albuterol (VENTOLIN HFA) 108 (90 Base) MCG/ACT inhaler Inhale 2 puffs into the lungs every 4 (four) hours as needed for wheezing or shortness of breath. Rescue inhaler 01/05/23  Yes Eustaquio Boyden, MD  amLODipine (NORVASC) 10 MG tablet Take 1 tablet (10 mg total) by mouth daily. 05/10/22  Yes Eustaquio Boyden, MD  budesonide-formoterol (SYMBICORT) 160-4.5 MCG/ACT inhaler INHALE 1-2 PUFFS INTO THE LUNGS IN THE MORNING AND AT BEDTIME. Patient taking differently: Inhale 1-2 puffs into the lungs in the morning and at bedtime. 03/01/23  Yes Eustaquio Boyden, MD  cyclobenzaprine (FLEXERIL) 10 MG tablet Take 1 tablet (10 mg total) by mouth 2 (two) times daily as needed for muscle spasms. 05/04/23  Yes Lovette Merta, DO  loratadine (CLARITIN) 10 MG tablet Take 10 mg by mouth daily.   Yes [provider]  metoprolol succinate (TOPROL-XL) 50 MG 24 hr tablet TAKE 1 TABLET BY MOUTH DAILY. TAKE WITH OR IMMEDIATELY FOLLOWING A MEAL. 04/21/23  Yes Eustaquio Boyden, MD  Multiple Vitamins-Minerals (MULTIVITAMIN MEN PO) Take 1 tablet by mouth daily.   Yes [provider]  naproxen (NAPROSYN) 375 MG tablet Take 1 tablet (375 mg total) by mouth 2 (two) times daily. 05/04/23  Yes Millard Bautch, DO  Omega-3 Fatty Acids (FISH OIL) 1000 MG CAPS Take 1 capsule (1,000 mg total) by mouth daily. 05/10/22  Yes Eustaquio Boyden, MD  pantoprazole (PROTONIX) 40 MG tablet Take 40 mg by mouth 2 (two) times daily.  09/06/19 05/04/23 Yes [provider]  Turmeric 500 MG CAPS Take 2 capsules by mouth daily. 05/10/22  Yes Eustaquio Boyden, MD  famotidine (PEPCID) 20 MG tablet Take 1 tablet (20 mg total) by mouth at bedtime. Patient not taking: Reported on 05/04/2023 05/10/22   Eustaquio Boyden, MD  nystatin (MYCOSTATIN) 100000 UNIT/ML suspension Take 5 mLs (500,000 Units total) by mouth 4 (four) times daily. 01/12/23   Eustaquio Boyden, MD  promethazine-dextromethorphan (PROMETHAZINE-DM) 6.25-15 MG/5ML syrup Take 5 mLs by mouth 4 (four) times daily as needed. Patient not taking: Reported on 05/04/2023 01/11/23   Eustaquio Boyden, MD  sucralfate (CARAFATE) 1 g tablet Take 1 g by mouth 4 (four) times daily. 05/30/21   [provider]      Allergies    Hctz [hydrochlorothiazide]    Review of Systems   Review of Systems  Physical Exam Updated Vital Signs BP (!) 167/103 (BP Location: Right Arm)   Pulse 86   Temp 98 F (36.7 C) (Oral)   Ht 5\' 10"  (1.778 m)   Wt 113.4 kg  SpO2 97%   BMI 35.87 kg/m  Physical Exam Vitals and nursing note reviewed.  Constitutional:      General: He is not in acute distress.    Appearance: He is well-developed.  HENT:     Head: Normocephalic and atraumatic.  Eyes:     Conjunctiva/sclera: Conjunctivae normal.  Cardiovascular:     Rate and Rhythm: Normal rate and regular rhythm.     Heart sounds: No murmur heard. Pulmonary:     Effort: Pulmonary effort is normal. No respiratory distress.     Breath sounds: Normal breath sounds. No decreased breath sounds or wheezing.  Chest:     Chest wall: Tenderness  present.     Comments: Tenderness over the left lower rib wall Abdominal:     Palpations: Abdomen is soft.     Tenderness: There is no abdominal tenderness.  Musculoskeletal:        General: No swelling.     Cervical back: Neck supple.  Skin:    General: Skin is warm and dry.     Capillary Refill: Capillary refill takes less than 2 seconds.  Neurological:     Mental Status: He is alert.  Psychiatric:        Mood and Affect: Mood normal.     ED Results / Procedures / Treatments   Labs (all labs ordered are listed, but only abnormal results are displayed) Labs Reviewed  CBC WITH DIFFERENTIAL/PLATELET - Abnormal; Notable for the following components:      Result Value   Abs Immature Granulocytes 0.08 (*)    All other components within normal limits  BASIC METABOLIC PANEL - Abnormal; Notable for the following components:   Sodium 134 (*)    Glucose, Bld 130 (*)    All other components within normal limits  TROPONIN I (HIGH SENSITIVITY)    EKG EKG Interpretation  Date/Time:  Wednesday May 04 2023 07:30:20 EDT Ventricular Rate:  79 PR Interval:  197 QRS Duration: 87 QT Interval:  355 QTC Calculation: 407 R Axis:   55 Text Interpretation: Sinus rhythm Confirmed by Virgina Norfolk ([id number]) on 05/04/2023 7:32:47 AM  Radiology DG Chest Portable 1 View  Result Date: 05/04/2023 CLINICAL DATA:  52 year old male with left rib pain. EXAM: PORTABLE CHEST - 1 VIEW COMPARISON:  01/27/2023, 01/25/2022 FINDINGS: The mediastinal contours are within normal limits. No cardiomegaly. The lungs are clear bilaterally without evidence of focal consolidation, pleural effusion, or pneumothorax. No acute osseous abnormality. IMPRESSION: No acute cardiopulmonary process. Electronically Signed   By: Marliss Coots M.D.   On: 05/04/2023 07:58    Procedures Procedures    Medications Ordered in ED Medications  lidocaine (LIDODERM) 5 % 1 patch (1 patch Transdermal Patch Applied 05/04/23 0737)    ED  Course/ Medical Decision Making/ A&P                             Medical Decision Making Amount and/or Complexity of Data Reviewed Labs: ordered. Radiology: ordered.  Risk Prescription drug management.   Jordan Buchanan is here with chest wall pain.  Unremarkable vitals.  No fever.  Does appear to have a fairly reproducible chest wall pain on exam.  Differential diagnosis likely something muscular.  Seems like he has a good mechanism for may be subluxed rib or rib wall muscle.  Will however check basic labs check a troponin.  EKG shows sinus rhythm.  No ischemic changes.  Will get a chest  x-ray.  Will put lidocaine patch on.  Seems less likely to be ACS, pneumothorax, pneumonia.  Per my review interpretation of chest x-ray there is no pneumothorax or rib fracture or pneumonia.  Lab work per my review and interpretation shows no significant electrolyte abnormality or kidney injury or leukocytosis.  Troponin is normal.  I have no concern for cardiac or pulmonary process.  Overall I do think that this is a muscular process.  Will prescribe naproxen and Flexeril.  He was recently prescribed Celebrex and understands to use either Celebrex or naproxen but not both at the same time.  Discharged in good condition.  Understands return precautions.  This chart was dictated using voice recognition software.  Despite best efforts to proofread,  errors can occur which can change the documentation meaning.         Final Clinical Impression(s) / ED Diagnoses Final diagnoses:  Chest wall pain    Rx / DC Orders ED Discharge Orders          Ordered    naproxen (NAPROSYN) 375 MG tablet  2 times daily        05/04/23 0748    cyclobenzaprine (FLEXERIL) 10 MG tablet  2 times daily PRN        05/04/23 0748              Virgina Norfolk, DO 05/04/23 [phone]

## 2023-05-04 NOTE — ED Triage Notes (Signed)
Pt bent over to pick up a pen off the floor. During bend there was a pop noise in LUQ. States there was bruising to the area.

## 2023-05-12 ENCOUNTER — Other Ambulatory Visit: Payer: Self-pay | Admitting: Family Medicine

## 2023-05-13 ENCOUNTER — Other Ambulatory Visit (INDEPENDENT_AMBULATORY_CARE_PROVIDER_SITE_OTHER): Payer: BC Managed Care – PPO

## 2023-05-13 LAB — LIPID PANEL
Cholesterol: 145 mg/dL (ref 0–200)
HDL: 34.9 mg/dL — ABNORMAL LOW (ref >39.00)
LDL Cholesterol: 81 mg/dL (ref 0–99)
NonHDL: 110.44
Total CHOL/HDL Ratio: 4
Triglycerides: 146 mg/dL (ref 0.0–149.0)
VLDL: 29.2 mg/dL (ref 0.0–40.0)

## 2023-05-13 LAB — COMPREHENSIVE METABOLIC PANEL
ALT: 33 U/L (ref 0–53)
AST: 23 U/L (ref 0–37)
Albumin: 3.9 g/dL (ref 3.5–5.2)
Alkaline Phosphatase: 67 U/L (ref 39–117)
BUN: 9 mg/dL (ref 6–23)
CO2: 25 mEq/L (ref 19–32)
Calcium: 8.9 mg/dL (ref 8.4–10.5)
Chloride: 104 mEq/L (ref 96–112)
Creatinine, Ser: 0.87 mg/dL (ref 0.40–1.50)
GFR: 101.3 mL/min (ref >60.00)
Glucose, Bld: 112 mg/dL — ABNORMAL HIGH (ref 70–99)
Potassium: 4.4 mEq/L (ref 3.5–5.1)
Sodium: 139 mEq/L (ref 135–145)
Total Bilirubin: 1.2 mg/dL (ref 0.2–1.2)
Total Protein: 6.4 g/dL (ref 6.0–8.3)

## 2023-05-13 LAB — TSH: TSH: 0.64 u[IU]/mL (ref 0.35–5.50)

## 2023-05-19 ENCOUNTER — Ambulatory Visit (INDEPENDENT_AMBULATORY_CARE_PROVIDER_SITE_OTHER): Payer: BC Managed Care – PPO | Admitting: Family Medicine

## 2023-05-19 ENCOUNTER — Encounter: Payer: Self-pay | Admitting: Family Medicine

## 2023-05-19 VITALS — BP 124/80 | HR 95 | Temp 97.5°F | Ht 69.75 in | Wt 256.4 lb

## 2023-05-19 DIAGNOSIS — I10 Essential (primary) hypertension: Secondary | ICD-10-CM

## 2023-05-19 DIAGNOSIS — J45991 Cough variant asthma: Secondary | ICD-10-CM

## 2023-05-19 DIAGNOSIS — K219 Gastro-esophageal reflux disease without esophagitis: Secondary | ICD-10-CM

## 2023-05-19 DIAGNOSIS — D23111 Other benign neoplasm of skin of right upper eyelid, including canthus: Secondary | ICD-10-CM | POA: Diagnosis not present

## 2023-05-19 DIAGNOSIS — G4733 Obstructive sleep apnea (adult) (pediatric): Secondary | ICD-10-CM

## 2023-05-19 DIAGNOSIS — Z0000 Encounter for general adult medical examination without abnormal findings: Secondary | ICD-10-CM

## 2023-05-19 DIAGNOSIS — D23121 Other benign neoplasm of skin of left upper eyelid, including canthus: Secondary | ICD-10-CM | POA: Diagnosis not present

## 2023-05-19 MED ORDER — BUDESONIDE-FORMOTEROL FUMARATE 160-4.5 MCG/ACT IN AERO
INHALATION_SPRAY | RESPIRATORY_TRACT | 6 refills | Status: AC
Start: 2023-05-19 — End: ?

## 2023-05-19 MED ORDER — AMLODIPINE BESYLATE 10 MG PO TABS: 10.0000 mg | ORAL_TABLET | Freq: Every day | ORAL | 4 refills | Status: DC

## 2023-05-19 MED ORDER — METOPROLOL SUCCINATE ER 50 MG PO TB24: 50.0000 mg | ORAL_TABLET | Freq: Every day | ORAL | 4 refills | Status: DC

## 2023-05-19 NOTE — Assessment & Plan Note (Signed)
Chronic, stable. Continue current regimen. Off hctz per derm - ?rash.

## 2023-05-19 NOTE — Assessment & Plan Note (Signed)
Preventative protocols reviewed and updated unless pt declined. Discussed healthy diet and lifestyle.  

## 2023-05-19 NOTE — Assessment & Plan Note (Signed)
Stable period with PRN symbicort and albuterol.

## 2023-05-19 NOTE — Progress Notes (Signed)
Ph: [phone] Fax: [phone]   Patient ID: Jordan Buchanan, male    DOB: 1971/03/15, 52 y.o.   MRN: [id number]  This visit was conducted in person.  BP 124/80   Pulse 95   Temp (!) 97.5 F (36.4 C) (Temporal)   Ht 5' 9.75" (1.772 m)   Wt 256 lb 6 oz (116.3 kg)   SpO2 97%   BMI 37.05 kg/m    CC: CPE Subjective:   HPI: Jordan Buchanan is a 52 y.o. male presenting on 05/19/2023 for Annual Exam   Prolonged hospitalization summer 2021 for perforated diverticulitis complicated by acute cholecystitis s/p cholecystectomy. S/p diverticulitis surgery s/p robotic rectosigmoid resection LAR 05/2020.   GERD - occasional breakthrough heartburn despite pantoprazole 40mg  bid, manages with PRN carafate, non recently. H/o esophageal dilation 2019 [facility]).   Saw Dr Kendrick Fries 01/2023 for multifactorial cough, cough variant asthma - managing with PPI BID. H/o asthmatic bronchitis. Continues symbicort inhaler PRN.   Recent ER visit for chest wall pain/ribcage. Treated with flexeril with benefit.  Upcoming L eyelid biopsy for possible BCC.  Saw ortho M-W started on celebrex 200mg  daily for L ankle pain - taking for a month.  Continues turmeric 500mg  2 daily.    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  Prostate cancer screen - no fmhx Lung cancer screen - not eligible  Cardiac history - had coronary calcium score of 0 (06/2019) Flu shot yearly COVID vaccine Pfizer 02/2020, 03/2020, no booster Tdap 2019 Seat belt use discussed Sunscreen use discussed. No changing moles on skin. Sees derm regularly.  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 Interim medical history since our last visit reviewed. Allergies and medications reviewed and updated. Outpatient Medications Prior to Visit  Medication Sig Dispense Refill   albuterol (VENTOLIN HFA) 108 (90 Base) MCG/ACT inhaler Inhale 2 puffs into the lungs every 4 (four) hours as needed for wheezing or shortness of breath. Rescue inhaler 8 each 4   Omega-3 Fatty Acids (FISH OIL) 1000 MG CAPS Take 1 capsule (1,000 mg total) by mouth daily.  0   sucralfate (CARAFATE) 1 g tablet Take 1 g by mouth as needed (GI issues).     Turmeric 500 MG CAPS Take 2 capsules by mouth daily.     amLODipine (NORVASC) 10 MG tablet Take 1 tablet (10 mg total) by mouth daily. 90 tablet 3   budesonide-formoterol (SYMBICORT) 160-4.5 MCG/ACT inhaler INHALE 1-2 PUFFS INTO THE LUNGS IN THE MORNING AND AT BEDTIME. (Patient taking differently: Inhale 1-2 puffs into the lungs in the morning and at bedtime.) 10.2 each 3   celecoxib (CELEBREX) 200 MG capsule Take 200 mg by mouth every evening.     metoprolol succinate (TOPROL-XL) 50 MG 24 hr tablet TAKE 1 TABLET BY MOUTH DAILY. TAKE WITH OR IMMEDIATELY FOLLOWING A MEAL. 90 tablet 0   celecoxib (CELEBREX) 200 MG capsule Take 1 capsule (200 mg total) by mouth every evening.  pantoprazole (PROTONIX) 40 MG tablet Take 40 mg by mouth 2 (two) times daily.      cyclobenzaprine (FLEXERIL) 10 MG tablet Take 1 tablet (10 mg total) by mouth 2 (two) times daily as needed for muscle spasms. 20 tablet 0   famotidine (PEPCID) 20 MG tablet Take 1 tablet (20 mg total) by mouth at bedtime. (Patient not taking: Reported on 05/04/2023)     ipratropium (ATROVENT) 0.06 % nasal spray Place 2 sprays into both nostrils 4 (four) times daily.     loratadine (CLARITIN) 10 MG tablet Take 10 mg by mouth daily.     Multiple Vitamins-Minerals  (MULTIVITAMIN MEN PO) Take 1 tablet by mouth daily.     naproxen (NAPROSYN) 375 MG tablet Take 1 tablet (375 mg total) by mouth 2 (two) times daily. 20 tablet 0   neomycin-polymyxin b-dexamethasone (MAXITROL) 3.5-10000-0.1 SUSP Place 1-2 drops into the left eye See admin instructions. Place 1 drop 4 times a day for 3 days and then 2 drops 2 times a day for 3 days.     nystatin (MYCOSTATIN) 100000 UNIT/ML suspension Take 5 mLs (500,000 Units total) by mouth 4 (four) times daily. (Patient not taking: Reported on 05/04/2023) 100 mL 0   promethazine-dextromethorphan (PROMETHAZINE-DM) 6.25-15 MG/5ML syrup Take 5 mLs by mouth 4 (four) times daily as needed. (Patient not taking: Reported on 05/04/2023) 118 mL 0   No facility-administered medications prior to visit.     Per HPI unless specifically indicated in ROS section below Review of Systems  Constitutional:  Negative for activity change, appetite change, chills, fatigue, fever and unexpected weight change.  HENT:  Negative for hearing loss.   Eyes:  Negative for visual disturbance.  Respiratory:  Negative for cough, chest tightness, shortness of breath and wheezing.   Cardiovascular:  Negative for chest pain, palpitations and leg swelling.  Gastrointestinal:  Negative for abdominal distention, abdominal pain, blood in stool, constipation, diarrhea, nausea and vomiting.  Genitourinary:  Negative for difficulty urinating and hematuria.  Musculoskeletal:  Negative for arthralgias, myalgias and neck pain.  Skin:  Negative for rash.  Neurological:  Negative for dizziness, seizures, syncope and headaches.  Hematological:  Negative for adenopathy. Does not bruise/bleed easily.  Psychiatric/Behavioral:  Negative for dysphoric mood. The patient is not nervous/anxious.     Objective:  BP 124/80   Pulse 95   Temp (!) 97.5 F (36.4 C) (Temporal)   Ht 5' 9.75" (1.772 m)   Wt 256 lb 6 oz (116.3 kg)   SpO2 97%   BMI 37.05 kg/m   Wt Readings from Last  3 Encounters:  05/19/23 256 lb 6 oz (116.3 kg)  05/04/23 250 lb (113.4 kg)  01/27/23 258 lb 3.2 oz (117.1 kg)      Physical Exam Vitals and nursing note reviewed.  Constitutional:      General: He is not in acute distress.    Appearance: Normal appearance. He is well-developed. He is not ill-appearing.  HENT:     Head: Normocephalic and atraumatic.     Right Ear: Hearing, tympanic membrane, ear canal and external ear normal.     Left Ear: Hearing, tympanic membrane, ear canal and external ear normal.     Nose: Nose normal.     Mouth/Throat:     Mouth: Mucous membranes are moist.     Pharynx: Oropharynx is clear. No oropharyngeal exudate or posterior oropharyngeal erythema.  Eyes:     General: No scleral icterus.    Extraocular Movements: Extraocular movements  intact.     Conjunctiva/sclera: Conjunctivae normal.     Pupils: Pupils are equal, round, and reactive to light.  Neck:     Thyroid: No thyroid mass or thyromegaly.  Cardiovascular:     Rate and Rhythm: Normal rate and regular rhythm.     Pulses: Normal pulses.          Radial pulses are 2+ on the right side and 2+ on the left side.     Heart sounds: Normal heart sounds. No murmur heard. Pulmonary:     Effort: Pulmonary effort is normal. No respiratory distress.     Breath sounds: Normal breath sounds. No wheezing, rhonchi or rales.  Abdominal:     General: Bowel sounds are normal. There is no distension.     Palpations: Abdomen is soft. There is no mass.     Tenderness: There is no abdominal tenderness. There is no guarding or rebound.     Hernia: No hernia is present.  Musculoskeletal:        General: Normal range of motion.     Cervical back: Normal range of motion and neck supple.     Right lower leg: No edema.     Left lower leg: No edema.  Lymphadenopathy:     Cervical: No cervical adenopathy.  Skin:    General: Skin is warm and dry.     Findings: No rash.  Neurological:     General: No focal deficit  present.     Mental Status: He is alert and oriented to person, place, and time.  Psychiatric:        Mood and Affect: Mood normal.        Behavior: Behavior normal.        Thought Content: Thought content normal.        Judgment: Judgment normal.       Results for orders placed or performed in visit on 05/13/23  Lipid panel  Result Value Ref Range   Cholesterol 145 0 - 200 mg/dL   Triglycerides 161.0 0.0 - 149.0 mg/dL   HDL 96.04 (L) >54.09 mg/dL   VLDL 81.1 0.0 - 91.4 mg/dL   LDL Cholesterol 81 0 - 99 mg/dL   Total CHOL/HDL Ratio 4    NonHDL 110.44   Comprehensive metabolic panel  Result Value Ref Range   Sodium 139 135 - 145 mEq/L   Potassium 4.4 3.5 - 5.1 mEq/L   Chloride 104 96 - 112 mEq/L   CO2 25 19 - 32 mEq/L   Glucose, Bld 112 (H) 70 - 99 mg/dL   BUN 9 6 - 23 mg/dL   Creatinine, Ser 7.82 0.40 - 1.50 mg/dL   Total Bilirubin 1.2 0.2 - 1.2 mg/dL   Alkaline Phosphatase 67 39 - 117 U/L   AST 23 0 - 37 U/L   ALT 33 0 - 53 U/L   Total Protein 6.4 6.0 - 8.3 g/dL   Albumin 3.9 3.5 - 5.2 g/dL   GFR 956.21 >30.86 mL/min   Calcium 8.9 8.4 - 10.5 mg/dL  TSH  Result Value Ref Range   TSH 0.64 0.35 - 5.50 uIU/mL    Assessment & Plan:   Problem List Items Addressed This Visit     Health maintenance examination - Primary (Chronic)    Preventative protocols reviewed and updated unless pt declined. Discussed healthy diet and lifestyle.       Hypertension    Chronic, stable. Continue current regimen. Off hctz per derm - ?rash.  Relevant Medications   metoprolol succinate (TOPROL-XL) 50 MG 24 hr tablet   amLODipine (NORVASC) 10 MG tablet   GERD (gastroesophageal reflux disease)    Chronic, managed with PPI BID with rare carafate.       Cough variant asthma     Stable period with PRN symbicort and albuterol.       Relevant Medications   budesonide-formoterol (SYMBICORT) 160-4.5 MCG/ACT inhaler   Severe obesity (BMI 35.0-39.9) with comorbidity (HCC)     Encouraged healthy diet and lifestyle choices to affect sustainable weight loss.       OSA (obstructive sleep apnea)     Meds ordered this encounter  Medications   budesonide-formoterol (SYMBICORT) 160-4.5 MCG/ACT inhaler    Sig: INHALE 1-2 PUFFS INTO THE LUNGS IN THE MORNING AND AT BEDTIME    Dispense:  10.2 each    Refill:  6   metoprolol succinate (TOPROL-XL) 50 MG 24 hr tablet    Sig: Take 1 tablet (50 mg total) by mouth daily. TAKE WITH OR IMMEDIATELY FOLLOWING A MEAL.    Dispense:  90 tablet    Refill:  4   amLODipine (NORVASC) 10 MG tablet    Sig: Take 1 tablet (10 mg total) by mouth daily.    Dispense:  90 tablet    Refill:  4    No orders of the defined types were placed in this encounter.   Patient Instructions  Schedule dentist appointment.  Good to see you today Return as needed or in 1 year for next physical.   Follow up plan: Return in about 1 year (around 05/18/2024) for annual exam, prior fasting for blood work.  Eustaquio Boyden, MD

## 2023-05-19 NOTE — Assessment & Plan Note (Signed)
Chronic, managed with PPI BID with rare carafate.

## 2023-05-19 NOTE — Assessment & Plan Note (Signed)
Encouraged healthy diet and lifestyle choices to affect sustainable weight loss.  ?

## 2023-05-19 NOTE — Patient Instructions (Addendum)
Schedule dentist appointment.  Good to see you today Return as needed or in 1 year for next physical.

## 2023-05-31 DIAGNOSIS — M25562 Pain in left knee: Secondary | ICD-10-CM | POA: Diagnosis not present

## 2023-07-05 DIAGNOSIS — M659 Synovitis and tenosynovitis, unspecified: Secondary | ICD-10-CM | POA: Diagnosis not present

## 2023-07-05 DIAGNOSIS — M25562 Pain in left knee: Secondary | ICD-10-CM | POA: Diagnosis not present

## 2023-09-13 ENCOUNTER — Encounter: Payer: Self-pay | Admitting: Nurse Practitioner

## 2023-09-19 ENCOUNTER — Ambulatory Visit: Payer: BC Managed Care – PPO | Admitting: Podiatry

## 2023-09-19 ENCOUNTER — Ambulatory Visit (INDEPENDENT_AMBULATORY_CARE_PROVIDER_SITE_OTHER): Payer: BC Managed Care – PPO

## 2023-09-19 DIAGNOSIS — M25572 Pain in left ankle and joints of left foot: Secondary | ICD-10-CM | POA: Diagnosis not present

## 2023-09-19 DIAGNOSIS — R52 Pain, unspecified: Secondary | ICD-10-CM

## 2023-09-19 DIAGNOSIS — M7752 Other enthesopathy of left foot: Secondary | ICD-10-CM

## 2023-09-19 MED ORDER — METHYLPREDNISOLONE 4 MG PO TBPK: ORAL_TABLET | ORAL | 0 refills | Status: DC

## 2023-09-19 MED ORDER — BETAMETHASONE SOD PHOS & ACET 6 (3-3) MG/ML IJ SUSP
3.0000 mg | Freq: Once | INTRAMUSCULAR | Status: AC
Start: 2023-09-19 — End: 2023-09-19
  Administered 2023-09-19: 3 mg via INTRA_ARTICULAR

## 2023-09-19 NOTE — Progress Notes (Signed)
Chief Complaint  Patient presents with   Foot Pain    LEFT FOOT/ANKLE PAIN, SAW ORTHO AGO AND ITS NOT GETTING BETTER, BEEN HAVING THE PAIN FOR , BEEN WEARING A COMPRESSION SLEEVE, ORTHO TOLD HIM HE HAS A BONE SPUR    Subjective:  52 y.o. male presenting today for a new complaint of pain and tenderness associated to the lateral aspect of the left ankle.  Ongoing for about 6 months.  Idiopathic onset.  Denies a history of injury.  Patient states that throughout the day it is not so painful but at night when he is relaxing and elevating his foot he has significant ankle pain.  It is more painful when the foot is in a relaxed more plantarflexed position.  He says he has to use a pillow to keep his foot at a more 90 degree angle which alleviate some of his symptoms.  Past Medical History:  Diagnosis Date   Acute calculous cholecystitis s/p lap cholecystectomy 04/30/2020 04/30/2020   Arrhythmia    PALPITATIONS AND PVC - improved with cutting down on caffeine   Cough variant asthma 05/28/2014   05/27/2014 p extensive coaching HFA effectiveness =    75% > try dulera 100 2bid  - PFT's 07/03/2014 FEV1 4.15 (98%) ratio 89 and no chang p B2 and nl fef25-75  And nl dlco     COVID-19 virus infection 11/2020   Diverticulitis of large intestine with perforation and abscess s/p lap drainage & washout 04/30/2020 04/13/2020   Diverticulosis 07/2015   by CT scan   Dysplastic nevus 05/15/2020   Left lat. base of neck. Moderate atypia, lateral margin involved.    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 Corliss Skains)   Tremor    Urticaria     Past Surgical History:  Procedure Laterality Date   CHOLECYSTECTOMY N/A 04/30/2020    Procedure: LAPAROSCOPIC CHOLECYSTECTOMY WITH INTRAOPERATIVE CHOLANGIOGRAM, BILATERAL TAP BLOCK;  Surgeon: Karie Soda, MD;  Location: [facility];  Service: General;  Laterality: N/A;   ESOPHAGOGASTRODUODENOSCOPY  08/2018   esophagus dilated, biopsy showed chronic gastritis with mod reflux changes ([place])   LAPAROSCOPY N/A 04/30/2020   Procedure: LAPAROSCOPY DIAGNOSTIC WITH WASHOUT AND DRAINAGE PLACEMENT, DRAINAGE OF DIVERTICULITIS ABSCESS;  Surgeon: Karie Soda, MD;  Location: [facility];  Service: General;  Laterality: N/A;   NASAL RECONSTRUCTION WITH SEPTAL REPAIR  2017   [place]   OTHER SURGICAL HISTORY  1992   facial surgery   PROCTOSCOPY N/A 06/17/2020   Procedure: RIGID PROCTOSCOPY;  Surgeon: Karie Soda, MD;  Location: [facility];  Service: General;  Laterality: N/A;   ROBOTIC LOW ANTERIOR RECTOSIGMOID RESECTION  06/17/2020   for recurrent L sided diverticulitis ([place])   ROTATOR CUFF REPAIR  2009   left   SINOSCOPY     UMBILICAL HERNIA REPAIR  05/10/2012   Procedure: HERNIA REPAIR UMBILICAL ADULT;  Surgeon: Shelly Rubenstein, MD;  Location: [facility];  Service: General;  Laterality: N/A;    Allergies  Allergen Reactions   Hctz [Hydrochlorothiazide] Rash and Other (See Comments)    Per derm rec stay off HCTZ (2018)    Objective / Physical Exam:  General:  The patient is alert and oriented x3 in no acute distress. Dermatology:  Skin is warm, dry and supple bilateral lower extremities. Negative for open lesions or macerations. Vascular:  Palpable pedal pulses bilaterally. No edema or erythema noted. Capillary refill within normal limits.  Clinically no concern for vascular compromise Neurological:  Grossly intact via light touch Musculoskeletal Exam:  Pain on palpation to the lateral aspect of the patient's left ankle.  No. Range of motion within normal limits to all pedal and ankle joints bilateral. Muscle strength 5/5 in all groups bilateral.   Radiographic Exam  LT ankle 09/19/2023:  Normal osseous mineralization. Joint spaces preserved. No fracture/dislocation/boney destruction.  Impression: Negative  Assessment: 1.  Capsulitis lateral aspect of the left ankle  Plan of Care:  -Patient was evaluated. X-Rays reviewed.  -Injection of 0.5 cc Celestone Soluspan injected along the anterolateral aspect of the left ankle joint -Prescription for Medrol Dosepak -Ankle brace dispensed.  Wear daily -Night splint dispensed to wear nightly to keep his foot in a 90 degree angle almost immobilized to allow for healing.  Patient states that he experiences the majority of his symptoms and pain at night when his foot is in a relaxed plantarflexed position -Return to clinic 4 weeks   Felecia Shelling, DPM [facility]  Dr. Felecia Shelling, DPM    [address] [place], [place] [address]                Office [phone]  Fax [phone]      I do not

## 2023-09-21 ENCOUNTER — Ambulatory Visit: Payer: BC Managed Care – PPO | Admitting: Dermatology

## 2023-10-01 DIAGNOSIS — L309 Dermatitis, unspecified: Secondary | ICD-10-CM | POA: Diagnosis not present

## 2023-10-17 ENCOUNTER — Encounter: Payer: Self-pay | Admitting: Podiatry

## 2023-10-17 ENCOUNTER — Ambulatory Visit: Payer: BC Managed Care – PPO | Admitting: Podiatry

## 2023-10-17 VITALS — Ht 69.75 in | Wt 256.0 lb

## 2023-10-17 DIAGNOSIS — M7752 Other enthesopathy of left foot: Secondary | ICD-10-CM | POA: Diagnosis not present

## 2023-10-17 MED ORDER — MELOXICAM 15 MG PO TABS: 15.0000 mg | ORAL_TABLET | Freq: Every day | ORAL | 1 refills | Status: DC

## 2023-10-17 NOTE — Addendum Note (Signed)
Addended by: Felecia Shelling on: 10/17/2023 10:16 AM   Modules accepted: Orders

## 2023-10-17 NOTE — Progress Notes (Signed)
Chief Complaint  Patient presents with   Foot Pain    Patient is here for F/U for left foot and ankle pain    Subjective:  52 y.o. male presenting today for follow-up evaluation of ankle pain to the lateral aspect of the left lower extremity.  Patient is doing significantly better.  He says that he has been very active and busy over the last few weeks and has minimal to no pain.  He wears the ankle brace daily.  He also wears the night splint.  Past Medical History:  Diagnosis Date   Acute calculous cholecystitis s/p lap cholecystectomy 04/30/2020 04/30/2020   Arrhythmia    PALPITATIONS AND PVC - improved with cutting down on caffeine   Cough variant asthma 05/28/2014   05/27/2014 p extensive coaching HFA effectiveness =    75% > try dulera 100 2bid  - PFT's 07/03/2014 FEV1 4.15 (98%) ratio 89 and no chang p B2 and nl fef25-75  And nl dlco     COVID-19 virus infection 11/2020   Diverticulitis of large intestine with perforation and abscess s/p lap drainage & washout 04/30/2020 04/13/2020   Diverticulosis 07/2015   by CT scan   Dysplastic nevus 05/15/2020   Left lat. base of neck. Moderate atypia, lateral margin involved.    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 Corliss Skains)   Tremor    Urticaria     Past Surgical History:  Procedure Laterality Date   CHOLECYSTECTOMY N/A 04/30/2020   Procedure: LAPAROSCOPIC CHOLECYSTECTOMY WITH INTRAOPERATIVE CHOLANGIOGRAM, BILATERAL TAP BLOCK;  Surgeon: Karie Soda, MD;  Location: [facility];  Service: General;  Laterality: N/A;   ESOPHAGOGASTRODUODENOSCOPY  08/2018   esophagus dilated, biopsy showed chronic gastritis with mod reflux changes ([place])   LAPAROSCOPY N/A 04/30/2020    Procedure: LAPAROSCOPY DIAGNOSTIC WITH WASHOUT AND DRAINAGE PLACEMENT, DRAINAGE OF DIVERTICULITIS ABSCESS;  Surgeon: Karie Soda, MD;  Location: [facility];  Service: General;  Laterality: N/A;   NASAL RECONSTRUCTION WITH SEPTAL REPAIR  2017   [place]   OTHER SURGICAL HISTORY  1992   facial surgery   PROCTOSCOPY N/A 06/17/2020   Procedure: RIGID PROCTOSCOPY;  Surgeon: Karie Soda, MD;  Location: [facility];  Service: General;  Laterality: N/A;   ROBOTIC LOW ANTERIOR RECTOSIGMOID RESECTION  06/17/2020   for recurrent L sided diverticulitis ([place])   ROTATOR CUFF REPAIR  2009   left   SINOSCOPY     UMBILICAL HERNIA REPAIR  05/10/2012   Procedure: HERNIA REPAIR UMBILICAL ADULT;  Surgeon: Shelly Rubenstein, MD;  Location: [facility];  Service: General;  Laterality: N/A;    Allergies  Allergen Reactions   Hctz [Hydrochlorothiazide] Rash and Other (See Comments)    Per derm rec stay off HCTZ (2018)    Objective / Physical Exam:  General:  The patient is alert and oriented x3 in no acute distress. Dermatology:  Skin is warm, dry and supple bilateral lower extremities. Negative for open lesions or macerations. Vascular:  Palpable pedal pulses bilaterally. No edema or erythema noted. Capillary refill within normal limits.  Clinically no concern for vascular compromise Neurological:  Grossly intact via light touch Musculoskeletal Exam:  Negative for  any significant pain on palpation to the lateral aspect of the patient's left ankle.  No. Range of motion within normal limits to all pedal and ankle joints bilateral. Muscle strength 5/5 in all groups bilateral.   Radiographic Exam LT ankle 09/19/2023:  Normal osseous mineralization. Joint spaces preserved. No fracture/dislocation/boney destruction.  Impression: Negative  Assessment: 1.  Capsulitis lateral aspect of the left ankle  Plan of Care:  -Patient was evaluated.  -Overall the patient doing very well.  He has been very  active with minimal pain.  He wears the ankle brace almost daily.  Continue as needed -Prescription for meloxicam 15 mg daily as needed.  Patient currently not taking any anti-inflammatories -Continue wearing good supportive shoes and sneakers -Return to clinic as needed  *Works for city of [employer]    Felecia Shelling, DPM [facility]  Dr. Felecia Shelling, DPM    [address] [place], [place] [address]                Office [phone]  Fax [phone]      I do not

## 2023-10-25 ENCOUNTER — Encounter: Payer: Self-pay | Admitting: Family Medicine

## 2023-10-25 DIAGNOSIS — J0100 Acute maxillary sinusitis, unspecified: Secondary | ICD-10-CM | POA: Diagnosis not present

## 2023-10-26 ENCOUNTER — Ambulatory Visit: Payer: BC Managed Care – PPO | Admitting: Internal Medicine

## 2023-10-27 ENCOUNTER — Encounter: Payer: Self-pay | Admitting: Family Medicine

## 2023-12-05 DIAGNOSIS — L720 Epidermal cyst: Secondary | ICD-10-CM | POA: Diagnosis not present

## 2023-12-05 DIAGNOSIS — D1801 Hemangioma of skin and subcutaneous tissue: Secondary | ICD-10-CM | POA: Diagnosis not present

## 2023-12-05 DIAGNOSIS — L820 Inflamed seborrheic keratosis: Secondary | ICD-10-CM | POA: Diagnosis not present

## 2023-12-05 DIAGNOSIS — L821 Other seborrheic keratosis: Secondary | ICD-10-CM | POA: Diagnosis not present

## 2023-12-05 DIAGNOSIS — L570 Actinic keratosis: Secondary | ICD-10-CM | POA: Diagnosis not present

## 2023-12-05 DIAGNOSIS — L308 Other specified dermatitis: Secondary | ICD-10-CM | POA: Diagnosis not present

## 2023-12-26 ENCOUNTER — Encounter: Payer: Self-pay | Admitting: Nurse Practitioner

## 2023-12-26 ENCOUNTER — Other Ambulatory Visit (INDEPENDENT_AMBULATORY_CARE_PROVIDER_SITE_OTHER): Payer: BC Managed Care – PPO

## 2023-12-26 ENCOUNTER — Ambulatory Visit: Payer: BC Managed Care – PPO | Admitting: Nurse Practitioner

## 2023-12-26 VITALS — BP 120/74 | HR 76 | Ht 70.0 in | Wt 231.2 lb

## 2023-12-26 DIAGNOSIS — K219 Gastro-esophageal reflux disease without esophagitis: Secondary | ICD-10-CM | POA: Diagnosis not present

## 2023-12-26 DIAGNOSIS — K5900 Constipation, unspecified: Secondary | ICD-10-CM

## 2023-12-26 DIAGNOSIS — R1012 Left upper quadrant pain: Secondary | ICD-10-CM

## 2023-12-26 DIAGNOSIS — Z83719 Family history of colon polyps, unspecified: Secondary | ICD-10-CM

## 2023-12-26 DIAGNOSIS — Z8719 Personal history of other diseases of the digestive system: Secondary | ICD-10-CM

## 2023-12-26 LAB — COMPREHENSIVE METABOLIC PANEL
ALT: 23 U/L (ref 0–53)
AST: 17 U/L (ref 0–37)
Albumin: 4.7 g/dL (ref 3.5–5.2)
Alkaline Phosphatase: 71 U/L (ref 39–117)
BUN: 16 mg/dL (ref 6–23)
CO2: 27 meq/L (ref 19–32)
Calcium: 9.7 mg/dL (ref 8.4–10.5)
Chloride: 100 meq/L (ref 96–112)
Creatinine, Ser: 0.91 mg/dL (ref 0.40–1.50)
GFR: 98.51 mL/min (ref >60.00)
Glucose, Bld: 106 mg/dL — ABNORMAL HIGH (ref 70–99)
Potassium: 4.6 meq/L (ref 3.5–5.1)
Sodium: 136 meq/L (ref 135–145)
Total Bilirubin: 1.1 mg/dL (ref 0.2–1.2)
Total Protein: 7.5 g/dL (ref 6.0–8.3)

## 2023-12-26 LAB — CBC WITH DIFFERENTIAL/PLATELET
Basophils Absolute: 0 10*3/uL (ref 0.0–0.1)
Basophils Relative: 0.4 % (ref 0.0–3.0)
Eosinophils Absolute: 0.2 10*3/uL (ref 0.0–0.7)
Eosinophils Relative: 2.1 % (ref 0.0–5.0)
HCT: 48.3 % (ref 39.0–52.0)
Hemoglobin: 16.5 g/dL (ref 13.0–17.0)
Lymphocytes Relative: 19.4 % (ref 12.0–46.0)
Lymphs Abs: 1.9 10*3/uL (ref 0.7–4.0)
MCHC: 34.2 g/dL (ref 30.0–36.0)
MCV: 88.6 fL (ref 78.0–100.0)
Monocytes Absolute: 0.7 10*3/uL (ref 0.1–1.0)
Monocytes Relative: 6.9 % (ref 3.0–12.0)
Neutro Abs: 6.9 10*3/uL (ref 1.4–7.7)
Neutrophils Relative %: 71.2 % (ref 43.0–77.0)
Platelets: 295 10*3/uL (ref 150.0–400.0)
RBC: 5.45 Mil/uL (ref 4.22–5.81)
RDW: 14 % (ref 11.5–15.5)
WBC: 9.7 10*3/uL (ref 4.0–10.5)

## 2023-12-26 LAB — LIPASE: Lipase: 36 U/L (ref 11.0–59.0)

## 2023-12-26 NOTE — Progress Notes (Signed)
 12/26/2023 Colyn Miron [id number] 11/03/1971   CHIEF COMPLAINT: LUQ pain  HISTORY OF PRESENT ILLNESS: Jordan Buchanan is a 53 year old male with a past medical history of arthritis, hypertension, hepatic steatosis, GERD, C. Diff infection in 2017 or 2018, diverticulitis with a perforation s/p diagnostic laparoscopy with drainage and washout 04/2020 and s/p low anterior rectosigmoid resection 05/2020. S/P cholecystectomy secondary to gallstones 04/2020.  He presents to our office today for further evaluation regarding LUQ pain which occurred two months ago and lasted for 3 days then abated without recurrence. He described having sharp LUQ pain associated with feeling constipated. He stated the third day was the worst level of pain, he almost went to the ED but decided not to go. The next day, his LUQ pain was significantly less and after a few days completely resolved. No associated N/V. No heartburn or dysphagia. He has a history of GERD and dysphagia and underwent an EGD 09/01/2018 which showed scattered mild mucosa with altered texture and white specks throughout the entire esophagus concerning for eosinophilic esophagitis and gastritis.  Path report showed reflux esophagitis without evidence of H. pylori gastritis.  The esophagus was dilated. He takes pantoprazole  40 mg twice daily.  He typically passes 1-2 brown formed stools daily.  He sometimes notices decreased stool output and strains as a BM.  No rectal bleeding or black stools.  He takes Metamucil daily since he underwent LAR surgery as noted above.  He underwent a colonoscopy prior to his LAR surgery 06/16/2020 which showed a sigmoid stricture, internal hemorrhoids and no polyps.  Father with history of colon polyps.  Maternal grandmother died in her 71s from a ruptured colon, further details are unclear.  He has intentionally lost 30 pounds since mid July by reducing his fat and carbohydrates in his diet.  Infrequent ibuprofen  use.  Remote history  of alcohol use disorder, abstinent since 2002.  He quit smoking cigarettes in 2002 as well.     Latest Ref Rng & Units 05/04/2023    7:45 AM 05/03/2022    7:30 AM 01/25/2022    8:21 PM  CBC  WBC 4.0 - 10.5 K/uL 9.4  7.4  10.0   Hemoglobin 13.0 - 17.0 g/dL 83.6  84.3  84.1   Hematocrit 39.0 - 52.0 % 46.6  45.0  45.1   Platelets 150 - 400 K/uL 235  241.0  293        Latest Ref Rng & Units 05/13/2023    7:48 AM 05/04/2023    7:45 AM 05/03/2022    7:30 AM  CMP  Glucose 70 - 99 mg/dL 887  869  887   BUN 6 - 23 mg/dL 9  11  9    Creatinine 0.40 - 1.50 mg/dL 9.12  9.16  9.13   Sodium 135 - 145 mEq/L 139  134  138   Potassium 3.5 - 5.1 mEq/L 4.4  4.2  4.3   Chloride 96 - 112 mEq/L 104  101  105   CO2 19 - 32 mEq/L 25  24  27    Calcium 8.4 - 10.5 mg/dL 8.9  9.0  9.1   Total Protein 6.0 - 8.3 g/dL 6.4   6.9   Total Bilirubin 0.2 - 1.2 mg/dL 1.2   0.7   Alkaline Phos 39 - 117 U/L 67   66   AST 0 - 37 U/L 23   21   ALT 0 - 53 U/L 33   33  PAST GI PROCEDURES:   EGD 09/01/2018:   Colonoscopy 06/16/2020 by Dr. Tye: Sigmoid stricture  Internal hemorrhoids  No polyps    Past Medical History:  Diagnosis Date   Acute calculous cholecystitis s/p lap cholecystectomy 04/30/2020 04/30/2020   Arrhythmia    PALPITATIONS AND PVC - improved with cutting down on caffeine   Cough variant asthma 05/28/2014   05/27/2014 p extensive coaching HFA effectiveness =    75% > try dulera 100 2bid  - PFT's 07/03/2014 FEV1 4.15 (98%) ratio 89 and no chang p B2 and nl fef25-75  And nl dlco     COVID-19 virus infection 11/2020   Diverticulitis of large intestine with perforation and abscess s/p lap drainage & washout 04/30/2020 04/13/2020   Diverticulosis 07/2015   by CT scan   Dysplastic nevus 05/15/2020   Left lat. base of neck. Moderate atypia, lateral margin involved.    GERD (gastroesophageal reflux disease)    Hepatic steatosis    Hypertension    h/o LVH, resolved   Migraines    Perforated diverticulum of  large intestine 03/2020   hospitalization   Pneumonia    Prostatitis 05/09/2013   Seasonal allergies    Spondyloarthropathy    ?AS, HLA-B27 +, CCP neg, prior on humira (Dr. Dolphus)   Tremor    Urticaria    Past Surgical History:  Procedure Laterality Date   CHOLECYSTECTOMY N/A 04/30/2020   Procedure: LAPAROSCOPIC CHOLECYSTECTOMY WITH INTRAOPERATIVE CHOLANGIOGRAM, BILATERAL TAP BLOCK;  Surgeon: Sheldon Standing, MD;  Location: [facility];  Service: General;  Laterality: N/A;   ESOPHAGOGASTRODUODENOSCOPY  08/2018   esophagus dilated, biopsy showed chronic gastritis with mod reflux changes ([place])   LAPAROSCOPY N/A 04/30/2020   Procedure: LAPAROSCOPY DIAGNOSTIC WITH WASHOUT AND DRAINAGE PLACEMENT, DRAINAGE OF DIVERTICULITIS ABSCESS;  Surgeon: Sheldon Standing, MD;  Location: [facility];  Service: General;  Laterality: N/A;   NASAL RECONSTRUCTION WITH SEPTAL REPAIR  2017   [place]   OTHER SURGICAL HISTORY  1992   facial surgery   PROCTOSCOPY N/A 06/17/2020   Procedure: RIGID PROCTOSCOPY;  Surgeon: Sheldon Standing, MD;  Location: [facility];  Service: General;  Laterality: N/A;   ROBOTIC LOW ANTERIOR RECTOSIGMOID RESECTION  06/17/2020   for recurrent L sided diverticulitis ([place])   ROTATOR CUFF REPAIR  2009   left   SINOSCOPY     UMBILICAL HERNIA REPAIR  05/10/2012   Procedure: HERNIA REPAIR UMBILICAL ADULT;  Surgeon: Vicenta DELENA Poli, MD;  Location: [facility];  Service: General;  Laterality: N/A;   Social History: He is married.  He has 2 daughters.  He is a Arboriculturist.  No alcohol or tobacco use since 2002.  No drug use.  Family History: Father with history of asthma, arthritis.  Brother with multiple sclerosis.  Paternal grandfather with CAD.  Allergies  Allergen Reactions   Hctz [Hydrochlorothiazide ] Rash and Other (See Comments)    Per derm rec stay off HCTZ (2018)      Outpatient Encounter Medications as of 12/26/2023  Medication Sig   albuterol  (VENTOLIN   HFA) 108 (90 Base) MCG/ACT inhaler Inhale 2 puffs into the lungs every 4 (four) hours as needed for wheezing or shortness of breath. Rescue inhaler   amLODipine  (NORVASC ) 10 MG tablet Take 1 tablet (10 mg total) by mouth daily.   budesonide -formoterol  (SYMBICORT ) 160-4.5 MCG/ACT inhaler INHALE 1-2 PUFFS INTO THE LUNGS IN THE MORNING AND AT BEDTIME   celecoxib  (CELEBREX ) 200 MG capsule Take 1 capsule (200 mg total)  by mouth every evening.   meloxicam  (MOBIC ) 15 MG tablet Take 1 tablet (15 mg total) by mouth daily.   methylPREDNISolone  (MEDROL  DOSEPAK) 4 MG TBPK tablet 6 day dose pack - take as directed   metoprolol  succinate (TOPROL -XL) 50 MG 24 hr tablet Take 1 tablet (50 mg total) by mouth daily. TAKE WITH OR IMMEDIATELY FOLLOWING A MEAL.   Omega-3 Fatty Acids (FISH OIL ) 1000 MG CAPS Take 1 capsule (1,000 mg total) by mouth daily.   pantoprazole  (PROTONIX ) 40 MG tablet Take 40 mg by mouth 2 (two) times daily.    sucralfate  (CARAFATE ) 1 g tablet Take 1 g by mouth as needed (GI issues).   Turmeric 500 MG CAPS Take 2 capsules by mouth daily.   No facility-administered encounter medications on file as of 12/26/2023.    REVIEW OF SYSTEMS:  Gen: Denies fever, sweats or chills. No weight loss.  CV: Denies chest pain, palpitations or edema. Resp: Denies cough, shortness of breath of hemoptysis.  GI: See HPI. GU: Denies urinary burning, blood in urine, increased urinary frequency or incontinence. MS:+ Arthritis. . Derm: Denies rash, itchiness, skin lesions or unhealing ulcers. Psych: Denies depression, anxiety, memory loss or confusion. Heme: Denies bruising, easy bleeding. Neuro:  Denies headaches, dizziness or paresthesias. Endo:  Denies any problems with DM, thyroid  or adrenal function.  PHYSICAL EXAM: BP 120/74 (BP Location: Left Arm, Patient Position: Sitting, Cuff Size: Large)   Pulse 76   Ht 5' 10 (1.778 m) Comment: height measured without shoes  Wt 231 lb 4 oz (104.9 kg)   BMI 33.18  kg/m   General: 53 year old male in no acute distress. Head: Normocephalic and atraumatic. Eyes:  Sclerae non-icteric, conjunctive pink. Ears: Normal auditory acuity. Mouth: Dentition intact. No ulcers or lesions.  Neck: Supple, no lymphadenopathy or thyromegaly.  Lungs: Clear bilaterally to auscultation without wheezes, crackles or rhonchi. Heart: Regular rate and rhythm. No murmur, rub or gallop appreciated.  Abdomen: Soft, nontender, nondistended. No masses. No hepatosplenomegaly. Normoactive bowel sounds x 4 quadrants.  Rectal: Deferred. Musculoskeletal: Symmetrical with no gross deformities. Skin: Warm and dry. No rash or lesions on visible extremities. Extremities: No edema. Neurological: Alert oriented x 4, no focal deficits.  Psychological:  Alert and cooperative. Normal mood and affect.  ASSESSMENT AND PLAN:  53 year old male presents with sharp LUQ pain which occurred for 3 days 2 months ago without recurrence, possibly associated with constipation.  Normal abdominal exam today.   -CBC, CMP and lipase -CTAP with oral and IV contrast if LUQ pain recurs -Avoid constipation, Benefiber 1 tablespoon daily as patient requested alternative to Metamucil.  MiraLAX  nightly as needed, okay to take nightly if needed. -Drink 64 ounces of water daily  History of GERD and dysphagia.  EGD 08/2018 showed reflux esophagitis and gastritis without evidence of H. pylori.  Esophagus was dilated.  He denies having any active heartburn or recent dysphagia.  On pantoprazole  40 mg p.o. twice daily. -Continue Pantoprazole  40 mg p.o. twice daily -Consider repeat EGD if LUQ pain recurs  Colon cancer screening.  Normal colonoscopy 05/2020.  Father with history of colon polyps. -Next screening colonoscopy due 6/202026.   History of diverticulitis with a perforation s/p diagnostic laparoscopy with washout, drainage of abscess with drain placement 03/2020 and subsequently underwent a low anterior  rectosigmoid resection 05/2020.  -Benefiber and MiraLAX  as ordered above    CC:  Rilla Baller, MD

## 2023-12-26 NOTE — Patient Instructions (Signed)
 Your provider has requested that you go to the basement level for lab work before leaving today. Press B on the elevator. The lab is located at the first door on the left as you exit the elevator.  You will be due for a recall colonoscopy in 05/2025. We will send you a reminder in the mail when it gets closer to that time.  Please purchase the following medications over the counter and take as directed:  Miralax  -1 capful dissolved in at least 8 ounces of water at bedtime as needed.   Benefiber -1 tablespoon once daily.   Please contact office if you have recurrent lower abdominal pain.   Due to recent changes in healthcare laws, you may see the results of your imaging and laboratory studies on MyChart before your provider has had a chance to review them.  We understand that in some cases there may be results that are confusing or concerning to you. Not all laboratory results come back in the same time frame and the provider may be waiting for multiple results in order to interpret others.  Please give us  48 hours in order for your provider to thoroughly review all the results before contacting the office for clarification of your results.   _______________________________________________________  If your blood pressure at your visit was 140/90 or greater, please contact your primary care physician to follow up on this.  _______________________________________________________  If you are age 90 or older, your body mass index should be between 23-30. Your Body mass index is 33.18 kg/m. If this is out of the aforementioned range listed, please consider follow up with your Primary Care Provider.  If you are age 24 or younger, your body mass index should be between 19-25. Your Body mass index is 33.18 kg/m. If this is out of the aformentioned range listed, please consider follow up with your Primary Care Provider.   ________________________________________________________  The [facility]  providers would like to encourage you to use MYCHART to communicate with providers for non-urgent requests or questions.  Due to long hold times on the telephone, sending your provider a message by [facility] may be a faster and more efficient way to get a response.  Please allow 48 business hours for a response.  Please remember that this is for non-urgent requests.  _______________________________________________________  Thank you for choosing me and [facility].  Elida Shawl, CRNP

## 2024-03-27 ENCOUNTER — Other Ambulatory Visit: Payer: Self-pay | Admitting: Podiatry

## 2024-05-08 ENCOUNTER — Encounter: Payer: Self-pay | Admitting: Family Medicine

## 2024-05-08 ENCOUNTER — Ambulatory Visit: Payer: BC Managed Care – PPO | Admitting: Dermatology

## 2024-05-08 DIAGNOSIS — K219 Gastro-esophageal reflux disease without esophagitis: Secondary | ICD-10-CM

## 2024-05-09 MED ORDER — PANTOPRAZOLE SODIUM 40 MG PO TBEC
40.0000 mg | DELAYED_RELEASE_TABLET | Freq: Two times a day (BID) | ORAL | 0 refills | Status: DC
Start: 2024-05-09 — End: 2024-07-10

## 2024-05-09 NOTE — Telephone Encounter (Signed)
 E-scribed refill

## 2024-05-15 ENCOUNTER — Other Ambulatory Visit: Payer: BC Managed Care – PPO

## 2024-05-21 ENCOUNTER — Encounter: Payer: BC Managed Care – PPO | Admitting: Family Medicine

## 2024-06-19 ENCOUNTER — Telehealth: Payer: Self-pay | Admitting: Nurse Practitioner

## 2024-06-19 NOTE — Telephone Encounter (Signed)
 Pt states he is wanting to be seen. Has been having stomach pain along with diarrhea for over a week now. Pt scheduled to see Alan Coombs PA tomorrow at 8:40am. Pt aware of appt.

## 2024-06-19 NOTE — Telephone Encounter (Signed)
 Patient called and stated that he was having upset stomach and GERD as well as diarrhea. Patient is requesting a call back from Nurse. Please advise.

## 2024-06-20 ENCOUNTER — Other Ambulatory Visit (INDEPENDENT_AMBULATORY_CARE_PROVIDER_SITE_OTHER)

## 2024-06-20 ENCOUNTER — Ambulatory Visit: Payer: Self-pay | Admitting: Physician Assistant

## 2024-06-20 ENCOUNTER — Ambulatory Visit: Admitting: Physician Assistant

## 2024-06-20 ENCOUNTER — Encounter: Payer: Self-pay | Admitting: Physician Assistant

## 2024-06-20 VITALS — BP 150/88 | HR 82 | Ht 70.0 in | Wt 222.0 lb

## 2024-06-20 DIAGNOSIS — A09 Infectious gastroenteritis and colitis, unspecified: Secondary | ICD-10-CM | POA: Diagnosis not present

## 2024-06-20 DIAGNOSIS — Z8719 Personal history of other diseases of the digestive system: Secondary | ICD-10-CM

## 2024-06-20 DIAGNOSIS — Z8619 Personal history of other infectious and parasitic diseases: Secondary | ICD-10-CM

## 2024-06-20 DIAGNOSIS — K219 Gastro-esophageal reflux disease without esophagitis: Secondary | ICD-10-CM | POA: Diagnosis not present

## 2024-06-20 DIAGNOSIS — R197 Diarrhea, unspecified: Secondary | ICD-10-CM

## 2024-06-20 DIAGNOSIS — K760 Fatty (change of) liver, not elsewhere classified: Secondary | ICD-10-CM

## 2024-06-20 DIAGNOSIS — R109 Unspecified abdominal pain: Secondary | ICD-10-CM | POA: Diagnosis not present

## 2024-06-20 DIAGNOSIS — Z83719 Family history of colon polyps, unspecified: Secondary | ICD-10-CM

## 2024-06-20 DIAGNOSIS — Z800 Family history of malignant neoplasm of digestive organs: Secondary | ICD-10-CM

## 2024-06-20 LAB — CBC WITH DIFFERENTIAL/PLATELET
Basophils Absolute: 0 10*3/uL (ref 0.0–0.1)
Basophils Relative: 0.6 % (ref 0.0–3.0)
Eosinophils Absolute: 0.2 10*3/uL (ref 0.0–0.7)
Eosinophils Relative: 2.9 % (ref 0.0–5.0)
HCT: 46.9 % (ref 39.0–52.0)
Hemoglobin: 16.3 g/dL (ref 13.0–17.0)
Lymphocytes Relative: 26.8 % (ref 12.0–46.0)
Lymphs Abs: 2.1 10*3/uL (ref 0.7–4.0)
MCHC: 34.7 g/dL (ref 30.0–36.0)
MCV: 86.6 fl (ref 78.0–100.0)
Monocytes Absolute: 0.7 10*3/uL (ref 0.1–1.0)
Monocytes Relative: 8.5 % (ref 3.0–12.0)
Neutro Abs: 4.8 10*3/uL (ref 1.4–7.7)
Neutrophils Relative %: 61.2 % (ref 43.0–77.0)
Platelets: 279 10*3/uL (ref 150.0–400.0)
RBC: 5.42 Mil/uL (ref 4.22–5.81)
RDW: 13.7 % (ref 11.5–15.5)
WBC: 7.9 10*3/uL (ref 4.0–10.5)

## 2024-06-20 LAB — TSH: TSH: 0.59 u[IU]/mL (ref 0.35–5.50)

## 2024-06-20 LAB — COMPREHENSIVE METABOLIC PANEL WITH GFR
ALT: 24 U/L (ref 0–53)
AST: 20 U/L (ref 0–37)
Albumin: 4.7 g/dL (ref 3.5–5.2)
Alkaline Phosphatase: 56 U/L (ref 39–117)
BUN: 23 mg/dL (ref 6–23)
CO2: 26 meq/L (ref 19–32)
Calcium: 9.7 mg/dL (ref 8.4–10.5)
Chloride: 100 meq/L (ref 96–112)
Creatinine, Ser: 0.97 mg/dL (ref 0.40–1.50)
GFR: 90.94 mL/min (ref >60.00)
Glucose, Bld: 111 mg/dL — ABNORMAL HIGH (ref 70–99)
Potassium: 4.5 meq/L (ref 3.5–5.1)
Sodium: 134 meq/L — ABNORMAL LOW (ref 135–145)
Total Bilirubin: 0.9 mg/dL (ref 0.2–1.2)
Total Protein: 7.4 g/dL (ref 6.0–8.3)

## 2024-06-20 LAB — C-REACTIVE PROTEIN: CRP: <1 mg/dL (ref 0.5–20.0)

## 2024-06-20 LAB — SEDIMENTATION RATE: Sed Rate: 3 mm/h (ref 0–20)

## 2024-06-20 MED ORDER — FAMOTIDINE 40 MG PO TABS: 40.0000 mg | ORAL_TABLET | Freq: Every day | ORAL | 2 refills | Status: AC

## 2024-06-20 MED ORDER — DICYCLOMINE HCL 20 MG PO TABS
20.0000 mg | ORAL_TABLET | Freq: Three times a day (TID) | ORAL | 0 refills | Status: AC | PRN
Start: 2024-06-20 — End: ?

## 2024-06-20 NOTE — Progress Notes (Signed)
 06/20/2024 Jordan Buchanan [id number] 1971-04-20  Referring provider: Rilla Baller, MD Primary GI doctor: Dr. Shila  ASSESSMENT AND PLAN:   Abdominal pain and diarrhea  With history of C. difficile infection 2017 and 2018 with history of diverticulitis with a perforation s/p diagnostic laparoscopy with washout, drainage of abscess with drain placement 03/2020 and subsequently underwent a low anterior rectosigmoid resection 05/2020 2 weeks of AB discomfort, gas, GERD, nausea without vomiting, has had diarrhea at least 20 x yesterday No hematochezia, fever, chills,  A month ago started potassium pill, otherwise no new medications, no ABX last 6 months, no sick contacts, no traveling, pepto helped.  Worsening diarrhea and abdominal discomfort with possible infection, medication-induced gastroenteritis, or small intestinal bacterial overgrowth. Meloxicam  and turmeric may contribute to symptoms. - Order stool studies to rule out infection and C. Difficile, diatherix. - Order blood tests for inflammation and infection. - Discontinue meloxicam . - Continue pantoprazole  in the morning. Add Pepcid  at night. - Consider CT scan if symptoms worsen or labs indicate inflammation. - Discuss evaluating for small intestinal bacterial overgrowth or microscopic colitis if symptoms persist. - Advise to continue Pepto-Bismol for diarrhea, prescribe dicyclomine. - Educate on fiber supplements, recommend Benefiber or Citrucel if Metamucil causes gas.  GERD and history of dysphagia Status post cholecystectomy 04/2020 EGD 08/2018 reflux esophagitis and negative HP gastritis status post dilation On protonix  40 mg daily and having worsening GERD x 1 week No ETOH, on mobic  15 mg daily x 2 months and on tumeric Recent heartburn despite pantoprazole , possibly exacerbated by meloxicam  and turmeric. - Continue pantoprazole  in the morning. - Add Pepcid  at night. - Discontinue meloxicam . - Advise to take  turmeric with food.  Hepatic steatosis    Latest Ref Rng & Units 12/26/2023   10:36 AM 05/13/2023    7:48 AM 05/03/2022    7:30 AM  Hepatic Function  Total Protein 6.0 - 8.3 g/dL 7.5  6.4  6.9   Albumin 3.5 - 5.2 g/dL 4.7  3.9  4.1   AST 0 - 37 U/L 17  23  21    ALT 0 - 53 U/L 23  33  33   Alk Phosphatase 39 - 117 U/L 71  67  66   Total Bilirubin 0.2 - 1.2 mg/dL 1.1  1.2  0.7   Bilirubin, Direct 0.0 - 0.3 mg/dL   0.1    Platelets 704.9  FIB 4 0.60 - need LFTs and CBC monitored every 6 months, - evaluation with imaging every 2-3 years.  -Continue to work on risk factor modification including diet exercise and control of risk factors including blood sugars.  Colon cancer screening.  Father with history of colon polyps. Normal colonoscopy 05/2020.   -Next screening colonoscopy due 05/2025.   Patient Care Team: Rilla Baller, MD as PCP - General (Family Medicine) [place], [name], MD as PCP - Cardiology (Cardiology) Aundria Ladell POUR, MD as Consulting Physician (Gastroenterology) Sheldon Standing, MD as Consulting Physician (General Surgery)  HISTORY OF PRESENT ILLNESS: 53 y.o. male with a past medical history listed below presents for evaluation of AB pain and diarrhea.  Last seen in the office 12/26/2023 by  Nyle Sharps, NP for left upper quadrant pain.  Discussed the use of AI scribe software for clinical note transcription with the patient, who gave verbal consent to proceed.  History of Present Illness   Jordan Buchanan is a 53 year old male with diverticulitis and C. diff who presents with worsening abdominal symptoms  and diarrhea.  He has been experiencing abdominal discomfort for approximately two weeks, initially with increased stomach noises, gurgling, and excessive gas. These symptoms have progressed to frequent diarrhea, with up to 20 episodes in a single day, worsening over the past two weeks.  His medical history includes diverticulitis with perforation, for which he  underwent a lower anterior rectosigmoid resection in 2021, resulting in a 30-day hospitalization. He also has a history of C. diff infections in 2017 and 2018, and recalls having C. diff and salmonella simultaneously, which he finds reminiscent of his current symptoms.  He has been taking a fiber supplement daily since his colon issues, which he found beneficial in maintaining regularity. However, due to his current symptoms, he is hesitant to continue the supplement. He has been using Pepto-Bismol to manage his symptoms, which provides temporary relief but causes his stools to darken.  He reports new onset heartburn over the past week, despite being on Protonix , which previously controlled his symptoms. He also experiences abdominal cramping and occasional nausea, though he has not vomited. No fever, chills, or recent illness in his contacts.  His medication regimen includes Norvasc , Mobic  (meloxicam ) daily for the past two months, and turmeric for the past three years. He recently added a potassium supplement to his vitamin regimen about a month ago. He denies any recent changes in other medications or antibiotic use in the past six months.  He has been actively losing weight since July of the previous year, with a total loss of about 60 pounds, although he notes a slight weight gain in the past few weeks. He drinks bottled water most of the time and quit alcohol in 2002. He denies any recent travel or exposure to sick contacts.      He  reports that he quit smoking about 24 years ago. His smoking use included cigarettes. He started smoking about 37 years ago. He has a 13 pack-year smoking history. He quit smokeless tobacco use about 10 years ago.  His smokeless tobacco use included snuff. He reports that he does not currently use drugs after having used the following drugs: Marijuana. He reports that he does not drink alcohol.  RELEVANT GI HISTORY, IMAGING AND LABS: Results         EGD  09/01/2018:    Colonoscopy 06/16/2020 by Dr. Tye: Sigmoid stricture  Internal hemorrhoids  No polyps    CBC    Component Value Date/Time   WBC 9.7 12/26/2023 1036   RBC 5.45 12/26/2023 1036   HGB 16.5 12/26/2023 1036   HCT 48.3 12/26/2023 1036   PLT 295.0 12/26/2023 1036   MCV 88.6 12/26/2023 1036   MCH 30.1 05/04/2023 0745   MCHC 34.2 12/26/2023 1036   RDW 14.0 12/26/2023 1036   LYMPHSABS 1.9 12/26/2023 1036   MONOABS 0.7 12/26/2023 1036   EOSABS 0.2 12/26/2023 1036   BASOSABS 0.0 12/26/2023 1036   Recent Labs    12/26/23 1036  HGB 16.5    CMP     Component Value Date/Time   NA 136 12/26/2023 1036   K 4.6 12/26/2023 1036   CL 100 12/26/2023 1036   CO2 27 12/26/2023 1036   GLUCOSE 106 (H) 12/26/2023 1036   BUN 16 12/26/2023 1036   CREATININE 0.91 12/26/2023 1036   CALCIUM 9.7 12/26/2023 1036   PROT 7.5 12/26/2023 1036   ALBUMIN 4.7 12/26/2023 1036   AST 17 12/26/2023 1036   ALT 23 12/26/2023 1036   ALKPHOS 71 12/26/2023 1036  BILITOT 1.1 12/26/2023 1036   GFRNONAA >60 05/04/2023 0745   GFRAA >60 06/18/2020 0506      Latest Ref Rng & Units 12/26/2023   10:36 AM 05/13/2023    7:48 AM 05/03/2022    7:30 AM  Hepatic Function  Total Protein 6.0 - 8.3 g/dL 7.5  6.4  6.9   Albumin 3.5 - 5.2 g/dL 4.7  3.9  4.1   AST 0 - 37 U/L 17  23  21    ALT 0 - 53 U/L 23  33  33   Alk Phosphatase 39 - 117 U/L 71  67  66   Total Bilirubin 0.2 - 1.2 mg/dL 1.1  1.2  0.7   Bilirubin, Direct 0.0 - 0.3 mg/dL   0.1       Current Medications:    Current Outpatient Medications (Cardiovascular):    amLODipine  (NORVASC ) 10 MG tablet, Take 1 tablet (10 mg total) by mouth daily.   metoprolol  succinate (TOPROL -XL) 50 MG 24 hr tablet, Take 1 tablet (50 mg total) by mouth daily. TAKE WITH OR IMMEDIATELY FOLLOWING A MEAL.  Current Outpatient Medications (Respiratory):    albuterol  (VENTOLIN  HFA) 108 (90 Base) MCG/ACT inhaler, Inhale 2 puffs into the lungs every 4 (four) hours as  needed for wheezing or shortness of breath. Rescue inhaler   budesonide -formoterol  (SYMBICORT ) 160-4.5 MCG/ACT inhaler, INHALE 1-2 PUFFS INTO THE LUNGS IN THE MORNING AND AT BEDTIME  Current Outpatient Medications (Analgesics):    meloxicam  (MOBIC ) 15 MG tablet, TAKE 1 TABLET (15 MG TOTAL) BY MOUTH DAILY.   Current Outpatient Medications (Other):    dicyclomine (BENTYL) 20 MG tablet, Take 1 tablet (20 mg total) by mouth 3 (three) times daily as needed for spasms.   famotidine  (PEPCID ) 40 MG tablet, Take 1 tablet (40 mg total) by mouth at bedtime.   Omega-3 Fatty Acids (FISH OIL ) 1000 MG CAPS, Take 1 capsule (1,000 mg total) by mouth daily.   pantoprazole  (PROTONIX ) 40 MG tablet, Take 1 tablet (40 mg total) by mouth 2 (two) times daily.   sucralfate  (CARAFATE ) 1 g tablet, Take 1 g by mouth as needed (GI issues).   Turmeric 500 MG CAPS, Take 2 capsules by mouth daily.  Medical History:  Past Medical History:  Diagnosis Date   Acute calculous cholecystitis s/p lap cholecystectomy 04/30/2020 04/30/2020   Arrhythmia    PALPITATIONS AND PVC - improved with cutting down on caffeine   Cough variant asthma 05/28/2014   05/27/2014 p extensive coaching HFA effectiveness =    75% > try dulera 100 2bid  - PFT's 07/03/2014 FEV1 4.15 (98%) ratio 89 and no chang p B2 and nl fef25-75  And nl dlco     COVID-19 virus infection 11/2020   Diverticulitis of large intestine with perforation and abscess s/p lap drainage & washout 04/30/2020 04/13/2020   Diverticulosis 07/2015   by CT scan   Dysplastic nevus 05/15/2020   Left lat. base of neck. Moderate atypia, lateral margin involved.    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 Dolphus)    Tremor    Urticaria    Allergies:  Allergies  Allergen Reactions   Hctz [Hydrochlorothiazide ] Rash  and Other (See Comments)    Per derm rec stay off HCTZ (2018)     Surgical History:  He  has a past surgical history that includes Rotator cuff repair (Left, 2009); Other surgical history (1992); Umbilical hernia repair (05/10/2012); Nasal reconstruction with septal repair (2017); Sinoscopy; Esophagogastroduodenoscopy (08/2018); Cholecystectomy (N/A, 04/30/2020); laparoscopy (N/A, 04/30/2020); Proctoscopy (N/A, 06/17/2020); ROBOTIC LOW ANTERIOR RECTOSIGMOID RESECTION (06/17/2020); and Shoulder arthroscopy w/ labral repair (Right, 2019). Family History:  His family history includes Allergies in his father; Arthritis in his father; Asthma in his father; Breast cancer in his mother; CAD in his paternal grandfather; Chronic bronchitis in his father; Colon polyps in his father; Diabetes in his brother; Hyperlipidemia in his mother; Hypertension in his brother; Multiple sclerosis in his brother; Other in his paternal grandmother.  REVIEW OF SYSTEMS  : All other systems reviewed and negative except where noted in the History of Present Illness.  PHYSICAL EXAM: BP (!) 150/88   Pulse 82   Ht 5' 10 (1.778 m)   Wt 222 lb (100.7 kg)   BMI 31.85 kg/m  Physical Exam   GENERAL APPEARANCE: Well nourished, in no apparent distress. HEENT: No cervical lymphadenopathy, unremarkable thyroid , sclerae anicteric, conjunctiva pink. RESPIRATORY: Respiratory effort normal, breath sounds equal bilaterally without rales, rhonchi, or wheezing. CARDIO: Regular rate and rhythm with no murmurs, rubs, or gallops, peripheral pulses intact. ABDOMEN: Soft, non-distended, active bowel sounds in all four quadrants, no tenderness to palpation, no rebound, no mass appreciated. RECTAL: Declines. MUSCULOSKELETAL: Full range of motion, normal gait, without edema. SKIN: Dry, intact without rashes or lesions. No jaundice. NEURO:  Alert, oriented, no focal deficits. PSYCH: Cooperative, normal mood and affect.      Alan JONELLE Coombs, PA-C 9:20 AM

## 2024-06-20 NOTE — Patient Instructions (Addendum)
 Your provider has requested that you go to the basement level for lab work before leaving today. Press B on the elevator. The lab is located at the first door on the left as you exit the elevator.  Due to recent changes in healthcare laws, you may see the results of your imaging and laboratory studies on MyChart before your provider has had a chance to review them.  We understand that in some cases there may be results that are confusing or concerning to you. Not all laboratory results come back in the same time frame and the provider may be waiting for multiple results in order to interpret others.  Please give us  48 hours in order for your provider to thoroughly review all the results before contacting the office for clarification of your results.    We are ordering a Diatherix stool testing for you to take home and complete.  You have received a kit from our office today containing all necessary supplies to complete this test.  Please carefully read the stool collection instructions provided in the kit before opening the accompanying materials  OR an easier way is to please use toilet paper to wipe after your bowel movement and use the qtip/applicator provided to get a small volume of the stool from the toilet paper and place that in the tube.   Important to remember: -Place the label onto the puritan opti-swab TUBE. -This label should include your full name and date of birth.  - This label should have the DATE AND TIME stool was collects -After completing the test, you should secure the tube into the specimen biohazard bag.  -The laboratory request information sheet (including date and time of specimen collection) should be placed into the outside pocket of the specimen biohazard bag and returned to the [facility] lab with 2 days of collection.   If it is greater than two days from collection you will be asked to repeat the test.  If the label is missing from the tube with your name, date of  birth, date and time of collection on it, you will have to repeat the test.  Any questions please message us  on my chart or call the office at [phone]   No aleve , ibuprofen , goody powders, as these are antiinflammatories and can cause inflammation in your stomach, increase bleeding risk and cause ulcers.  You can talk with PCP about alternative pain options.  Can do tyelnol max 3000 mg a day, salon pas patches are over the counter and voltern gel is topical antiinflammatory that is safe.   Continue the protonix  in the morning Will add on pepcid  at night to take before bed Avoid spicy and acidic foods Avoid fatty foods Limit your intake of coffee, tea, alcohol, and carbonated drinks Work to maintain a healthy weight Keep the head of the bed elevated at least 3 inches with blocks or a wedge pillow if you are having any nighttime symptoms Stay upright for 2 hours after eating Avoid meals and snacks three to four hours before bedtime  - bismuth salicylate (Pepto-Bismol) 30 mL or two tablets every 30 minutes for eight doses. Pepto-Bismol may make your stools black.   Go to the ER if any severe abdominal pain, fever, or weakness   - Drink a lot of liquids that have water, salt, and sugar. Good choices are water mixed with juice, flavored soda, and soup broth. If you are drinking enough, your urine will be light yellow or almost clear.  -  Try to eat a little food. Good choices are potatoes, noodles, rice, oatmeal, crackers, bananas, soup, and boiled vegetables.  - Avoid high fat foods, as they can make diarrhea worse.  - Dairy products (except yogurt) may be difficult to digest when you have diarrhea. I recommend that you temporarily avoid lactose-containing foods.    Thank you for trusting me with your gastrointestinal care!   Alan Coombs, PA-C   _______________________________________________________  If your blood pressure at your visit was 140/90 or greater, please contact  your primary care physician to follow up on this.  _______________________________________________________  If you are age 28 or older, your body mass index should be between 23-30. Your Body mass index is 31.85 kg/m. If this is out of the aforementioned range listed, please consider follow up with your Primary Care Provider.  If you are age 30 or younger, your body mass index should be between 19-25. Your Body mass index is 31.85 kg/m. If this is out of the aformentioned range listed, please consider follow up with your Primary Care Provider.   ________________________________________________________  The [facility] providers would like to encourage you to use MYCHART to communicate with providers for non-urgent requests or questions.  Due to long hold times on the telephone, sending your provider a message by [facility] may be a faster and more efficient way to get a response.  Please allow 48 business hours for a response.  Please remember that this is for non-urgent requests.  _______________________________________________________    .

## 2024-06-25 NOTE — Telephone Encounter (Signed)
 Negative Diatherix stools

## 2024-06-29 ENCOUNTER — Encounter: Payer: Self-pay | Admitting: Physician Assistant

## 2024-07-01 ENCOUNTER — Other Ambulatory Visit: Payer: Self-pay | Admitting: Family Medicine

## 2024-07-01 DIAGNOSIS — I10 Essential (primary) hypertension: Secondary | ICD-10-CM

## 2024-07-01 DIAGNOSIS — Z125 Encounter for screening for malignant neoplasm of prostate: Secondary | ICD-10-CM

## 2024-07-03 ENCOUNTER — Other Ambulatory Visit

## 2024-07-04 ENCOUNTER — Other Ambulatory Visit (INDEPENDENT_AMBULATORY_CARE_PROVIDER_SITE_OTHER)

## 2024-07-04 DIAGNOSIS — Z125 Encounter for screening for malignant neoplasm of prostate: Secondary | ICD-10-CM

## 2024-07-04 LAB — PSA: PSA: 0.51 ng/mL (ref 0.10–4.00)

## 2024-07-04 LAB — LIPID PANEL
Cholesterol: 178 mg/dL (ref 0–200)
HDL: 47.2 mg/dL (ref >39.00)
LDL Cholesterol: 108 mg/dL — ABNORMAL HIGH (ref 0–99)
NonHDL: 130.47
Total CHOL/HDL Ratio: 4
Triglycerides: 113 mg/dL (ref 0.0–149.0)
VLDL: 22.6 mg/dL (ref 0.0–40.0)

## 2024-07-05 ENCOUNTER — Ambulatory Visit: Payer: Self-pay | Admitting: Family Medicine

## 2024-07-10 ENCOUNTER — Ambulatory Visit (INDEPENDENT_AMBULATORY_CARE_PROVIDER_SITE_OTHER): Admitting: Family Medicine

## 2024-07-10 ENCOUNTER — Encounter: Payer: Self-pay | Admitting: Family Medicine

## 2024-07-10 VITALS — BP 138/80 | HR 73 | Temp 98.6°F | Ht 69.5 in | Wt 220.0 lb

## 2024-07-10 DIAGNOSIS — Z0000 Encounter for general adult medical examination without abnormal findings: Secondary | ICD-10-CM | POA: Diagnosis not present

## 2024-07-10 DIAGNOSIS — I10 Essential (primary) hypertension: Secondary | ICD-10-CM

## 2024-07-10 DIAGNOSIS — E785 Hyperlipidemia, unspecified: Secondary | ICD-10-CM | POA: Insufficient documentation

## 2024-07-10 DIAGNOSIS — Z23 Encounter for immunization: Secondary | ICD-10-CM | POA: Diagnosis not present

## 2024-07-10 DIAGNOSIS — K219 Gastro-esophageal reflux disease without esophagitis: Secondary | ICD-10-CM

## 2024-07-10 DIAGNOSIS — E66811 Obesity, class 1: Secondary | ICD-10-CM

## 2024-07-10 DIAGNOSIS — J45991 Cough variant asthma: Secondary | ICD-10-CM

## 2024-07-10 DIAGNOSIS — E7800 Pure hypercholesterolemia, unspecified: Secondary | ICD-10-CM

## 2024-07-10 MED ORDER — METOPROLOL SUCCINATE ER 50 MG PO TB24: 50.0000 mg | ORAL_TABLET | Freq: Every day | ORAL | 4 refills | Status: AC

## 2024-07-10 MED ORDER — AMLODIPINE BESYLATE 10 MG PO TABS: 10.0000 mg | ORAL_TABLET | Freq: Every day | ORAL | 4 refills | Status: AC

## 2024-07-10 MED ORDER — PANTOPRAZOLE SODIUM 40 MG PO TBEC: 40.0000 mg | DELAYED_RELEASE_TABLET | Freq: Every day | ORAL | 4 refills | Status: AC

## 2024-07-10 NOTE — Progress Notes (Signed)
 Ph: [phone] Fax: [phone]   Patient ID: Jordan Buchanan, male    DOB: 1971/09/03, 53 y.o.   MRN: [id number]  This visit was conducted in person.  BP 138/80   Pulse 73   Temp 98.6 F (37 C) (Oral)   Ht 5' 9.5 (1.765 m)   Wt 220 lb (99.8 kg)   SpO2 98%   BMI 32.02 kg/m    CC: CPE Subjective:   HPI: Jordan Buchanan is a 53 y.o. male presenting on 07/10/2024 for Annual Exam   36 lbs down since last physical 04/2023. He has been working on diet and lifestyle. Cut out sugar, more regular at the gym, increased protein intake. Overall low carb diet.   2 wk h/o L posterior hip pain with radiation down posterior knee to foot, worse when supine. No lower back pain, no leg swelling or redness.   Seen earlier in the month by [facility] with GI upset, diarrhea - with reassuring evaluation. This resolved. He had been taking meloxicam  for L foot pain / pulled muscle - with benefit but it may have upset stomach.   GERD - h/o esophageal dilation 2019 [facility]). Established with [facility]. Currently taking pantoprazole  in am and pepcid  in evening. Has carafate  for PRN use.    Saw Dr McQuaid 01/2023 for multifactorial cough, cough variant asthma - managed with PPI BID. H/o asthmatic bronchitis. Continues symbicort  inhaler PRN - no recent need.   Coronary CTA with calcium score of zero (06/2019).   Prolonged hospitalization summer 2021 for perforated diverticulitis complicated by acute cholecystitis s/p cholecystectomy. S/p diverticulitis surgery s/p robotic rectosigmoid resection LAR 05/2020.   Preventative: Colonoscopy WNL (Isami Tye) - done prior to diverticulitis surgery 2021 Saw [facility] Dr Aundria 2022. Now seeing [facility] after recent move. Planning colonoscopy 05/2025 Prostate cancer screen - no fmhx Lung cancer screen - not eligible  Flu shot yearly COVID vaccine Pfizer 02/2020, 03/2020, no booster Tdap 2019 Prevnar -20 - discussed, interested with h/o prior pneumonia  Shingrix  - discussed,  to consider  Seat belt use discussed Sunscreen use discussed. No changing moles on skin. Sees derm regularly.  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 Interim medical history since our last visit reviewed. Allergies and medications reviewed and updated. Outpatient Medications Prior to Visit  Medication Sig Dispense Refill   albuterol  (VENTOLIN  HFA) 108 (90 Base) MCG/ACT inhaler Inhale 2 puffs into the lungs every 4 (four) hours as needed for wheezing or shortness of breath. Rescue inhaler 8 each 4   budesonide -formoterol  (SYMBICORT ) 160-4.5 MCG/ACT inhaler INHALE 1-2 PUFFS INTO THE LUNGS IN THE MORNING AND AT BEDTIME 10.2 each 6   dicyclomine  (BENTYL ) 20 MG tablet Take 1 tablet (20 mg total) by mouth 3 (three) times daily as needed for spasms. 50 tablet 0   famotidine  (PEPCID ) 40 MG tablet Take 1 tablet (40 mg total) by mouth at bedtime. 30 tablet 2   Omega-3 Fatty Acids (FISH OIL ) 1000 MG CAPS Take 1 capsule (1,000 mg total) by mouth daily.  0   sucralfate  (  CARAFATE ) 1 g tablet Take 1 g by mouth as needed (GI issues).     Turmeric 500 MG CAPS Take 2 capsules by mouth daily.     amLODipine  (NORVASC ) 10 MG tablet Take 1 tablet (10 mg total) by mouth daily. 90 tablet 4   metoprolol  succinate (TOPROL -XL) 50 MG 24 hr tablet Take 1 tablet (50 mg total) by mouth daily. TAKE WITH OR IMMEDIATELY FOLLOWING A MEAL. 90 tablet 4   pantoprazole  (PROTONIX ) 40 MG tablet Take 1 tablet (40 mg total) by mouth 2 (two) times daily. 180 tablet 0   meloxicam  (MOBIC ) 15 MG tablet TAKE 1 TABLET (15 MG TOTAL) BY MOUTH DAILY.  (Patient not taking: Reported on 07/10/2024) 60 tablet 1   No facility-administered medications prior to visit.     Per HPI unless specifically indicated in ROS section below Review of Systems  Constitutional:  Negative for activity change, appetite change, chills, fatigue, fever and unexpected weight change.  HENT:  Negative for hearing loss.   Eyes:  Negative for visual disturbance.  Respiratory:  Negative for cough, chest tightness, shortness of breath and wheezing.   Cardiovascular:  Negative for chest pain, palpitations and leg swelling.  Gastrointestinal:  Positive for abdominal pain and diarrhea (recent illness, now better). Negative for abdominal distention, blood in stool, constipation, nausea and vomiting.  Genitourinary:  Negative for difficulty urinating and hematuria.  Musculoskeletal:  Negative for arthralgias, myalgias and neck pain.  Skin:  Negative for rash.  Neurological:  Negative for dizziness, seizures, syncope and headaches.  Hematological:  Negative for adenopathy. Does not bruise/bleed easily.  Psychiatric/Behavioral:  Negative for dysphoric mood. The patient is not nervous/anxious.     Objective:  BP 138/80   Pulse 73   Temp 98.6 F (37 C) (Oral)   Ht 5' 9.5 (1.765 m)   Wt 220 lb (99.8 kg)   SpO2 98%   BMI 32.02 kg/m   Wt Readings from Last 3 Encounters:  07/10/24 220 lb (99.8 kg)  06/20/24 222 lb (100.7 kg)  12/26/23 231 lb 4 oz (104.9 kg)      Physical Exam Vitals and nursing note reviewed.  Constitutional:      General: He is not in acute distress.    Appearance: Normal appearance. He is well-developed. He is not ill-appearing.  HENT:     Head: Normocephalic and atraumatic.     Right Ear: Hearing, tympanic membrane, ear canal and external ear normal.     Left Ear: Hearing, tympanic membrane, ear canal and external ear normal.     Mouth/Throat:     Mouth: Mucous membranes are moist.     Pharynx: Oropharynx is clear. No oropharyngeal exudate  or posterior oropharyngeal erythema.  Eyes:     General: No scleral icterus.    Extraocular Movements: Extraocular movements intact.     Conjunctiva/sclera: Conjunctivae normal.     Pupils: Pupils are equal, round, and reactive to light.  Neck:     Thyroid : No thyroid  mass or thyromegaly.  Cardiovascular:     Rate and Rhythm: Normal rate and regular rhythm.     Pulses: Normal pulses.          Radial pulses are 2+ on the right side and 2+ on the left side.     Heart sounds: Normal heart sounds. No murmur heard. Pulmonary:     Effort: Pulmonary effort is normal. No respiratory distress.     Breath sounds: Normal breath sounds. No wheezing, rhonchi or rales.  Abdominal:     General: Bowel sounds are normal. There is no distension.     Palpations: Abdomen is soft. There is no mass.     Tenderness: There is no abdominal tenderness. There is no guarding or rebound.     Hernia: No hernia is present.  Musculoskeletal:        General: Normal range of motion.     Cervical back: Normal range of motion and neck supple.     Right lower leg: No edema.     Left lower leg: No edema.  Lymphadenopathy:     Cervical: No cervical adenopathy.  Skin:    General: Skin is warm and dry.     Findings: No rash.  Neurological:     General: No focal deficit present.     Mental Status: He is alert and oriented to person, place, and time.  Psychiatric:        Mood and Affect: Mood normal.        Behavior: Behavior normal.        Thought Content: Thought content normal.        Judgment: Judgment normal.       Results for orders placed or performed in visit on 07/04/24  PSA   Collection Time: 07/04/24  9:29 AM  Result Value Ref Range   PSA 0.51 0.10 - 4.00 ng/mL  Lipid panel   Collection Time: 07/04/24  9:29 AM  Result Value Ref Range   Cholesterol 178 0 - 200 mg/dL   Triglycerides 886.9 0.0 - 149.0 mg/dL   HDL 52.79 >60.99 mg/dL   VLDL 77.3 0.0 - 59.9 mg/dL   LDL Cholesterol 891 (H) 0 - 99  mg/dL   Total CHOL/HDL Ratio 4    NonHDL 130.47     Assessment & Plan:   Problem List Items Addressed This Visit     Health maintenance examination - Primary (Chronic)   Preventative protocols reviewed and updated unless pt declined. Discussed healthy diet and lifestyle.       Hypertension   Chronic, stable on current regimen - continue.       Relevant Medications   amLODipine  (NORVASC ) 10 MG tablet   metoprolol  succinate (TOPROL -XL) 50 MG 24 hr tablet   GERD (gastroesophageal reflux disease)   Chronic stable followed by GI - currently taking pantoprazole  in am and pepcid  in evening.       Relevant Medications   pantoprazole  (PROTONIX ) 40 MG tablet   Cough variant asthma    Stable period with PRN symbicort  and albuterol  - no recent need      Obesity, [name], BMI 30-34.9   Congratulated on weight loss to date - 36 lbs! Pt motivated to continue healthy diet and lifestyle choices for sustainable weight loss.       Hyperlipidemia   Deteriorated, not on medication, ?related to diet changes from this year  Reviewed diet choices to improve cholesterol control.  No strong fmhx CAD. Did have strokes in father and grandmother at age 86-80s.  The 10-year ASCVD risk score (Arnett DK, et al., 2019) is: 7.9%   Values used to calculate the score:     Age: 61 years     Clincally relevant sex: Male     Is Non-Hispanic African American: No     Diabetic: Yes     Tobacco smoker: No     Systolic Blood Pressure: 138 mmHg     Is BP treated: Yes     HDL Cholesterol:  47.2 mg/dL     Total Cholesterol: 178 mg/dL       Relevant Medications   amLODipine  (NORVASC ) 10 MG tablet   metoprolol  succinate (TOPROL -XL) 50 MG 24 hr tablet   Other Visit Diagnoses       Need for vaccination against Streptococcus pneumoniae       Relevant Orders   Pneumococcal conjugate vaccine 20-valent (Prevnar 20) (Completed)        Meds ordered this encounter  Medications   amLODipine  (NORVASC ) 10 MG  tablet    Sig: Take 1 tablet (10 mg total) by mouth daily.    Dispense:  90 tablet    Refill:  4   metoprolol  succinate (TOPROL -XL) 50 MG 24 hr tablet    Sig: Take 1 tablet (50 mg total) by mouth daily. TAKE WITH OR IMMEDIATELY FOLLOWING A MEAL.    Dispense:  90 tablet    Refill:  4   pantoprazole  (PROTONIX ) 40 MG tablet    Sig: Take 1 tablet (40 mg total) by mouth daily.    Dispense:  90 tablet    Refill:  4    Orders Placed This Encounter  Procedures   Pneumococcal conjugate vaccine 20-valent (Prevnar 20)   Varicella-zoster vaccine IM    Patient Instructions  Prevnar-20 today  First shingles shot today. Return in 2-6 months for 2nd dose.  Schedule dentist appointment.  Good to see you today.  Return as needed or in 1 year for next physical.   Follow up plan: Return in about 1 year (around 07/10/2025) for annual exam, prior fasting for blood work.  Anton Blas, MD

## 2024-07-10 NOTE — Assessment & Plan Note (Signed)
 Chronic, stable on current regimen - continue.

## 2024-07-10 NOTE — Assessment & Plan Note (Signed)
 Chronic stable followed by GI - currently taking pantoprazole  in am and pepcid  in evening.

## 2024-07-10 NOTE — Assessment & Plan Note (Signed)
 Preventative protocols reviewed and updated unless pt declined. Discussed healthy diet and lifestyle.

## 2024-07-10 NOTE — Assessment & Plan Note (Signed)
 Deteriorated, not on medication, ?related to diet changes from this year  Reviewed diet choices to improve cholesterol control.  No strong fmhx CAD. Did have strokes in father and grandmother at age 51-80s.  The 10-year ASCVD risk score (Arnett DK, et al., 2019) is: 7.9%   Values used to calculate the score:     Age: 63 years     Clincally relevant sex: Male     Is Non-Hispanic African American: No     Diabetic: Yes     Tobacco smoker: No     Systolic Blood Pressure: 138 mmHg     Is BP treated: Yes     HDL Cholesterol: 47.2 mg/dL     Total Cholesterol: 178 mg/dL

## 2024-07-10 NOTE — Patient Instructions (Addendum)
 Prevnar-20 today  First shingles shot today. Return in 2-6 months for 2nd dose.  Schedule dentist appointment.  Good to see you today.  Return as needed or in 1 year for next physical.

## 2024-07-10 NOTE — Assessment & Plan Note (Signed)
 Stable period with PRN symbicort  and albuterol  - no recent need

## 2024-07-10 NOTE — Assessment & Plan Note (Signed)
 Congratulated on weight loss to date - 36 lbs! Pt motivated to continue healthy diet and lifestyle choices for sustainable weight loss.

## 2024-07-31 ENCOUNTER — Other Ambulatory Visit: Payer: Self-pay | Admitting: Podiatry

## 2024-08-07 DIAGNOSIS — M545 Low back pain, unspecified: Secondary | ICD-10-CM | POA: Diagnosis not present

## 2024-08-07 DIAGNOSIS — M47816 Spondylosis without myelopathy or radiculopathy, lumbar region: Secondary | ICD-10-CM | POA: Diagnosis not present

## 2024-08-07 DIAGNOSIS — M25552 Pain in left hip: Secondary | ICD-10-CM | POA: Diagnosis not present

## 2024-09-11 ENCOUNTER — Ambulatory Visit

## 2024-10-09 DIAGNOSIS — J019 Acute sinusitis, unspecified: Secondary | ICD-10-CM | POA: Diagnosis not present

## 2024-11-28 DIAGNOSIS — L814 Other melanin hyperpigmentation: Secondary | ICD-10-CM | POA: Diagnosis not present

## 2024-11-28 DIAGNOSIS — D225 Melanocytic nevi of trunk: Secondary | ICD-10-CM | POA: Diagnosis not present

## 2024-11-28 DIAGNOSIS — L209 Atopic dermatitis, unspecified: Secondary | ICD-10-CM | POA: Diagnosis not present

## 2024-11-28 DIAGNOSIS — L2089 Other atopic dermatitis: Secondary | ICD-10-CM | POA: Diagnosis not present

## 2025-07-04 ENCOUNTER — Other Ambulatory Visit

## 2025-07-12 ENCOUNTER — Encounter: Admitting: Family Medicine
